# Patient Record
Sex: Female | Born: 1970
Health system: Southern US, Community
[De-identification: ages and names within clinical notes are randomized; demographics above are authoritative.]

## PROBLEM LIST (undated history)

## (undated) DIAGNOSIS — R05 Cough: Secondary | ICD-10-CM

## (undated) DIAGNOSIS — O24419 Gestational diabetes mellitus in pregnancy, unspecified control: Secondary | ICD-10-CM

## (undated) DIAGNOSIS — L509 Urticaria, unspecified: Secondary | ICD-10-CM

## (undated) DIAGNOSIS — N979 Female infertility, unspecified: Secondary | ICD-10-CM

## (undated) DIAGNOSIS — R519 Headache, unspecified: Secondary | ICD-10-CM

## (undated) DIAGNOSIS — Z800 Family history of malignant neoplasm of digestive organs: Secondary | ICD-10-CM

## (undated) DIAGNOSIS — E119 Type 2 diabetes mellitus without complications: Secondary | ICD-10-CM

## (undated) DIAGNOSIS — K589 Irritable bowel syndrome without diarrhea: Secondary | ICD-10-CM

## (undated) DIAGNOSIS — R030 Elevated blood-pressure reading, without diagnosis of hypertension: Secondary | ICD-10-CM

## (undated) DIAGNOSIS — R51 Headache: Secondary | ICD-10-CM

## (undated) DIAGNOSIS — N840 Polyp of corpus uteri: Secondary | ICD-10-CM

## (undated) DIAGNOSIS — R5383 Other fatigue: Secondary | ICD-10-CM

## (undated) DIAGNOSIS — J069 Acute upper respiratory infection, unspecified: Secondary | ICD-10-CM

## (undated) DIAGNOSIS — K219 Gastro-esophageal reflux disease without esophagitis: Secondary | ICD-10-CM

## (undated) DIAGNOSIS — T783XXA Angioneurotic edema, initial encounter: Secondary | ICD-10-CM

## (undated) DIAGNOSIS — E0500 Thyrotoxicosis with diffuse goiter without thyrotoxic crisis or storm: Secondary | ICD-10-CM

## (undated) DIAGNOSIS — T7840XA Allergy, unspecified, initial encounter: Secondary | ICD-10-CM

## (undated) DIAGNOSIS — E785 Hyperlipidemia, unspecified: Secondary | ICD-10-CM

## (undated) DIAGNOSIS — R32 Unspecified urinary incontinence: Secondary | ICD-10-CM

## (undated) DIAGNOSIS — R0602 Shortness of breath: Secondary | ICD-10-CM

## (undated) DIAGNOSIS — G4730 Sleep apnea, unspecified: Secondary | ICD-10-CM

## (undated) DIAGNOSIS — R7303 Prediabetes: Secondary | ICD-10-CM

## (undated) DIAGNOSIS — R011 Cardiac murmur, unspecified: Secondary | ICD-10-CM

## (undated) DIAGNOSIS — Z8042 Family history of malignant neoplasm of prostate: Secondary | ICD-10-CM

## (undated) DIAGNOSIS — E039 Hypothyroidism, unspecified: Secondary | ICD-10-CM

## (undated) DIAGNOSIS — Z803 Family history of malignant neoplasm of breast: Secondary | ICD-10-CM

## (undated) DIAGNOSIS — N939 Abnormal uterine and vaginal bleeding, unspecified: Secondary | ICD-10-CM

## (undated) DIAGNOSIS — C801 Malignant (primary) neoplasm, unspecified: Secondary | ICD-10-CM

## (undated) DIAGNOSIS — R059 Cough, unspecified: Secondary | ICD-10-CM

## (undated) DIAGNOSIS — J349 Unspecified disorder of nose and nasal sinuses: Secondary | ICD-10-CM

## (undated) DIAGNOSIS — R61 Generalized hyperhidrosis: Secondary | ICD-10-CM

## (undated) HISTORY — DX: Malignant (primary) neoplasm, unspecified: C80.1

## (undated) HISTORY — DX: Unspecified disorder of nose and nasal sinuses: J34.9

## (undated) HISTORY — DX: Generalized hyperhidrosis: R61

## (undated) HISTORY — DX: Hyperlipidemia, unspecified: E78.5

## (undated) HISTORY — DX: Cough: R05

## (undated) HISTORY — DX: Elevated blood-pressure reading, without diagnosis of hypertension: R03.0

## (undated) HISTORY — DX: Family history of malignant neoplasm of prostate: Z80.42

## (undated) HISTORY — DX: Other fatigue: R53.83

## (undated) HISTORY — DX: Female infertility, unspecified: N97.9

## (undated) HISTORY — DX: Family history of malignant neoplasm of breast: Z80.3

## (undated) HISTORY — DX: Gastro-esophageal reflux disease without esophagitis: K21.9

## (undated) HISTORY — DX: Angioneurotic edema, initial encounter: T78.3XXA

## (undated) HISTORY — DX: Headache: R51

## (undated) HISTORY — DX: Cough, unspecified: R05.9

## (undated) HISTORY — DX: Family history of malignant neoplasm of digestive organs: Z80.0

## (undated) HISTORY — DX: Unspecified urinary incontinence: R32

## (undated) HISTORY — DX: Type 2 diabetes mellitus without complications: E11.9

## (undated) HISTORY — DX: Shortness of breath: R06.02

## (undated) HISTORY — DX: Headache, unspecified: R51.9

## (undated) HISTORY — DX: Polyp of corpus uteri: N84.0

## (undated) HISTORY — DX: Gestational diabetes mellitus in pregnancy, unspecified control: O24.419

## (undated) HISTORY — PX: LASIK: SHX215

## (undated) HISTORY — DX: Urticaria, unspecified: L50.9

## (undated) HISTORY — DX: Thyrotoxicosis with diffuse goiter without thyrotoxic crisis or storm: E05.00

## (undated) HISTORY — DX: Cardiac murmur, unspecified: R01.1

## (undated) HISTORY — DX: Allergy, unspecified, initial encounter: T78.40XA

## (undated) HISTORY — DX: Acute upper respiratory infection, unspecified: J06.9

## (undated) HISTORY — DX: Irritable bowel syndrome, unspecified: K58.9

---

## 2007-08-04 DIAGNOSIS — L255 Unspecified contact dermatitis due to plants, except food: Secondary | ICD-10-CM | POA: Insufficient documentation

## 2008-04-25 DIAGNOSIS — J45909 Unspecified asthma, uncomplicated: Secondary | ICD-10-CM | POA: Insufficient documentation

## 2010-02-20 ENCOUNTER — Ambulatory Visit: Payer: Self-pay | Admitting: Family Medicine

## 2010-02-27 ENCOUNTER — Encounter: Admission: RE | Admit: 2010-02-27 | Discharge: 2010-02-27 | Payer: Self-pay | Admitting: Family Medicine

## 2010-02-27 IMAGING — US US SOFT TISSUE HEAD/NECK
1 series · 14 of 25 positions shown · non-contrast
Comparison: None.

CLINICAL DATA: Hyperthyroidism

THYROID ULTRASOUND
TECHNIQUE: Ultrasound examination of the thyroid gland and
adjacent soft tissues was performed.

[Series 1: us soft tissue head/neck · 0.09mm/px · 14 of 46 slices shown]
[im 1/46]
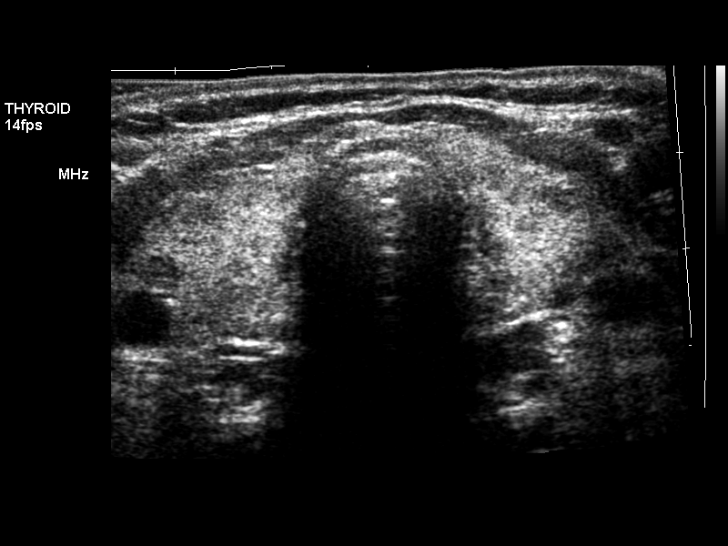
[im 4/46]
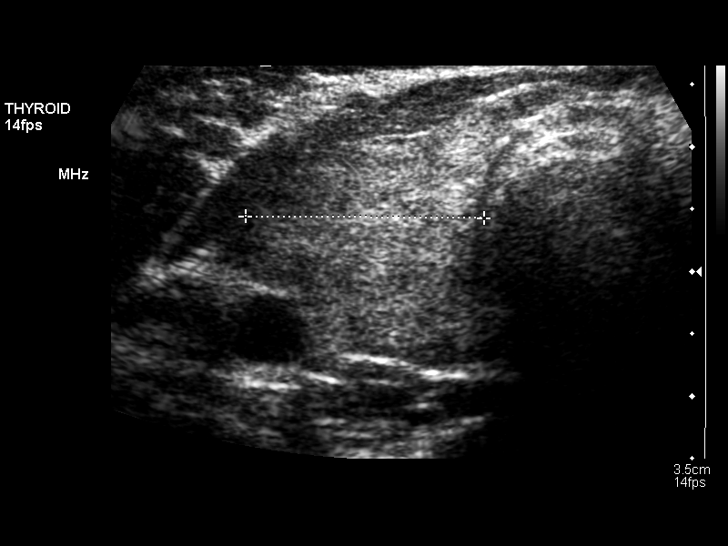
[im 8/46]
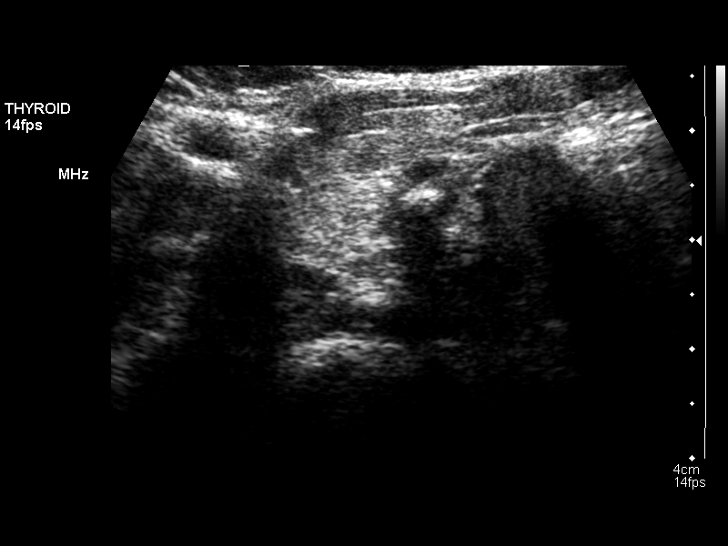
[im 12/46]
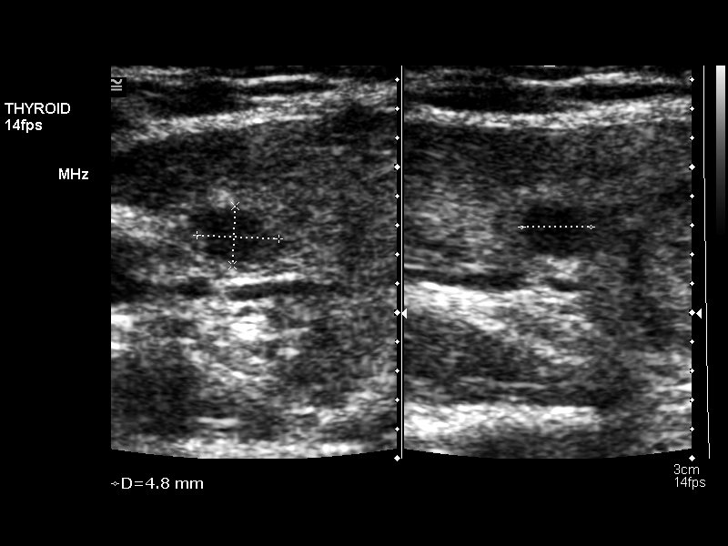
[im 16/46]
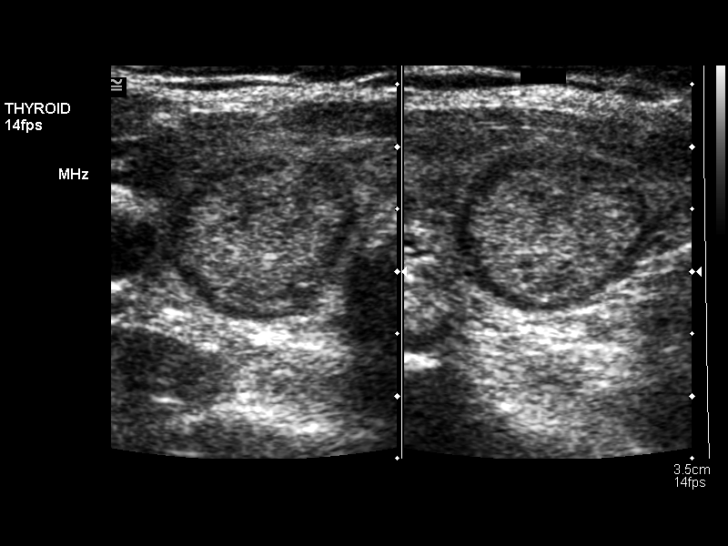
[im 17/46]
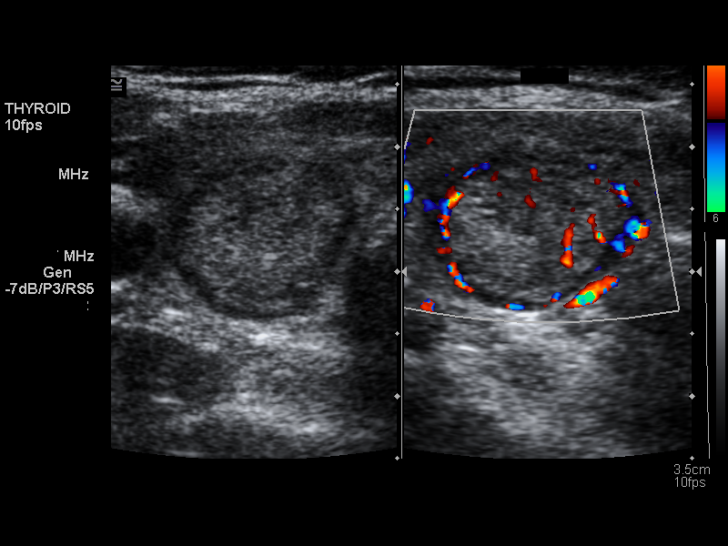
[im 21/46]
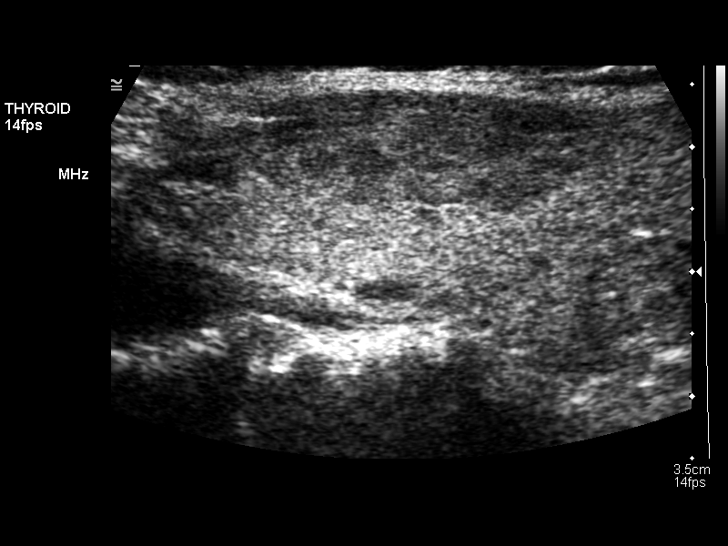
[im 25/46]
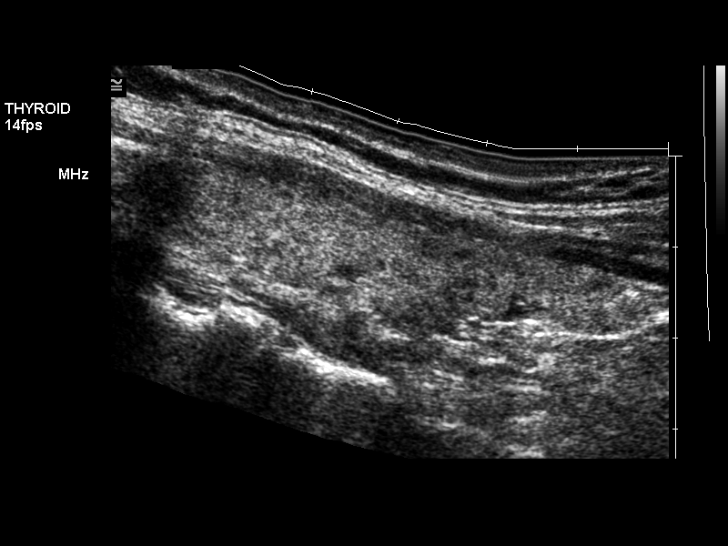
[im 29/46]
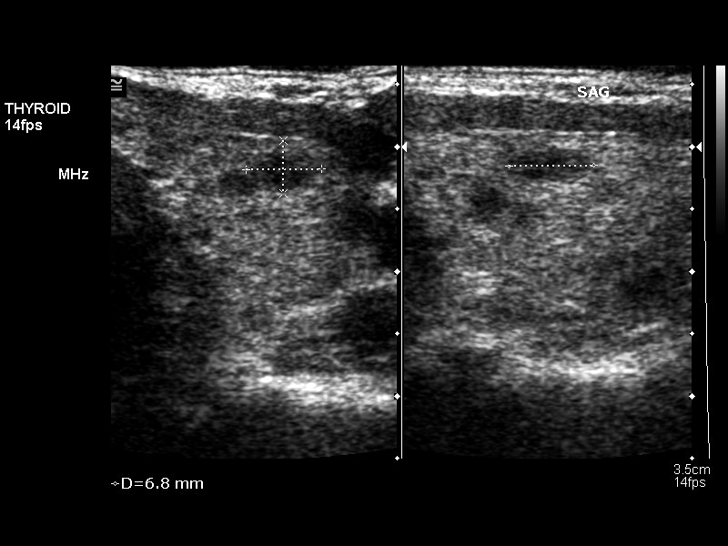
[im 31/46]
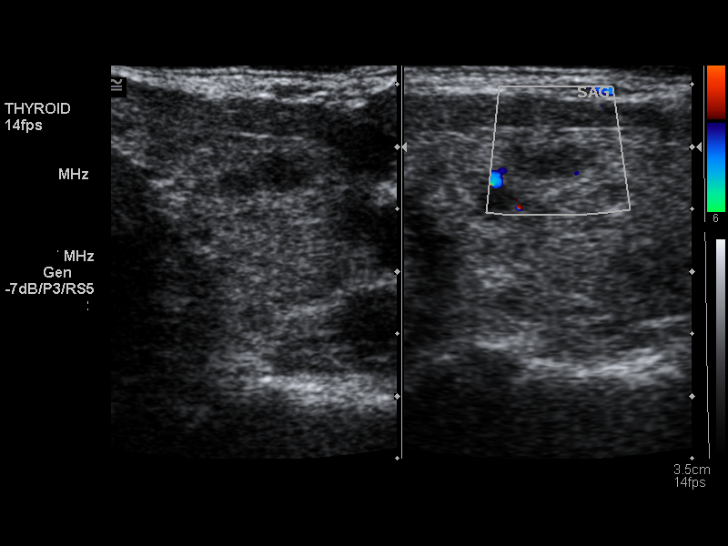
[im 34/46]
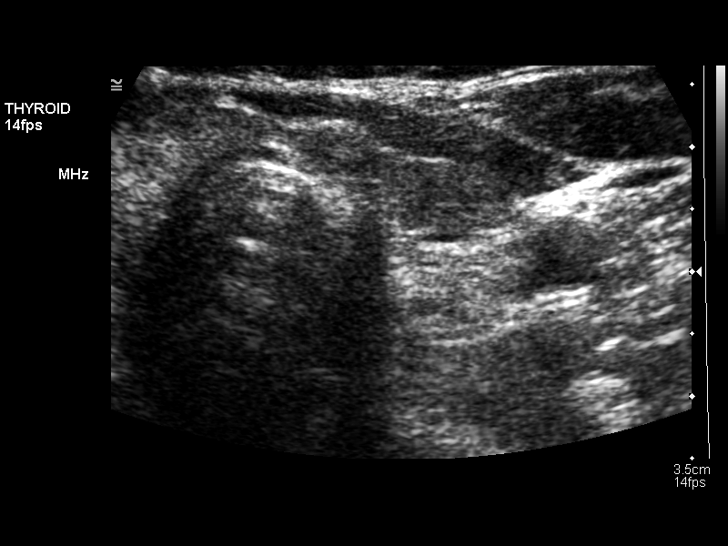
[im 38/46]
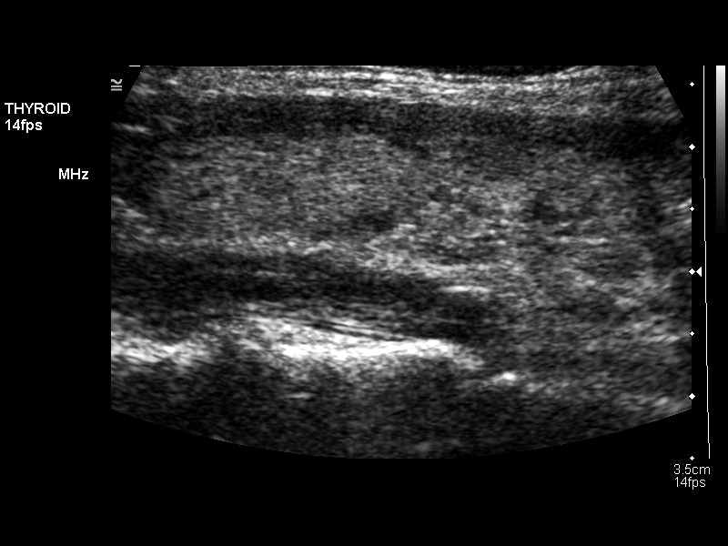
[im 42/46]
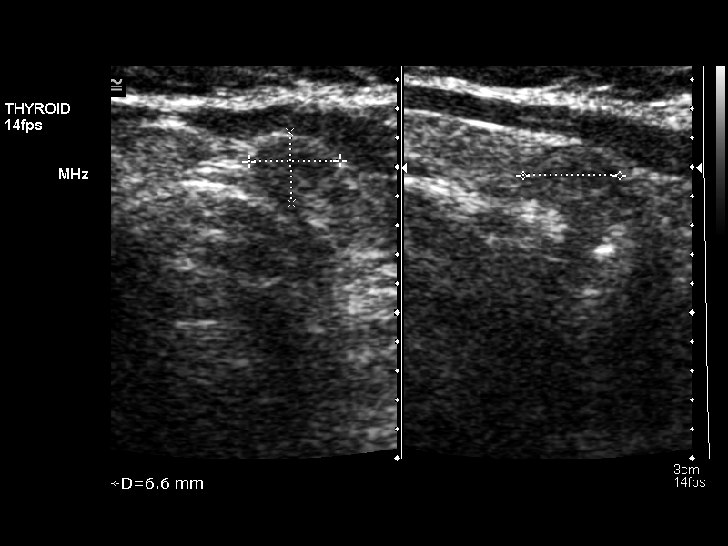
[im 46/46]
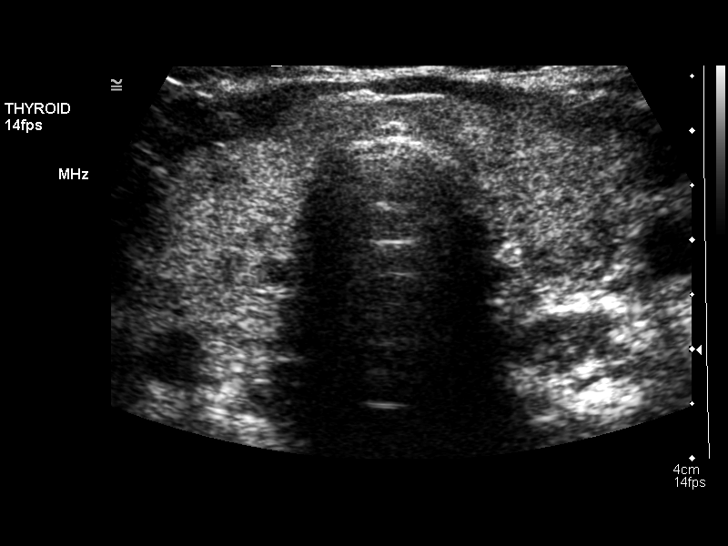

[14 of 25 positions shown; findings below may reference images not displayed]

FINDINGS: The thyroid gland is within normal limits in size.  The
right lobe measures 6.1 cm sagittally, with a depth of 1.5 cm and
width of 1.9 cm.  The left lobe measures 6.1 x 1.3 x 1.5 cm, with
the isthmus measuring 4.5 mm in thickness.  The gland is diffusely
inhomogeneous in echogenicity.

There are nodules bilaterally.  The dominant nodule is solid in the
lower pole of the right lobe measuring 1.6 x 1.4 x 1.6 cm with a
hypoechoic rim.  Smaller solid nodules are noted bilaterally of no
more than 7 mm in maximum diameter.
IMPRESSION: Dominant solid nodule in the lower pole of the right lobe of 1.6 cm
maximum diameter with a hypoechoic rim.  Consider biopsy of this
nodule or close follow-up.  Small nodules bilaterally as noted
above.

## 2010-03-11 ENCOUNTER — Ambulatory Visit: Payer: Self-pay | Admitting: Family Medicine

## 2010-03-30 DIAGNOSIS — E0500 Thyrotoxicosis with diffuse goiter without thyrotoxic crisis or storm: Secondary | ICD-10-CM

## 2010-03-30 HISTORY — DX: Thyrotoxicosis with diffuse goiter without thyrotoxic crisis or storm: E05.00

## 2010-04-22 ENCOUNTER — Encounter (HOSPITAL_COMMUNITY): Admission: RE | Admit: 2010-04-22 | Discharge: 2010-05-27 | Payer: Self-pay | Admitting: Internal Medicine

## 2010-05-01 ENCOUNTER — Encounter: Admission: RE | Admit: 2010-05-01 | Discharge: 2010-05-01 | Payer: Self-pay | Admitting: Internal Medicine

## 2010-05-01 ENCOUNTER — Other Ambulatory Visit: Admission: RE | Admit: 2010-05-01 | Discharge: 2010-05-01 | Payer: Self-pay | Admitting: Interventional Radiology

## 2010-05-01 IMAGING — US US BIOPSY
1 series · 6 of 6 positions shown · non-contrast
Comparison: Ultrasound performed [DATE] revealed a dominant
solid nodule the right lower pole measuring 1.6 cm.

CLINICAL DATA: Hyperthyroidism and thyroid nodule.  Request has
been made for fine needle aspirate.

ULTRASOUND-GUIDED NEEDLE ASPIRATE BIOPSY, RIGHT LOBE OF THYROID
The above procedure was discussed with the patient and written
informed consent was obtained.

[Series 1: us biopsy · 6 acquisitions, 6 frames shown]
[im 1/6]
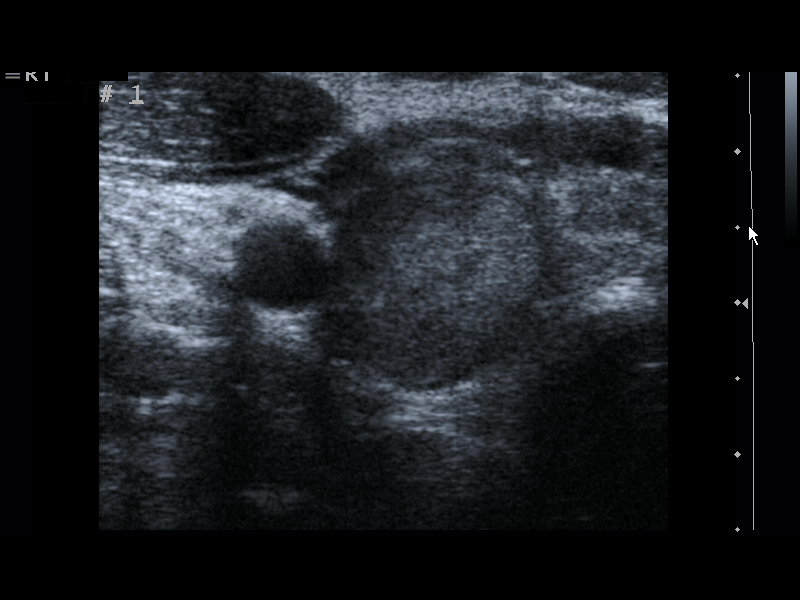
[im 2/6]
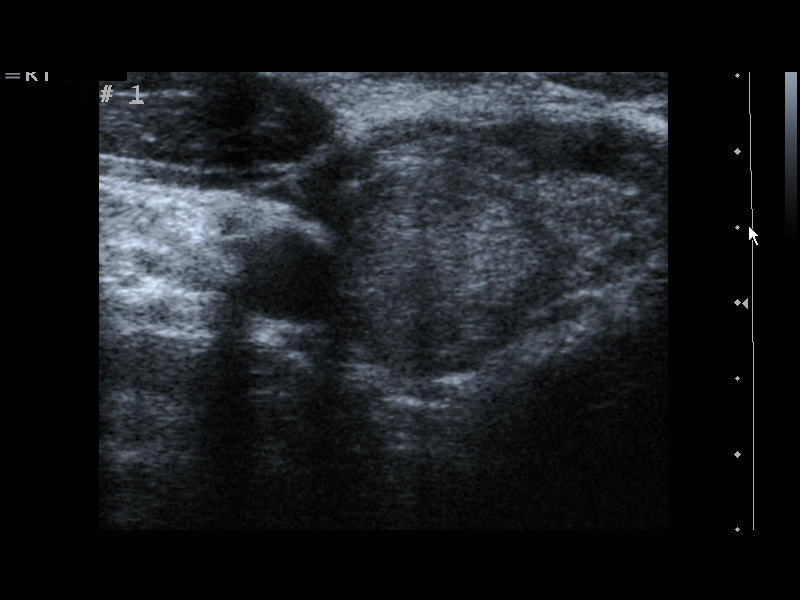
[im 3/6]
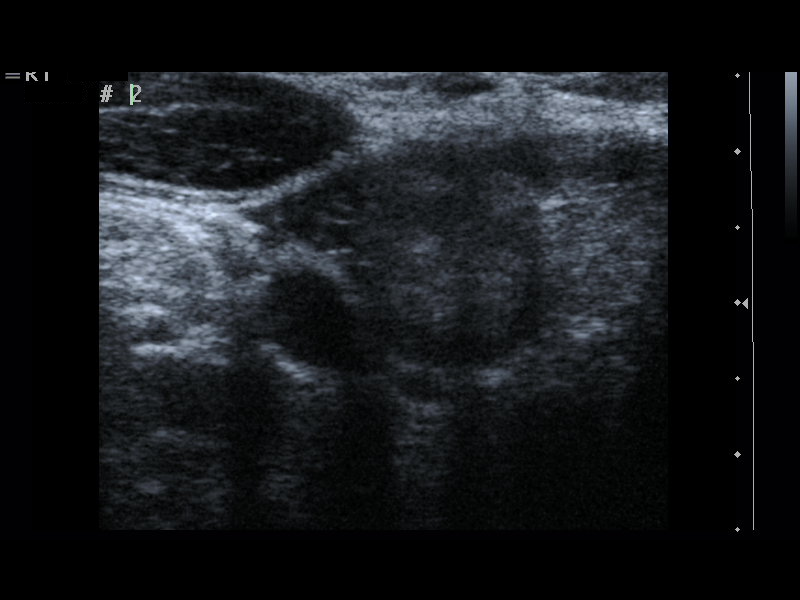
[im 4/6]
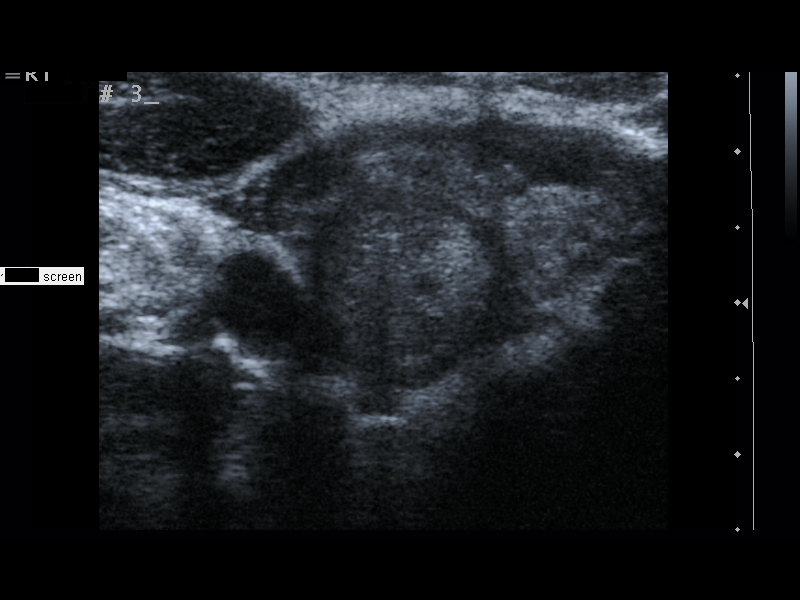
[im 5/6]
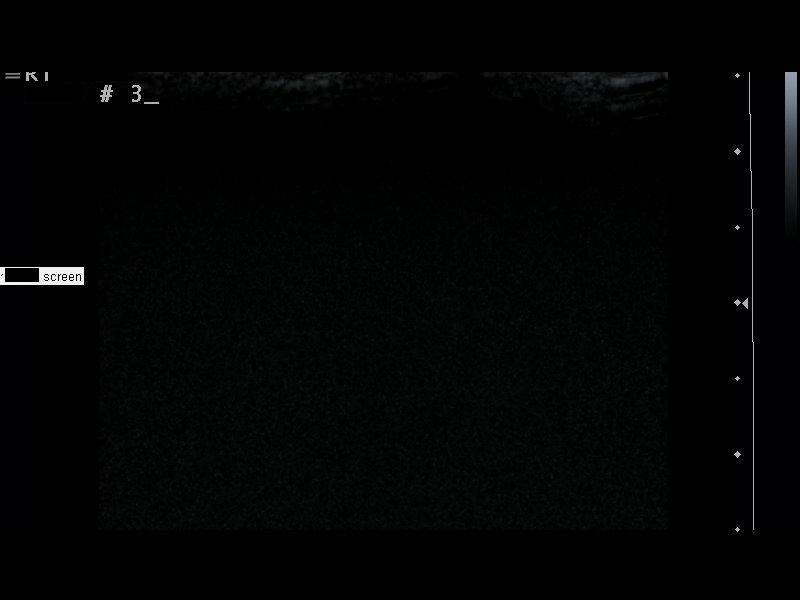
[im 6/6]
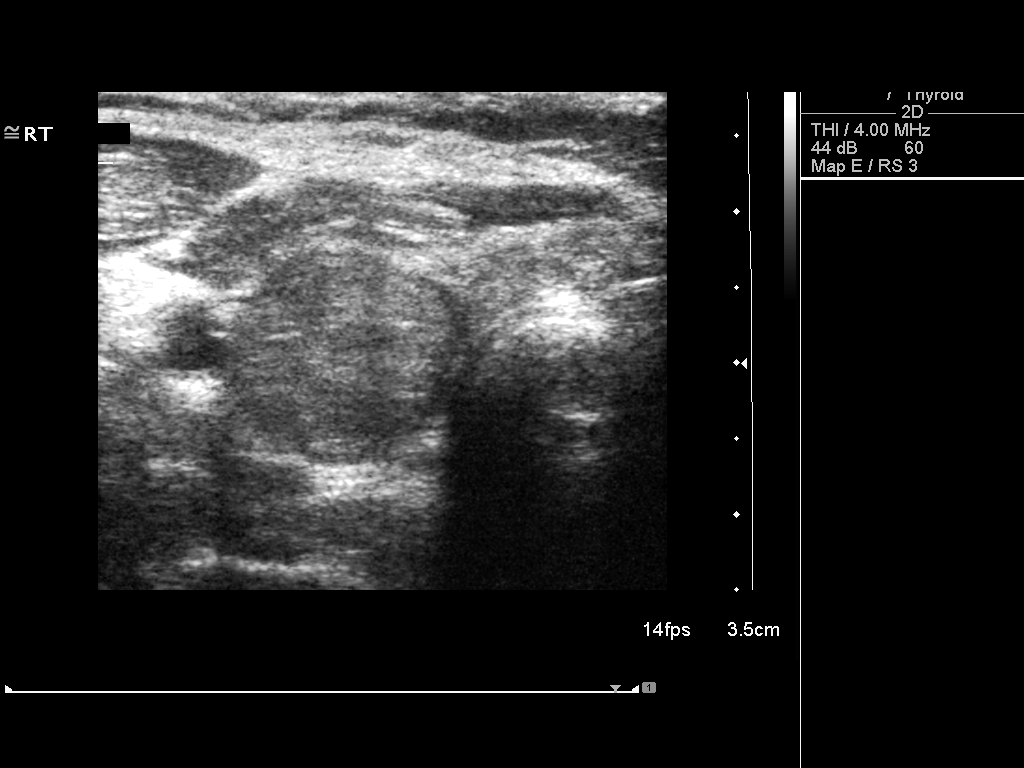

[6 of 6 positions shown; findings below may reference images not displayed]

Nuclear
medicine scan  performed [DATE] revealed cold nodule
corresponding to right thyroid nodule
FINDINGS: Ultrasound was performed to localize and mark an adequate
site for the biopsy.  The patient was then prepped and draped in a
normal sterile fashion.  Local anesthesia was provided with 1%
lidocaine.  Using direct ultrasound guidance, three passes were
made using 25 gauge needles into the nodule within the right lobe
of the thyroid.  Ultrasound was used to confirm needle placements
on all occasions.  Specimens were sent to Pathology for analysis.
Post procedural imaging demonstrated no hematoma or immediate
complication.  The patient tolerated the procedure well.
IMPRESSION: Successful fine needle aspirate of dominant right thyroid nodule
corresponding to cold nodule on nuclear medicine scan as described
above.

Read by: HSMUSIC.-HSMUSIC

## 2010-10-06 ENCOUNTER — Ambulatory Visit: Payer: Self-pay | Admitting: Family Medicine

## 2011-01-01 ENCOUNTER — Ambulatory Visit (INDEPENDENT_AMBULATORY_CARE_PROVIDER_SITE_OTHER): Payer: BC Managed Care – PPO | Admitting: Physician Assistant

## 2011-01-01 DIAGNOSIS — J309 Allergic rhinitis, unspecified: Secondary | ICD-10-CM

## 2011-01-01 DIAGNOSIS — J0100 Acute maxillary sinusitis, unspecified: Secondary | ICD-10-CM

## 2011-06-26 ENCOUNTER — Encounter (INDEPENDENT_AMBULATORY_CARE_PROVIDER_SITE_OTHER): Payer: Self-pay | Admitting: Surgery

## 2011-06-29 ENCOUNTER — Encounter (INDEPENDENT_AMBULATORY_CARE_PROVIDER_SITE_OTHER): Payer: Self-pay | Admitting: Surgery

## 2011-06-29 ENCOUNTER — Ambulatory Visit (INDEPENDENT_AMBULATORY_CARE_PROVIDER_SITE_OTHER): Payer: BC Managed Care – PPO | Admitting: Surgery

## 2011-06-29 DIAGNOSIS — E0590 Thyrotoxicosis, unspecified without thyrotoxic crisis or storm: Secondary | ICD-10-CM

## 2011-06-29 DIAGNOSIS — E041 Nontoxic single thyroid nodule: Secondary | ICD-10-CM | POA: Insufficient documentation

## 2011-06-29 NOTE — Progress Notes (Signed)
Chief Complaint  Patient presents with  . Other    eval of hyperthyroidism    HISTORY: Patient is a 40 year old white female. She first developed symptoms in April 2011. She had a syncopal episode. She was seen by her primary physician. Laboratory studies indicated hyperthyroidism. Patient was referred to Dr. Talmage Coin. She underwent thyroid ultrasound and biopsy which by report was negative and she was placed on methimazole and beta blockade in August 2011. Patient has had significant fluctuations in her levels. She is currently on a reduced dose of methimazole.  Patient has noted significant fatigue. She has had problems with nocturnal shortness of breath. She has had occasional choking episodes. She has frequent episodes of dysphagia and is careful with her meals.  Patient has a family history of thyroid disease in a paternal grandmother. No family history of thyroid cancer. No other family history of endocrine disease.  Patient has had no prior head or neck surgery.   Past Medical History  Diagnosis Date  . Diabetes mellitus arising in pregnancy   . Migraine   . IBS (irritable bowel syndrome)   . Graves disease   . Fatigue   . Night sweats   . Hyperlipidemia   . Asthma   . SOB (shortness of breath)   . Cough   . IBS (irritable bowel syndrome)   . Generalized headaches     migraines on occasion.  . Incontinence   . Sinus problem     runny nose  . Family history of breast cancer     MGM     Current outpatient prescriptions:fexofenadine (ALLEGRA) 180 MG tablet, Take 180 mg by mouth daily.  , Disp: , Rfl: ;  IBUPROFEN PO, Take by mouth as needed.  , Disp: , Rfl: ;  methimazole (TAPAZOLE) 10 MG tablet, Take 10 mg by mouth 3 (three) times daily.  , Disp: , Rfl: ;  propranolol (INDERAL) 80 MG tablet, Take 80 mg by mouth 3 (three) times daily.  , Disp: , Rfl:    No Known Allergies   History reviewed. No pertinent family history.   History  Substance Use Topics  .  Smoking status: Never Smoker   . Smokeless tobacco: Not on file  . Alcohol Use: Yes     once per week     PERTINENT POSITIVES FROM REVIEW OF SYSTEMS: Patient complains of nocturnal shortness of breath on occasion. Patient notes choking sensation on occasion. Patient does note frequent dysphagia. She denies tremors. She did experience tremors prior to taking methimazole. She denies palpitations.   EXAM: Filed Vitals:   06/29/11 0941  BP: 122/84  Pulse: 60  Temp: 97 F (36.1 C)   HEENT shows her to be normocephalic. Sclerae clear. Pupils equal and reactive. Dentition good. Mucous membranes moist. Voice is normal. Neck shows an obvious enlarged right thyroid lobe with the neck extended. This is mobile with swallowing. It is mildly tender. Right lobe is quite firm. There appears to be a large dominant nodule occupying most of the right lobe. Isthmus is thickened. Left lobe is firm but only slightly above normal size. No anterior or posterior cervical lymphadenopathy. No supraclavicular masses. Chest is clear to auscultation bilaterally without rales rhonchi or wheeze Cardiac exam shows regular rate and rhythm without significant murmur Extremities are nontender without edema. No tenderness. Neurologically the patient is alert and oriented without focal neurologic deficits. There is no sign of tremor.   LABORATORY RESULTS: See E-Chart for most recent results  RADIOLOGY RESULTS: See E-Chart or I-Site for most recent results   IMPRESSION: #1-hyperthyroidism, controlled on methimazole and beta blockade #2-nodular thyroid with moderate compressive symptoms   PLAN: The patient and I had a lengthy conversation regarding the above issues. We reviewed her history over the past year. We reviewed the results of her studies. We discussed total thyroidectomy as the procedure of choice if she opts for surgery. We discussed potential complications including recurrent nerve injury and injury to  parathyroid glands. We discussed the location of the surgical incision. We discussed the hospital stay. We discussed the need for lifelong thyroid hormone replacement. We discussed her recuperation and returned to work and activities. She understands.  Patient is scheduled to see Dr. Talmage Coin next week. She will again discuss the surgical option with him. If she decides to proceed with surgery, she will contact our office to schedule her procedure at [facility].  The risks and benefits of the procedure have been discussed at length with the patient.  The patient understands the proposed procedure, potential alternative treatments, and the course of recovery to be expected.  All of the patient's questions have been answered at this time.  The patient wishes to proceed with surgery and will schedule a date for their procedure through our office staff.

## 2011-06-29 NOTE — Patient Instructions (Signed)
See Dr. Sharl Ma for follow-up.  Contact our office if you desire to proceed with surgery. TMG

## 2011-07-23 ENCOUNTER — Other Ambulatory Visit (INDEPENDENT_AMBULATORY_CARE_PROVIDER_SITE_OTHER): Payer: Self-pay | Admitting: Surgery

## 2011-07-23 ENCOUNTER — Ambulatory Visit (HOSPITAL_COMMUNITY)
Admission: RE | Admit: 2011-07-23 | Discharge: 2011-07-23 | Disposition: A | Discharge status: Home / Self Care | Payer: BC Managed Care – PPO | Source: Ambulatory Visit | Attending: Surgery | Admitting: Surgery

## 2011-07-23 ENCOUNTER — Encounter (HOSPITAL_COMMUNITY): Payer: BC Managed Care – PPO

## 2011-07-23 DIAGNOSIS — Z01810 Encounter for preprocedural cardiovascular examination: Secondary | ICD-10-CM | POA: Insufficient documentation

## 2011-07-23 DIAGNOSIS — Z01818 Encounter for other preprocedural examination: Secondary | ICD-10-CM | POA: Insufficient documentation

## 2011-07-23 DIAGNOSIS — Z01812 Encounter for preprocedural laboratory examination: Secondary | ICD-10-CM | POA: Insufficient documentation

## 2011-07-23 LAB — CBC
HCT: 40.7 % (ref 36.0–46.0)
MCH: 30 pg (ref 26.0–34.0)
MCHC: 33.7 g/dL (ref 30.0–36.0)
MCV: 89.1 fL (ref 78.0–100.0)
RBC: 4.57 MIL/uL (ref 3.87–5.11)
RDW: 12.4 % (ref 11.5–15.5)

## 2011-07-23 LAB — DIFFERENTIAL
Lymphs Abs: 2.6 10*3/uL (ref 0.7–4.0)
Neutrophils Relative %: 61 % (ref 43–77)

## 2011-07-23 LAB — BASIC METABOLIC PANEL
Calcium: 9.5 mg/dL (ref 8.4–10.5)
Chloride: 103 mEq/L (ref 96–112)
Glucose, Bld: 132 mg/dL — ABNORMAL HIGH (ref 70–99)
Sodium: 137 mEq/L (ref 135–145)

## 2011-07-23 LAB — URINALYSIS, ROUTINE W REFLEX MICROSCOPIC
Leukocytes, UA: NEGATIVE
Nitrite: NEGATIVE
Urobilinogen, UA: 1 mg/dL (ref 0.0–1.0)
pH: 5.5 (ref 5.0–8.0)

## 2011-07-23 LAB — PROTIME-INR: Prothrombin Time: 13.8 seconds (ref 11.6–15.2)

## 2011-07-23 LAB — HCG, SERUM, QUALITATIVE: Preg, Serum: NEGATIVE

## 2011-07-23 IMAGING — CR DG CHEST 2V
2 series · 2 of 2 positions shown · non-contrast
Comparison: None.

CLINICAL DATA: Preop respiratory examination for total
thyroidectomy.

CHEST - 2 VIEW

[w chest pa]
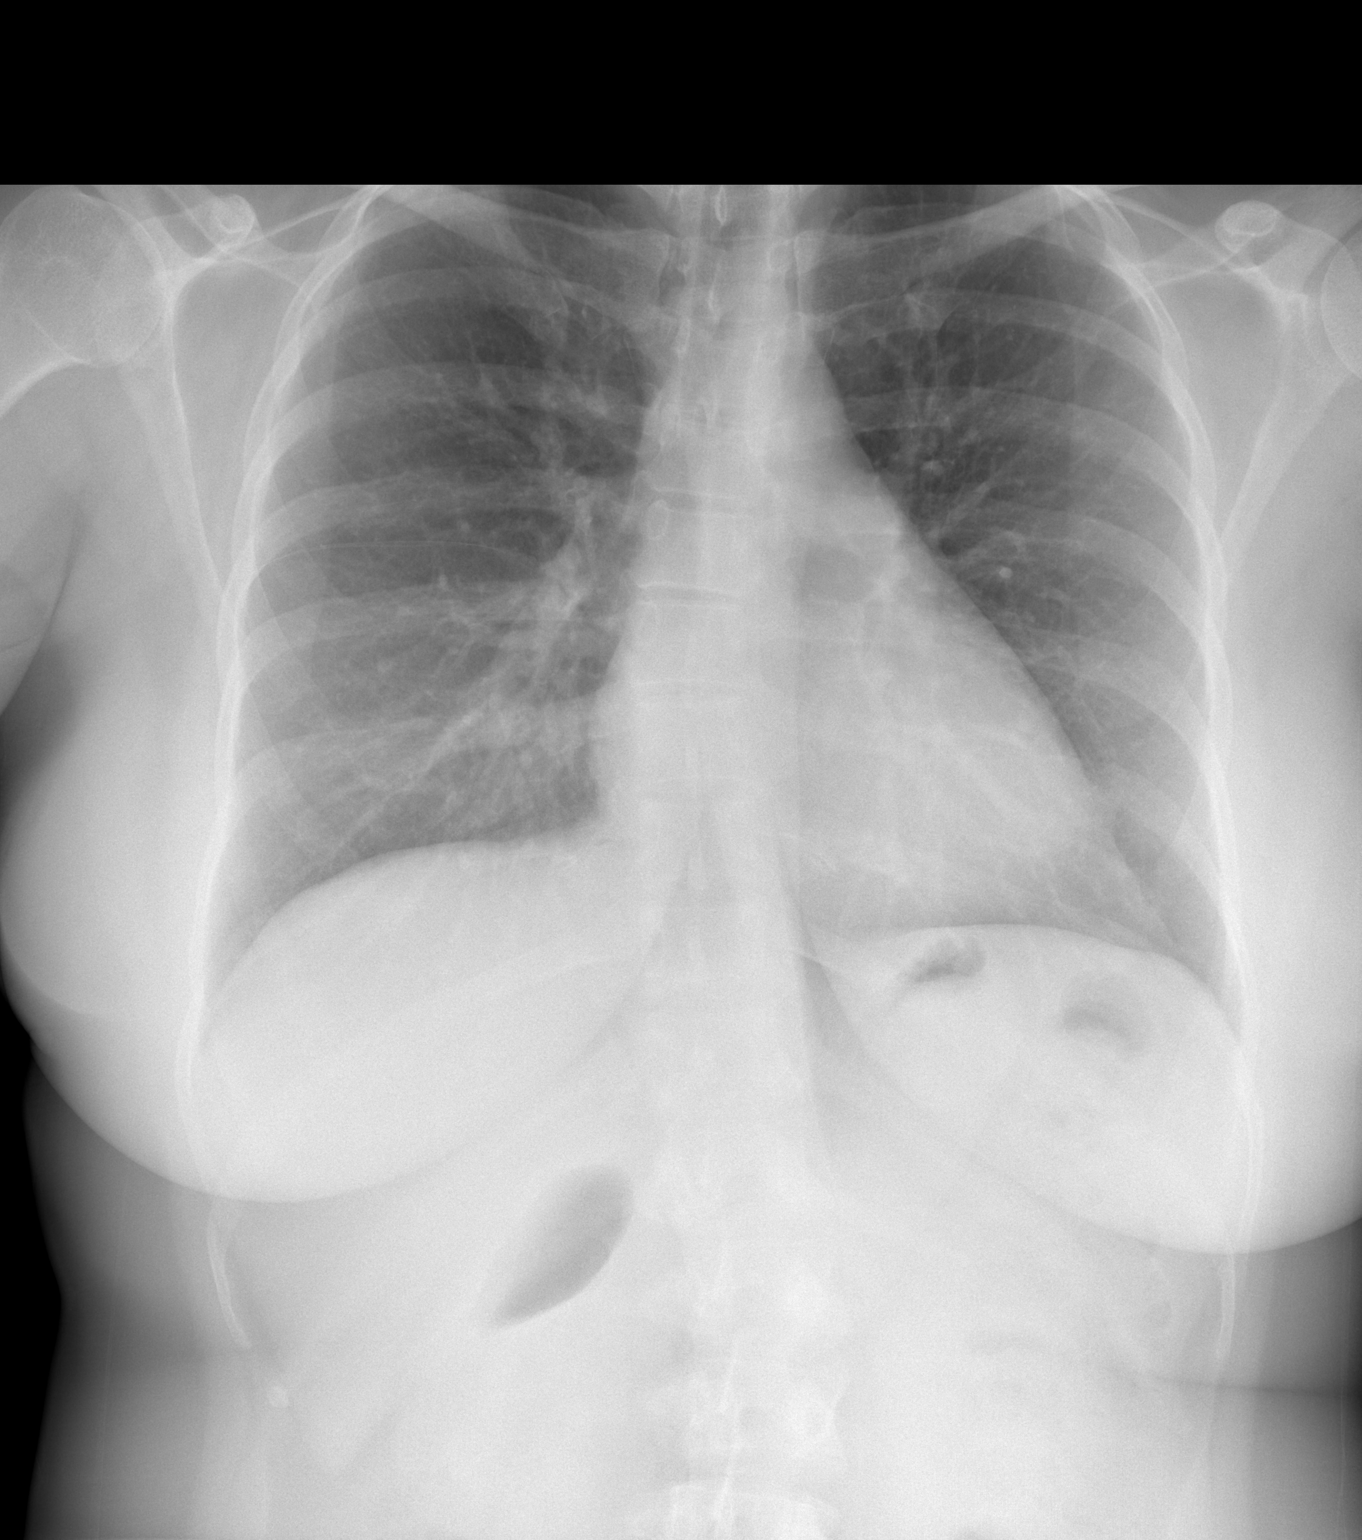

[w chest lat]
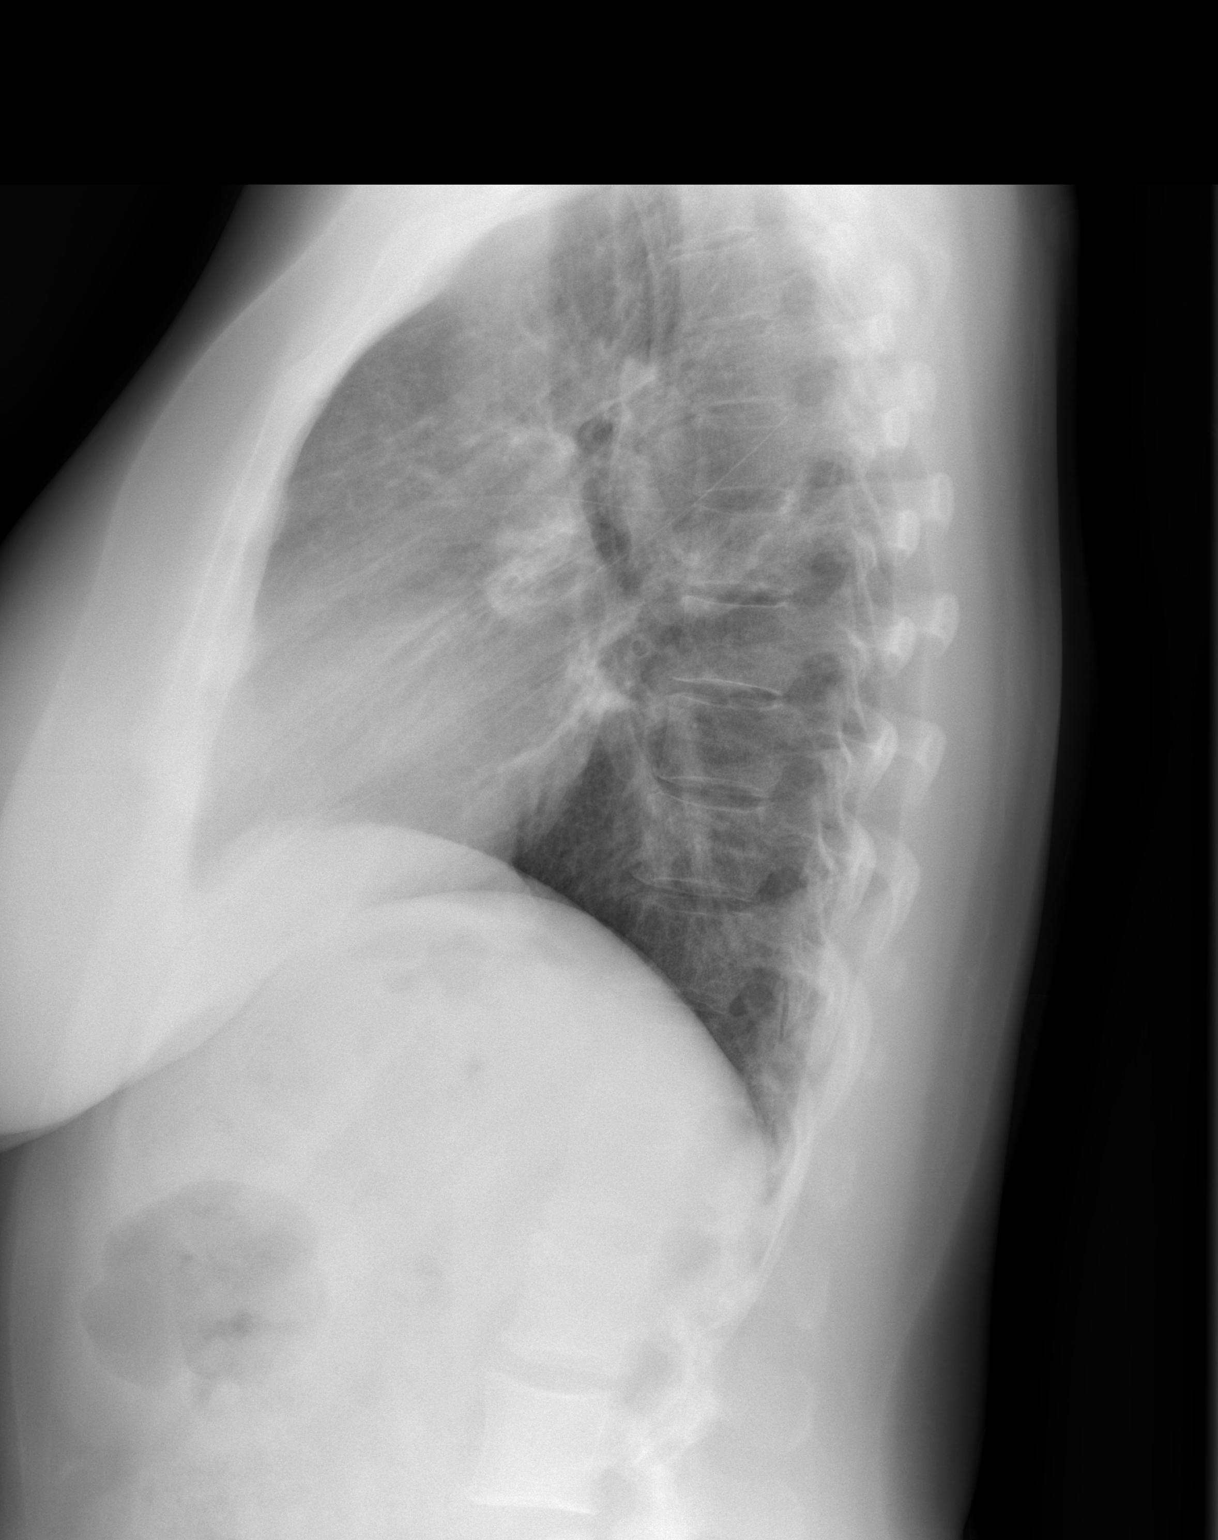

[2 of 2 positions shown; findings below may reference images not displayed]

FINDINGS: The cardiac silhouette, mediastinal and hilar contours
are within normal limits.  The lungs are clear.  The bony thorax is
intact.
IMPRESSION: No acute cardiopulmonary findings.

## 2011-07-27 ENCOUNTER — Telehealth (INDEPENDENT_AMBULATORY_CARE_PROVIDER_SITE_OTHER): Payer: Self-pay | Admitting: Surgery

## 2011-07-27 NOTE — Telephone Encounter (Signed)
pls ask Dr Gerrit Friends to sign FMLA papers for pt......Liborio Nixon

## 2011-07-27 NOTE — Telephone Encounter (Signed)
pls ask Dr Gerrit Friends to sign FMLA papers for pt today 07-27-11.Marland KitchenMarland KitchenLiborio Nixon

## 2011-07-27 NOTE — Telephone Encounter (Signed)
Paper work signed and to [employer] to fax last week.  RMP

## 2011-07-30 ENCOUNTER — Other Ambulatory Visit (INDEPENDENT_AMBULATORY_CARE_PROVIDER_SITE_OTHER): Payer: Self-pay | Admitting: Surgery

## 2011-07-30 ENCOUNTER — Ambulatory Visit (HOSPITAL_COMMUNITY)
Admission: RE | Admit: 2011-07-30 | Discharge: 2011-07-31 | Disposition: A | Discharge status: Home / Self Care | Payer: BC Managed Care – PPO | Source: Ambulatory Visit | Attending: Surgery | Admitting: Surgery

## 2011-07-30 DIAGNOSIS — Z01818 Encounter for other preprocedural examination: Secondary | ICD-10-CM | POA: Insufficient documentation

## 2011-07-30 DIAGNOSIS — Z01812 Encounter for preprocedural laboratory examination: Secondary | ICD-10-CM | POA: Insufficient documentation

## 2011-07-30 DIAGNOSIS — I10 Essential (primary) hypertension: Secondary | ICD-10-CM | POA: Insufficient documentation

## 2011-07-30 DIAGNOSIS — E063 Autoimmune thyroiditis: Secondary | ICD-10-CM

## 2011-07-30 DIAGNOSIS — Z01810 Encounter for preprocedural cardiovascular examination: Secondary | ICD-10-CM | POA: Insufficient documentation

## 2011-07-30 DIAGNOSIS — D34 Benign neoplasm of thyroid gland: Secondary | ICD-10-CM | POA: Insufficient documentation

## 2011-07-30 DIAGNOSIS — E0520 Thyrotoxicosis with toxic multinodular goiter without thyrotoxic crisis or storm: Secondary | ICD-10-CM | POA: Insufficient documentation

## 2011-07-30 HISTORY — PX: TOTAL THYROIDECTOMY: SHX2547

## 2011-07-31 LAB — CALCIUM: Calcium: 8 mg/dL — ABNORMAL LOW (ref 8.4–10.5)

## 2011-08-04 NOTE — Progress Notes (Signed)
Quick Note:  Please contact patient with benign path results. TMG ______ 

## 2011-08-06 NOTE — Progress Notes (Signed)
Patient aware of results. RMP

## 2011-08-06 NOTE — Discharge Summary (Signed)
  [name], [name] NO.:  [id number]  MEDICAL RECORD NO.:  [id number]  LOCATION:  1529                         FACILITY:  [facility]  PHYSICIAN:  Velora Heckler, MD      DATE OF BIRTH:  Aug 09, 1971  DATE OF ADMISSION:  07/30/2011 DATE OF DISCHARGE:  07/31/2011                              DISCHARGE SUMMARY   REASON FOR ADMISSION:  Hyperthyroidism, thyroid nodules.  BRIEF HISTORY:  The patient is a 40 year old female diagnosed in the spring 2011 with hyperthyroidism.  The patient was found to have thyroid nodules on ultrasound.  She was treated with methimazole and beta- blockade.  She has been followed closely by her endocrinologist.  She now comes to Surgery for thyroidectomy for management of thyroid nodules with mild compressive symptoms and hyperthyroidism.  HOSPITAL COURSE:  The patient was admitted on July 30, 2011.  She was taken directly to the operating room where she underwent total thyroidectomy.  Postoperative course was notable for a decreased serum calcium level of 8.0.  She received intravenous supplements and oral supplements.  She was prepared for discharge home on the first postoperative day.  DISCHARGE PLANNING:  The patient is discharged home today, July 31, 2011, in good condition, tolerating a regular diet, and ambulating independently.  DISCHARGE MEDICATIONS:  Include, 1. Vicodin as needed for pain. 2. Propranolol dosage is decreased to 40 mg daily. 3. Methimazole is discontinued. 4. The patient will take calcium supplements four times daily.  She will be seen back in my office in 2-3 weeks with a calcium level drawn prior to her office visit.  She will contact her endocrinologist, Dr. Tonita Cong, regarding the dosage and starting date for her thyroid hormone replacement.  FINAL DIAGNOSIS:  Multinodular toxic goiter.  CONDITION AT DISCHARGE:  Good.     Velora Heckler, MD     TMG/MEDQ  D:  07/31/2011  T:  07/31/2011   Job:  [id number]  cc:   Tonita Cong, M.D. Fax: [phone]  Electronically Signed by Darnell Level MD on 08/06/2011 11:31:26 AM

## 2011-08-06 NOTE — Op Note (Signed)
Jacqueline, Holmes NO.:  [id number]  MEDICAL RECORD NO.:  [id number]  LOCATION:  1529                         FACILITY:  [facility]  PHYSICIAN:  Velora Heckler, MD      DATE OF BIRTH:  [birth year]  DATE OF PROCEDURE:  07/30/2011                               OPERATIVE REPORT   PREOPERATIVE DIAGNOSES:  Hyperthyroidism, thyroid nodules.  POSTOPERATIVE DIAGNOSES:  Hyperthyroidism, thyroid nodules.  PROCEDURE:  Total thyroidectomy.  SURGEON:  Velora Heckler, MD, FACS  ASSISTANT:  Adolph Pollack, M.D.  ANESTHESIA:  General per Dr. Eilene Ghazi.  ESTIMATED BLOOD LOSS:  Minimal.  PREPARATION:  ChloraPrep.  COMPLICATIONS:  None.  INDICATIONS:  The patient is a 40 year old white female who was noted with laboratory studies to have hyperthyroidism.  Thyroid ultrasound showed thyroid nodules.  Fine-needle aspiration biopsy was performed with benign cytopathology.  The patient was placed on methimazole and beta blockade.  She now comes to surgery for thyroidectomy for management of hyperthyroidism and nodular thyroid.  DESCRIPTION OF PROCEDURE:  Procedure was done in OR #1 at the [facility].  The patient was brought to the operating room, placed in supine position on the operating room table.  Following administration of general anesthesia, the patient was positioned and then prepped and draped in usual strict aseptic fashion.  After ascertaining that an adequate level of anesthesia been achieved, a Kocher incision was made a #15 blade.  Dissection is carried down through subcutaneous tissues and platysma.  Hemostasis was obtained with electrocautery.  Skin flaps were elevated cephalad and caudad from the thyroid notch to the sternal notch.  A Mahorner self-retaining retractor was placed for exposure.  Strap muscles were incised in the midline. Dissection was begun on the left side.  Left thyroid lobe was gently mobilized.  Venous  tributaries were divided between Ligaclips with the Harmonic scalpel.  Gland was dissected out.  Superior pole was mobilized.  Superior pole vessels were divided between medium Ligaclips with the Harmonic scalpel.  Gland was rolled anteriorly.  Superior parathyroid gland was identified and preserved.  Branches of the inferior thyroid artery were divided between small and medium Ligaclips with the Harmonic scalpel.  Inferior venous tributaries were divided between small Ligaclips with the Harmonic scalpel.  Recurrent laryngeal nerve was identified and preserved.  Ligament of Allyson Sabal was released with electrocautery and the gland was mobilized up and onto the anterior trachea.  Isthmus was mobilized across the midline.  A moderate-sized pyramidal lobe was dissected off the thyroid cartilage and resected with the isthmus en bloc.  A dry pack is placed in the left neck.  We turned our attention to the right side.  Right lobe was somewhat larger than the left.  Strap muscles were again reflected laterally. Lobe was gently mobilized.  Superior pole was dissected out.  Superior pole vessels were divided individually between small and medium Ligaclips with the Harmonic scalpel.  Gland was again rotated anteriorly.  Venous tributaries were divided between Ligaclips. Branches of the inferior thyroid artery were dissected out and divided between small Ligaclips with the Harmonic scalpel.  Recurrent nerve was identified and preserved.  Ligament  of Allyson Sabal was released with the electrocautery.  Inferior venous tributaries were divided between Ligaclips with the Harmonic scalpel.  Remainder of the gland was resected off the anterior trachea with the Harmonic scalpel.  Sutures used to mark the right superior pole.  The entire thyroid gland is submitted to pathology for review.  Neck is irrigated bilaterally with warm saline.  Good hemostasis was achieved.  Surgicel was placed in the operative field  bilaterally. Strap muscles are closed in the midline with interrupted 3-0 Vicryl sutures.  Platysma was reapproximated with interrupted 3-0 Vicryl sutures.  Skin was closed with a running 4-0 Monocryl subcuticular suture.  Wound was washed and dried and Benzoin and Steri-Strips are applied.  Sterile dressings were applied.  The patient is awakened from anesthesia and brought to the recovery room.  The patient tolerated the procedure well.   Velora Heckler, MD, FACS     TMG/MEDQ  D:  07/30/2011  T:  07/30/2011  Job:  [id number]  cc:   Tonita Cong, M.D. Fax: [phone]  Electronically Signed by Darnell Level MD on 08/06/2011 11:31:22 AM

## 2011-08-12 ENCOUNTER — Encounter (INDEPENDENT_AMBULATORY_CARE_PROVIDER_SITE_OTHER): Payer: Self-pay

## 2011-08-14 ENCOUNTER — Other Ambulatory Visit (INDEPENDENT_AMBULATORY_CARE_PROVIDER_SITE_OTHER): Payer: Self-pay | Admitting: Surgery

## 2011-08-14 LAB — CALCIUM: Calcium: 8.8 mg/dL (ref 8.4–10.5)

## 2011-08-19 ENCOUNTER — Encounter (INDEPENDENT_AMBULATORY_CARE_PROVIDER_SITE_OTHER): Payer: Self-pay | Admitting: Surgery

## 2011-08-19 ENCOUNTER — Ambulatory Visit (INDEPENDENT_AMBULATORY_CARE_PROVIDER_SITE_OTHER): Payer: Self-pay | Admitting: Surgery

## 2011-08-19 DIAGNOSIS — E0590 Thyrotoxicosis, unspecified without thyrotoxic crisis or storm: Secondary | ICD-10-CM

## 2011-08-19 DIAGNOSIS — E041 Nontoxic single thyroid nodule: Secondary | ICD-10-CM

## 2011-08-19 NOTE — Progress Notes (Signed)
Visit Diagnoses: 1. Hyperthyroidism   2. Thyroid nodule, uninodular     HISTORY: Patient returns for first postoperative wound check having undergone total thyroidectomy. Final pathology shows a follicular adenoma with multiple adenomatous nodules and a background of lymphocytic thyroiditis. No malignancy was identified. 2 parathyroid glands were noted to be intrathyroidal.   EXAM: Surgical wound is healed nicely with a good cosmetic result. No complications. Voice quality is normal.   IMPRESSION: Status post total thyroidectomy for multinodular thyroid with hyperthyroidism, benign final pathology   PLAN: Patient will begin applying topical creams or incision. She will go today for a serum calcium level at the laboratory. She will return to see me in 6 weeks for a final wound check.  Patient has decided to see an endocrinologist in [place]. She is taking natural thyroid hormone under his direction and he will be monitoring her blood levels  The patient will return in 6 weeks for final wound check.  Velora Heckler, MD, FACS General & Endocrine Surgery [facility]

## 2011-08-19 NOTE — Patient Instructions (Signed)
  COCOA BUTTER & VITAMIN E CREAM  (Palmer's or other brand)  Apply cocoa butter/vitamin E cream to your incision 2 - 3 times daily.  Massage cream into incision for one minute with each application.  Use sunscreen (50 SPF or higher) for first 6 months after surgery.  You may substitute Mederma or other scar reducing creams as desired.   

## 2011-09-10 ENCOUNTER — Encounter: Payer: Self-pay | Admitting: Family Medicine

## 2011-09-10 ENCOUNTER — Ambulatory Visit (INDEPENDENT_AMBULATORY_CARE_PROVIDER_SITE_OTHER): Payer: BC Managed Care – PPO | Admitting: Family Medicine

## 2011-09-10 VITALS — BP 140/86 | HR 72 | Temp 98.6°F | Ht 61.5 in | Wt 168.0 lb

## 2011-09-10 DIAGNOSIS — E039 Hypothyroidism, unspecified: Secondary | ICD-10-CM | POA: Insufficient documentation

## 2011-09-10 DIAGNOSIS — J309 Allergic rhinitis, unspecified: Secondary | ICD-10-CM | POA: Insufficient documentation

## 2011-09-10 DIAGNOSIS — J329 Chronic sinusitis, unspecified: Secondary | ICD-10-CM

## 2011-09-10 DIAGNOSIS — J3089 Other allergic rhinitis: Secondary | ICD-10-CM | POA: Insufficient documentation

## 2011-09-10 MED ORDER — AMOXICILLIN-POT CLAVULANATE 875-125 MG PO TABS: 1.0000 | ORAL_TABLET | Freq: Two times a day (BID) | ORAL | Status: AC

## 2011-09-10 NOTE — Progress Notes (Signed)
Chief complaint:  sinus pain x 1 week. Runny nose/congestion x 2-3 weeks. No coughing. Pt will get flu vaccine at work [employer]).  HPI:  Had some sniffling and allergy symptoms for 2 weeks.  Began with sinus pain R cheek (and sometimes L), and above left eye last week.  Using an OTC decongestant (not helping).  Using sinus rinses and not getting any results.  Blows nose and getting clearish yellow (and sometimes bloody) mucus.  Denies PND, sore throat or cough.  Used to use Flonase, but then seemed to cause increased congestion and some nose bleeds, so stopped it  Recalls that z-pak's dont' usually work well for her.  Last infection was 12/2010 and was treated with Augmentin  Past Medical History  Diagnosis Date  . Diabetes mellitus arising in pregnancy   . Migraine   . IBS (irritable bowel syndrome)   . Graves disease   . Fatigue   . Night sweats   . Hyperlipidemia   . Asthma   . SOB (shortness of breath)   . Cough   . Generalized headaches     migraines on occasion.  . Incontinence   . Sinus problem     runny nose  . Family history of breast cancer     MGM    Past Surgical History  Procedure Date  . Cesarean section   . Lasik   . Total thyroidectomy 07/30/11    Dr. Gerrit Friends (Grave's disease)    History   Social History  . Marital Status: Married    Spouse Name: N/A    Number of Children: 1  . Years of Education: N/A   Occupational History  . teaches at detention [employer]    Social History Main Topics  . Smoking status: Never Smoker   . Smokeless tobacco: Never Used  . Alcohol Use: Yes     once per week  . Drug Use: No  . Sexually Active: Not on file   Other Topics Concern  . Not on file   Social History Narrative   Divorced and re-married.  Lives with her husband.  Child is grown and lives in own apartment    Family History  Problem Relation Age of Onset  . Cancer Maternal Grandmother     pancreatic  . Heart disease Maternal Grandfather     CABG  in 60's  . Diabetes Paternal Grandmother   . Diabetes Paternal Grandfather   . Heart disease Paternal Grandfather    Current Outpatient Prescriptions on File Prior to Visit  Medication Sig Dispense Refill  . Calcium Carbonate-Vit D-Min (CALCIUM 1200 PO) Take 1 tablet by mouth daily.       . fexofenadine (ALLEGRA) 180 MG tablet Take 180 mg by mouth daily.        . IBUPROFEN PO Take by mouth as needed.        . thyroid (ARMOUR) 65 MG tablet Take 65 mg by mouth 2 (two) times daily.         No Known Allergies  ROS:  Denies fevers, chest pain, palpitations, thyroid symptoms (weight gain, bowel changes, palpitations, etc.).  Denies skin rash, GI complaints or other concerns  PHYSICAL EXAM: BP 140/86  Pulse 72  Temp(Src) 98.6 F (37 C) (Oral)  Ht 5' 1.5" (1.562 m)  Wt 168 lb (76.204 kg)  BMI 31.23 kg/m2  LMP 08/20/2011 Well developed, pleasant female in no distress HEENT: Nose--mild edema, some yellow crusting. Sinuses nontender OP clear without erythema Neck: no  lymphadenopathy, WHSS Heart; regular rate and rhythm without murmur Lungs: clear bilaterally Skin: no rash  ASSESSMENT/PLAN: 1. Sinusitis  amoxicillin-clavulanate (AUGMENTIN) 875-125 MG per tablet  2. Unspecified hypothyroidism    3. Allergic rhinitis, cause unspecified     Sinusitis--treat with Augmentin Allergies--continue Allegra; samples of Veramyst and Omnaris were given. She can try both, and call for Rx for whichever she seems to tolerate the best

## 2011-09-10 NOTE — Patient Instructions (Signed)
Complete the antibiotics. Given two different types of nasal steroid sprays.  If you tolerate one better than the other, call us to send a prescription to your pharmacy

## 2011-09-28 ENCOUNTER — Encounter (INDEPENDENT_AMBULATORY_CARE_PROVIDER_SITE_OTHER): Payer: Self-pay | Admitting: Surgery

## 2011-09-28 ENCOUNTER — Ambulatory Visit (INDEPENDENT_AMBULATORY_CARE_PROVIDER_SITE_OTHER): Payer: Self-pay | Admitting: Surgery

## 2011-09-28 VITALS — BP 164/108 | HR 92 | Temp 97.6°F | Resp 16 | Ht 61.5 in | Wt 169.2 lb

## 2011-09-28 DIAGNOSIS — E041 Nontoxic single thyroid nodule: Secondary | ICD-10-CM | POA: Insufficient documentation

## 2011-09-28 NOTE — Progress Notes (Signed)
Visit Diagnoses: 1. Thyroid nodule, uninodular     HISTORY: Patient returns for followup having undergone total thyroidectomy. Calcium levels have returned to the normal range. She is seeing an endocrinologist in [place] and is taking Armour thyroid. She is due to have laboratory studies drawn next week.   EXAM: Surgical wound is well-healed with no sign of infection or seroma. Cosmetic result is good. Voice quality is normal.   IMPRESSION: Status post total thyroidectomy for thyroid nodules without complication   PLAN: Patient will continue to apply topical creams to her incision. She will followup with her endocrinologist for management of her thyroid hormone replacement. She will return to see me as needed.   Velora Heckler, MD, FACS General & Endocrine Surgery [facility]

## 2011-09-28 NOTE — Patient Instructions (Signed)
  COCOA BUTTER & VITAMIN E CREAM  (Palmer's or other brand)  Apply cocoa butter/vitamin E cream to your incision 2 - 3 times daily.  Massage cream into incision for one minute with each application.  Use sunscreen (50 SPF or higher) for first 6 months after surgery.  You may substitute Mederma or other scar reducing creams as desired.   

## 2012-03-21 ENCOUNTER — Ambulatory Visit (INDEPENDENT_AMBULATORY_CARE_PROVIDER_SITE_OTHER): Payer: BC Managed Care – PPO | Admitting: Family Medicine

## 2012-03-21 ENCOUNTER — Encounter: Payer: Self-pay | Admitting: Family Medicine

## 2012-03-21 VITALS — BP 112/80 | HR 92 | Temp 97.8°F | Resp 16 | Ht 61.5 in | Wt 163.0 lb

## 2012-03-21 DIAGNOSIS — J45909 Unspecified asthma, uncomplicated: Secondary | ICD-10-CM

## 2012-03-21 DIAGNOSIS — J309 Allergic rhinitis, unspecified: Secondary | ICD-10-CM

## 2012-03-21 MED ORDER — MONTELUKAST SODIUM 10 MG PO TABS: 10.0000 mg | ORAL_TABLET | Freq: Every day | ORAL | Status: DC

## 2012-03-21 MED ORDER — OLOPATADINE HCL 0.1 % OP SOLN: 1.0000 [drp] | Freq: Two times a day (BID) | OPHTHALMIC | Status: DC

## 2012-03-21 MED ORDER — ALBUTEROL SULFATE HFA 108 (90 BASE) MCG/ACT IN AERS: 2.0000 | INHALATION_SPRAY | Freq: Four times a day (QID) | RESPIRATORY_TRACT | Status: DC | PRN

## 2012-03-21 NOTE — Progress Notes (Signed)
Patient presents with complaint of allergies.  Started flaring about 10 days ago--sneezing, itchy/watery eyes, slight tightness in chest, possibly flaring her asthma.  Needs refill on albuterol--rx is old.   Taking Allegra daily, which usually keeps things in check, but hasn't been controlling it recently.  Has needed to take Benadryl every 4 hours on top of her Allegra daily in order to get some relief.  She has tried nasal steroids in the past, and they bother her nose, and caused more sinus problems for her.  Has been doing sinus rinses.  Hasn't tried decongestant.  Constant sneezing.   Had allergy shots in the past--was supposed to do for 5 years, but only did for 2.  Past Medical History  Diagnosis Date  . Diabetes mellitus arising in pregnancy   . Migraine   . IBS (irritable bowel syndrome)   . Graves disease   . Fatigue   . Night sweats   . Hyperlipidemia   . Asthma   . SOB (shortness of breath)   . Cough   . Generalized headaches     migraines on occasion.  . Incontinence   . Sinus problem     runny nose  . Family history of breast cancer     MGM   Past Surgical History  Procedure Date  . Cesarean section   . Lasik   . Total thyroidectomy 07/30/11    Dr. Gerrit Friends (Grave's disease)   Family History  Problem Relation Age of Onset  . Cancer Maternal Grandmother     pancreatic  . Heart disease Maternal Grandfather     CABG in 60's  . Diabetes Paternal Grandmother   . Diabetes Paternal Grandfather   . Heart disease Paternal Grandfather   . Cancer Father     prostate   Current Outpatient Prescriptions on File Prior to Visit  Medication Sig Dispense Refill  . fexofenadine (ALLEGRA) 180 MG tablet Take 180 mg by mouth daily.        . IBUPROFEN PO Take by mouth as needed.        . thyroid (ARMOUR) 65 MG tablet Take 65 mg by mouth 2 (two) times daily.        Marland Kitchen albuterol (PROVENTIL HFA;VENTOLIN HFA) 108 (90 BASE) MCG/ACT inhaler Inhale 2 puffs into the lungs every 6 (six)  hours as needed for wheezing.  18 g  1  . montelukast (SINGULAIR) 10 MG tablet Take 1 tablet (10 mg total) by mouth at bedtime.  30 tablet  5   No Known Allergies  ROS:  Denies fevers, nausea, vomiting, diarrhea, chest pain, rash, or other concerns.  See HPI.  PHYSICAL EXAM: BP 112/80  Pulse 92  Temp(Src) 97.8 F (36.6 C) (Oral)  Resp 16  Ht 5' 1.5" (1.562 m)  Wt 163 lb (73.936 kg)  BMI 30.30 kg/m2 Well developed, pleasant female, in no distress.  No sneezing, runny nose or rubbing eyes HEENT:  PERRL, EOMI, conjunctiva clear. Nasal mucosa mildly edematous with clear mucus.  OP clear.  Sinuses nontender Neck: no lymphadenopathy Heart: regular rate and rhythm without  Murmur Lungs: good air movement, trace wheeze Skin: no rash  ASSESSMENT/PLAN: 1. Allergic rhinitis, cause unspecified  olopatadine (PATANOL) 0.1 % ophthalmic solution, montelukast (SINGULAIR) 10 MG tablet  2. Asthma with allergic rhinitis  albuterol (PROVENTIL HFA;VENTOLIN HFA) 108 (90 BASE) MCG/ACT inhaler, montelukast (SINGULAIR) 10 MG tablet   Continue Allegra.  Add Singulair.  Declines nasal steroids. If allergy symptoms persist/worsen, re-consider nasal steroids, vs  allergy referral.  She would be interested in restarting immunotherapy if needed Patanol for eye symptoms Albuterol for asthma/wheezing

## 2012-03-21 NOTE — Patient Instructions (Signed)
Start Singulair daily--might take a week or so for it to have full effect. Continue Allegra.  You can add sudafed to this (vs Allegra D) Continue sinus rinses. Start Patanol eye drops  Consider re-trying a nasal steroid spray if not improving, vs following up with the allergist.   Call/return if you develop sinus pain and discolored mucus

## 2012-03-22 ENCOUNTER — Telehealth: Payer: Self-pay | Admitting: Family Medicine

## 2012-03-24 ENCOUNTER — Telehealth: Payer: Self-pay | Admitting: Family Medicine

## 2012-03-24 NOTE — Telephone Encounter (Signed)
LM

## 2012-06-15 ENCOUNTER — Ambulatory Visit (INDEPENDENT_AMBULATORY_CARE_PROVIDER_SITE_OTHER): Payer: BC Managed Care – PPO | Admitting: Physician Assistant

## 2012-06-15 VITALS — BP 136/92 | HR 96 | Temp 99.3°F | Resp 16 | Ht 60.0 in | Wt 163.6 lb

## 2012-06-15 DIAGNOSIS — R05 Cough: Secondary | ICD-10-CM

## 2012-06-15 DIAGNOSIS — J069 Acute upper respiratory infection, unspecified: Secondary | ICD-10-CM

## 2012-06-15 DIAGNOSIS — J45909 Unspecified asthma, uncomplicated: Secondary | ICD-10-CM

## 2012-06-15 MED ORDER — HYDROCOD POLST-CHLORPHEN POLST 10-8 MG/5ML PO LQCR: 5.0000 mL | Freq: Two times a day (BID) | ORAL | Status: DC

## 2012-06-15 MED ORDER — GUAIFENESIN ER 1200 MG PO TB12: 1.0000 | ORAL_TABLET | Freq: Two times a day (BID) | ORAL | Status: DC

## 2012-06-15 NOTE — Progress Notes (Signed)
  Subjective:    Patient ID: Jacqueline Holmes, female    DOB: 1971/03/01, 41 y.o.   MRN: [id number]  HPI Pt presents to clinic for URI.  Started on Friday with a sore throat which has improved.  She has worsening congestion and cough with mostly white mucus and slight yellow in the am.  She has h/o asthma but not currently needing to use her Albuterol - she has had some chest tightness today but no wheezing.  She states she gets this a lot and always needs abx.   Review of Systems  Constitutional: Negative for fever and chills.  HENT: Positive for congestion, rhinorrhea (white with slight yellow in the am), sneezing and postnasal drip. Negative for sinus pressure.   Respiratory: Positive for cough (not bothering her at night so far but it got worse woday - mostly white sputum except in am slight yello). Negative for shortness of breath and wheezing.   Gastrointestinal: Negative for nausea and vomiting.       Objective:   Physical Exam  Nursing note and vitals reviewed. Constitutional: She is oriented to person, place, and time. She appears well-developed and well-nourished.  HENT:  Head: Normocephalic and atraumatic.  Right Ear: Hearing, tympanic membrane, external ear and ear canal normal.  Left Ear: Hearing, tympanic membrane, external ear and ear canal normal.  Nose: Mucosal edema (red and swollen) present.  Eyes: Conjunctivae are normal.  Neck: Neck supple.  Cardiovascular: Normal rate, regular rhythm and normal heart sounds.   No murmur heard. Pulmonary/Chest: Effort normal and breath sounds normal. No respiratory distress. She has no wheezes.  Lymphadenopathy:    She has no cervical adenopathy.  Neurological: She is alert and oriented to person, place, and time.  Skin: Skin is warm and dry.  Psychiatric: She has a normal mood and affect. Her behavior is normal. Judgment and thought content normal.          Assessment & Plan:   1. Cough  chlorpheniramine-HYDROcodone  (TUSSIONEX PENNKINETIC ER) 10-8 MG/5ML LQCR, Guaifenesin (MUCINEX MAXIMUM STRENGTH) 1200 MG TB12   Feel that pt has a virus at this time with minimal asthma problems.  Pt to use her albuterol as needed.  Pt unhappy that I would not give her an abx felt like I was not listening to her.  I reviewed with her her symptoms and my exam findings and d/w her that I felt this was viral.  Offered to do a CBC but pt declined.  Offered to let her see another provider she declined.  I agreed that if pt started to develop increase in asthma symptoms and increase in sputum production from cough I would call in Z pack #1.  Pt reluctantly agreed.

## 2012-06-20 ENCOUNTER — Telehealth: Payer: Self-pay

## 2012-06-20 MED ORDER — AZITHROMYCIN 250 MG PO TABS: ORAL_TABLET | ORAL | Status: AC

## 2012-06-20 NOTE — Telephone Encounter (Signed)
The patient called to request additional Rx that she and Benny Lennert, [facility] discussed at her 06/15/12 appointment.  Please call patient at [phone].

## 2012-06-20 NOTE — Telephone Encounter (Signed)
Sent to pharmacy 

## 2012-06-21 NOTE — Telephone Encounter (Signed)
[facility] Rx sent to pharmaacy

## 2012-12-05 ENCOUNTER — Encounter (HOSPITAL_COMMUNITY): Payer: Self-pay | Admitting: *Deleted

## 2012-12-05 ENCOUNTER — Emergency Department (HOSPITAL_COMMUNITY): Payer: BC Managed Care – PPO

## 2012-12-05 ENCOUNTER — Emergency Department (HOSPITAL_COMMUNITY)
Admission: EM | Admit: 2012-12-05 | Discharge: 2012-12-06 | Disposition: A | Discharge status: Home / Self Care | Payer: BC Managed Care – PPO | Attending: Emergency Medicine | Admitting: Emergency Medicine

## 2012-12-05 DIAGNOSIS — E119 Type 2 diabetes mellitus without complications: Secondary | ICD-10-CM | POA: Insufficient documentation

## 2012-12-05 DIAGNOSIS — J069 Acute upper respiratory infection, unspecified: Secondary | ICD-10-CM | POA: Insufficient documentation

## 2012-12-05 DIAGNOSIS — J029 Acute pharyngitis, unspecified: Secondary | ICD-10-CM | POA: Insufficient documentation

## 2012-12-05 DIAGNOSIS — E785 Hyperlipidemia, unspecified: Secondary | ICD-10-CM | POA: Insufficient documentation

## 2012-12-05 DIAGNOSIS — J45909 Unspecified asthma, uncomplicated: Secondary | ICD-10-CM | POA: Insufficient documentation

## 2012-12-05 DIAGNOSIS — Z8709 Personal history of other diseases of the respiratory system: Secondary | ICD-10-CM | POA: Insufficient documentation

## 2012-12-05 DIAGNOSIS — R059 Cough, unspecified: Secondary | ICD-10-CM | POA: Insufficient documentation

## 2012-12-05 DIAGNOSIS — Z79899 Other long term (current) drug therapy: Secondary | ICD-10-CM | POA: Insufficient documentation

## 2012-12-05 DIAGNOSIS — Z8639 Personal history of other endocrine, nutritional and metabolic disease: Secondary | ICD-10-CM | POA: Insufficient documentation

## 2012-12-05 DIAGNOSIS — R51 Headache: Secondary | ICD-10-CM | POA: Insufficient documentation

## 2012-12-05 DIAGNOSIS — R509 Fever, unspecified: Secondary | ICD-10-CM | POA: Insufficient documentation

## 2012-12-05 DIAGNOSIS — R05 Cough: Secondary | ICD-10-CM | POA: Insufficient documentation

## 2012-12-05 DIAGNOSIS — R5381 Other malaise: Secondary | ICD-10-CM | POA: Insufficient documentation

## 2012-12-05 DIAGNOSIS — R0602 Shortness of breath: Secondary | ICD-10-CM | POA: Insufficient documentation

## 2012-12-05 DIAGNOSIS — Z8719 Personal history of other diseases of the digestive system: Secondary | ICD-10-CM | POA: Insufficient documentation

## 2012-12-05 DIAGNOSIS — Z862 Personal history of diseases of the blood and blood-forming organs and certain disorders involving the immune mechanism: Secondary | ICD-10-CM | POA: Insufficient documentation

## 2012-12-05 DIAGNOSIS — Z8679 Personal history of other diseases of the circulatory system: Secondary | ICD-10-CM | POA: Insufficient documentation

## 2012-12-05 IMAGING — CR DG CHEST 2V
2 series · 2 of 2 positions shown · non-contrast
Comparison: PA and lateral chest [DATE].

CLINICAL DATA: Shortness of breath, cough and chest pain.

CHEST - 2 VIEW

[w chest pa]
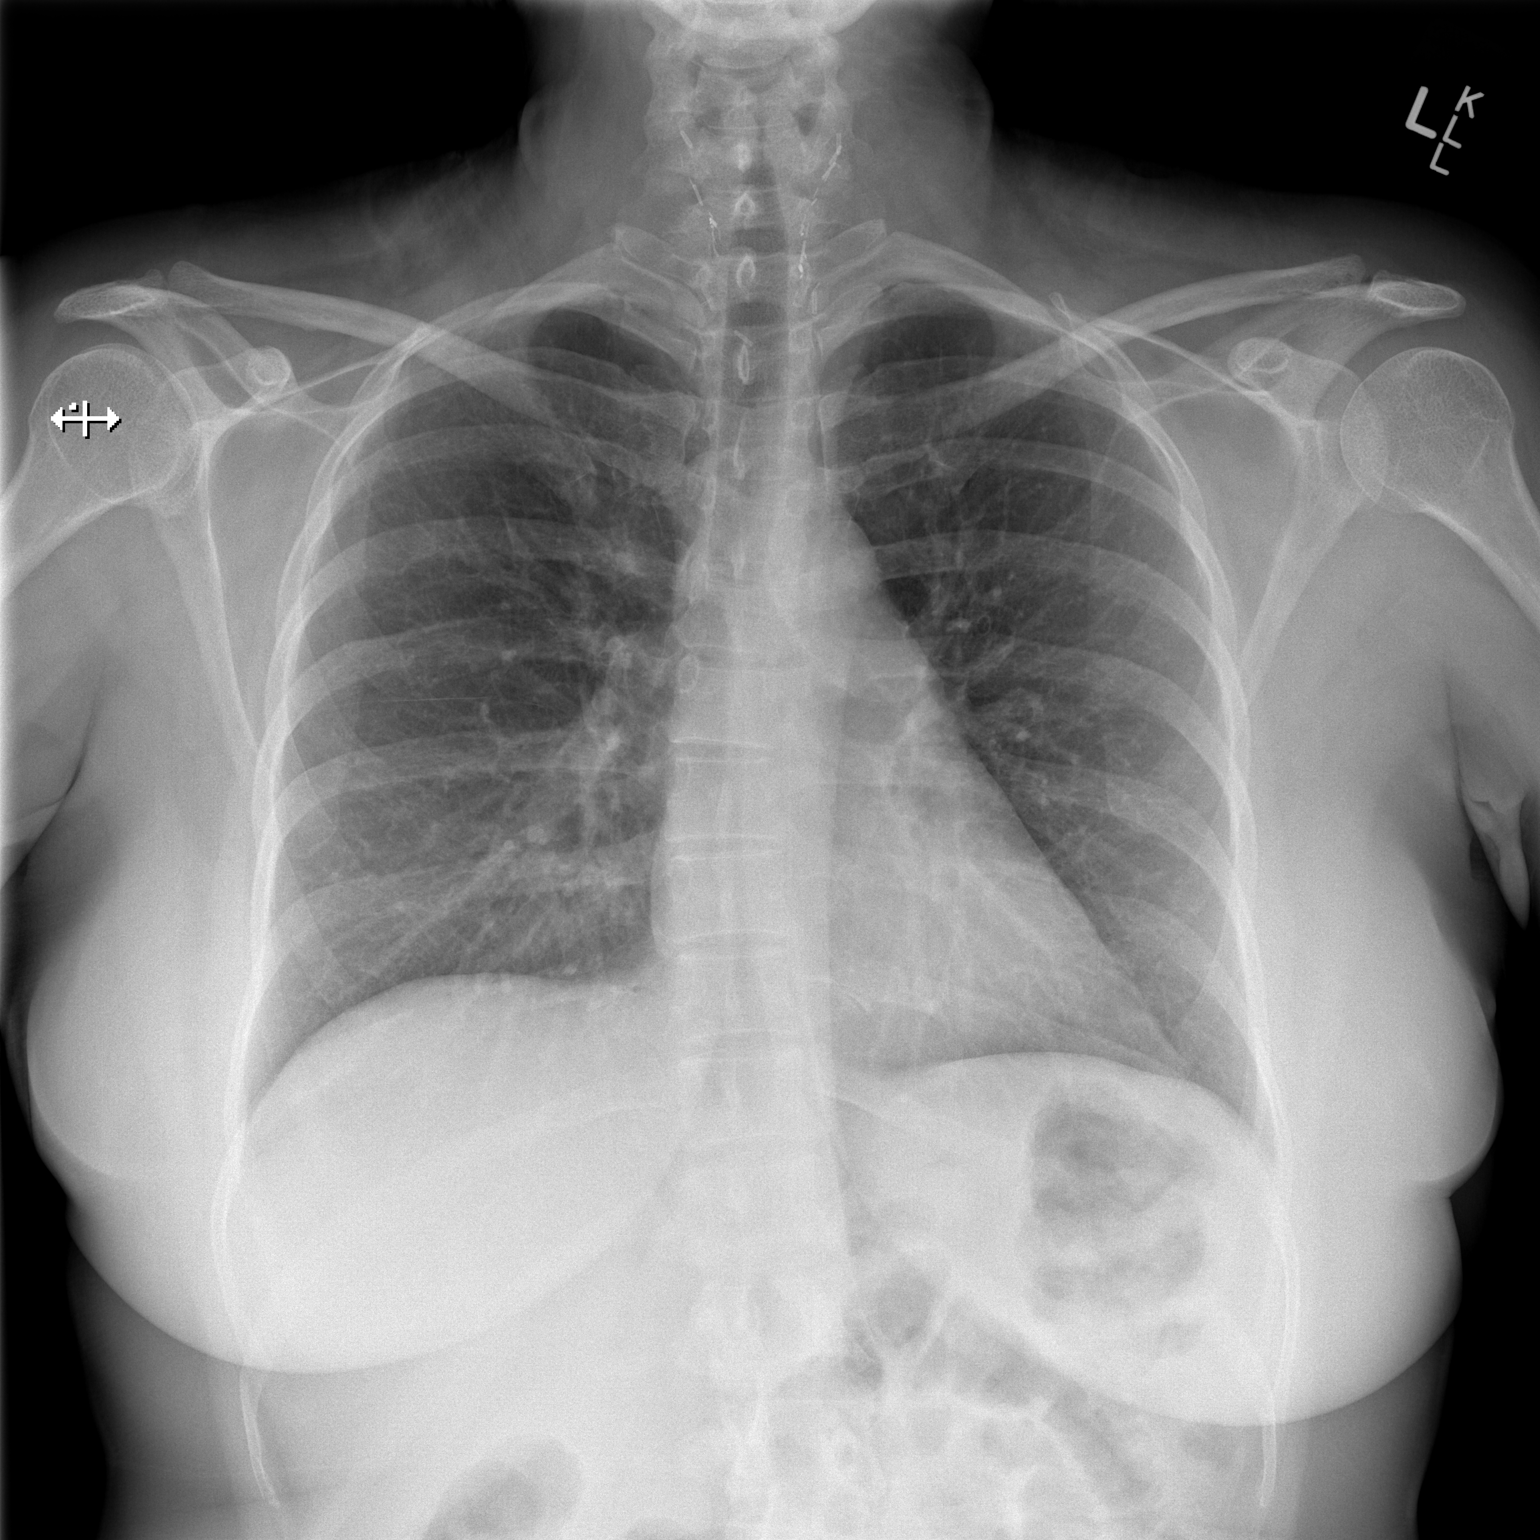

[w chest lat]
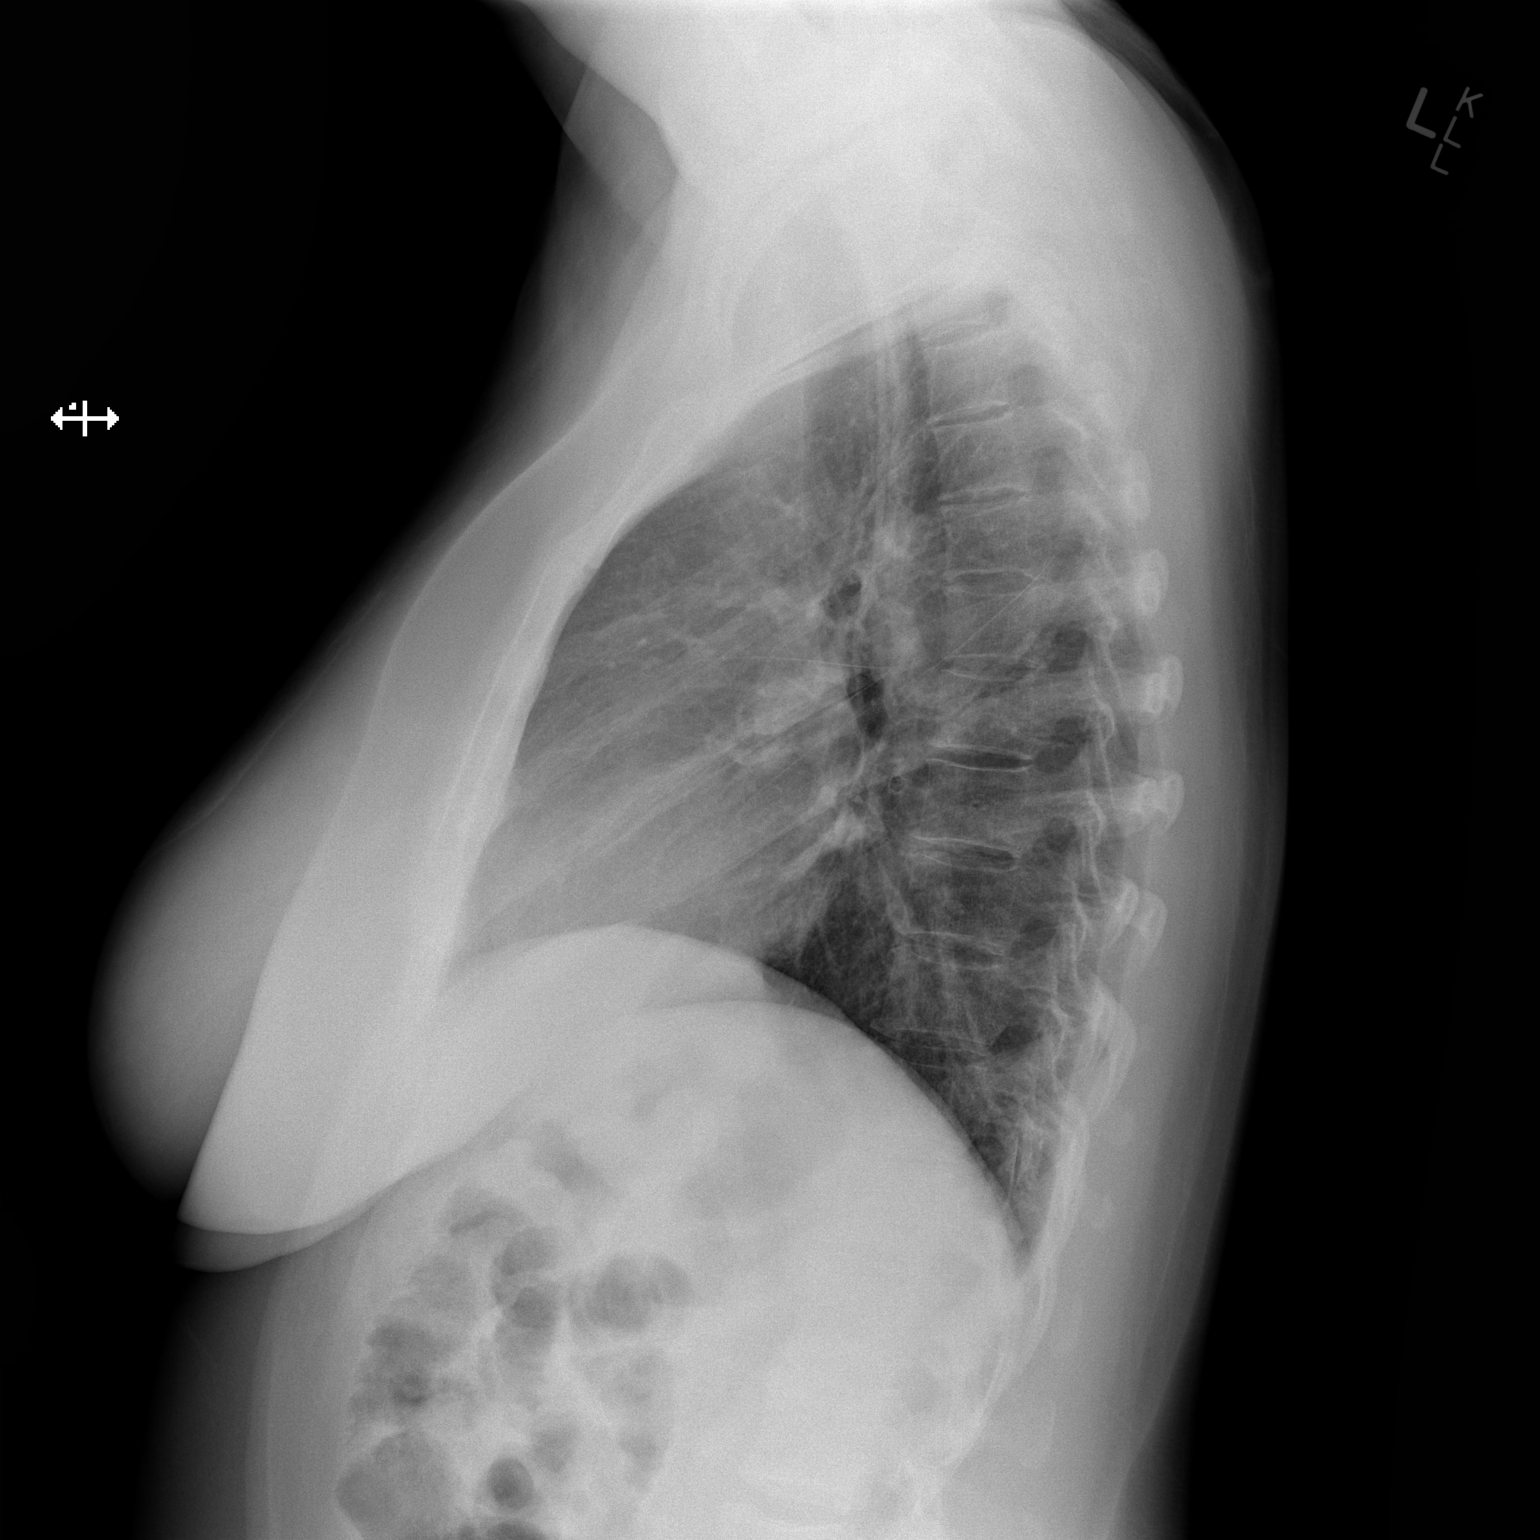

[2 of 2 positions shown; findings below may reference images not displayed]

FINDINGS: Lungs are clear.  Heart size is normal.  No pneumothorax
or pleural fluid.
IMPRESSION: Negative chest.

## 2012-12-05 NOTE — ED Notes (Signed)
Pt c/o upper respiratory infection since Thursday; nonproductive cough; feels like choking at times; fevers off and on; headache; weakness; sore throat from coughing

## 2012-12-06 MED ORDER — IPRATROPIUM BROMIDE 0.02 % IN SOLN
0.5000 mg | Freq: Once | RESPIRATORY_TRACT | Status: AC
  Administered 2012-12-06: 0.5 mg via RESPIRATORY_TRACT
  Filled 2012-12-06: qty 2.5

## 2012-12-06 MED ORDER — ALBUTEROL SULFATE (5 MG/ML) 0.5% IN NEBU
5.0000 mg | INHALATION_SOLUTION | Freq: Once | RESPIRATORY_TRACT | Status: AC
  Administered 2012-12-06: 5 mg via RESPIRATORY_TRACT
  Filled 2012-12-06: qty 1

## 2012-12-06 MED ORDER — PREDNISONE 20 MG PO TABS: 20.0000 mg | ORAL_TABLET | Freq: Two times a day (BID) | ORAL | Status: DC

## 2012-12-06 NOTE — ED Notes (Signed)
Schinlever, PA at bedside.  

## 2012-12-06 NOTE — ED Provider Notes (Signed)
History     CSN: [id number]  Arrival date & time 12/05/12  2225   First MD Initiated Contact with Patient 12/06/12 0235      Chief Complaint  Patient presents with  . Flu-Like Symptoms     (Consider location/radiation/quality/duration/timing/severity/associated sxs/prior treatment) HPI History provided by pt.   42yo F presents w/ SOB since yesterday.  Developed frontal headache, nasal congestion, sore throat, cough, fever, body aches 3 days ago.  Has mid-line CP, only when she coughs.  Has not taken anything for sx.  H/o asthma w/ exacerbation approx 2x/yr.   Past Medical History  Diagnosis Date  . Diabetes mellitus arising in pregnancy   . Migraine   . IBS (irritable bowel syndrome)   . Graves disease   . Fatigue   . Night sweats   . Hyperlipidemia   . Asthma   . SOB (shortness of breath)   . Cough   . Generalized headaches     migraines on occasion.  . Incontinence   . Sinus problem     runny nose  . Family history of breast cancer     MGM    Past Surgical History  Procedure Date  . Cesarean section   . Lasik   . Total thyroidectomy 07/30/11    Dr. Gerrit Friends (Grave's disease)    Family History  Problem Relation Age of Onset  . Cancer Maternal Grandmother     pancreatic  . Heart disease Maternal Grandfather     CABG in 60's  . Diabetes Paternal Grandmother   . Diabetes Paternal Grandfather   . Heart disease Paternal Grandfather   . Cancer Father     prostate    History  Substance Use Topics  . Smoking status: Never Smoker   . Smokeless tobacco: Never Used  . Alcohol Use: Yes     Comment: once per week    OB History    Grav Para Term Preterm Abortions TAB SAB Ect Mult Living                  Review of Systems  All other systems reviewed and are negative.    Allergies  Shellfish allergy  Home Medications   Current Outpatient Rx  Name  Route  Sig  Dispense  Refill  . ALBUTEROL SULFATE HFA 108 (90 BASE) MCG/ACT IN AERS   Inhalation  Inhale 2 puffs into the lungs every 6 (six) hours as needed for wheezing.   18 g   1   . AMOXICILLIN 875 MG PO TABS   Oral   Take 875 mg by mouth 2 (two) times daily. Pt stopped taking when prescribed Biaxin.         Marland Kitchen CLARITHROMYCIN 500 MG PO TABS   Oral   Take 500 mg by mouth 2 (two) times daily.         Marland Kitchen FEXOFENADINE HCL 180 MG PO TABS   Oral   Take 180 mg by mouth daily.           . GUAIFENESIN ER 1200 MG PO TB12   Oral   Take 1 tablet (1,200 mg total) by mouth 2 (two) times daily.   14 each   0   . IBUPROFEN PO   Oral   Take 800 mg by mouth every 6 (six) hours as needed.          Marland Kitchen METFORMIN HCL 500 MG PO TABS   Oral   Take 250 mg by mouth 2 (  two) times daily with a meal. Pt takes 1/2 tab         . MONTELUKAST SODIUM 10 MG PO TABS   Oral   Take 1 tablet (10 mg total) by mouth at bedtime.   30 tablet   5   . NATURE-THROID PO   Oral   Take by mouth daily. Pt taking 1.25 grains bid         . PREDNISONE 20 MG PO TABS   Oral   Take 1 tablet (20 mg total) by mouth 2 (two) times daily.   10 tablet   0     BP 154/97  Pulse 94  Temp 97.7 F (36.5 C) (Oral)  Resp 18  SpO2 99%  LMP 11/21/2012  Physical Exam  Nursing note and vitals reviewed. Constitutional: She is oriented to person, place, and time. She appears well-developed and well-nourished. No distress.  HENT:  Head: Normocephalic and atraumatic. No trismus in the jaw.  Mouth/Throat: Uvula is midline and mucous membranes are normal. No posterior oropharyngeal edema or posterior oropharyngeal erythema.       Left and right TM appear nml and external canal w/out edema, erythema or drainage.  Oropharynx clear.  No erythema of posterior pharynx. Tonsils symmetric and w/out edema/exudate.  Uvula mid-line.  No trismus.  No nasal discharge.   Eyes:       Normal appearance  Neck: Normal range of motion. Neck supple.  Cardiovascular: Normal rate, regular rhythm and intact distal pulses.     Pulmonary/Chest: Effort normal. No respiratory distress. She has no wheezes. She exhibits no tenderness.       Coughing.  Upper respiratory breath sounds.   Abdominal: Soft. Bowel sounds are normal. She exhibits no distension. There is no tenderness. There is no guarding.  Musculoskeletal: Normal range of motion.       No peripheral edema or calf tenderness  Lymphadenopathy:    She has no cervical adenopathy.  Neurological: She is alert and oriented to person, place, and time.  Skin: Skin is warm and dry. No rash noted.  Psychiatric: She has a normal mood and affect. Her behavior is normal.    ED Course  Procedures (including critical care time)  Labs Reviewed - No data to display Dg Chest 2 View  12/05/2012  *RADIOLOGY REPORT*  Clinical Data: Shortness of breath, cough and chest pain.  CHEST - 2 VIEW  Comparison: PA and lateral chest 07/23/2011.  Findings: Lungs are clear.  Heart size is normal.  No pneumothorax or pleural fluid.  IMPRESSION: Negative chest.   Original Report Authenticated By: Holley Dexter, M.D.      1. Viral URI       MDM  41yo F w/ h/o asthma presents w/ SOB in setting of 3 days of URI sx.  On exam, afebrile, no respiratory distress, nml breath sounds, coughing.  Pt received a breathing treatment and reported improvement in sx.  Decreased coughing on repeat exam.  Nursing staff ambulated and pt maintained her sats and did not become symptomatic.  She has an albuterol inhaler at home.  Prescribed 5 days course of prednisone.  Return precautions discussed.        Otilio Miu, PA-C 12/06/12 [phone]

## 2012-12-06 NOTE — ED Provider Notes (Signed)
Medical screening examination/treatment/procedure(s) were performed by non-physician practitioner and as supervising physician I was immediately available for consultation/collaboration.   Kaylan Yates L Lolita Faulds, MD 12/06/12 0706 

## 2012-12-06 NOTE — ED Notes (Signed)
Respiratory paged for nebulizer treatment

## 2013-01-17 ENCOUNTER — Other Ambulatory Visit: Payer: Self-pay | Admitting: Obstetrics and Gynecology

## 2013-01-17 DIAGNOSIS — R928 Other abnormal and inconclusive findings on diagnostic imaging of breast: Secondary | ICD-10-CM

## 2013-01-25 ENCOUNTER — Ambulatory Visit
Admission: RE | Admit: 2013-01-25 | Discharge: 2013-01-25 | Disposition: A | Discharge status: Home / Self Care | Payer: BC Managed Care – PPO | Source: Ambulatory Visit | Attending: Obstetrics and Gynecology | Admitting: Obstetrics and Gynecology

## 2013-01-25 DIAGNOSIS — R928 Other abnormal and inconclusive findings on diagnostic imaging of breast: Secondary | ICD-10-CM

## 2013-01-25 IMAGING — MG MM DIGITAL DIAGNOSTIC UNILAT*L*
4 series · 4 of 4 positions shown · non-contrast
Comparison: With priors

CLINICAL DATA: Abnormal left screening mammogram

DIGITAL DIAGNOSTIC LEFT MAMMOGRAM WITH CAD AND LEFT BREAST
ULTRASOUND:

[L CC (1 of 2)]
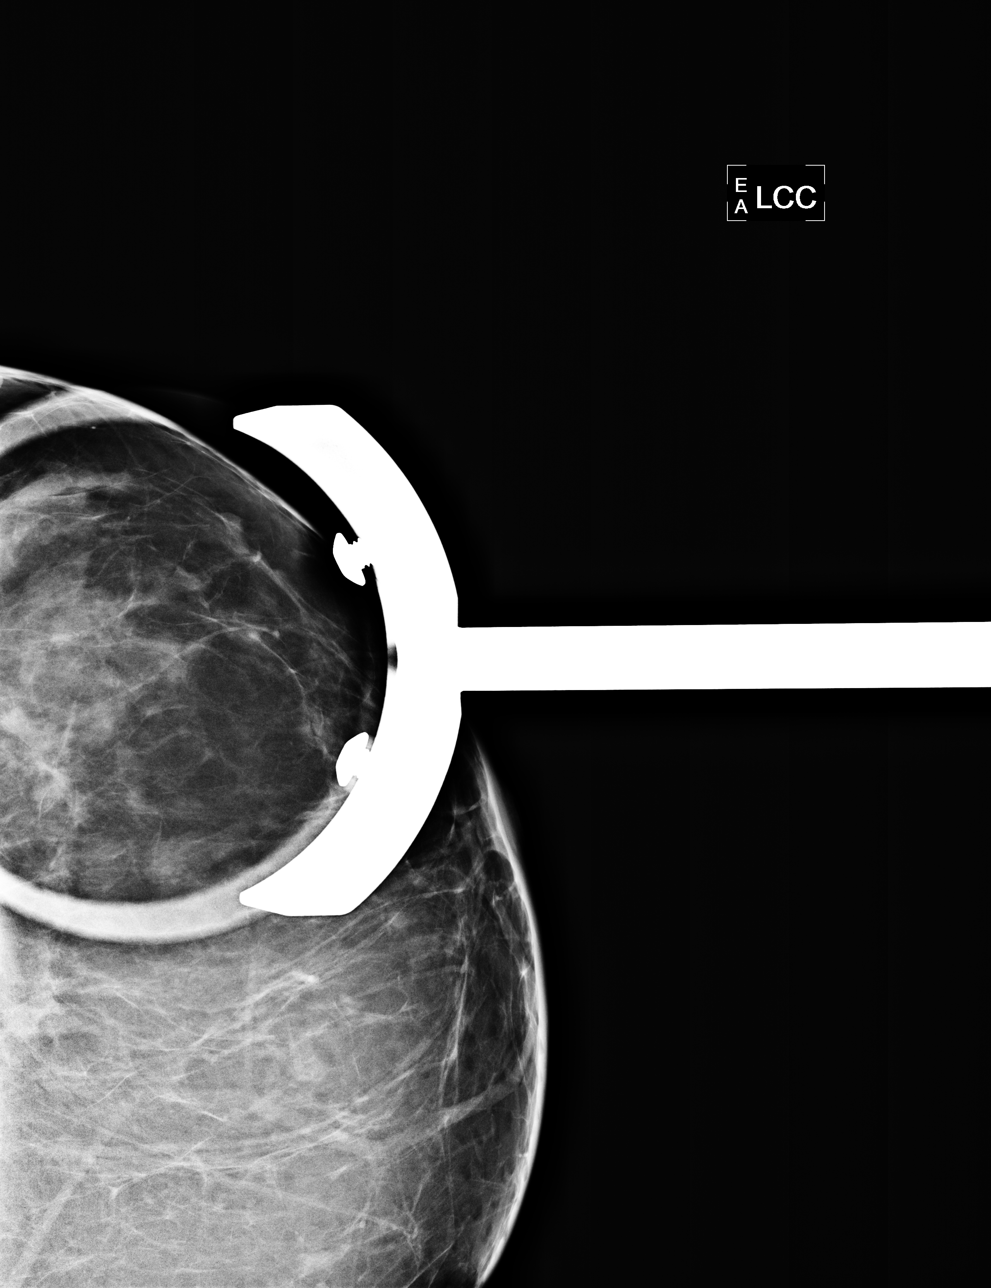

[L MLO]
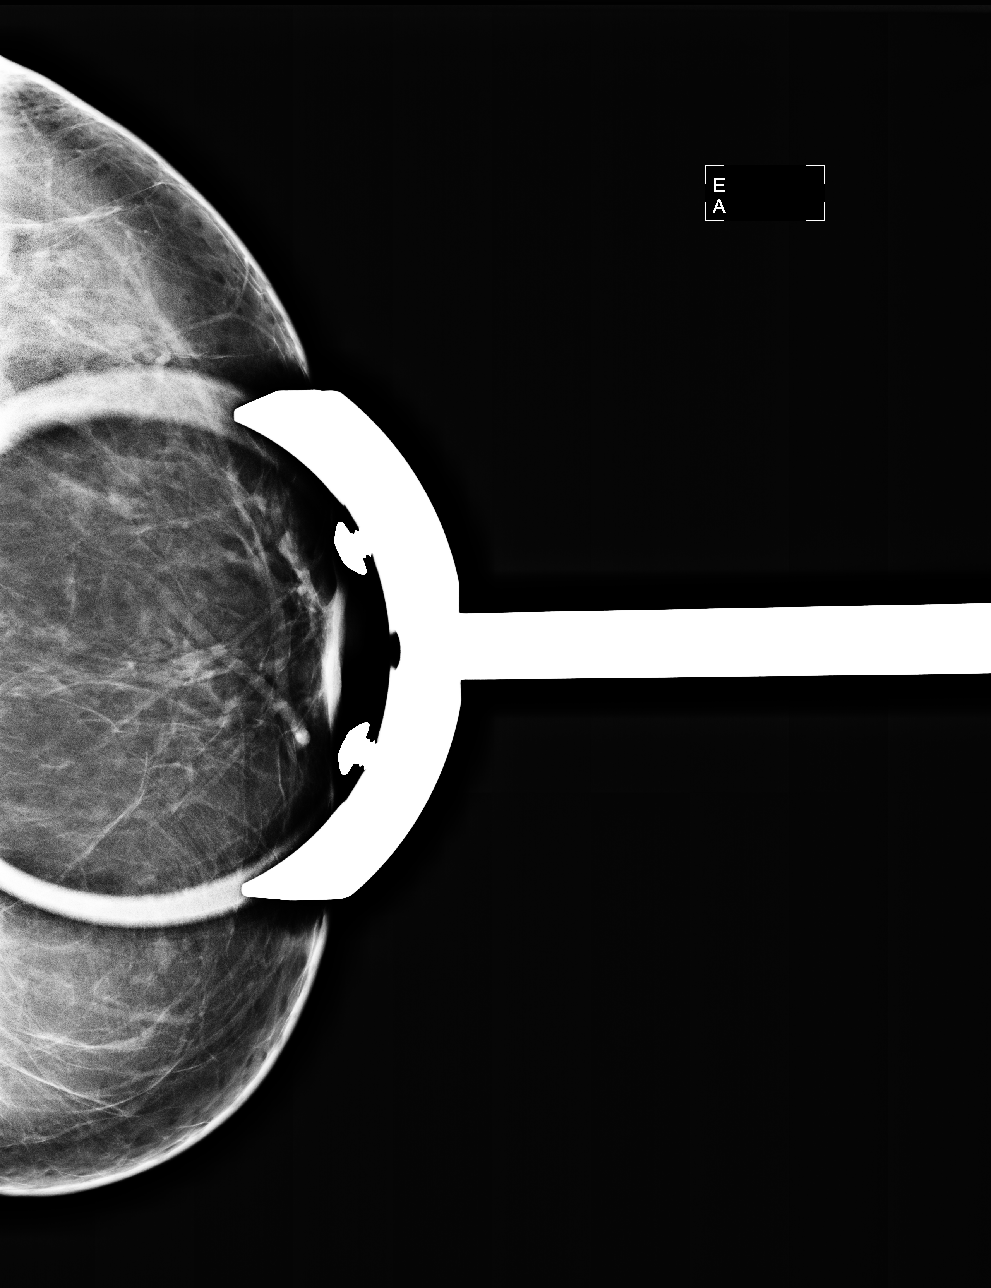

[L CC (2 of 2)]
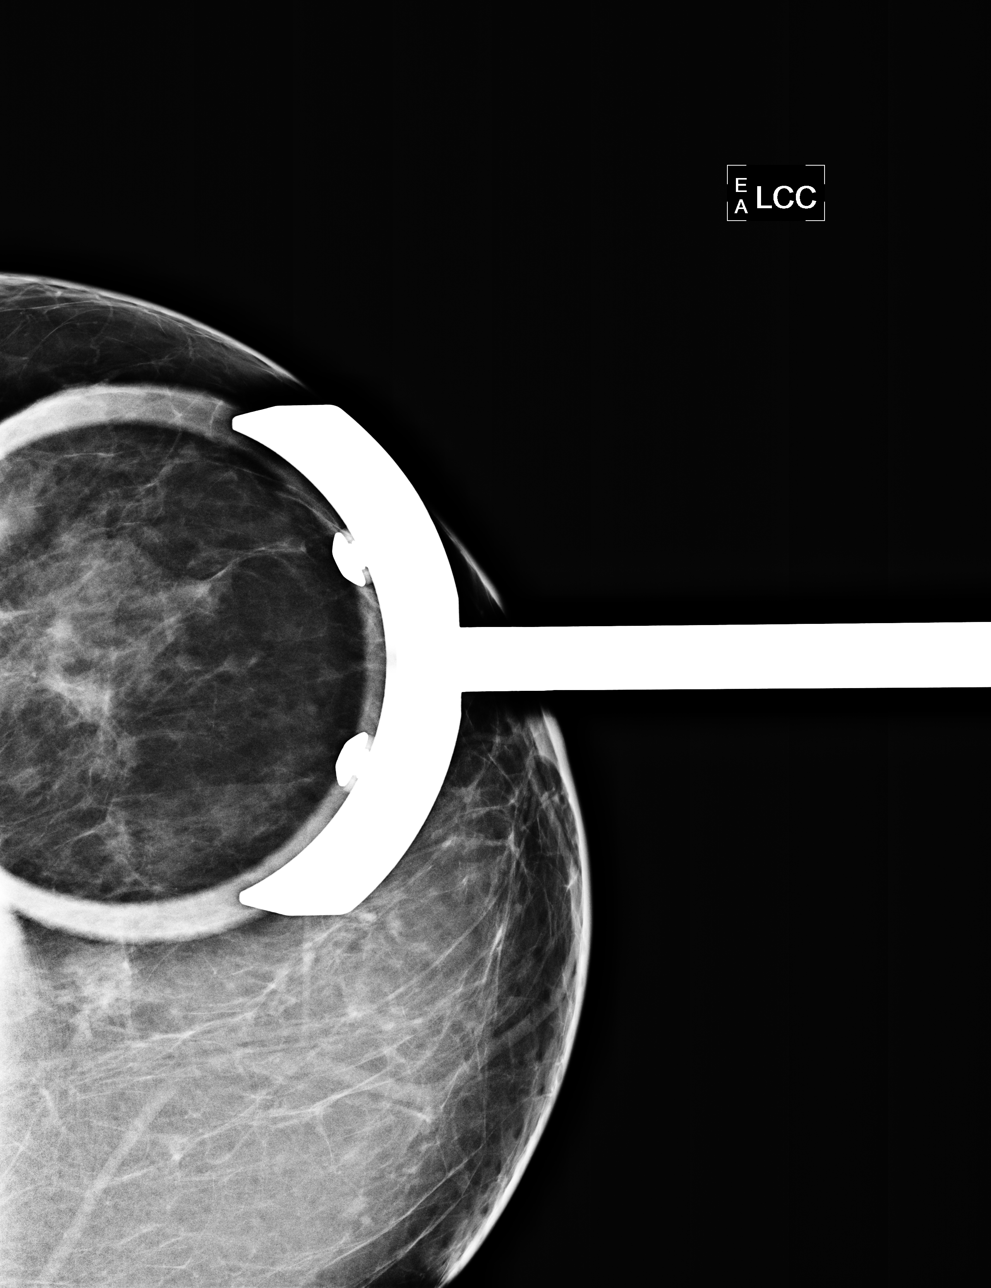

[L ML]
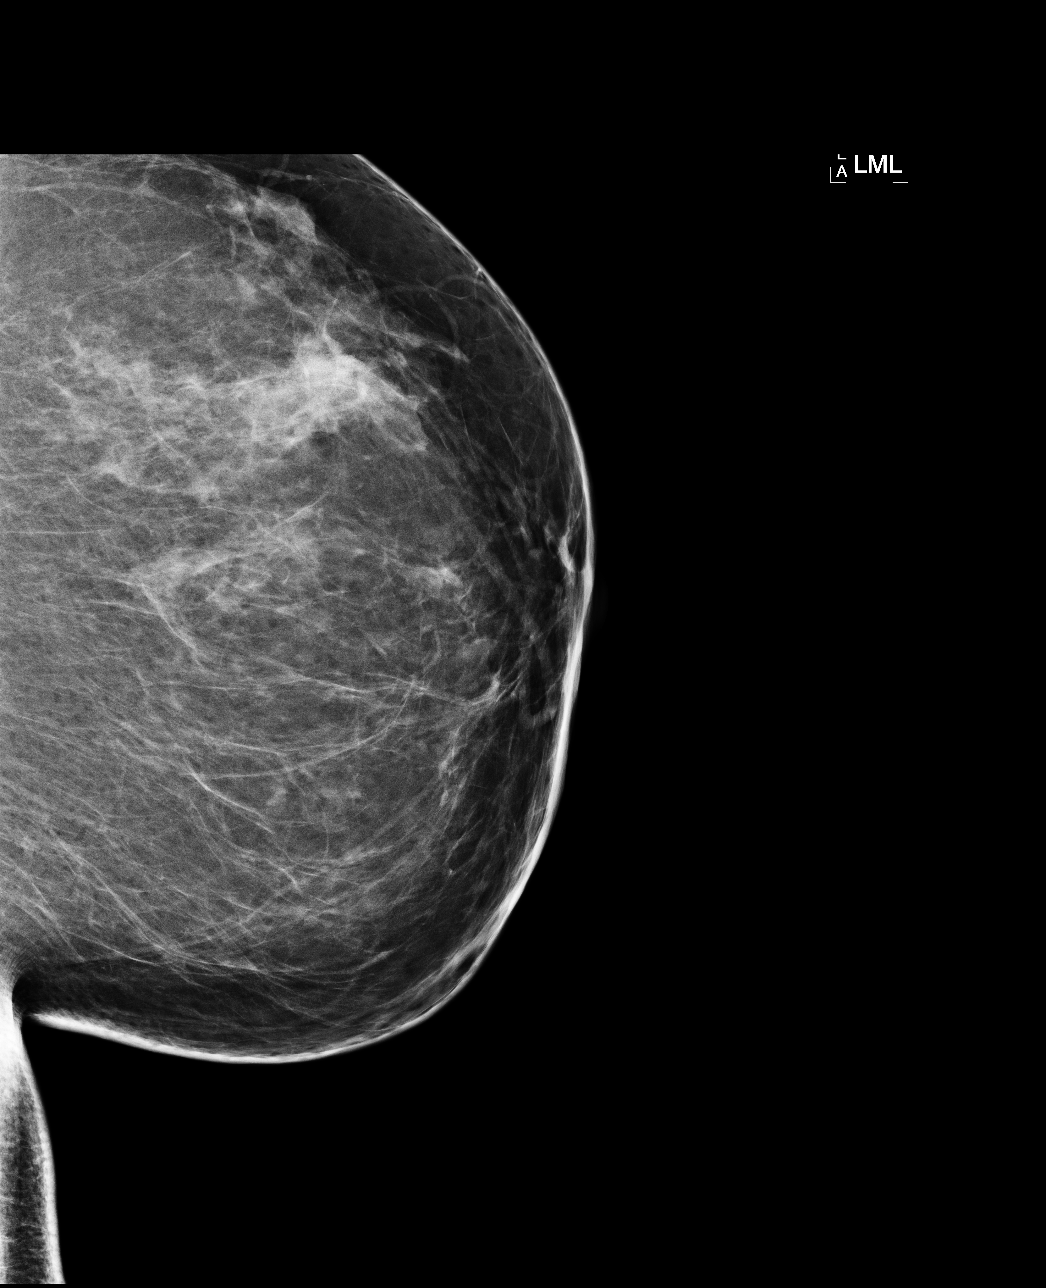

[4 of 4 positions shown; findings below may reference images not displayed]

FINDINGS: ACR Breast Density Category 2: There is a scattered fibroglandular
pattern.

True lateral and spot compression views of the left breast were
obtained.  There is no concerning mass, distortion or malignant-
type microcalcifications.  The parenchymal pattern is unchanged
from the prior exams.

Mammographic images were processed with CAD.

On physical exam, I do not palpate a mass in the left breast.

Ultrasound is performed, showing normal tissue is seen in the left
breast.  No solid or cystic mass, abnormal shadowing distortion
detected.
IMPRESSION: No evidence of malignancy in the left breast.

RECOMMENDATION:
Bilateral screening mammogram in 1 year is recommended.

I have discussed the findings and recommendations with the patient.
Results were also provided in writing at the conclusion of the
visit.

BI-RADS CATEGORY 1:  Negative.

## 2013-02-01 ENCOUNTER — Other Ambulatory Visit: Payer: Self-pay | Admitting: Family Medicine

## 2013-06-15 ENCOUNTER — Ambulatory Visit (INDEPENDENT_AMBULATORY_CARE_PROVIDER_SITE_OTHER): Payer: BC Managed Care – PPO | Admitting: Family Medicine

## 2013-06-15 ENCOUNTER — Encounter: Payer: Self-pay | Admitting: Family Medicine

## 2013-06-15 VITALS — BP 120/80 | HR 72 | Ht 61.0 in | Wt 163.0 lb

## 2013-06-15 DIAGNOSIS — R109 Unspecified abdominal pain: Secondary | ICD-10-CM

## 2013-06-15 DIAGNOSIS — Z3201 Encounter for pregnancy test, result positive: Secondary | ICD-10-CM

## 2013-06-15 DIAGNOSIS — Z3490 Encounter for supervision of normal pregnancy, unspecified, unspecified trimester: Secondary | ICD-10-CM

## 2013-06-15 LAB — POCT URINALYSIS DIPSTICK
Bilirubin, UA: NEGATIVE
Glucose, UA: NEGATIVE
Nitrite, UA: NEGATIVE

## 2013-06-15 LAB — POCT URINE PREGNANCY: Preg Test, Ur: POSITIVE

## 2013-06-15 NOTE — Patient Instructions (Signed)
change from allegra to zyrtec.  Continue singulair Start prenatal vitamin  Call OB-GYN for appt (Dr. Renaldo Fiddler)

## 2013-06-15 NOTE — Progress Notes (Signed)
Chief Complaint  Patient presents with  . Abdominal Pain    last week thought she was constipated, took dulcolax and produced a loose stool with 24 hrs. Doesn't feel better, has a dull pain in her abdomen and her bowel movements are having a pale color to them x the past 3 days. Also has not had much of an appetite and didn't really eat much last week for the first 3-4 days of this.   Started a week ago with some gassiness and bloating, then developed a dull achey pain across her suprapubic region, centrally.  She thought she was constipated.  Took Dulcolax, and had loose stools, but discomfort persisted.  Pain has improved some, but not resolved.  Denies blood in the stools.  Stool consistency is now normal, but appear pale.  Denies any change in diet (other than decreased appetite and food intake x 2 days; improved x 4 days, eating better).   Saw some spotting yesterday, and some today.  Only notices blood when she wipes, feels like it is vaginal.  Denies noticing blood in the urine itself.  Periods are usually regular, sometimes skips.  Skipped a period a few months ago.  LMP 6/2, but has been spotting x 2 days.  She is in a sexual relationship, not using contraception.  Denies nausea.  She has chronic urinary frequency, unchanged.  Denies dysuria, incontinence.  Past Medical History  Diagnosis Date  . Diabetes mellitus arising in pregnancy   . Migraine   . IBS (irritable bowel syndrome)   . Graves disease   . Fatigue   . Night sweats   . Hyperlipidemia   . Asthma   . SOB (shortness of breath)   . Cough   . Generalized headaches     migraines on occasion.  . Incontinence   . Sinus problem     runny nose  . Family history of breast cancer     MGM   Past Surgical History  Procedure Laterality Date  . Cesarean section    . Lasik    . Total thyroidectomy  07/30/11    Dr. Gerrit Friends (Grave's disease)   History   Social History  . Marital Status: Married    Spouse Name: N/A     Number of Children: 1  . Years of Education: N/A   Occupational History  . personalized learning environment facilitator at [employer]    Social History Main Topics  . Smoking status: Never Smoker   . Smokeless tobacco: Never Used  . Alcohol Use: Yes     Comment: once per week  . Drug Use: No  . Sexually Active: Yes -- Female partner(s)    Birth Control/ Protection: None   Other Topics Concern  . Not on file   Social History Narrative   Divorced and re-married.  Lives with her husband.  Child is grown and lives in own apartment    Current Outpatient Prescriptions on File Prior to Visit  Medication Sig Dispense Refill  . fexofenadine (ALLEGRA) 180 MG tablet Take 180 mg by mouth daily.        . metFORMIN (GLUCOPHAGE) 500 MG tablet Take 500 mg by mouth daily with breakfast.       . montelukast (SINGULAIR) 10 MG tablet TAKE ONE TABLET BY MOUTH AT BEDTIME  30 tablet  4  . Thyroid (NATURE-THROID PO) Take by mouth daily. Pt taking 1.25 grains bid      . albuterol (PROVENTIL HFA;VENTOLIN HFA) 108 (90 BASE) MCG/ACT  inhaler Inhale 2 puffs into the lungs every 6 (six) hours as needed for wheezing.  18 g  1  . IBUPROFEN PO Take 800 mg by mouth every 6 (six) hours as needed.        No current facility-administered medications on file prior to visit.    Allergies  Allergen Reactions  . Shellfish Allergy Nausea And Vomiting   ROS:  Denies fevers, flank pain, urinary complaints, URI symptoms, chest pain, shortness of breath.  +GI complaints--see HPI.  Denies vaginal discharge  PHYSICAL EXAM: BP 120/80  Pulse 72  Ht 5\' 1"  (1.549 m)  Wt 163 lb (73.936 kg)  BMI 30.81 kg/m2  LMP 05/01/2013 Pleasant female in no distress Heart: regular rate and rhythm Lungs: clear bilaterally Abdomen: mild diffuse lower abdominal tenderness.  No mass. No rebound or guarding.  No focal tenderness  Pregnancy test + Urine:  Trace leuks, 2+ blood  (likely contaminant from vaginal  spotting.  ASSESSMENT/PLAN:  Abdominal pain - Plan: POCT Urinalysis Dipstick, POCT urine pregnancy  Pregnancy  Reviewed pregnancy categories of meds--change from allegra to zyrtec.  Continue singulair Start prenatal vitamin  Call OB-GYN for appt (Dr. Renaldo Fiddler)  Discussed at length the fact that she is pregnant, with spotting--could be short-lived due to implantation, vs a sign of impending miscarriage. We also discussed the fact that location of pregnancy is not known--that if she develops worsening abdominal pain and/or bleeding, she needs to seek immediate evaluation (ectopic, etc briefly discussed).  She was in shock regarding this unexpected pregnancy.  All questions were answered  25 minute visit, more than 1/2 spent counseling

## 2013-06-16 ENCOUNTER — Encounter: Payer: Self-pay | Admitting: Family Medicine

## 2013-11-27 ENCOUNTER — Other Ambulatory Visit (HOSPITAL_COMMUNITY): Payer: Self-pay | Admitting: Gynecology

## 2013-11-27 DIAGNOSIS — Z3141 Encounter for fertility testing: Secondary | ICD-10-CM

## 2013-12-05 ENCOUNTER — Encounter (INDEPENDENT_AMBULATORY_CARE_PROVIDER_SITE_OTHER): Payer: Self-pay

## 2013-12-05 ENCOUNTER — Ambulatory Visit (HOSPITAL_COMMUNITY)
Admission: RE | Admit: 2013-12-05 | Discharge: 2013-12-05 | Disposition: A | Discharge status: Home / Self Care | Payer: BC Managed Care – PPO | Source: Ambulatory Visit | Attending: Gynecology | Admitting: Gynecology

## 2013-12-05 DIAGNOSIS — N979 Female infertility, unspecified: Secondary | ICD-10-CM | POA: Insufficient documentation

## 2013-12-05 DIAGNOSIS — Z3141 Encounter for fertility testing: Secondary | ICD-10-CM

## 2013-12-05 IMAGING — RF DG HYSTEROGRAM
6 series · 6 of 6 positions shown · non-contrast
Comparison: None.

CLINICAL DATA: Infertility.

EXAM:
HYSTEROSALPINGOGRAM
TECHNIQUE: Hysterosalpingogram was performed by the ordering physician under
fluoroscopy. Fluoroscopic images were submitted for radiologic
interpretation following the procedure. Please see the procedural
report for the amount of contrast and the fluoroscopy time utilized.

[Series 1: run · 1 of 1 slices shown (1 of 6)]
[im 1/1]
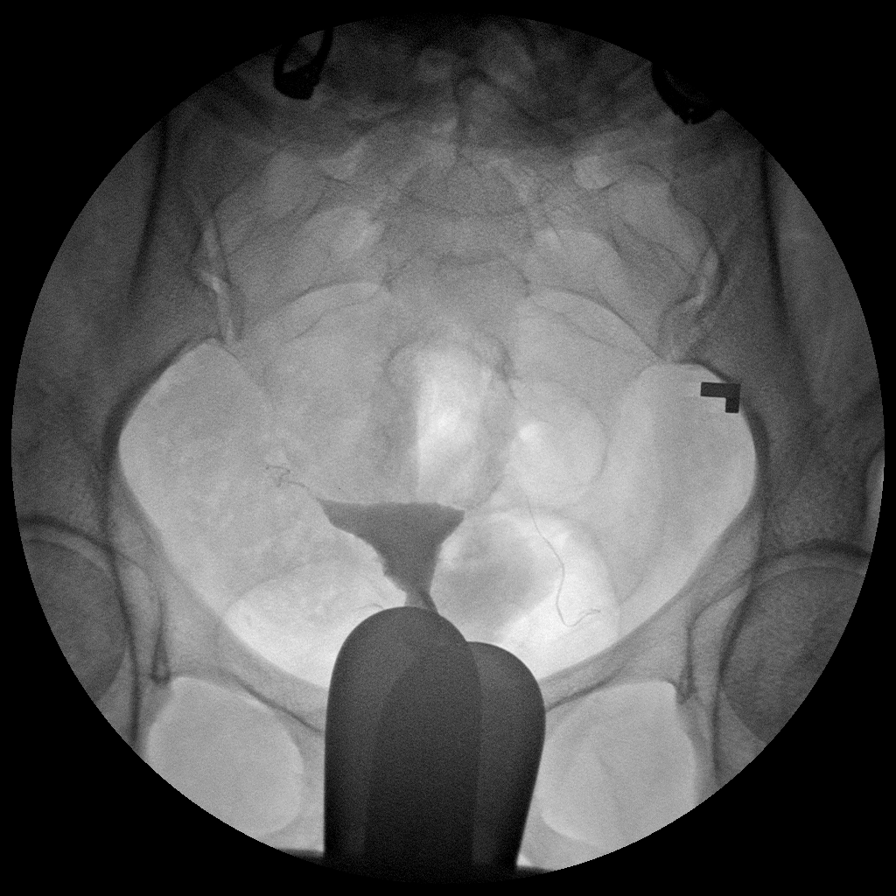

[Series 2: run · 1 of 1 slices shown (2 of 6)]
[im 1/1]
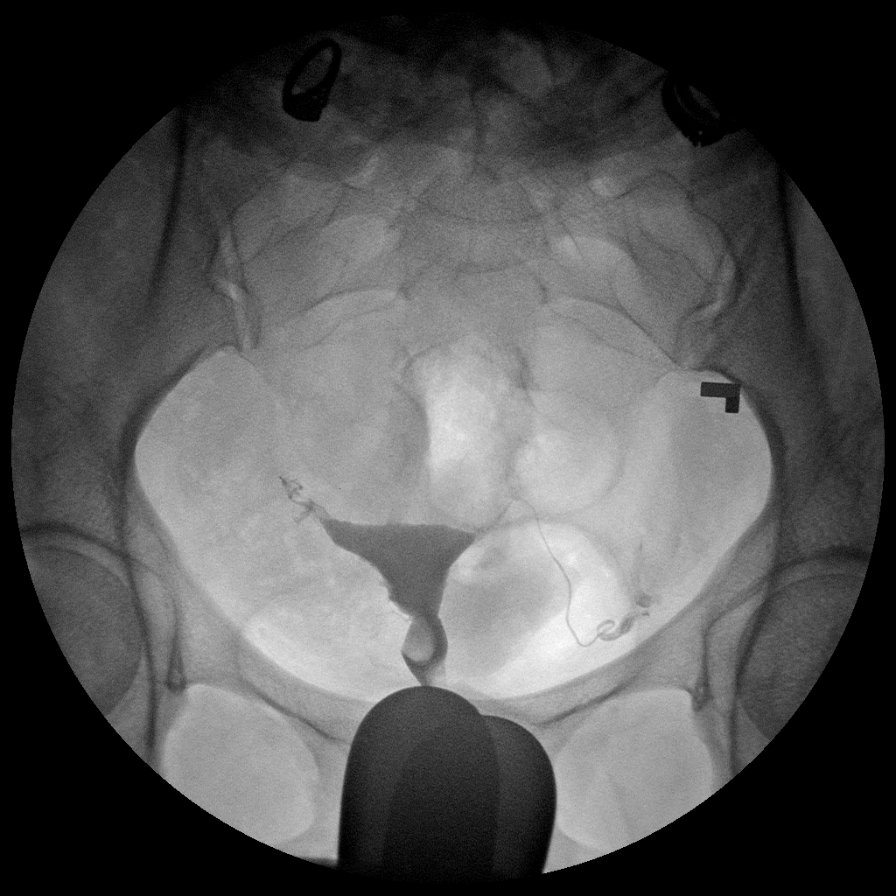

[Series 3: run · 1 of 1 slices shown (3 of 6)]
[im 1/1]
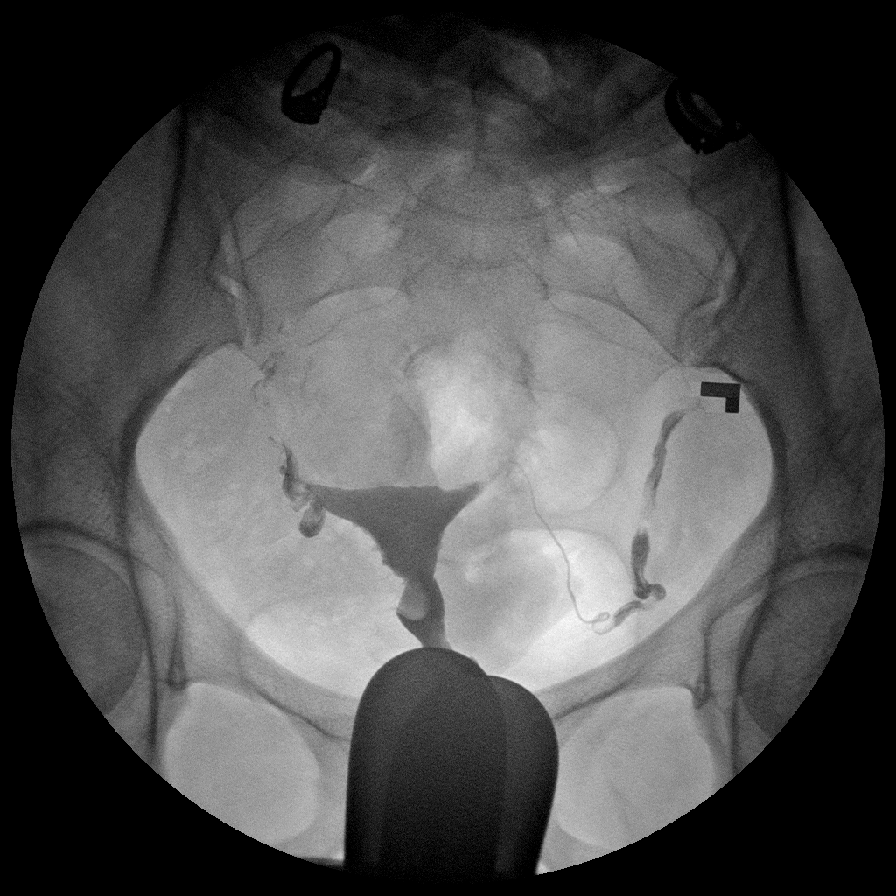

[Series 4: run · 1 of 1 slices shown (4 of 6)]
[im 1/1]
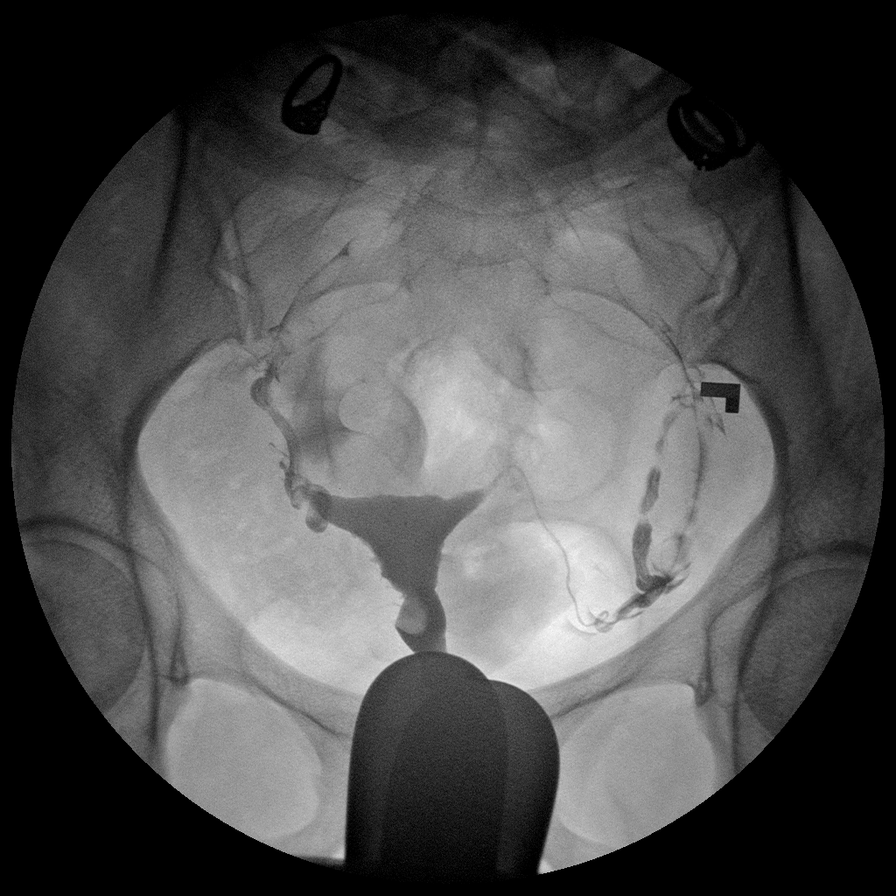

[Series 5: run · 1 of 1 slices shown (5 of 6)]
[im 1/1]
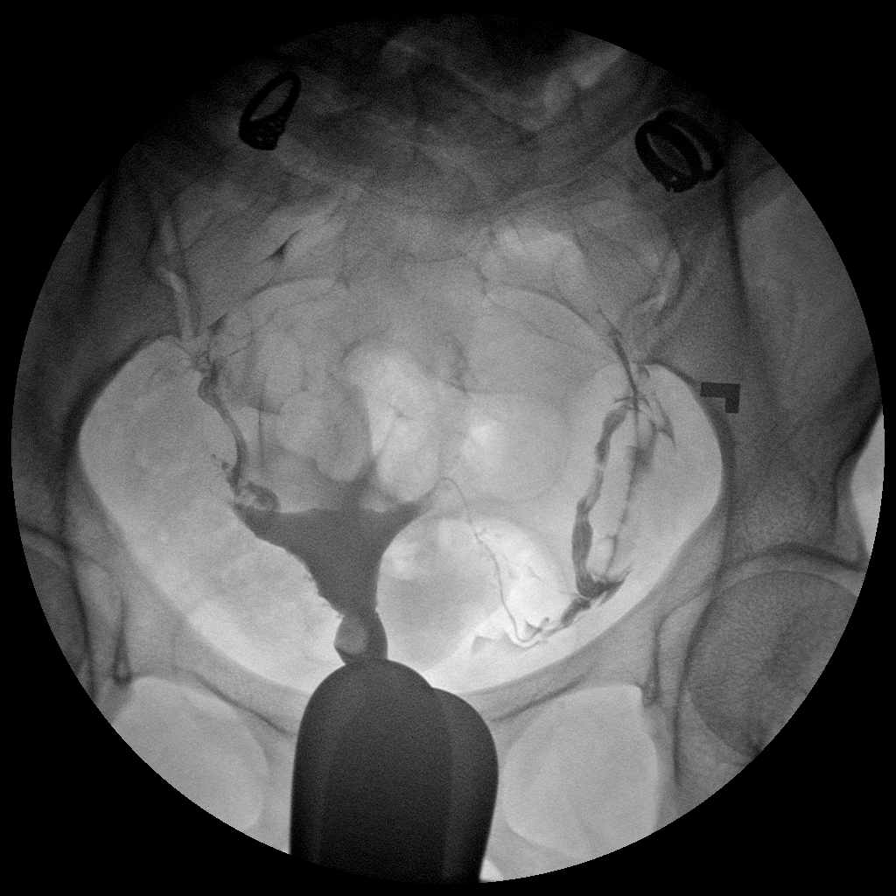

[Series 6: run · 1 of 1 slices shown (6 of 6)]
[im 1/1]
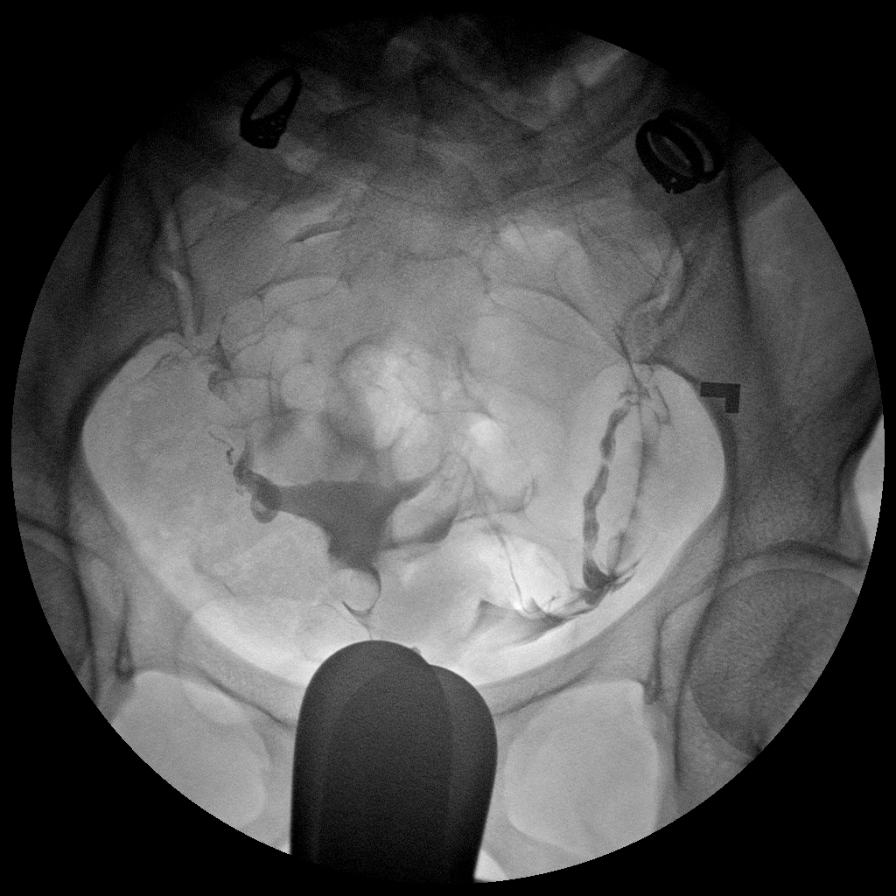

[6 of 6 positions shown; findings below may reference images not displayed]

FINDINGS: A filling defect is seen indenting the endometrial cavity in the
right lower uterine segment, which may represent a small fibroid or
endometrial polyp. No evidence of Mullerian duct anomaly.

Opacification of both fallopian tubes is demonstrated. Both
fallopian tubes are nondilated and normal in appearance.
Intraperitoneal spill of contrast from both fallopian tubes is
demonstrated.
IMPRESSION: Both fallopian tubes are patent.

Small filling defect involving the endometrial cavity in the right
lower uterine segment, which may represent a small submucosal
fibroid or endometrial polyp. Pelvic ultrasound suggested for
further evaluation.

## 2013-12-05 MED ORDER — IOHEXOL 300 MG/ML  SOLN
10.0000 mL | Freq: Once | INTRAMUSCULAR | Status: AC | PRN
  Administered 2013-12-05: 10 mL

## 2014-01-02 ENCOUNTER — Encounter (HOSPITAL_COMMUNITY): Payer: Self-pay | Admitting: Pharmacist

## 2014-01-11 ENCOUNTER — Encounter (HOSPITAL_COMMUNITY): Payer: Self-pay

## 2014-01-11 ENCOUNTER — Encounter (HOSPITAL_COMMUNITY)
Admission: RE | Admit: 2014-01-11 | Discharge: 2014-01-11 | Disposition: A | Discharge status: Home / Self Care | Payer: BC Managed Care – PPO | Source: Ambulatory Visit | Attending: Obstetrics and Gynecology | Admitting: Obstetrics and Gynecology

## 2014-01-11 DIAGNOSIS — Z01812 Encounter for preprocedural laboratory examination: Secondary | ICD-10-CM | POA: Insufficient documentation

## 2014-01-11 HISTORY — DX: Hypothyroidism, unspecified: E03.9

## 2014-01-11 LAB — CBC
HEMATOCRIT: 42.2 % (ref 36.0–46.0)
HEMOGLOBIN: 14.5 g/dL (ref 12.0–15.0)
MCH: 29.2 pg (ref 26.0–34.0)
MCHC: 34.4 g/dL (ref 30.0–36.0)
MCV: 84.9 fL (ref 78.0–100.0)
Platelets: 329 10*3/uL (ref 150–400)
RBC: 4.97 MIL/uL (ref 3.87–5.11)
RDW: 12.4 % (ref 11.5–15.5)
WBC: 8.4 10*3/uL (ref 4.0–10.5)

## 2014-01-11 LAB — BASIC METABOLIC PANEL
BUN: 18 mg/dL (ref 6–23)
CALCIUM: 9.5 mg/dL (ref 8.4–10.5)
CO2: 25 mEq/L (ref 19–32)
CREATININE: 0.68 mg/dL (ref 0.50–1.10)
Chloride: 102 mEq/L (ref 96–112)
GFR calc Af Amer: >90 mL/min (ref >90)
GLUCOSE: 115 mg/dL — AB (ref 70–99)
Potassium: 4.9 mEq/L (ref 3.7–5.3)
Sodium: 138 mEq/L (ref 137–147)

## 2014-01-11 NOTE — Patient Instructions (Signed)
[place]  01/11/2014   Your procedure is scheduled on:  01/16/14  Enter through the Main Entrance of [facility] [place] up the phone at the desk and dial [phone].   Call this number if you have problems the morning of surgery: [phone]   Remember:   Do not eat food:After Midnight.  Do not drink clear liquids: After Midnight.  Take these medicines the morning of surgery with A SIP OF WATER: Thyroid medication, hold Metformin 24hrs   Do not wear jewelry, make-up or nail polish.  Do not wear lotions, powders, or perfumes. You may wear deodorant.  Do not shave 48 hours prior to surgery.  Do not bring valuables to the hospital.  [facility] is not   responsible for any belongings or valuables brought to the hospital.  Contacts, dentures or bridgework may not be worn into surgery.  Leave suitcase in the car. After surgery it may be brought to your room.  For patients admitted to the hospital, checkout time is 11:00 AM the day of              discharge.   Patients discharged the day of surgery will not be allowed to drive             home.  Name and phone number of your driver: husband   Betsey Amen  Special Instructions:     Please read over the following fact sheets that you were given:   Surgical Site Infection Prevention

## 2014-01-15 MED ORDER — DEXTROSE 5 % IV SOLN
2.0000 g | INTRAVENOUS | Status: AC
  Administered 2014-01-16: 2 g via INTRAVENOUS
  Filled 2014-01-15: qty 2

## 2014-01-15 NOTE — H&P (Addendum)
11 yoG2P1 with infertility and vulvar lesion presents for surgical mngt.  PMHx: hypothyroid PSHx: c0section, thyroidectomy, lasik SHx: negative All:  Shellfish Meds:  PNV, thyroid, metformin, Ca/Vit D, pregnenalone  AF, VSS Gen - NAD Abd - soft, NT CV - RRR Lungs - clear Ext - NT, no edema PV - uterus mobile, NT. Small 21mm irregular shaped hyperpigmented lesion at posterior forchette  [place] - 35mm polypoid intracavitary mass at c-section scar  A/P:  1. Vulvar lesion  2. Infertility, endometrial mass H/S, D&C, resection of endometrial mass, vulvar bx R/b/a discussed, questions answered, informed consent rec pt to stop pregnenalone postoperatively

## 2014-01-16 ENCOUNTER — Encounter (HOSPITAL_COMMUNITY)
Admission: RE | Disposition: A | Discharge status: Home / Self Care | Payer: Self-pay | Source: Ambulatory Visit | Attending: Obstetrics and Gynecology

## 2014-01-16 ENCOUNTER — Encounter (HOSPITAL_COMMUNITY): Payer: Self-pay

## 2014-01-16 ENCOUNTER — Encounter (HOSPITAL_COMMUNITY): Payer: BC Managed Care – PPO | Admitting: Anesthesiology

## 2014-01-16 ENCOUNTER — Ambulatory Visit (HOSPITAL_COMMUNITY)
Admission: RE | Admit: 2014-01-16 | Discharge: 2014-01-16 | Disposition: A | Discharge status: Home / Self Care | Payer: BC Managed Care – PPO | Source: Ambulatory Visit | Attending: Obstetrics and Gynecology | Admitting: Obstetrics and Gynecology

## 2014-01-16 ENCOUNTER — Ambulatory Visit (HOSPITAL_COMMUNITY): Payer: BC Managed Care – PPO | Admitting: Anesthesiology

## 2014-01-16 DIAGNOSIS — K589 Irritable bowel syndrome without diarrhea: Secondary | ICD-10-CM | POA: Insufficient documentation

## 2014-01-16 DIAGNOSIS — N840 Polyp of corpus uteri: Secondary | ICD-10-CM | POA: Insufficient documentation

## 2014-01-16 DIAGNOSIS — J45909 Unspecified asthma, uncomplicated: Secondary | ICD-10-CM | POA: Insufficient documentation

## 2014-01-16 DIAGNOSIS — R0602 Shortness of breath: Secondary | ICD-10-CM | POA: Insufficient documentation

## 2014-01-16 DIAGNOSIS — C519 Malignant neoplasm of vulva, unspecified: Secondary | ICD-10-CM | POA: Insufficient documentation

## 2014-01-16 DIAGNOSIS — E785 Hyperlipidemia, unspecified: Secondary | ICD-10-CM | POA: Insufficient documentation

## 2014-01-16 DIAGNOSIS — N979 Female infertility, unspecified: Secondary | ICD-10-CM | POA: Insufficient documentation

## 2014-01-16 DIAGNOSIS — E119 Type 2 diabetes mellitus without complications: Secondary | ICD-10-CM | POA: Insufficient documentation

## 2014-01-16 DIAGNOSIS — E039 Hypothyroidism, unspecified: Secondary | ICD-10-CM | POA: Insufficient documentation

## 2014-01-16 HISTORY — PX: VULVA /PERINEUM BIOPSY: SHX319

## 2014-01-16 HISTORY — PX: DILATATION & CURETTAGE/HYSTEROSCOPY WITH TRUECLEAR: SHX6353

## 2014-01-16 LAB — PREGNANCY, URINE: Preg Test, Ur: NEGATIVE

## 2014-01-16 SURGERY — DILATATION & CURETTAGE/HYSTEROSCOPY WITH TRUCLEAR
Anesthesia: General | Site: Vagina

## 2014-01-16 MED ORDER — STERILE WATER FOR IRRIGATION IR SOLN
Status: DC | PRN
  Administered 2014-01-16: 1000 mL

## 2014-01-16 MED ORDER — FENTANYL CITRATE 0.05 MG/ML IJ SOLN
INTRAMUSCULAR | Status: AC
  Filled 2014-01-16: qty 2

## 2014-01-16 MED ORDER — FENTANYL CITRATE 0.05 MG/ML IJ SOLN
INTRAMUSCULAR | Status: DC | PRN
  Administered 2014-01-16: 100 ug via INTRAVENOUS

## 2014-01-16 MED ORDER — LIDOCAINE HCL (CARDIAC) 20 MG/ML IV SOLN
INTRAVENOUS | Status: DC | PRN
  Administered 2014-01-16: 100 mg via INTRAVENOUS

## 2014-01-16 MED ORDER — FENTANYL CITRATE 0.05 MG/ML IJ SOLN
25.0000 ug | INTRAMUSCULAR | Status: DC | PRN
Start: 2014-01-16 — End: 2014-01-16
  Administered 2014-01-16 (×2): 25 ug via INTRAVENOUS

## 2014-01-16 MED ORDER — LIDOCAINE HCL 1 % IJ SOLN
INTRAMUSCULAR | Status: AC
  Filled 2014-01-16: qty 20

## 2014-01-16 MED ORDER — MIDAZOLAM HCL 2 MG/2ML IJ SOLN
INTRAMUSCULAR | Status: AC
  Filled 2014-01-16: qty 2

## 2014-01-16 MED ORDER — KETOROLAC TROMETHAMINE 30 MG/ML IJ SOLN
INTRAMUSCULAR | Status: DC | PRN
  Administered 2014-01-16: 30 mg via INTRAVENOUS

## 2014-01-16 MED ORDER — LACTATED RINGERS IV SOLN
INTRAVENOUS | Status: DC
  Administered 2014-01-16 (×3): via INTRAVENOUS

## 2014-01-16 MED ORDER — MEPERIDINE HCL 25 MG/ML IJ SOLN: 6.2500 mg | INTRAMUSCULAR | Status: DC | PRN

## 2014-01-16 MED ORDER — ONDANSETRON HCL 4 MG/2ML IJ SOLN
INTRAMUSCULAR | Status: DC | PRN
  Administered 2014-01-16: 4 mg via INTRAVENOUS

## 2014-01-16 MED ORDER — LIDOCAINE HCL 1 % IJ SOLN
INTRAMUSCULAR | Status: DC | PRN
  Administered 2014-01-16: 10 mL

## 2014-01-16 MED ORDER — HYDROCODONE-IBUPROFEN 7.5-200 MG PO TABS: 1.0000 | ORAL_TABLET | Freq: Four times a day (QID) | ORAL | Status: DC | PRN

## 2014-01-16 MED ORDER — KETOROLAC TROMETHAMINE 30 MG/ML IJ SOLN: 15.0000 mg | Freq: Once | INTRAMUSCULAR | Status: DC | PRN

## 2014-01-16 MED ORDER — PROPOFOL 10 MG/ML IV BOLUS
INTRAVENOUS | Status: DC | PRN
  Administered 2014-01-16: 200 mg via INTRAVENOUS

## 2014-01-16 MED ORDER — METOCLOPRAMIDE HCL 5 MG/ML IJ SOLN: 10.0000 mg | Freq: Once | INTRAMUSCULAR | Status: DC | PRN

## 2014-01-16 MED ORDER — MIDAZOLAM HCL 2 MG/2ML IJ SOLN
INTRAMUSCULAR | Status: DC | PRN
  Administered 2014-01-16: 2 mg via INTRAVENOUS

## 2014-01-16 SURGICAL SUPPLY — 21 items
BLADE INCISOR TRUC PLUS 2.9 (ABLATOR) ×1 IMPLANT
CANISTERS HI-FLOW 3000CC (CANNISTER) IMPLANT
CATH ROBINSON RED A/P 16FR (CATHETERS) ×2 IMPLANT
CLOTH BEACON ORANGE TIMEOUT ST (SAFETY) ×2 IMPLANT
CONTAINER PREFILL 10% NBF 60ML (FORM) ×4 IMPLANT
DRAPE HYSTEROSCOPY (DRAPE) ×2 IMPLANT
DRSG TELFA 3X8 NADH (GAUZE/BANDAGES/DRESSINGS) ×2 IMPLANT
GLOVE BIO SURGEON STRL SZ 6.5 (GLOVE) ×2 IMPLANT
GLOVE BIOGEL PI IND STRL 7.0 (GLOVE) ×1 IMPLANT
GLOVE BIOGEL PI INDICATOR 7.0 (GLOVE) ×1
GOWN STRL REUS W/TWL LRG LVL3 (GOWN DISPOSABLE) ×4 IMPLANT
INCISOR TRUC PLUS BLADE 2.9 (ABLATOR) ×2
KIT HYSTEROSCOPY TRUCLEAR (ABLATOR) IMPLANT
MORCELLATOR RECIP TRUCLEAR 4.0 (ABLATOR) IMPLANT
NEEDLE SPNL 22GX3.5 QUINCKE BK (NEEDLE) ×2 IMPLANT
PACK VAGINAL MINOR WOMEN LF (CUSTOM PROCEDURE TRAY) ×2 IMPLANT
PAD OB MATERNITY 4.3X12.25 (PERSONAL CARE ITEMS) ×2 IMPLANT
SUT VICRYL RAPIDE 3 0 (SUTURE) ×2 IMPLANT
SYR CONTROL 10ML LL (SYRINGE) ×2 IMPLANT
TOWEL OR 17X24 6PK STRL BLUE (TOWEL DISPOSABLE) ×4 IMPLANT
WATER STERILE IRR 1000ML POUR (IV SOLUTION) ×2 IMPLANT

## 2014-01-16 NOTE — Transfer of Care (Signed)
Immediate Anesthesia Transfer of Care Note  Patient: Jacqueline Holmes  Procedure(s) Performed: Procedure(s): DILATATION & CURETTAGE/HYSTEROSCOPY WITH TRUCLEAR (N/A) VULVAR BIOPSY (N/A)  Patient Location: PACU  Anesthesia Type:General  Level of Consciousness: awake, alert  and sedated  Airway & Oxygen Therapy: Patient Spontanous Breathing and Patient connected to nasal cannula oxygen  Post-op Assessment: Report given to PACU RN and Post -op Vital signs reviewed and stable  Post vital signs: Reviewed and stable  Complications: No apparent anesthesia complications

## 2014-01-16 NOTE — Discharge Instructions (Addendum)
DISCHARGE INSTRUCTIONS: D&C / D&E The following instructions have been prepared to help you care for yourself upon your return home.   Personal hygiene:  Use sanitary pads for vaginal drainage, not tampons.  Shower the day after your procedure.  NO tub baths, pools or Jacuzzis for 2-3 weeks.  Wipe front to back after using the bathroom.  Activity and limitations:  Do NOT drive or operate any equipment for 24 hours. The effects of anesthesia are still present and drowsiness may result.  Do NOT rest in bed all day.  Walking is encouraged.  Walk up and down stairs slowly.  You may resume your normal activity in one to two days or as indicated by your physician.  Sexual activity: NO intercourse for at least 2 weeks after the procedure, or as indicated by your physician.  Diet: Eat a light meal as desired this evening. You may resume your usual diet tomorrow.  Return to work: You may resume your work activities in one to two days or as indicated by your doctor.  What to expect after your surgery: Expect to have vaginal bleeding/discharge for 2-3 days and spotting for up to 10 days. It is not unusual to have soreness for up to 1-2 weeks. You may have a slight burning sensation when you urinate for the first day. Mild cramps may continue for a couple of days. You may have a regular period in 2-6 weeks.  Call your doctor for any of the following:  Excessive vaginal bleeding, saturating and changing one pad every hour.  Inability to urinate 6 hours after discharge from hospital.  Pain not relieved by pain medication.  Fever of 100.4 F or greater.  Unusual vaginal discharge or odor.   Call for an appointment:    Patients signature: ______________________  Nurses signature ________________________  Support person's signature_______________________   DO NOT TAKE IBUPROFEN PRODUCTS UNTIL 2 PM TODAY.  YOU RECEIVED A SIMILAR MEDICATION IN THE OPERATING ROOM AT 8 AM.

## 2014-01-16 NOTE — Anesthesia Postprocedure Evaluation (Signed)
  Anesthesia Post-op Note  Patient: Jacqueline Holmes  Procedure(s) Performed: Procedure(s): DILATATION & CURETTAGE/HYSTEROSCOPY WITH TRUCLEAR (N/A) VULVAR BIOPSY (N/A)  Patient Location: PACU  Anesthesia Type:General  Level of Consciousness: awake, alert  and oriented  Airway and Oxygen Therapy: Patient Spontanous Breathing  Post-op Pain: none  Post-op Assessment: Post-op Vital signs reviewed, Patient's Cardiovascular Status Stable, Respiratory Function Stable, Patent Airway, No signs of Nausea or vomiting and Pain level controlled  Post-op Vital Signs: Reviewed and stable  Complications: No apparent anesthesia complications

## 2014-01-16 NOTE — Anesthesia Preprocedure Evaluation (Addendum)
Anesthesia Evaluation  Patient identified by MRN, date of birth, ID band Patient awake    Reviewed: Allergy & Precautions, H&P , NPO status , Patient's Chart, lab work & pertinent test results, reviewed documented beta blocker date and time   History of Anesthesia Complications Negative for: history of anesthetic complications  Airway Mallampati: III TM Distance: >3 FB   Mouth opening: Limited Mouth Opening  Dental  (+) Teeth Intact   Pulmonary shortness of breath (relates to being out of shape) and with exertion, asthma ,  breath sounds clear to auscultation  Pulmonary exam normal       Cardiovascular Rhythm:regular Rate:Normal  hyperlipidemia   Neuro/Psych  Headaches (migraines), negative psych ROS   GI/Hepatic negative GI ROS, Neg liver ROS, IBS   Endo/Other  diabetes, Type 2, Oral Hypoglycemic AgentsHypothyroidism   Renal/GU negative Renal ROS  Female GU complaint     Musculoskeletal   Abdominal   Peds  Hematology negative hematology ROS (+)   Anesthesia Other Findings   Reproductive/Obstetrics negative OB ROS                          Anesthesia Physical Anesthesia Plan  ASA: II  Anesthesia Plan: General LMA   Post-op Pain Management:    Induction:   Airway Management Planned:   Additional Equipment:   Intra-op Plan:   Post-operative Plan:   Informed Consent: I have reviewed the patients History and Physical, chart, labs and discussed the procedure including the risks, benefits and alternatives for the proposed anesthesia with the patient or authorized representative who has indicated his/her understanding and acceptance.   Dental Advisory Given  Plan Discussed with: CRNA and Surgeon  Anesthesia Plan Comments:         Anesthesia Quick Evaluation

## 2014-01-16 NOTE — Op Note (Signed)
NAMEALOISE, COPUS NO.:  [id number]  MEDICAL RECORD NO.:  [id number]  LOCATION:  [facility]                          FACILITY:  [facility]  PHYSICIAN:  Marylynn Pearson, MD    DATE OF BIRTH:  07/08/1971  DATE OF PROCEDURE: DATE OF DISCHARGE:  01/16/2014                              OPERATIVE REPORT   PREOPERATIVE DIAGNOSES: 1. Endometrial mass. 2. Vulvar lesion.  POSTOPERATIVE DIAGNOSES: 1. Endometrial mass. 2. Vulvar lesion.  PROCEDURE: 1. Cervical block. 2. Hysteroscopy. 3. Truclear resection of endometrial polyp. 4. D and C. 5. Vulvar biopsy.  SURGEON:  Marylynn Pearson, MD  ANESTHESIA:  General and local.  COMPLICATIONS:  None.  CONDITION:  Stable to recovery room.  DESCRIPTION OF PROCEDURE:  The patient was taken to the operating room. After anesthesia was found to be adequate, she was placed in the dorsal lithotomy position using Allen stirrups.  She was prepped and draped in sterile fashion.  In- and out catheter was used to drain her bladder. Bivalve speculum was placed in the vagina and 1 mL of 1% Xylocaine was placed at the anterior lip of the cervix.  Single-tooth tenaculum was attached to the anterior lip of the cervix and the remaining 9 mL was used to perform a cervical block.  The cervix was serially dilated using Pratt dilators and the hysteroscope was inserted.  She appeared to have a polypoid lesion just at the internal cervical os, and the anterior uterus.  Bilateral ostia were visualized and appeared normal.  Truclear resection hand piece was inserted to the hysteroscope and the polyp was resected to the base.  The remainder of the endometrial cavity appeared without lesions.  A gentle curetting was then performed to remove some fluffy endometrial tissue along the right posterior uterine wall. Hysteroscope was reinserted.  No other masses or abnormalities were seen throughout. Hysteroscope was removed.  Single-tooth tenaculum was removed  and the cervix was hemostatic.  Punch biopsy was performed at the hyperpigmented lesion at the posterior fourchette.  A figure-of-eight stitch was placed using Vicryl Rapide, hemostasis was achieved.  Specimens were passed off to be sent to Pathology.  She tolerated the procedure well.  Sponge, lap, needle, and instrument counts were correct x2.     Marylynn Pearson, MD     GA/MEDQ  D:  01/16/2014  T:  01/16/2014  Job:  [id number]

## 2014-01-16 NOTE — Anesthesia Procedure Notes (Signed)
Procedure Name: LMA Insertion Date/Time: 01/16/2014 7:26 AM Performed by: Nawal Burling, Sheron Nightingale Pre-anesthesia Checklist: Patient identified, Emergency Drugs available, Suction available, Patient being monitored and Timeout performed Patient Re-evaluated:Patient Re-evaluated prior to inductionOxygen Delivery Method: Circle system utilized Preoxygenation: Pre-oxygenation with 100% oxygen Intubation Type: IV induction LMA: LMA inserted LMA Size: 4.0 Number of attempts: 1 Placement Confirmation: breath sounds checked- equal and bilateral ETT to lip (cm): at lips. Dental Injury: Teeth and Oropharynx as per pre-operative assessment

## 2014-01-17 ENCOUNTER — Encounter (HOSPITAL_COMMUNITY): Payer: Self-pay | Admitting: Obstetrics and Gynecology

## 2014-01-17 LAB — GLUCOSE, CAPILLARY: GLUCOSE-CAPILLARY: 115 mg/dL — AB (ref 70–99)

## 2014-05-02 ENCOUNTER — Telehealth: Payer: Self-pay | Admitting: Family Medicine

## 2014-05-02 ENCOUNTER — Ambulatory Visit (INDEPENDENT_AMBULATORY_CARE_PROVIDER_SITE_OTHER): Payer: BC Managed Care – PPO | Admitting: Medical

## 2014-05-02 ENCOUNTER — Encounter: Payer: Self-pay | Admitting: Medical

## 2014-05-02 VITALS — BP 102/70 | HR 80 | Temp 98.0°F | Resp 14 | Wt 167.0 lb

## 2014-05-02 DIAGNOSIS — J329 Chronic sinusitis, unspecified: Secondary | ICD-10-CM

## 2014-05-02 MED ORDER — AMOXICILLIN 500 MG PO TABS: ORAL_TABLET | ORAL | Status: DC

## 2014-05-02 MED ORDER — BECLOMETHASONE DIPROPIONATE 80 MCG/ACT NA AERS: 1.0000 | INHALATION_SPRAY | Freq: Two times a day (BID) | NASAL | Status: DC

## 2014-05-02 NOTE — Telephone Encounter (Signed)
Jacqueline Holmes with [facility] called and stated that the nasal spray she was given today even with coupon is still $77.00. She states that her insurance with not cover at all. She wants to know if you want to change that to flonase. Please advise pharmacist. She can be reached at [phone].

## 2014-05-02 NOTE — Telephone Encounter (Signed)
Should be for [facility]

## 2014-05-02 NOTE — Progress Notes (Signed)
Subjective:  Jacqueline Holmes is a 43 y.o. female who presents for possible sinus infection.  Symptoms include few weeks of worsening head congestion, but past few days with extreme fatigue, headaches, sinus pain.  Leading up to now, has had few weeks of head congestion, some headaches, seasonal allergies.  Has some teeth pain, some decreased smell sensation.  Denies fever, no NVD, no ear pain, no sore throat.  Past history is significant for getting sinus infections about once yearly this time of year.. Patient is a non-smoker.  Using Ibuprofen, OTC sinus relief for symptoms.  Denies sick contacts.  In the past round of antibiotics usually knocks this out, sometimes has had to have steroid pack.  Has used fllonase without improvement.  No other aggravating or relieving factors.  No other c/o.  ROS as in subjective   Objective: Filed Vitals:   05/02/14 1147  BP: 102/70  Pulse: 80  Temp: 98 F (36.7 C)  Resp: 14    General appearance: Alert, WD/WN, no distress                             Skin: warm, no rash                           Head: +mild maxillary sinus tenderness,                            Eyes: conjunctiva normal, corneas clear, PERRLA                            Ears: pearly TMs, external ear canals normal                          Nose: septum midline, turbinates swollen, with erythema and clear discharge             Mouth/throat: MMM, tongue normal, mild pharyngeal erythema                           Neck: supple, no adenopathy, no thyromegaly, nontender                          Heart: RRR, normal S1, S2, no murmurs                         Lungs: CTA bilaterally, no wheezes, rales, or rhonchi      Assessment and Plan:   Encounter Diagnosis  Name Primary?  . Sinusitis Yes     Prescription given for Amoxicillin, Qnasal.  Can use OTC Mucinex for congestion.  Tylenol or Ibuprofen OTC for fever and malaise.  Discussed symptomatic relief, nasal saline flush, and call or return if  worse or not improving in 2-3 days.

## 2014-05-03 ENCOUNTER — Other Ambulatory Visit: Payer: Self-pay | Admitting: Medical

## 2014-05-03 MED ORDER — FLUTICASONE PROPIONATE 50 MCG/ACT NA SUSP: 2.0000 | Freq: Every day | NASAL | Status: DC

## 2014-10-01 ENCOUNTER — Encounter: Payer: Self-pay | Admitting: Medical

## 2014-12-25 ENCOUNTER — Ambulatory Visit: Payer: BC Managed Care – PPO

## 2014-12-25 ENCOUNTER — Ambulatory Visit (INDEPENDENT_AMBULATORY_CARE_PROVIDER_SITE_OTHER): Payer: BC Managed Care – PPO

## 2014-12-25 ENCOUNTER — Ambulatory Visit (INDEPENDENT_AMBULATORY_CARE_PROVIDER_SITE_OTHER): Payer: BC Managed Care – PPO | Admitting: Podiatry

## 2014-12-25 ENCOUNTER — Encounter: Payer: Self-pay | Admitting: Podiatry

## 2014-12-25 VITALS — BP 127/91 | Resp 18

## 2014-12-25 DIAGNOSIS — M722 Plantar fascial fibromatosis: Secondary | ICD-10-CM

## 2014-12-25 DIAGNOSIS — M779 Enthesopathy, unspecified: Secondary | ICD-10-CM

## 2014-12-25 NOTE — Progress Notes (Signed)
   Subjective:    Patient ID: Jacqueline Holmes, female    DOB: [birth year], 43 y.o.   MRN: [id number]  HPI  44 year old female presents the office today with complaints of bilateral heel pain as well as pain in the ball of her foot with a right greater than left. She states the majority of her pain at this time is in the ball the foot particularly over the second toe joint. She states that she recently returned from [place] where she was doing a lot of walking which seem to aggravate the pain in the front of the foot. She has had ongoing plantar fasciitis previously for which she had orthotics made in she is requesting a new. Orthotics at this time. She denies any specific injury or trauma to the area. She denies any recent swelling or redness overlying the area. No other complaints at this time.    Review of Systems  All other systems reviewed and are negative.      Objective:   Physical Exam AAO x3, NAD DP/PT pulses palpable bilaterally, CRT less than 3 seconds Protective sensation intact with Simms Weinstein monofilament, vibratory sensation intact, Achilles tendon reflex intact At this time there is slight tenderness to palpation over the plantar medial tubercle of the calcaneus at the insertion of the plantar fascia bilaterally. There is no pain along the course of plantar fascial in the arch of the foot and the plantar fascial appears to be intact. There is no pain with lateral compression of the calcaneus or pain with vibratory sensation. No pain along the posterior aspect of the calcaneus or along the course/insertion of the Achilles tendon. On the right foot there is mild tenderness directly overlying the second MTPJ area. There is no areas of pinpoint bony tenderness or pain the vibratory sensation. There is no overlying edema, erythema, increase in warmth. There is mild symptoms on left foot consisting area however is not as significant as the right. No other areas of tenderness to bilateral  lower extremities. No other areas of edema, erythema, increase in warmth. MMT 5/5, ROM WNL No open lesions or pre-ulceration lesions No pain with calf compression, swelling, warmth, erythema.         Assessment & Plan:  44 year old female with plantar fasciitis, capsulitis. -X-rays were obtained and reviewed with the patient. -Treatment options were discussed the patient including alternatives, risks, complications. -In regards to the capsulitis on the right foot discussed various treatment options. At this time she is requesting a steroid injection into the area. Under sterile preparation a total of 1cc of the combination of dexamethasone phosphate and 0.5% Marcaine plain was infiltrated into the area of maximal tenderness into and around the second MTPJ. A Band-Aid was applied. Patient tolerated the injection well without complications. Post injection care was discussed the patient. -Due to the discomfort in the forefoot as well as the heel pain patient likely benefit from orthotics. The patient was scanned for orthotics are sent to [facility] labs. -For plantar fasciitis discussed stretching exercises as well as icing. Will Lachman steroid injection to this area at this time. -All up once the orthotics arrive or sooner should any problems arise. In the meantime, encouraged call the office with any questions, concerns, change in symptoms.

## 2015-01-16 ENCOUNTER — Ambulatory Visit (INDEPENDENT_AMBULATORY_CARE_PROVIDER_SITE_OTHER): Payer: BC Managed Care – PPO | Admitting: Podiatry

## 2015-01-16 ENCOUNTER — Ambulatory Visit (INDEPENDENT_AMBULATORY_CARE_PROVIDER_SITE_OTHER): Payer: BC Managed Care – PPO

## 2015-01-16 DIAGNOSIS — M779 Enthesopathy, unspecified: Secondary | ICD-10-CM

## 2015-01-16 DIAGNOSIS — R52 Pain, unspecified: Secondary | ICD-10-CM

## 2015-01-16 DIAGNOSIS — D3610 Benign neoplasm of peripheral nerves and autonomic nervous system, unspecified: Secondary | ICD-10-CM

## 2015-01-16 MED ORDER — MELOXICAM 7.5 MG PO TABS: 7.5000 mg | ORAL_TABLET | Freq: Every day | ORAL | Status: DC

## 2015-01-16 NOTE — Progress Notes (Signed)
Patient ID: Jacqueline Holmes, female   DOB: 02/21/1971, 44 y.o.   MRN: [id number]  Subjective: 44 year old phenol returns the office today for follow-up evaluation of bilateral foot pain in the ball of her foot. She is also here to pick up orthotics. Since last appointment she states that she is unable to localize the pain more between the third and fourth toes of bilateral feet. She states that she has numbness and sharp pain to the third and fourth toes intermittently. She states the injection I last appointment do not help. She continues to have pain after prolonged standing or ambulation. She denies any swelling or redness around the area. She denies any specific injury or trauma to the area. Denies any systemic complaints such as fevers, chills, nausea, vomiting. No other complaints at this time in no acute changes since last appointment.  Objective: AAO 3, NAD DP/PT pulses palpable, CRT less than 3 seconds Protective sensation intact with Simms Weinstein monofilament, vibratory sensation intact, Achilles tendon reflex intact. At today's appointment there is tenderness to palpation overlying the third interspaces bilaterally with reproduction of symptoms of numbness and sharp pain to the third and fourth digits. There is no palpable neuroma identified and there is no pain with medial to lateral compression of the metatarsals. There does appear to be some tenderness on both the third and fourth metatarsals with vibratory sensation is no pinpoint bony tenderness. At this time there is no discomfort over the second MTPJ bilaterally. There is no pain with MTPJ range of motion. There is no pain on the plantar medial tubercle of the calcaneus at the insertion of the plantar fascial bilaterally. There is no other areas of pinpoint bony tenderness or pain with vibratory sensation of bilateral lower extremity is nervous no overlying edema, erythema, increased warmth. MMT 5/5, ROM WNL No open lesions or pre-ulcer  lesions identified. No pain with calf compression, swelling, warmth, erythema.  Assessment: 44 year old female with likely bilateral neuroma/capsulitis.  Plan: -Treatment options were discussed with the patient clear alternatives, risks, competitions. -Repeat x-rays were performed to rule out stress fracture. There is no evidence of stress fracture this time. -Discussed possible steroid injection into the third interspace bilaterally. At this time she does have tenderness with vibratory sensation overlying the metatarsals. Decided to hold off a steroid at this time. If symptoms persist at next appointment we'll perform an injection and possibly repeat x-rays at needed. -Dispensed neuroma pads to apply to orthotics. -The orthotics do not appear to be fitting in her shoes lengthwise. She presents wearing open back shoe. Discussed that these are not the best shoes orthotics. I discussed with her shoe gear modifications which may help relieve some of her symptoms as well. Recommended her to try purchasing new shoes and we can adjust the orthotics if needed. -Prescribed meloxicam. Discussed side effects the medication and directed to stop immediately should any occur and call the office. -Follow-up in 3 weeks or sooner if any palms are to arise. In the meantime encouraged to call the office with any questions, concerns, change in symptoms.

## 2015-01-16 NOTE — Patient Instructions (Addendum)
WEARING INSTRUCTIONS FOR ORTHOTICS  Don't expect to be comfortable wearing your orthotic devices for the first time.  Like eyeglasses, you may be aware of them as time passes, they will not be uncomfortable and you will enjoy wearing them.  FOLLOW THESE INSTRUCTIONS EXACTLY!  Wear your orthotic devices for:       Not more than 1 hour the first day.       Not more than 2 hours the second day.       Not more than 3 hours the third day and so on.        Or wear them for as long as they feel comfortable.       If you experience discomfort in your feet or legs take them out.  When feet & legs feel       better, put them back in.  You do need to be consistent and wear them a little        everyday. 2.   If at any time the orthotic devices become acutely uncomfortable before the       time for that particular day, STOP WEARING THEM. 3.   On the next day, do not increase the wearing time. 4.   Subsequently, increase the wearing time by 15-30 minutes only if comfortable to do       so. 5.   You will be seen by your doctor about 2-4 weeks after you receive your orthotic       devices, at which time you will probably be wearing your devices comfortably        for about 8 hours or more a day. 6.   Some patients occasionally report mild aches or discomfort in other parts of the of       body such as the knees, hips or back after 3 or 4 consecutive hours of wear.  If this       is the case with you, do not extend your wearing time.  Instead, cut it back an hour or       two.  In all likelihood, these symptoms will disappear in a short period of time as your       body posture realigns itself and functions more efficiently. 7.   It is possible that your orthotic device may require some small changes or adjustment       to improve their function or make them more comfortable.   This is usually not done       before one to three months have elapsed.  These adjustments are made in        accordance with  the changed position your feet are assuming as a result of       improved biomechanical function. 8.   In women's shoes, it's not unusual for your heel to slip out of the shoe, particularly if       they are step-in-shoes.  If this is the case, try other shoes or other styles.  Try to       purchase shoes which have deeper heal seats or higher heel counters. 9.   Squeaking of orthotics devices in the shoes is due to the movement of the devices       when they are functioning normally.  To eliminate squeaking, simply dust some       baby powder into your shoes before inserting the devices.  If this does not work,          apply soap or wax to the edges of the orthotic devices or put a tissue into the shoes. 10. It is important that you follow these directions explicitly.  Failure to do so will simply       prolong the adjustment period or create problems which are easily avoided.  It makes       no difference if you are wearing your orthotic devices for only a few hours after        several months, so long as you are wearing them comfortably for those hours. 11. If you have any questions or complaints, contact our office.  We have no way of       knowing about your problems unless you tell us.  If we do not hear from you, we will       assume that you are proceeding well. Morton's Neuroma in Sports  (Interdigital Plantar Neuroma) Morton's neuroma is a condition of the nervous system that results in pain or loss of feeling in the toes. The disease is caused by the bones of the foot squeezing the nerve that runs between two toes (interdigital nerve). The third and fourth toes are most likely to be affected by this disease. SYMPTOMS   Tingling, numbness, burning, or electric shocks in the front of the foot, often involving the third and fourth toes, although it may involve any other pair of toes.  Pain and tenderness in the front of the foot, that gets worse when walking.  Pain that gets  worse when pressure is applied to the foot (wearing shoes).  Severe pain in the front of the foot, when standing on the front of the foot (on tiptoes), such as with running, jumping, pivoting, or dancing. CAUSES  Morton's neuroma is caused by swelling of the nerve between two toes. This swelling causes the nerve to be pinched between the bones of the foot. RISK INCREASES WITH:  Recurring foot or ankle injuries.  Poor fitting or worn shoes, with minimal padding and shock absorbers.  Loose ligaments of the foot, causing thickening of the nerve.  Poor foot strength and flexibility. PREVENTION  Warm up and stretch properly before activity.  Maintain physical fitness:  Foot and ankle flexibility.  Muscle strength and endurance.  Cardiovascular fitness.  Wear properly fitted and padded shoes.  Wear arch supports (orthotics), when needed. PROGNOSIS  If treated properly, Morton's neuroma can usually be cured with non-surgical treatment. For certain cases, surgery may be needed. RELATED COMPLICATIONS  Permanent numbness and pain in the foot.  Inability to participate in athletics, because of pain. TREATMENT Treatment first involves stopping any activities that make the symptoms worse. The use of ice and medicine will help reduce pain and inflammation. Wearing shoes with a wide toe box, and an orthotic arch support or metatarsal bar, may also reduce pain. Your caregiver may give you a corticosteroid injection, to further reduce inflammation. If non-surgical treatment is unsuccessful, surgery may be needed. Surgery to fix Morton's neuroma is often performed as an outpatient procedure, meaning you can go home the same day as the surgery. The procedure involves removing the source of pressure on the nerve. If it is necessary to remove the nerve, you can expect persistent numbness. MEDICATION  If pain medicine is needed, nonsteroidal anti-inflammatory medicines (aspirin and ibuprofen), or  other minor pain relievers (acetaminophen), are often advised.  Do not take pain medicine for 7 days before surgery.  Prescription pain relievers are usually prescribed only after surgery.  Use only as directed and only as much as you need.  Corticosteroid injections are used in extreme cases, to reduce inflammation. These injections should be done only if necessary, because they may be given only a limited number of times. HEAT AND COLD  Cold treatment (icing) should be applied for 10 to 15 minutes every 2 to 3 hours for inflammation and pain, and immediately after activity that aggravates your symptoms. Use ice packs or an ice massage.  Heat treatment may be used before performing stretching and strengthening activities prescribed by your caregiver, physical therapist, or athletic trainer. Use a heat pack or a warm water soak. SEEK MEDICAL CARE IF:   Symptoms get worse or do not improve in 2 weeks, despite treatment.  After surgery you develop increasing pain, swelling, redness, increased warmth, bleeding, drainage of fluids, or fever.  New, unexplained symptoms develop. (Drugs used in treatment may produce side effects.) Document Released: 09/23/2005 Document Revised: 02/08/2012 Document Reviewed: 02/28/2009 [facility] Patient Information 2015 [place], [place]. This information is not intended to replace advice given to you by your health care provider. Make sure you discuss any questions you have with your health care provider.

## 2015-02-08 ENCOUNTER — Ambulatory Visit: Payer: BC Managed Care – PPO | Admitting: Podiatry

## 2015-02-19 ENCOUNTER — Ambulatory Visit: Payer: BC Managed Care – PPO | Admitting: Podiatry

## 2015-03-01 ENCOUNTER — Encounter: Payer: Self-pay | Admitting: Podiatry

## 2015-03-01 ENCOUNTER — Ambulatory Visit (INDEPENDENT_AMBULATORY_CARE_PROVIDER_SITE_OTHER): Payer: BC Managed Care – PPO | Admitting: Podiatry

## 2015-03-01 VITALS — BP 127/80 | HR 92 | Resp 11

## 2015-03-01 DIAGNOSIS — R52 Pain, unspecified: Secondary | ICD-10-CM

## 2015-03-01 DIAGNOSIS — D3610 Benign neoplasm of peripheral nerves and autonomic nervous system, unspecified: Secondary | ICD-10-CM | POA: Diagnosis not present

## 2015-03-01 DIAGNOSIS — M779 Enthesopathy, unspecified: Secondary | ICD-10-CM | POA: Diagnosis not present

## 2015-03-04 NOTE — Progress Notes (Signed)
Patient ID: Jacqueline Holmes, female   DOB: [birth year], 44 y.o.   MRN: [id number]  Subjective: 44 year old female returns the office they for follow-up evaluation of bilateral forefoot pain. She states that she's had more discomfort to the side of the second toe between the second and third toe. She intimately gets numbness to the area. She is requesting a steroid injection in this area. She denies any swelling or any redness to the area. She denies any history of injury or trauma. She's been continuing with orthotics which seem to help some but do not provide complete relief. No other complaints at this time.  Objective: AAO 3, NAD DP/PT pulses palpable, CRT less than 3 seconds Protective sensation intact with Simms Weinstein monofilament, vibratory sensation intact, Achilles tendon reflex intact. There is mild topalpation overlying the second MTPJ however this injury more localized to the second and third digits. There is some mild numbness to the second third toes upon compression of the interspaces. There is no palpable neuroma identified. There is no reproduction of symptoms upon meals lateral compression of the metatarsals. There is no areas of pinpoint bony tenderness or pain with vibratory sensation of bilateral lower extremities and there is no overlying edema, erythema, increase in warmth. There is no pain with MTPJ range of motion and no crepitance. MMT 5/5, ROM WNL No open lesions or pre-ulcerative lesions identified bilaterally. No pain with calf compression, swelling, warmth, erythema.  Assessment: 44 year old female bilateral second MTPJ, second interspace pain; likely capsulitis versus possible neuroma  Plan: -Treatment options were discussed the patient including alternatives, risks, complications. -At this time the patient is requesting steroid injection into the area bilaterally. I discussed risks, complications the injection for which she understands and verbally consents. Under  sterile conditions a total of 1.5 mL mixture of dexamethasone and  0.5% Marcaine plain was infiltrated along the 2nd interspace bilaterally at the site of maximal tenderness. Patient tolerated the injection well without complications. Post injection care was discussed the patient. -Upon inspection the orthotics this. The cut out for the second metatarsal ends right at the level metatarsal head. I believe this should be extended slightly more proximal to help further offload metatarsal head. These are sent back to [facility]. -Follow-up after orthotics arrive or sooner if any problems are to arise. In the meantime encouraged to call the office with any questions, concerns, change in symptoms.

## 2015-03-06 ENCOUNTER — Other Ambulatory Visit: Payer: Self-pay | Admitting: Podiatry

## 2015-03-06 ENCOUNTER — Other Ambulatory Visit: Payer: Self-pay | Admitting: *Deleted

## 2015-03-06 MED ORDER — MELOXICAM 7.5 MG PO TABS: 7.5000 mg | ORAL_TABLET | Freq: Every day | ORAL | Status: DC

## 2015-03-20 ENCOUNTER — Ambulatory Visit (INDEPENDENT_AMBULATORY_CARE_PROVIDER_SITE_OTHER): Payer: BC Managed Care – PPO | Admitting: Podiatry

## 2015-03-20 ENCOUNTER — Encounter: Payer: Self-pay | Admitting: Podiatry

## 2015-03-20 VITALS — BP 144/79 | HR 101 | Resp 12

## 2015-03-20 DIAGNOSIS — M779 Enthesopathy, unspecified: Secondary | ICD-10-CM | POA: Diagnosis not present

## 2015-03-21 ENCOUNTER — Other Ambulatory Visit: Payer: Self-pay | Admitting: Obstetrics and Gynecology

## 2015-03-22 ENCOUNTER — Encounter: Payer: Self-pay | Admitting: Podiatry

## 2015-03-22 NOTE — Progress Notes (Signed)
Patient ID: Jacqueline Holmes, female   DOB: [birth year], 44 y.o.   MRN: [id number]  Subjective: 44 year old female returns the office they for follow up evaluation of bilateral foot pain. She states that since last appointment she has gone to [employer] she states that her feet are doing well. She states that after the end of the day after walking all day she did have some discomfort to her foot however they did not hurt. She states that she wore her tennis shoes and sandals on the trip which did not cause any problems. Denies any swelling, redness, tingling or numbness. She does believe that the injection at last appointment did help. Denies any systemic complaints such as fevers, chills, nausea, vomiting. No acute changes since last appointment, and no other complaints at this time.   Objective: AAO x3, NAD DP/PT pulses palpable bilaterally, CRT less than 3 seconds Protective sensation intact with Simms Weinstein monofilament, vibratory sensation intact, Achilles tendon reflex intact At this time there is no tender to palpation over the second metatarsal phalangeal joint or in the interspaces. There is no areas of pinpoint bony tenderness or pain with vibratory sensation to bilateral lower extremities. MMT 5/5, ROM WNL. No edema, erythema, increase in warmth to bilateral lower extremities.  No open lesions or pre-ulcerative lesions.  No pain with calf compression, swelling, warmth, erythema  Assessment: 44 year old female with resolving bilateral second MTPJ, second interspace pain.  Plan: -All treatment options discussed with the patient including all alternatives, risks, complications.  -Patient picked up orthotics which were modified. Recommended her to continue with his orthotics and the break them in which was discussed with her. -Anti-inflammatory medications as needed. -Monitor for any recurrence. Follow up in 6 weeks to check the orthotics or sooner if any problems are to arise. -Patient  encouraged to call the office with any questions, concerns, change in symptoms.

## 2015-03-26 LAB — CYTOLOGY - PAP

## 2015-05-01 ENCOUNTER — Ambulatory Visit: Payer: BC Managed Care – PPO | Admitting: Podiatry

## 2015-11-28 ENCOUNTER — Other Ambulatory Visit (HOSPITAL_COMMUNITY): Payer: Self-pay | Admitting: Chiropractic Medicine

## 2015-11-28 ENCOUNTER — Ambulatory Visit (HOSPITAL_COMMUNITY)
Admission: RE | Admit: 2015-11-28 | Discharge: 2015-11-28 | Disposition: A | Discharge status: Home / Self Care | Payer: BC Managed Care – PPO | Source: Ambulatory Visit | Attending: Chiropractic Medicine | Admitting: Chiropractic Medicine

## 2015-11-28 DIAGNOSIS — M542 Cervicalgia: Secondary | ICD-10-CM | POA: Insufficient documentation

## 2015-11-28 DIAGNOSIS — M4184 Other forms of scoliosis, thoracic region: Secondary | ICD-10-CM | POA: Diagnosis not present

## 2015-11-28 DIAGNOSIS — M546 Pain in thoracic spine: Secondary | ICD-10-CM | POA: Diagnosis present

## 2015-11-28 IMAGING — CR DG CERVICAL SPINE COMPLETE 4+V
7 series · 7 of 7 positions shown · non-contrast
Comparison: None.

CLINICAL DATA: Neck pain

EXAM:
CERVICAL SPINE - COMPLETE 4+ VIEW

[w c-spine lat]
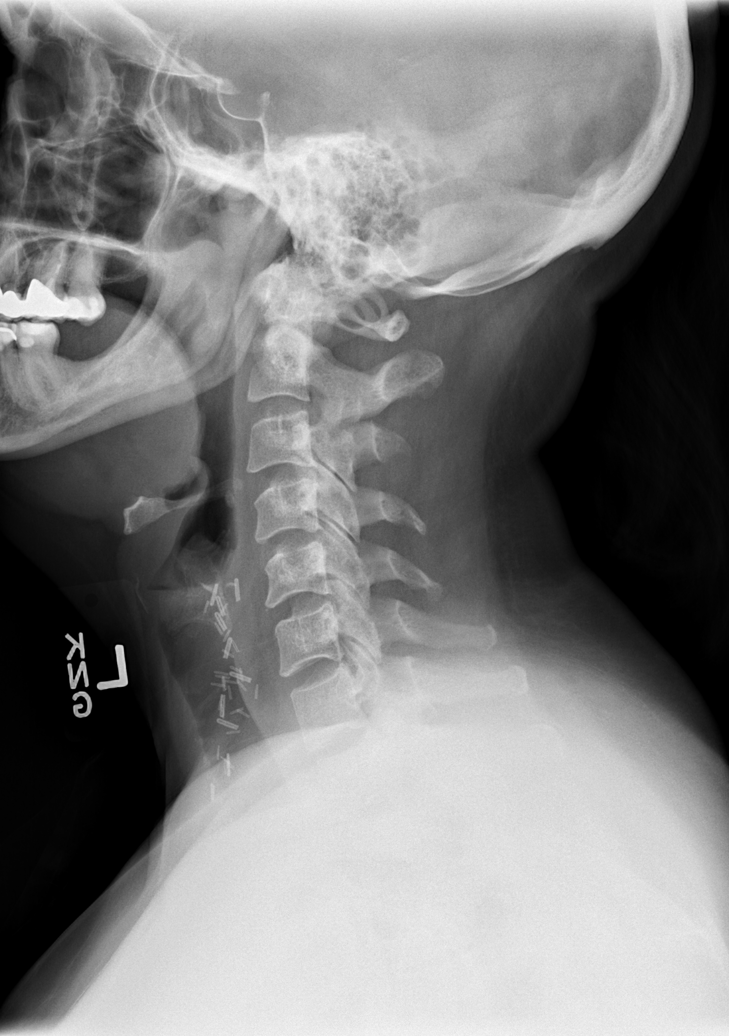

[w c-spine oblique (1 of 2)]
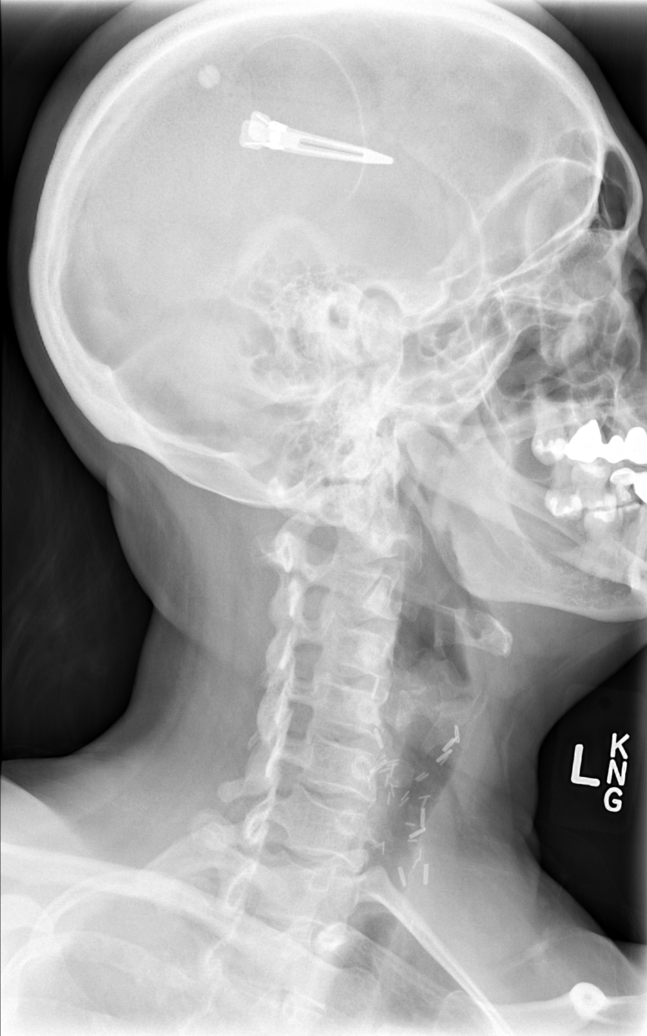

[w c-spine oblique (2 of 2)]
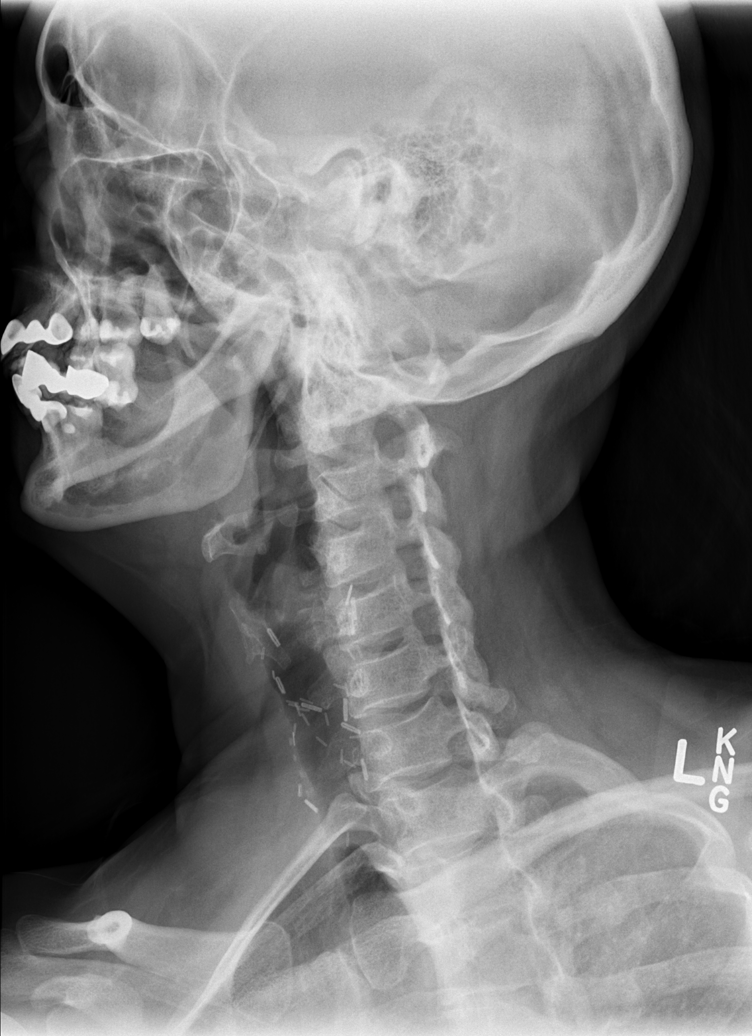

[w c-spine a.p. *]
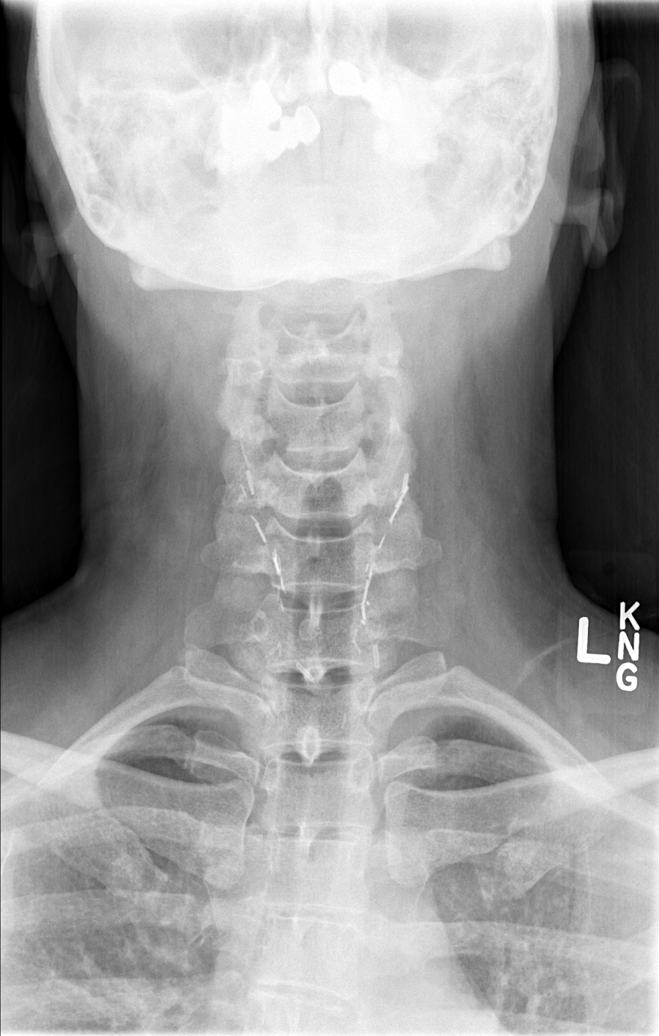

[w c-spine odontoid * (1 of 2)]
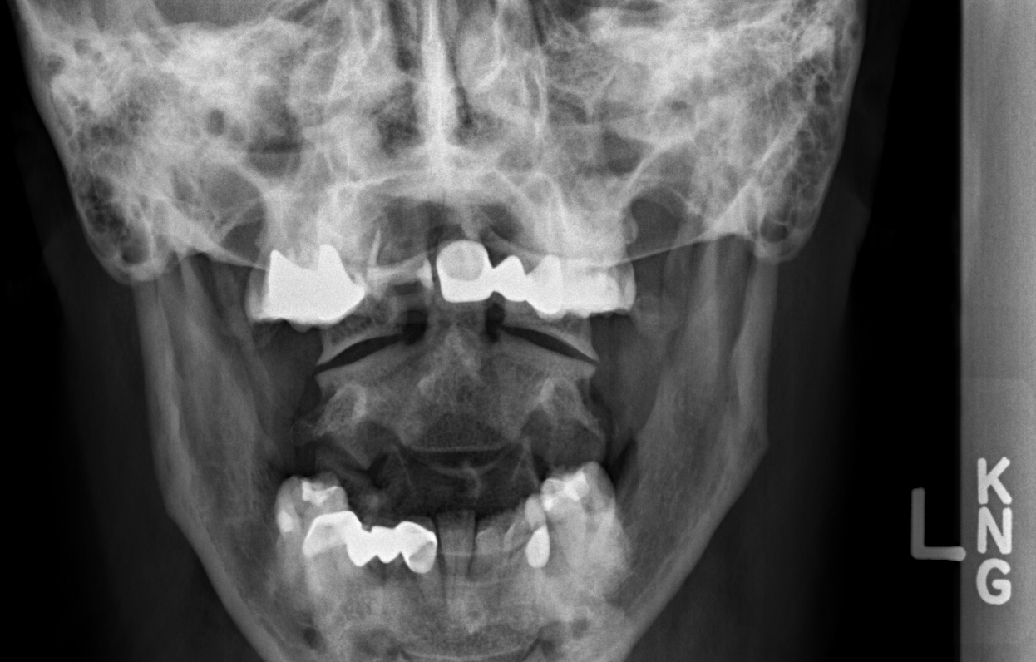

[w c-spine odontoid * (2 of 2)]
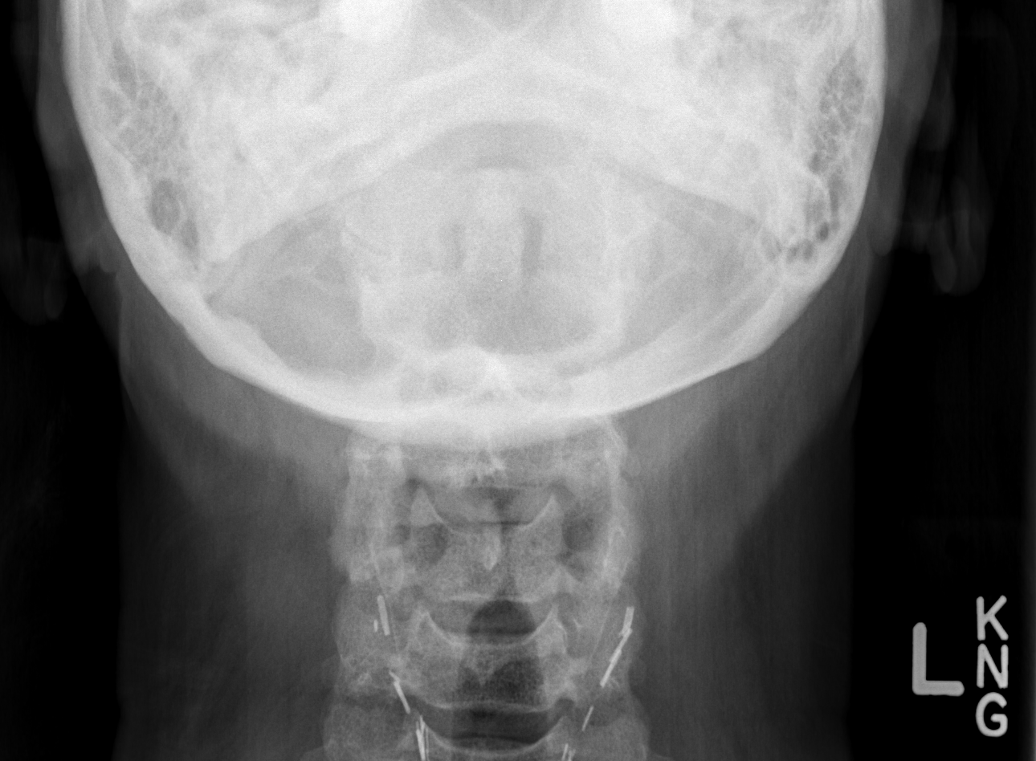

[w swimmers view *]
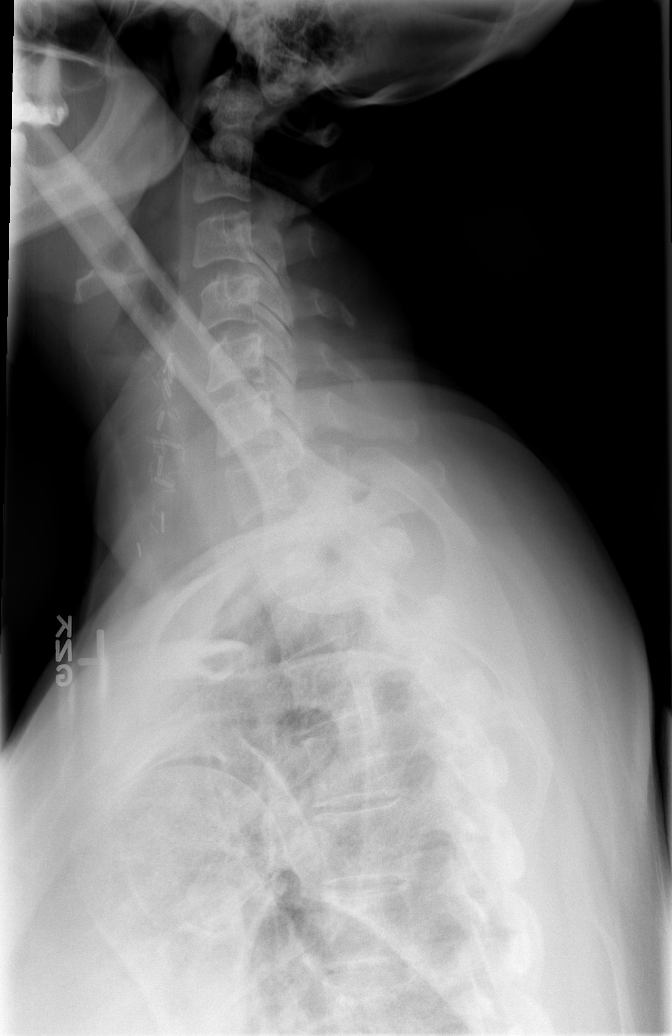

[7 of 7 positions shown; findings below may reference images not displayed]

FINDINGS: Surgical clips from prior presumed thyroidectomy. Normal alignment.
Prevertebral soft tissues are normal. Disc spaces are maintained. No
neural foraminal narrowing. No fracture.
IMPRESSION: No bony abnormality.

## 2015-11-28 IMAGING — CR DG THORACIC SPINE 2V
2 series · 2 of 2 positions shown · non-contrast
Comparison: Chest x-ray [DATE]

CLINICAL DATA: Neck and upper back pain, chronic.  No known injury.

EXAM:
THORACIC SPINE 2 VIEWS

[t t-spine a.p.]
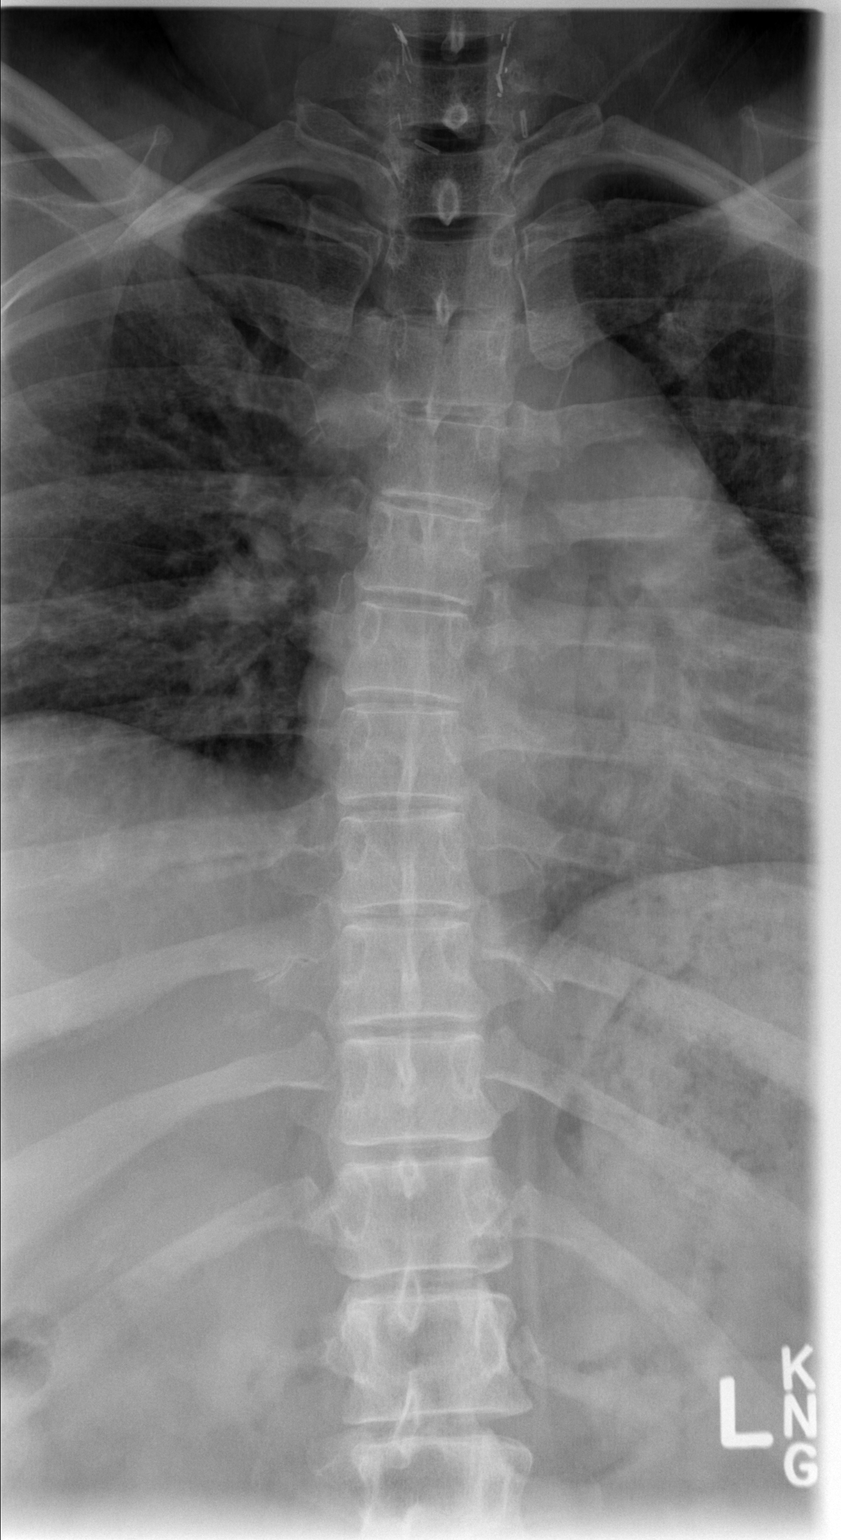

[t t-spine lat]
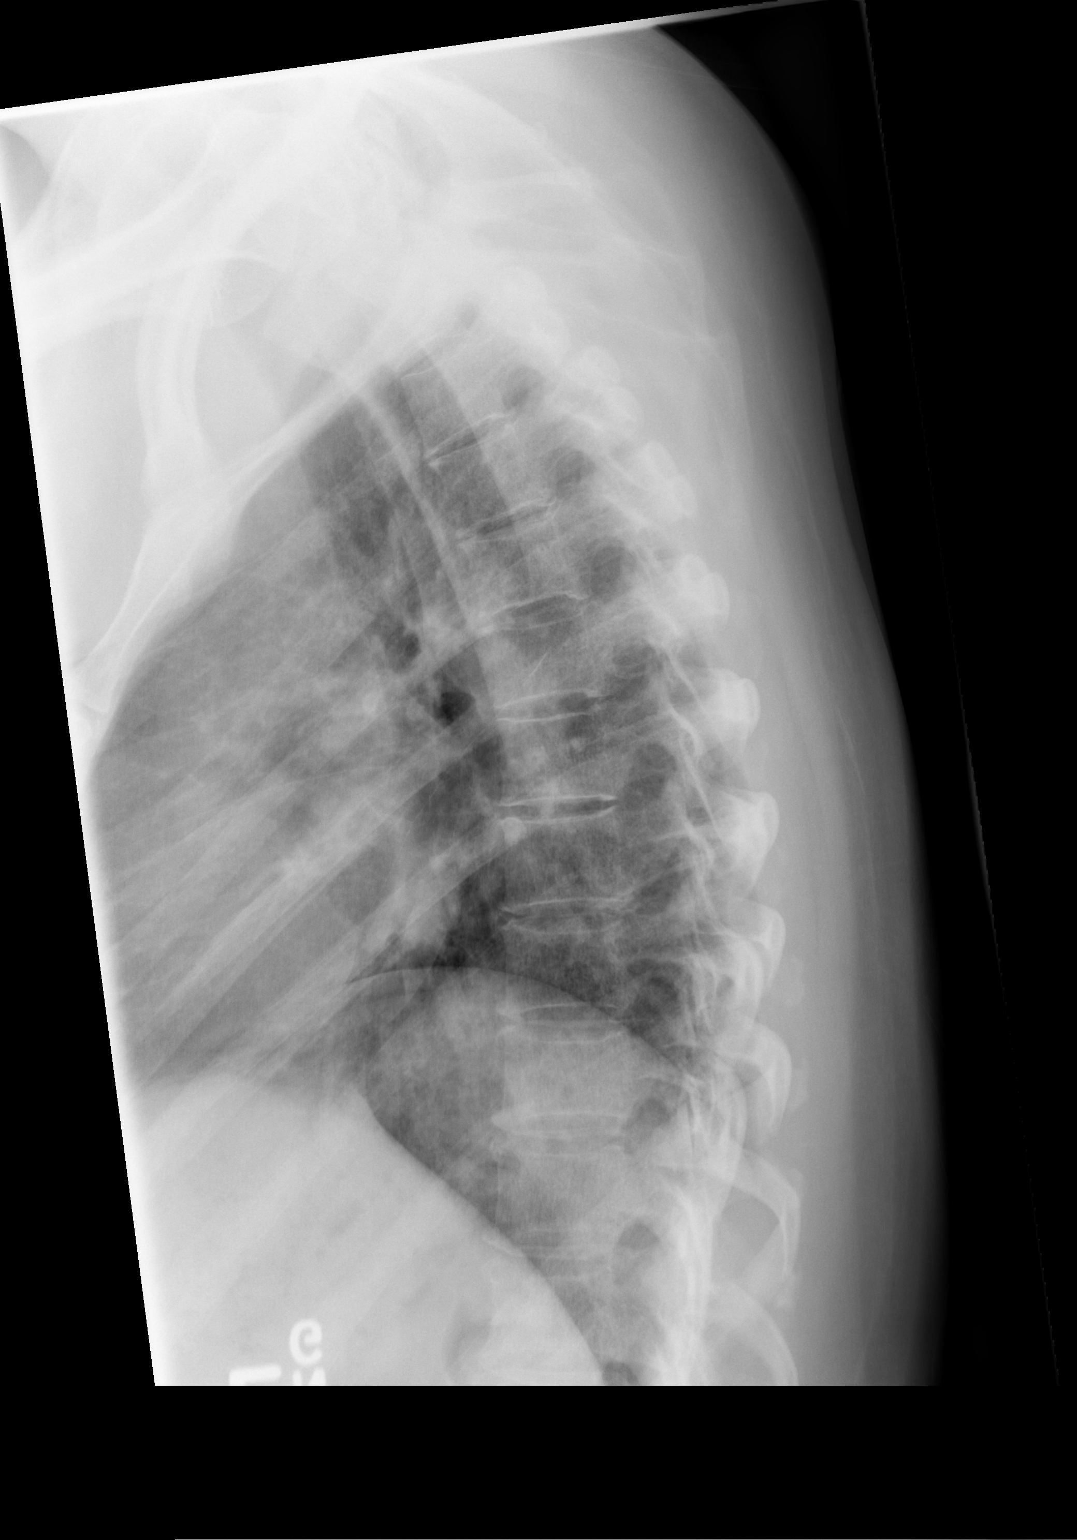

[2 of 2 positions shown; findings below may reference images not displayed]

FINDINGS: Minimal rightward scoliosis in the mid thoracic spine. No fracture
or subluxation. Disc spaces are maintained.
IMPRESSION: No acute bony abnormality.

## 2015-12-01 HISTORY — PX: ABDOMINOPLASTY: SUR9

## 2016-04-15 ENCOUNTER — Encounter: Payer: Self-pay | Admitting: Family Medicine

## 2016-04-15 ENCOUNTER — Ambulatory Visit (INDEPENDENT_AMBULATORY_CARE_PROVIDER_SITE_OTHER): Payer: BC Managed Care – PPO | Admitting: Family Medicine

## 2016-04-15 ENCOUNTER — Telehealth: Payer: Self-pay

## 2016-04-15 VITALS — BP 138/80 | HR 84 | Ht 61.0 in | Wt 164.2 lb

## 2016-04-15 DIAGNOSIS — L550 Sunburn of first degree: Secondary | ICD-10-CM | POA: Diagnosis not present

## 2016-04-15 DIAGNOSIS — L299 Pruritus, unspecified: Secondary | ICD-10-CM

## 2016-04-15 MED ORDER — HYDROXYZINE HCL 25 MG PO TABS: 25.0000 mg | ORAL_TABLET | Freq: Four times a day (QID) | ORAL | Status: DC | PRN

## 2016-04-15 NOTE — Patient Instructions (Signed)
  Drink plenty of fluids. Use ibuprofen up to 800mg  (4 OTC tablets) three times daily--always take with food, and cut back on the dose if it bothers your stomach. You may also use extra strength tylenol/acetaminophen product if needed for pain. STOP the benadryl and take the hydroxyzine instead.  Use this as needed for itching. Try cool compresses, continue aloe as needed.  Try these measures for the next 24-48 hours and hopefully symptoms will then subside.

## 2016-04-15 NOTE — Telephone Encounter (Signed)
error 

## 2016-04-15 NOTE — Progress Notes (Signed)
Chief Complaint  Patient presents with  . Sunburn    this past Sunday was in [facility] and got sunburn on her entire back, was really painful and now itching so badly.    Three days ago, she was outside for about an hour, or less, on [facility] (on the beach, in bathing suit) without any sunscreen, around noontime.  Upon leaving the beach that afternoon she noticed that she had a sunburn. She woke up in the middle of the night last night with intense itching, couldn't get back to sleep.  She used aloe lotion, peppermint spray, took a benadryl.  3 hours later she took a hot shower and another benadryl, which helped significantly.  She couldn't sleep for those 3 hours, due to discomfort. She took another dose of benadryl about noon, and itching is still present, but tolerable.   Denies any blistering, oozing.  Not yet peeling, just itchy.  Pain from sunburn has been subsiding, now the discomfort is related to the itching.  Not seen in the office for about 2 years. PCP is listed as Dr. Tye Savoy, sees him for her other medical problems as stated below.  Sexually active, not currently using any protection. Has appt with GYN tomorrow to discuss contraceptive options and routine GYN exam.  Past Medical History  Diagnosis Date  . Diabetes mellitus arising in pregnancy     now pre-diabetes  . Migraine   . IBS (irritable bowel syndrome)   . Fatigue   . Night sweats   . Hyperlipidemia   . Asthma   . SOB (shortness of breath)   . Cough   . Generalized headaches     migraines on occasion.  . Incontinence   . Sinus problem     runny nose  . Family history of breast cancer     MGM  . Hypothyroidism     s/p thyroidectomy for Grave's disease (Dr. Tye Savoy at [place])   Past Surgical History  Procedure Laterality Date  . Cesarean section    . Lasik    . Total thyroidectomy  07/30/11    Dr. Harlow Asa (Grave's disease)  . Dilatation & curettage/hysteroscopy with trueclear N/A  01/16/2014    Procedure: DILATATION & CURETTAGE/HYSTEROSCOPY WITH TRUCLEAR;  Surgeon: Marylynn Pearson, MD;  Location: [facility];  Service: Gynecology;  Laterality: N/A;  . Vulva /perineum biopsy N/A 01/16/2014    Procedure: VULVAR BIOPSY;  Surgeon: Marylynn Pearson, MD;  Location: [facility];  Service: Gynecology;  Laterality: N/A;  . Abdominoplasty  12/2015    Dr. Towanda Malkin   Social History   Social History  . Marital Status: Married    Spouse Name: N/A  . Number of Children: 1  . Years of Education: N/A   Occupational History  . personalized learning environment facilitator at [employer]  . Smoking status: Never Smoker   . Smokeless tobacco: Never Used  . Alcohol Use: Yes     Comment: once per week  . Drug Use: No  . Sexual Activity:    Partners: Male    Birth Control/ Protection: None   Other Topics Concern  . Not on file   Social History Narrative   Divorced and re-married.  Lives with her husband.  Child is grown and lives in own apartment   Outpatient Encounter Prescriptions as of 04/15/2016  Medication Sig Note  . calcium-vitamin D (OSCAL WITH D) 500-200 MG-UNIT per tablet Take 1 tablet by mouth daily  with breakfast.   . cholecalciferol (VITAMIN D) 1000 UNITS tablet Take 5,000 Units by mouth daily.   Marland Kitchen Fexofenadine HCl (ALLEGRA PO) Take by mouth.   . metFORMIN (GLUCOPHAGE) 500 MG tablet Take 1,000 mg by mouth daily with breakfast.  04/15/2016: Takes extended release 500mg , 2 tablets every morning  . NATURE-THROID 81.25 MG TABS Take 81.15 mg by mouth 2 (two) times daily.  04/15/2016: Received from: External Pharmacy  . PROAIR HFA 108 (90 Base) MCG/ACT inhaler Inhale 2 puffs into the lungs every 4 (four) hours as needed.  04/15/2016: Received from: External Pharmacy  . progesterone (PROMETRIUM) 100 MG capsule Take 100 mg by mouth daily.  04/15/2016: From Dr. Tye Savoy  . [DISCONTINUED] diphenhydrAMINE (BENADRYL) 25 MG tablet Take 25-50 mg by mouth every 6 (six)  hours as needed for itching. 04/15/2016: Took 3 doses since last night  . acetaminophen (TYLENOL) 500 MG tablet Take 1,000 mg by mouth every 6 (six) hours as needed for headache. Reported on 04/15/2016 05/02/2014: Prn   . Fluocinonide 0.1 % CREA Reported on 04/15/2016 04/15/2016: Received from: External Pharmacy  . hydrOXYzine (ATARAX/VISTARIL) 25 MG tablet Take 1-2 tablets (25-50 mg total) by mouth every 6 (six) hours as needed for itching.   . [DISCONTINUED] albuterol (PROVENTIL HFA;VENTOLIN HFA) 108 (90 BASE) MCG/ACT inhaler Inhale 2 puffs into the lungs every 6 (six) hours as needed for wheezing. (Patient not taking: Reported on 04/15/2016)   . [DISCONTINUED] Beclomethasone Dipropionate 80 MCG/ACT AERS Place 1 spray into the nose 2 (two) times daily.   . [DISCONTINUED] meloxicam (MOBIC) 7.5 MG tablet Take 1 tablet (7.5 mg total) by mouth daily.   . [DISCONTINUED] metFORMIN (GLUCOPHAGE-XR) 500 MG 24 hr tablet  04/15/2016: Received from: External Pharmacy  . [DISCONTINUED] predniSONE (DELTASONE) 10 MG tablet  04/15/2016: Received from: External Pharmacy  . [DISCONTINUED] Thyroid (NATURE-THROID PO) Take by mouth daily. Pt taking 1.25 grains bid    No facility-administered encounter medications on file as of 04/15/2016.   (atarax rx'd today, not taking prior to visit--using benadryl instead)  Allergies  Allergen Reactions  . Shellfish Allergy Nausea And Vomiting    ROS: no fever, chills, headaches, dizziness, URI symptoms, chest pain, palpitations, bleeding, bruising, rash or other complaints.   PHYSICAL EXAM: BP 138/80 mmHg  Pulse 84  Ht 5\' 1"  (1.549 m)  Wt 164 lb 3.2 oz (74.481 kg)  BMI 31.04 kg/m2  LMP 03/26/2016  Well appearing, pleasant female in no distress Skin: Erythema in distribution of areas outside of a bathing suit on her back--darkest red in the middle portion of her back, but also red on the upper back, posterior calves. No blistering or peeling. anteriorly her skin was not  burned. No rashes noted, just the erythema from the burn Neuro: alert and oriented, cranial nerves intact, normal strength, sensation, gait Psych: normal mood, affect, hygiene and grooming  ASSESSMENT/PLAN:  Sunburn of first degree  Pruritic condition - Plan: hydrOXYzine (ATARAX/VISTARIL) 25 MG tablet  Risks/side effects of meds reviewed, and proper sunscreen. If symptoms not improving on hydroxyzine and other supportive measures, next step would be steroids. She just recently finished a course for allergies (2 weeks ago), prefer to avoid.  Encouraged her to discuss contraception with GYN tomorrow, to avoid unwanted pregnancy (and if desires pregnancy, to start PNV).     Drink plenty of fluids. Use ibuprofen up to 800mg  (4 OTC tablets) three times daily--always take with food, and cut back on the dose if it bothers your stomach. You  may also use extra strength tylenol/acetaminophen product if needed for pain. STOP the benadryl and take the hydroxyzine instead.  Use this as needed for itching. Try cool compresses, continue aloe as needed.  Try these measures for the next 24-48 hours and hopefully symptoms will then subside.

## 2016-04-21 ENCOUNTER — Other Ambulatory Visit: Payer: Self-pay | Admitting: Obstetrics and Gynecology

## 2016-04-21 DIAGNOSIS — R928 Other abnormal and inconclusive findings on diagnostic imaging of breast: Secondary | ICD-10-CM

## 2016-04-29 ENCOUNTER — Ambulatory Visit
Admission: RE | Admit: 2016-04-29 | Discharge: 2016-04-29 | Disposition: A | Discharge status: Home / Self Care | Payer: BC Managed Care – PPO | Source: Ambulatory Visit | Attending: Obstetrics and Gynecology | Admitting: Obstetrics and Gynecology

## 2016-04-29 DIAGNOSIS — R928 Other abnormal and inconclusive findings on diagnostic imaging of breast: Secondary | ICD-10-CM

## 2016-04-29 IMAGING — MG MM DIAG BREAST TOMO UNI RIGHT
6 series · 6 of 14 positions shown · non-contrast
Comparison: Previous exam(s).

CLINICAL DATA: Screening recall for a right breast asymmetry.

EXAM:
2D DIGITAL DIAGNOSTIC UNILATERAL RIGHT MAMMOGRAM WITH CAD AND
ADJUNCT TOMO
RIGHT BREAST ULTRASOUND

[R MLO synth-2D]
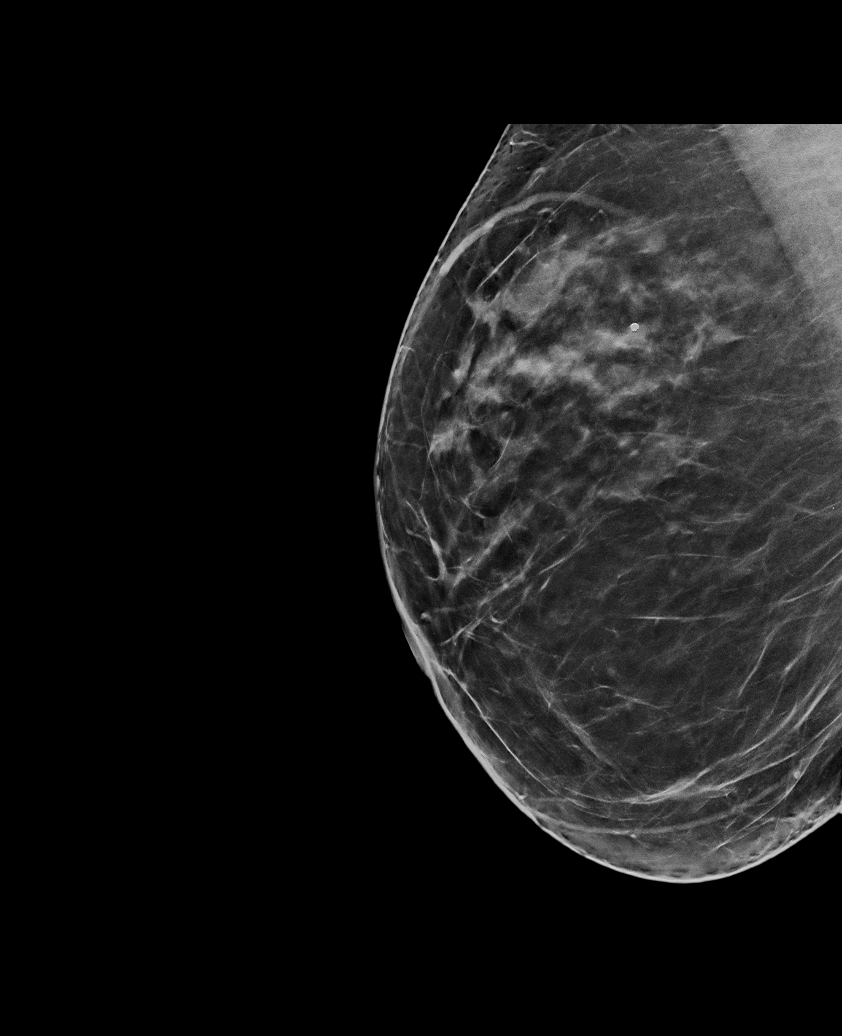

[R XCCL]
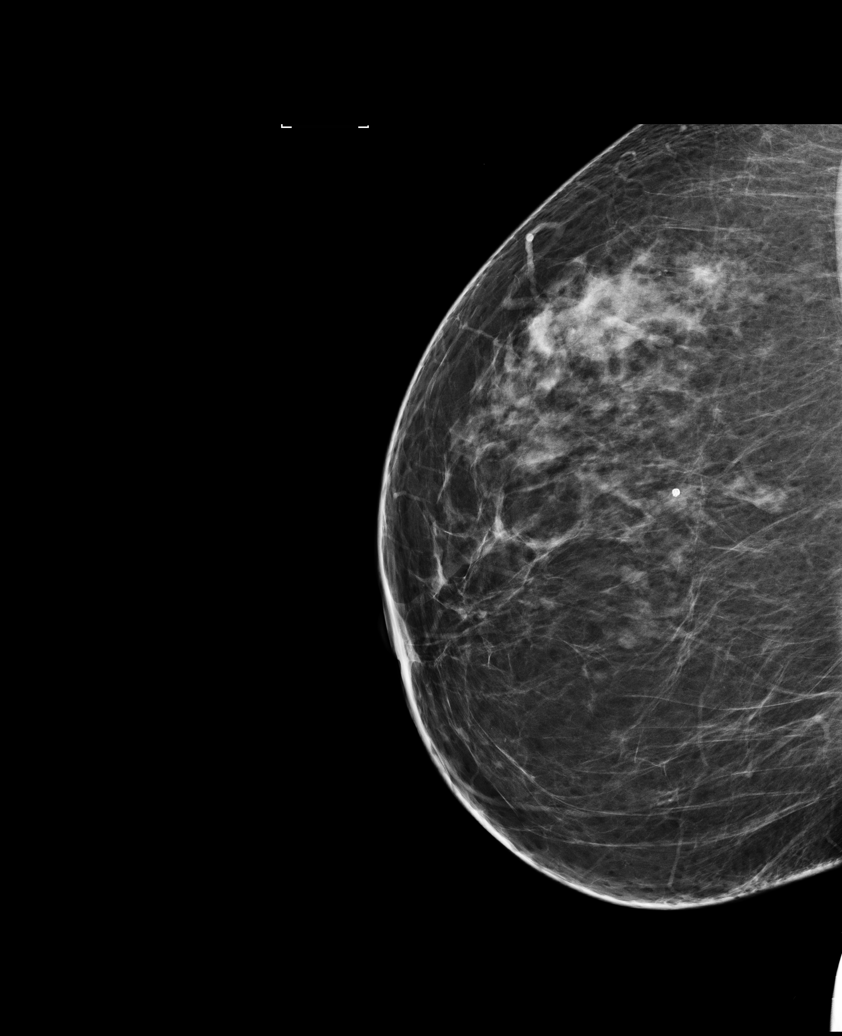

[R MLO]
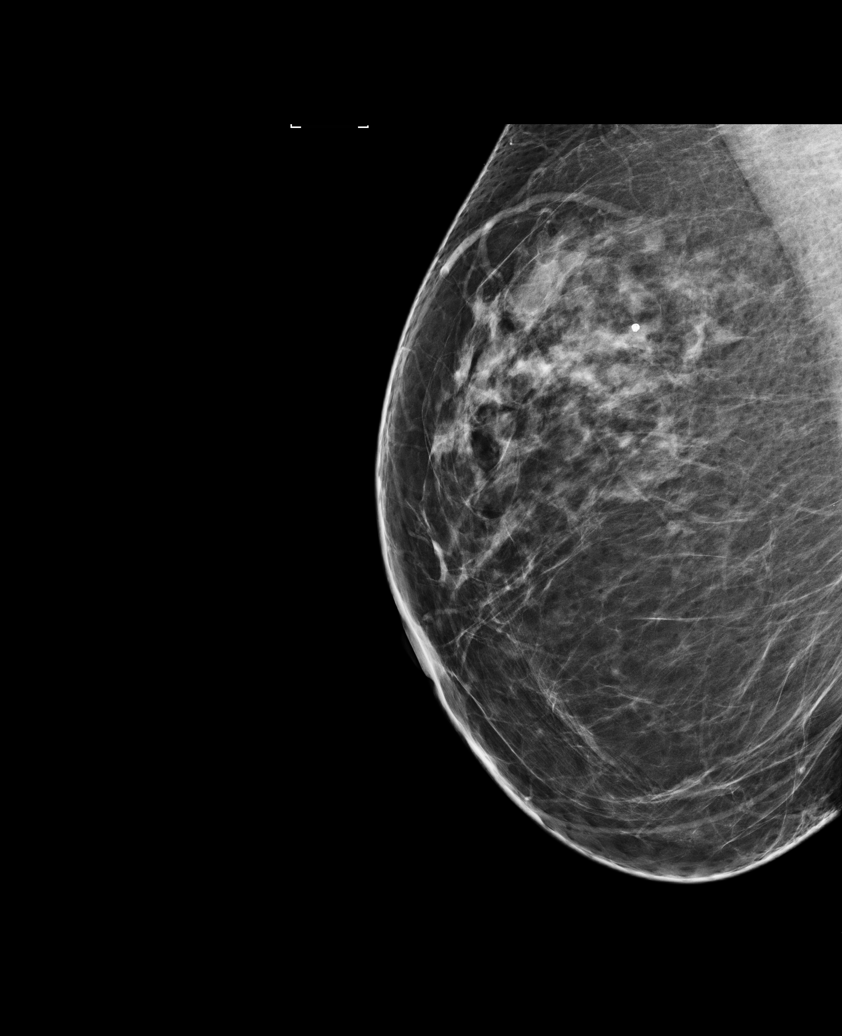

[R XCCL synth-2D]
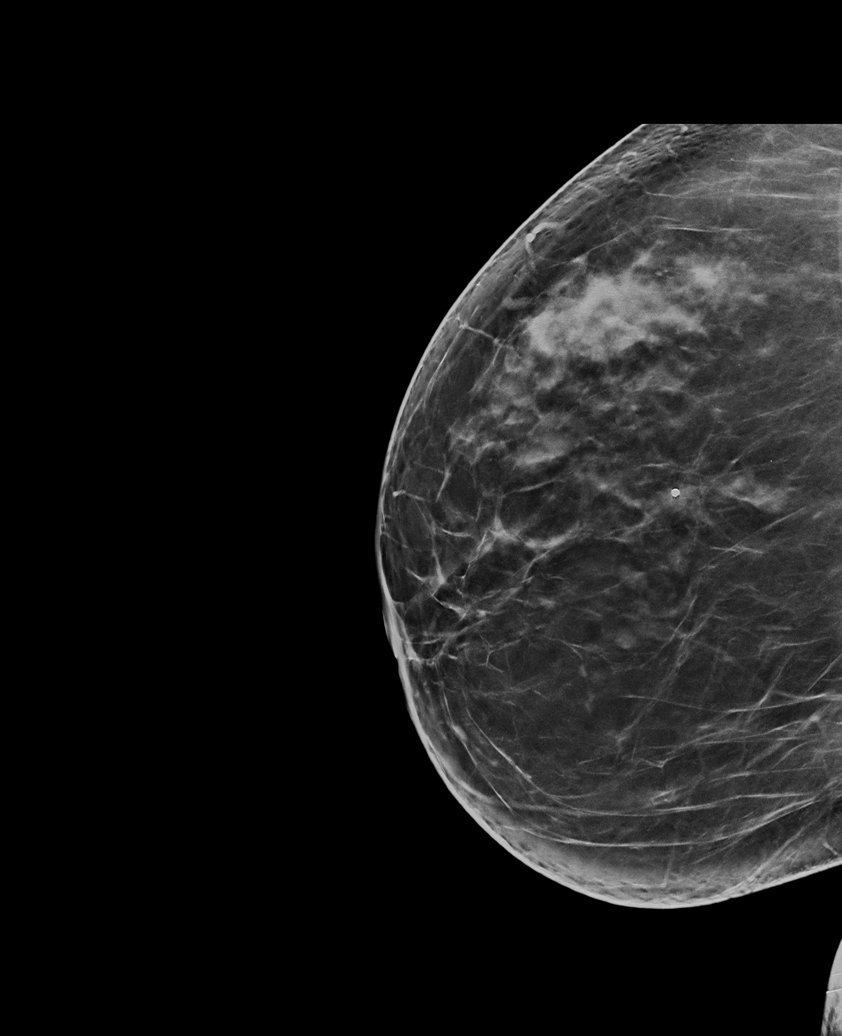

[R XCCL tomo · tomo slice 39/78.0]
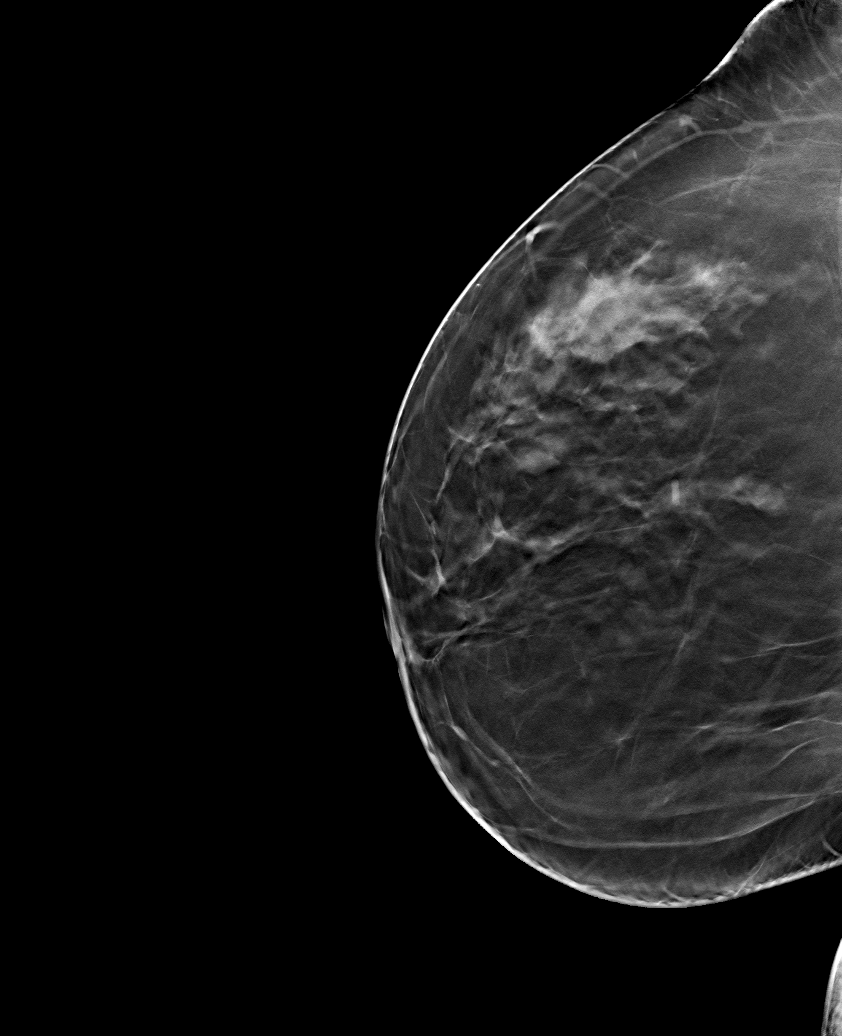

[R MLO tomo · tomo slice 37/74.0]
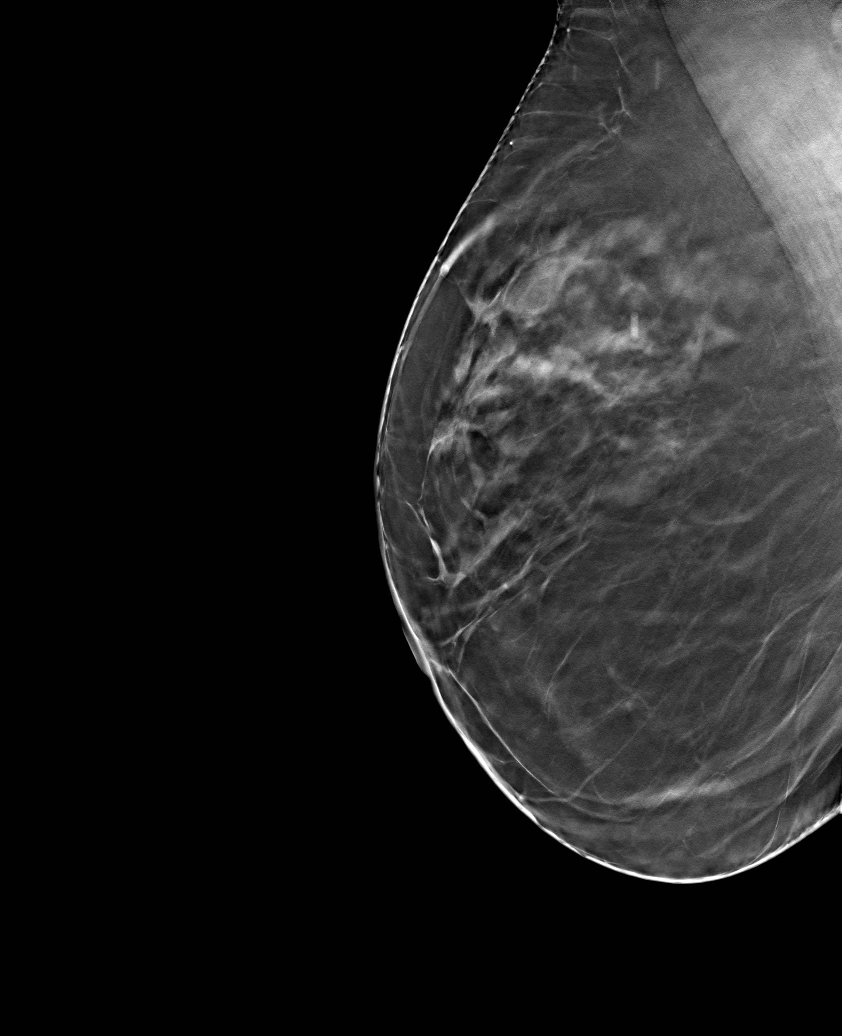

[6 of 14 positions shown; findings below may reference images not displayed]

ACR Breast Density Category c: The breast tissue is heterogeneously
dense, which may obscure small masses.
FINDINGS: There is a persistent focal asymmetry in the upper-outer right
breast, however on tomosynthesis imaging this appears similar to
multiple prior mammograms. Ultrasound will be performed to ensure
there is no small underlying mass.

Mammographic images were processed with CAD.

Physical exam of the upper-outer quadrant of the right breast
demonstrates normal fibroglandular tissue. No masses are palpated.

Ultrasound of the upper-outer quadrant which was scanned from the 9
o'clock to the 12 o'clock position demonstrates normal
fibroglandular tissue. No masses or suspicious areas of shadowing
are identified.
IMPRESSION: Though there is a persistent focal asymmetry in the upper-outer
quadrant of the right breast, this has been stable for several
years, and ultrasound of the upper-outer quadrant of the right
breast is normal. There is no mammographic or targeted sonographic
evidence of malignancy.

RECOMMENDATION:
Screening mammogram in one year.(Code:[VN])

I have discussed the findings and recommendations with the patient.
Results were also provided in writing at the conclusion of the
visit. If applicable, a reminder letter will be sent to the patient
regarding the next appointment.

BI-RADS CATEGORY  1: Negative.

## 2016-06-17 ENCOUNTER — Encounter: Payer: Self-pay | Admitting: Family Medicine

## 2016-06-17 ENCOUNTER — Ambulatory Visit (INDEPENDENT_AMBULATORY_CARE_PROVIDER_SITE_OTHER): Payer: BC Managed Care – PPO | Admitting: Family Medicine

## 2016-06-17 VITALS — BP 130/80 | HR 60 | Temp 98.7°F | Resp 16 | Ht 61.5 in | Wt 163.2 lb

## 2016-06-17 DIAGNOSIS — J069 Acute upper respiratory infection, unspecified: Secondary | ICD-10-CM

## 2016-06-17 MED ORDER — DOXYCYCLINE HYCLATE 100 MG PO TABS: 100.0000 mg | ORAL_TABLET | Freq: Two times a day (BID) | ORAL | Status: DC

## 2016-06-17 NOTE — Progress Notes (Signed)
Chief Complaint  Patient presents with  . URI    onset 4-5 days ago. coughing up bloody phelgm.    Husband was sick with a cold last week.  4-5 days ago she started with sore throat, stuffy nose, sneezing, sinus pressure.  Throat pain has improved, but she is now coughing.  She has persistent nasal stuffiness, not significant sinus pain. Not getting much out when blowing her nose.  Last night she noticed bright red blood in the phlegm she expectorated.  Cough is sometimes dry, sometimes productive.  She is mostly seeing just streaks of blood in the mucus, but last night seemed to have more blood one time.  Hasn't really coughed much up today. Denies other bleeding, bruising. She feels a little short of breath--mostly feeling tired in general. Chest feels heavy when she is laying down, making it seem harder to catch her breath.  Not short of breath with exertion.  She used her albuterol last night and again this morning--didn't really seem to improve her breathing.    She has been taking something similar to Nyquil (was in [place] until yesterday). She recalls that something had guaifenesin in it. She continues to take her daily allegra. She also used a cough lozenge for sore throat. Flew back from [facility] yesterday.  (husband got sick on the flight there).  No period since May--she did a home pregnancy test last week and it was negative. H/o some irregular cycles recently. Takes prometrium daily.  PMH, PSH, SH reviewed  Outpatient Encounter Prescriptions as of 06/17/2016  Medication Sig Note  . calcium-vitamin D (OSCAL WITH D) 500-200 MG-UNIT per tablet Take 1 tablet by mouth daily with breakfast.   . cholecalciferol (VITAMIN D) 1000 UNITS tablet Take 5,000 Units by mouth daily.   Marland Kitchen Fexofenadine HCl (ALLEGRA PO) Take by mouth.   . metFORMIN (GLUCOPHAGE) 500 MG tablet Take 1,000 mg by mouth daily with breakfast.  04/15/2016: Takes extended release 579m, 2 tablets every morning  . NATURE-THROID 81.25  MG TABS Take 81.15 mg by mouth 2 (two) times daily.  04/15/2016: Received from: External Pharmacy  . PROAIR HFA 108 (90 Base) MCG/ACT inhaler Inhale 2 puffs into the lungs every 4 (four) hours as needed.  06/17/2016: Used last night and this morning  . progesterone (PROMETRIUM) 100 MG capsule Take 100 mg by mouth daily.  04/15/2016: From Dr. STye Savoy . acetaminophen (TYLENOL) 500 MG tablet Take 1,000 mg by mouth every 6 (six) hours as needed for headache. Reported on 06/17/2016 05/02/2014: Prn   . Fluocinonide 0.1 % CREA Reported on 06/17/2016 04/15/2016: Received from: External Pharmacy  . hydrOXYzine (ATARAX/VISTARIL) 25 MG tablet Take 1-2 tablets (25-50 mg total) by mouth every 6 (six) hours as needed for itching. (Patient not taking: Reported on 06/17/2016)    No facility-administered encounter medications on file as of 06/17/2016.   Allergies  Allergen Reactions  . Shellfish Allergy Nausea And Vomiting   ROS: no fever, chills, headaches, dizziness, ear pain, chest pain, leg swelling, nausea, vomiting, diarrhea or other complaints.  See HPI.  PHYSICAL EXAM: BP 130/80 mmHg  Pulse 60  Temp(Src) 98.7 F (37.1 C) (Oral)  Resp 16  Ht 5' 1.5" (1.562 m)  Wt 163 lb 3.2 oz (74.027 kg)  BMI 30.34 kg/m2  LMP 04/14/2016  Well developed, somewhat tired appearing female in no distress. Occasional sniffling and throat clearing HEENT: PERRL, EOMi, conjunctiva and sclera are clear.  TM's and EAC's are partly obscured by thin cerumen, visualized  portions appear normal.  Nasal mucosa is moderately edematous, clear mucus.  No erythema. Sinuses are nontender. OP is clear Neck: no lymphadenopathy, thyromegaly or mass Heart: regular rate and rhythm wihtout murmur Lungs: clear bilaterally. No wheeze, rale, ronchi.  Good air movement Skin: no rashes, normal turgor, no bruising  ASSESSMENT/PLAN:   Acute upper respiratory infection - Plan: doxycycline (VIBRA-TABS) 100 MG tablet   Drink plenty of water. Take  Mucinex (plain or the D version--the Mucinex D contains sudafed and will help dry up the nose and sinus pressure more--you can either take the combination product with mucinex, or get the plain mucinex and a separate Sudafed). You may use Delsym syrup as a cough suppressant, if needed.  The dextromethorphan that is in this is often in the other combination products (like the PM medications, so be sure to look at all OTC medications to prevent duplication).   Use a nasal saline spray, and consider using sinus rinses (sinus rinse kit or Neti-pot).    If you develop fever, or your cough and symptoms worsen rather than start to improve over the next 24-48 hours, then start the doxycycline, and take twice daily for 10 days. Return in a week if not improving, sooner if clearly getting worse--shortness of breath, high fevers that persist, worsening blood in the mucus.

## 2016-06-17 NOTE — Patient Instructions (Signed)
  Drink plenty of water. Take Mucinex (plain or the D version--the Mucinex D contains sudafed and will help dry up the nose and sinus pressure more--you can either take the combination product with mucinex, or get the plain mucinex and a separate Sudafed). You may use Delsym syrup as a cough suppressant, if needed.  The dextromethorphan that is in this is often in the other combination products (like the PM medications, so be sure to look at all OTC medications to prevent duplication).   Use a nasal saline spray, and consider using sinus rinses (sinus rinse kit or Neti-pot).    If you develop fever, or your cough and symptoms worsen rather than start to improve over the next 24-48 hours, then start the doxycycline, and take twice daily for 10 days. Return in a week if not improving, sooner if clearly getting worse--shortness of breath, high fevers that persist, worsening blood in the mucus.

## 2016-11-04 ENCOUNTER — Encounter: Payer: Self-pay | Admitting: Family Medicine

## 2016-11-04 ENCOUNTER — Ambulatory Visit (INDEPENDENT_AMBULATORY_CARE_PROVIDER_SITE_OTHER): Payer: BC Managed Care – PPO | Admitting: Family Medicine

## 2016-11-04 VITALS — BP 128/82 | HR 80 | Temp 99.1°F | Ht 61.5 in | Wt 156.2 lb

## 2016-11-04 DIAGNOSIS — J3089 Other allergic rhinitis: Secondary | ICD-10-CM | POA: Diagnosis not present

## 2016-11-04 DIAGNOSIS — J0100 Acute maxillary sinusitis, unspecified: Secondary | ICD-10-CM

## 2016-11-04 MED ORDER — AMOXICILLIN-POT CLAVULANATE 875-125 MG PO TABS: 1.0000 | ORAL_TABLET | Freq: Two times a day (BID) | ORAL | 0 refills | Status: DC

## 2016-11-04 NOTE — Progress Notes (Signed)
Chief Complaint  Patient presents with  . Facial Pain    thinks she has a sinus infection for over a month and cannot take it anymore. Does not really have any discolored mucus, has had some low grade fevers over the last month.    Symptoms started over a month ago--congestion and sore, some pain and pressure in her sinuses. It was tolerable for a while, but has been getting worse.  Nasal drainage is yellowish. Denies sore throat, cough. Pain is at the left cheek, sometimes radiates into the ear, and to the back of her head.  She has been using Neti-pot, but not getting much out with this. She tried Mucinex without much benefit. Sudafed temporarily helps with the sinus pain.  She takes Allegra daily for allergies--doesn't completely control, still has some allergy symptoms even with daily Allegra. Previously had nosebleeds when taking nasal steroids.  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  PMH, PSH, SH reviewed and updated.  Outpatient Encounter Prescriptions as of 11/04/2016  Medication Sig Note  . calcium-vitamin D (OSCAL WITH D) 500-200 MG-UNIT per tablet Take 1 tablet by mouth daily with breakfast.   . cholecalciferol (VITAMIN D) 1000 UNITS tablet Take 5,000 Units by mouth daily.   Marland Kitchen Fexofenadine HCl (ALLEGRA PO) Take by mouth.   Marland Kitchen ibuprofen (ADVIL,MOTRIN) 200 MG tablet Take 600-800 mg by mouth every 6 (six) hours as needed.   . metFORMIN (GLUCOPHAGE) 500 MG tablet Take 1,000 mg by mouth daily with breakfast.  04/15/2016: Takes extended release 500mg , 2 tablets every morning  . progesterone (PROMETRIUM) 100 MG capsule Take 100 mg by mouth daily.  04/15/2016: From Dr. Tye Savoy  . thyroid (ARMOUR) 65 MG tablet Take 65 mg by mouth daily after lunch.   . thyroid (ARMOUR) 90 MG tablet Take 90 mg by mouth every morning.   Marland Kitchen acetaminophen (TYLENOL) 500 MG tablet Take 1,000 mg by mouth  every 6 (six) hours as needed for headache. Reported on 06/17/2016 05/02/2014: Prn   . PROAIR HFA 108 (90 Base) MCG/ACT inhaler Inhale 2 puffs into the lungs every 4 (four) hours as needed.    . [DISCONTINUED] doxycycline (VIBRA-TABS) 100 MG tablet Take 1 tablet (100 mg total) by mouth 2 (two) times daily.   . [DISCONTINUED] Fluocinonide 0.1 % CREA Reported on 06/17/2016 04/15/2016: Received from: External Pharmacy  . [DISCONTINUED] hydrOXYzine (ATARAX/VISTARIL) 25 MG tablet Take 1-2 tablets (25-50 mg total) by mouth every 6 (six) hours as needed for itching. (Patient not taking: Reported on 06/17/2016)   . [DISCONTINUED] NATURE-THROID 81.25 MG TABS Take 81.15 mg by mouth 2 (two) times daily.  04/15/2016: Received from: External Pharmacy   No facility-administered encounter medications on file as of 11/04/2016.    Allergies  Allergen Reactions  . Shellfish Allergy Nausea And Vomiting    ROS: some low grade fevers, no chills.  Denies nausea, vomiting, diarrhea, urinary symptoms, rashes, bleeding, bruising. Denies myalgias, arthralgias. Cycles have been irregular.  Periodically does pregnancy tests at home.  Doesn't think she is pregnant--denies breast tenderness, nausea Patient has asthma--hasn't had any flare or needed to use her albuterol recently.   PHYSICAL EXAM:  BP 128/82 (BP Location: Left Arm, Patient Position: Sitting, Cuff Size: Normal)   Pulse 80   Temp 99.1 F (37.3 C)   Ht 5' 1.5" (1.562 m)   Wt 156 lb 3.2 oz (70.9 kg)   LMP 09/14/2016 (Exact Date)  BMI 29.04 kg/m   Well appearing, pleasant female in no distress, occasional sniffle. HEENT: PERRL, EOMI, conjunctiva and sclera are clear. TM's--normal on the right, obstructed by cerumen on the left. EAC's normal. Nasal mucosa is mildly edematous, mild erythema, clear drainage on the left, yellow crusting on the right. Sinuses are nontender. OP is clear Neck: no lymphadenopathy or mass Heart: regular rate and rhythm without  murmur Lungs: clear bilaterally, no wheezes, rales, ronchi    ASSESSMENT/PLAN:  Acute maxillary sinusitis, recurrence not specified - Plan: amoxicillin-clavulanate (AUGMENTIN) 875-125 MG tablet  Non-seasonal allergic rhinitis, unspecified chronicity, unspecified trigger   Chronic allergies, with evidence of acute sinusitis on exam and history. Treat with augmentin.  Call for extension of course if not completely better after 10d.  Call for diflucan, if needed. Risks/side effects of ABX reviewed.   Discussed nasal steroid sprays in treating allergies, and ways to reduce the risk of bleeding, proper technique.    Drink plenty of fluids. Continue mucinex and/or sudafed as needed. Take the antibiotics as directed--twice daily for 10 days. Contact us if you have any side effects or problems, or if you aren't improving. It may take 5-7 days to see improvement. Contact us on day 10 (last day of pills) if you aren't 100% better, to extend the course of antibiotics for another week.

## 2016-11-04 NOTE — Patient Instructions (Signed)
  Drink plenty of fluids. Continue mucinex and/or sudafed as needed. Take the antibiotics as directed--twice daily for 10 days. Contact us if you have any side effects or problems, or if you aren't improving. It may take 5-7 days to see improvement. Contact us on day 10 (last day of pills) if you aren't 100% better, to extend the course of antibiotics for another week.

## 2016-11-12 ENCOUNTER — Other Ambulatory Visit: Payer: Self-pay | Admitting: Family Medicine

## 2016-11-12 DIAGNOSIS — J0100 Acute maxillary sinusitis, unspecified: Secondary | ICD-10-CM

## 2016-11-13 MED ORDER — AMOXICILLIN-POT CLAVULANATE 875-125 MG PO TABS: 1.0000 | ORAL_TABLET | Freq: Two times a day (BID) | ORAL | 0 refills | Status: DC

## 2016-11-13 NOTE — Telephone Encounter (Signed)
Spoke with patient and she states she is still having the sinus pressure and mucous is still discolored. I will send in another round of antibitoics

## 2016-11-13 NOTE — Telephone Encounter (Signed)
Pt is requesting refill of ABX.  She was told at visit to call on day 10 to extend course if not completely better, but this warrants a phone call to see what her persistent symptoms are.  I want to make sure that she has improved, see what isn't better yet. (If she is zero better or worse, it needs a change in antibiotic, not longer course of the same one).  If mucus is still discolored, ongoing sinus pressure or LG fever, then refill #14 (an extra week).

## 2017-02-01 ENCOUNTER — Ambulatory Visit (INDEPENDENT_AMBULATORY_CARE_PROVIDER_SITE_OTHER): Payer: BC Managed Care – PPO | Admitting: Podiatry

## 2017-02-01 ENCOUNTER — Encounter: Payer: Self-pay | Admitting: Podiatry

## 2017-02-01 DIAGNOSIS — M722 Plantar fascial fibromatosis: Secondary | ICD-10-CM

## 2017-02-01 DIAGNOSIS — B351 Tinea unguium: Secondary | ICD-10-CM

## 2017-02-01 DIAGNOSIS — M779 Enthesopathy, unspecified: Secondary | ICD-10-CM

## 2017-02-01 NOTE — Patient Instructions (Signed)

## 2017-02-10 NOTE — Progress Notes (Addendum)
Subjective: 46 year old female presents the office today for concerns for left second toenail becoming thick and discolored brown discoloration which is been ongoing for about a month which started after getting a pedicure. She denies any redness, drainage or any pain but she is concerned about the way it looks. She's had no recent treatment for this.    Patient also is asking for new inserts. She states that hers are worn out. She has a history of plantar fasciitis and capsulitis for which she had the inserts.   Denies any systemic complaints such as fevers, chills, nausea, vomiting. No acute changes since last appointment, and no other complaints at this time.   Objective: AAO x3, NAD DP/PT pulses palpable bilaterally, CRT less than 3 seconds Left second digit toenail is dystrophic, discolored, hypertrophic. There is yellow-brown discoloration of the toenail. There is no pain. No swelling redness, drainage or any signs of infection.  Overall her foot pain is controlled. No swelling and no pain currently.  No open lesions or pre-ulcerative lesions.  No pain with calf compression, swelling, warmth, erythema  Assessment: Onychodystrophy left second toenail possible onychomycosis; capsulitis/neuroma.   Plan: -All treatment options discussed with the patient including all alternatives, risks, complications.  -Etiology of symptoms were discussed -The nail was debrided today and this was sent to [facility] for evaluation of onychomycosis. Pending this we will discuss treatment options. -Orthtoics were ordered on 02/11/17 and sent to [facility] labs.  -monitor for signs or symptoms of infection. -Patient encouraged to call the office with any questions, concerns, change in symptoms.   Celesta Gentile, DPM

## 2017-02-12 ENCOUNTER — Telehealth: Payer: Self-pay | Admitting: *Deleted

## 2017-02-12 NOTE — Telephone Encounter (Signed)
Patient called back yesterday and stated that she wanted to get the inserts and I faxed over a order for [facility] today and stated to order the same

## 2017-02-16 ENCOUNTER — Telehealth: Payer: Self-pay | Admitting: *Deleted

## 2017-02-16 NOTE — Telephone Encounter (Addendum)
-----   Message from Trula Slade, DPM sent at 02/11/2017  9:53 PM EDT ----- Negative for fungus and likely from damage. Please let her know. Can try urea cream. Her orthotics were ordered. If she wishes we can discuss when she comes in to [facility]. 02/16/2017-Left message informing pt that Dr. Jacqualyn Posey had stated the culture results were negative and if she would like to discuss her options she could when she picked up her orthotics.

## 2017-03-01 ENCOUNTER — Ambulatory Visit (INDEPENDENT_AMBULATORY_CARE_PROVIDER_SITE_OTHER): Payer: BC Managed Care – PPO | Admitting: Podiatry

## 2017-03-01 ENCOUNTER — Encounter: Payer: Self-pay | Admitting: Podiatry

## 2017-03-01 DIAGNOSIS — M722 Plantar fascial fibromatosis: Secondary | ICD-10-CM | POA: Diagnosis not present

## 2017-03-01 DIAGNOSIS — L603 Nail dystrophy: Secondary | ICD-10-CM | POA: Diagnosis not present

## 2017-03-01 MED ORDER — TERBINAFINE HCL 250 MG PO TABS: 250.0000 mg | ORAL_TABLET | Freq: Every day | ORAL | 0 refills | Status: DC

## 2017-03-01 NOTE — Patient Instructions (Signed)

## 2017-03-03 ENCOUNTER — Telehealth: Payer: Self-pay | Admitting: *Deleted

## 2017-03-03 MED ORDER — NONFORMULARY OR COMPOUNDED ITEM: 2 refills | Status: DC

## 2017-03-03 NOTE — Progress Notes (Signed)
Subjective: 46 year old female presents the office a pickup orthotics and also discussed nail culture results. She states that her left second toenails about the same. Denies any pain denies any redness or drainage or any swelling. Denies any systemic complaints such as fevers, chills, nausea, vomiting. No acute changes since last appointment, and no other complaints at this time.   Objective: AAO x3, NAD DP/PT pulses palpable bilaterally, CRT less than 3 seconds Left second toenail continues to be hypertrophic, dystrophic, discolored. They're yellow to brown discoloration. There is no pain with tendinosis or any redness or drainage. No open lesions or pre-ulcerative lesions.  No pain with calf compression, swelling, warmth, erythema  Assessment: Left second digit onychodystrophy, pickup orthotics  Plan: -All treatment options discussed with the patient including all alternatives, risks, complications.  Orthotics were dispensed today. Oral and written break-in instructions were discussed. -I discussed Bako results. It does not reveal onychomycosis but clinically appears to be this. We will start a urea cream to help thin the toenail as well as a compound cream for onychomycosis to include Lamisil. This was ordered through [facility].  -RTC as scheduled or sooner if needed -Patient encouraged to call the office with any questions, concerns, change in symptoms.   Celesta Gentile, DPM

## 2017-03-03 NOTE — Telephone Encounter (Signed)
Dr. Jacqualyn Posey ordered [place] Onychomycosis nail lacquer + maximum amount of allowable Urea cream in formulation. Faxed to [facility].

## 2017-03-08 ENCOUNTER — Ambulatory Visit: Payer: BC Managed Care – PPO | Admitting: Family Medicine

## 2017-03-11 ENCOUNTER — Telehealth: Payer: Self-pay | Admitting: *Deleted

## 2017-03-11 NOTE — Telephone Encounter (Signed)
Please send no show letter.  Doesn't need to r/s

## 2017-03-11 NOTE — Telephone Encounter (Signed)

## 2017-03-12 ENCOUNTER — Encounter: Payer: Self-pay | Admitting: Family Medicine

## 2017-03-12 NOTE — Telephone Encounter (Signed)
No show letter sent.

## 2017-03-29 ENCOUNTER — Ambulatory Visit (INDEPENDENT_AMBULATORY_CARE_PROVIDER_SITE_OTHER): Payer: BC Managed Care – PPO | Admitting: Podiatry

## 2017-03-29 ENCOUNTER — Encounter: Payer: Self-pay | Admitting: Podiatry

## 2017-03-29 DIAGNOSIS — B351 Tinea unguium: Secondary | ICD-10-CM

## 2017-03-29 DIAGNOSIS — M722 Plantar fascial fibromatosis: Secondary | ICD-10-CM

## 2017-03-29 DIAGNOSIS — L603 Nail dystrophy: Secondary | ICD-10-CM | POA: Diagnosis not present

## 2017-03-31 NOTE — Progress Notes (Signed)
Subjective: Patient presents today for follow-up evaluation of left second toenail as well as orthotics. She states the orthotics are fitting well without any complaints. The toenail is doing better and she has been using the urea and topical anti-fungal. Denies any systemic complaints such as fevers, chills, nausea, vomiting. No acute changes since last appointment, and no other complaints at this time.   Objective: AAO x3, NAD DP/PT pulses palpable bilaterally, CRT less than 3 seconds No pain to her feet today. She does not have the orthotics with her but she has no complaints with these. The left 2nd digit toenail is improving and appears to have a more normal color today and is thinner. No pain, swelling, redness or other signs of infection.  No open lesions or pre-ulcerative lesions.  No pain with calf compression, swelling, warmth, erythema  Assessment: Left 2nd digit onydystrophy which is improving and orthotics are fitting well.   Plan: -All treatment options discussed with the patient including all alternatives, risks, complications.  -Continue urea cream and topical anti-fungal. Application instructions and duration of each discussed -Continue orthotics and supportive shoes.  -RTC as needed.  -Patient encouraged to call the office with any questions, concerns, change in symptoms.   Celesta Gentile, DPM

## 2017-05-03 ENCOUNTER — Ambulatory Visit (INDEPENDENT_AMBULATORY_CARE_PROVIDER_SITE_OTHER): Payer: BC Managed Care – PPO | Admitting: Allergy & Immunology

## 2017-05-03 ENCOUNTER — Encounter: Payer: Self-pay | Admitting: Allergy & Immunology

## 2017-05-03 VITALS — BP 132/80 | HR 72 | Temp 98.6°F | Resp 16 | Ht 61.75 in | Wt 154.2 lb

## 2017-05-03 DIAGNOSIS — J4530 Mild persistent asthma, uncomplicated: Secondary | ICD-10-CM | POA: Diagnosis not present

## 2017-05-03 DIAGNOSIS — J301 Allergic rhinitis due to pollen: Secondary | ICD-10-CM | POA: Diagnosis not present

## 2017-05-03 DIAGNOSIS — T781XXD Other adverse food reactions, not elsewhere classified, subsequent encounter: Secondary | ICD-10-CM

## 2017-05-03 MED ORDER — MONTELUKAST SODIUM 10 MG PO TABS: 10.0000 mg | ORAL_TABLET | Freq: Every day | ORAL | 5 refills | Status: DC

## 2017-05-03 NOTE — Patient Instructions (Addendum)
1. Mild persistent asthma, uncomplicated - Lung function looked good today, but you chronic cough could be a sign of uncontrolled asthma - I would recommend starting an inhaled steroid to see if this helps your cough. - Call us back if you like the medication and we can send in a prescription.  - Daily controller medication(s): Qvar 22mcg two puffs twice daily (sample provided) - Rescue medications: albuterol 4 puffs every 4-6 hours as needed - Changes during respiratory infections or worsening symptoms: increase Qvar 18mcg to 4 puffs twice daily for TWO WEEKS. - Asthma control goals:  * Full participation in all desired activities (may need albuterol before activity) * Albuterol use two time or less a week on average (not counting use with activity) * Cough interfering with sleep two time or less a month * Oral steroids no more than once a year * No hospitalizations  2. Chronic rhinitis - Testing today showed: trees, weeds, grasses, molds, dust mites, cat, dog and cockroach - Avoidance measures provided. - Continue with Allegra (fexofenadine) 180mg  table once daily. You can add an extra dose as needed for breakthrough symptoms.  - Start Dymista (fluticasone/azelastine) two sprays per nostril 1-2 times daily as needed and Pazeo (olopatadine) one drop per eye daily as needed - Consider allergy shots as a means of long-term control. - Allergy shots "re-train" the immune system to ignore environmental allergens and decrease the resulting immune response to those allergens.  - Call us after you talk to your insurance company.  - Then you can make an appointment   3. Return in about 2 months (around 07/03/2017).  Please inform us of any Emergency Department visits, hospitalizations, or changes in symptoms. Call us before going to the ED for breathing or allergy symptoms since we might be able to fit you in for a sick visit. Feel free to contact us anytime with any questions, problems, or  concerns.  It was a pleasure to meet you today! Happy spring!   Websites that have reliable patient information: 1. American Academy of Asthma, Allergy, and Immunology: www.aaaai.org 2. Food Allergy Research and Education (FARE): foodallergy.org 3. Mothers of Asthmatics: http://www.asthmacommunitynetwork.org 4. American College of Allergy, Asthma, and Immunology: www.acaai.org  Reducing Pollen Exposure  The American Academy of Allergy, Asthma and Immunology suggests the following steps to reduce your exposure to pollen during allergy seasons.    1. Do not hang sheets or clothing out to dry; pollen may collect on these items. 2. Do not mow lawns or spend time around freshly cut grass; mowing stirs up pollen. 3. Keep windows closed at night.  Keep car windows closed while driving. 4. Minimize morning activities outdoors, a time when pollen counts are usually at their highest. 5. Stay indoors as much as possible when pollen counts or humidity is high and on windy days when pollen tends to remain in the air longer. 6. Use air conditioning when possible.  Many air conditioners have filters that trap the pollen spores. 7. Use a HEPA room air filter to remove pollen form the indoor air you breathe.  Control of Mold Allergen  Mold and fungi can grow on a variety of surfaces provided certain temperature and moisture conditions exist.  Outdoor molds grow on plants, decaying vegetation and soil.  The major outdoor mold, Alternaria and Cladosporium, are found in very high numbers during hot and dry conditions.  Generally, a late Summer - Fall peak is seen for common outdoor fungal spores.  Rain will temporarily lower  outdoor mold spore count, but counts rise rapidly when the rainy period ends.  The most important indoor molds are Aspergillus and Penicillium.  Dark, humid and poorly ventilated basements are ideal sites for mold growth.  The next most common sites of mold growth are the bathroom and the  kitchen.  Outdoor [employer] 1. Use air conditioning and keep windows closed 2. Avoid exposure to decaying vegetation. 3. Avoid leaf raking. 4. Avoid grain handling. 5. Consider wearing a face mask if working in moldy areas.  Indoor Mold Control 1. Maintain humidity below 50%. 2. Clean washable surfaces with 5% bleach solution. 3. Remove sources e.g. contaminated carpets.  Control of House Dust Mite Allergen    House dust mites play a major role in allergic asthma and rhinitis.  They occur in environments with high humidity wherever human skin, the food for dust mites is found. High levels have been detected in dust obtained from mattresses, pillows, carpets, upholstered furniture, bed covers, clothes and soft toys.  The principal allergen of the house dust mite is found in its feces.  A gram of dust may contain 1,000 mites and 250,000 fecal particles.  Mite antigen is easily measured in the air during house cleaning activities.    1. Encase mattresses, including the box spring, and pillow, in an air tight cover.  Seal the zipper end of the encased mattresses with wide adhesive tape. 2. Wash the bedding in water of 130 degrees Farenheit weekly.  Avoid cotton comforters/quilts and flannel bedding: the most ideal bed covering is the dacron comforter. 3. Remove all upholstered furniture from the bedroom. 4. Remove carpets, carpet padding, rugs, and non-washable window drapes from the bedroom.  Wash drapes weekly or use plastic window coverings. 5. Remove all non-washable stuffed toys from the bedroom.  Wash stuffed toys weekly. 6. Have the room cleaned frequently with a vacuum cleaner and a damp dust-mop.  The patient should not be in a room which is being cleaned and should wait 1 hour after cleaning before going into the room. 7. Close and seal all heating outlets in the bedroom.  Otherwise, the room will become filled with dust-laden air.  An electric heater can be used to heat the  room. 8. Reduce indoor humidity to less than 50%.  Do not use a humidifier.  Control of Cockroach Allergen  Cockroach allergen has been identified as an important cause of acute attacks of asthma, especially in urban settings.  There are fifty-five species of cockroach that exist in the [place], however only three, the [place], [place] species produce allergen that can affect patients with Asthma.  Allergens can be obtained from fecal particles, egg casings and secretions from cockroaches.    1. Remove food sources. 2. Reduce access to water. 3. Seal access and entry points. 4. Spray runways with 0.5-1% Diazinon or Chlorpyrifos 5. Blow boric acid power under stoves and refrigerator. 6. Place bait stations (hydramethylnon) at feeding sites.  Control of House Dust Mite Allergen    House dust mites play a major role in allergic asthma and rhinitis.  They occur in environments with high humidity wherever human skin, the food for dust mites is found. High levels have been detected in dust obtained from mattresses, pillows, carpets, upholstered furniture, bed covers, clothes and soft toys.  The principal allergen of the house dust mite is found in its feces.  A gram of dust may contain 1,000 mites and 250,000 fecal particles.  Mite antigen is  easily measured in the air during house cleaning activities.    9. Encase mattresses, including the box spring, and pillow, in an air tight cover.  Seal the zipper end of the encased mattresses with wide adhesive tape. 10. Wash the bedding in water of 130 degrees Farenheit weekly.  Avoid cotton comforters/quilts and flannel bedding: the most ideal bed covering is the dacron comforter. 11. Remove all upholstered furniture from the bedroom. 12. Remove carpets, carpet padding, rugs, and non-washable window drapes from the bedroom.  Wash drapes weekly or use plastic window coverings. 13. Remove all non-washable stuffed toys from the bedroom.   Wash stuffed toys weekly. 14. Have the room cleaned frequently with a vacuum cleaner and a damp dust-mop.  The patient should not be in a room which is being cleaned and should wait 1 hour after cleaning before going into the room. 15. Close and seal all heating outlets in the bedroom.  Otherwise, the room will become filled with dust-laden air.  An electric heater can be used to heat the room. 16. Reduce indoor humidity to less than 50%.  Do not use a humidifier.

## 2017-05-03 NOTE — Progress Notes (Signed)
NEW PATIENT  Date of Service/Encounter:  05/03/17  Referring provider: [name], MD   Assessment:   Mild persistent asthma, uncomplicated  Non-seasonal allergic rhinitis (grasses, ragweed, trees, molds, dust mite, cat, dog, cockroach)  Adverse food reaction (shellfish) - with negative testing today   Asthma Reportables:  Severity: mild persistent  Risk: low Control: not well controlled    Plan/Recommendations:   1. Mild persistent asthma, uncomplicated - Lung function looked good today, but you chronic cough could be a sign of uncontrolled asthma - I would recommend starting an inhaled steroid to see if this helps your cough. - Call us back if you like the medication and we can send in a prescription.  - Daily controller medication(s): Qvar 3mcg two puffs twice daily (sample provided) - Rescue medications: albuterol 4 puffs every 4-6 hours as needed - Changes during respiratory infections or worsening symptoms: increase Qvar 38mcg to 4 puffs twice daily for TWO WEEKS. - Asthma control goals:  * Full participation in all desired activities (may need albuterol before activity) * Albuterol use two time or less a week on average (not counting use with activity) * Cough interfering with sleep two time or less a month * Oral steroids no more than once a year * No hospitalizations  2. Chronic allergic rhinitis - Testing today showed: trees, weeds, grasses, molds, dust mites, cat, dog and cockroach - Avoidance measures provided. - Continue with Allegra (fexofenadine) 180mg  table once daily. You can add an extra dose as needed for breakthrough symptoms.  - Start Dymista (fluticasone/azelastine) two sprays per nostril 1-2 times daily as needed and Pazeo (olopatadine) one drop per eye daily as needed - Consider allergy shots as a means of long-term control. - Allergy shots "re-train" the immune system to ignore environmental allergens and decrease the resulting immune  response to those allergens.  - Call us after you talk to your insurance company.  - Then you can make an appointment   3. Adverse food reaction - Testing to all of the shellfish and fish were negative. - No EpiPen indicated at this time. - She is not planning to eat of this, but she is reassured that she Holmes not need to be afraid of it.  - We did discuss that the skin testing has excellent negative predictive value, therefore we can be assured that the negatives are indeed negative.   4. Return in about 2 months (around 07/03/2017).   Subjective:   Jacqueline Holmes is a 46 y.o. female presenting today for evaluation of  Chief Complaint  Patient presents with  . Nasal Congestion  . Allergic Rhinitis     Jacqueline Holmes has a history of the following: Patient Active Problem List   Diagnosis Date Noted  . Thyroid nodule, uninodular 09/28/2011  . Unspecified hypothyroidism 09/10/2011  . Allergic rhinitis, cause unspecified 09/10/2011    History obtained from: chart review and patient.  Jacqueline Holmes was referred by [name], MD.     Jacqueline Holmes is a 46 y.o. female presenting for an allergy evaluation. She has had allergic since she was a child. She was on allergy shots for 3-4 years when she was younger (around 30-72 years old) and then again as an adult around 20 years ago for 2-3 years (around late 60s). She was living in [place] at the time. She has grown up in [place]. She has symptoms throughout the year, worsening around March with the tree pollens. It has worsened over the past 4-5  years. She endorses rhinorrhea during the tree pollen season, but she stays congested throughout the year. She Holmes use nasal saline rinses intermittently which Holmes not seem to help. She Holmes snore at night. She has been using Allegrsa which Holmes take the edge off of her symptoms. She has to take it BID during the worst part of the year and supplements with benadryl throughout the year. She has tried  multiple nasal steroids without improvement. She has been on Astelin in the distant past. She has not been on Dymista in the past. Jacqueline Holmes get sinus infections around 2-3 times per year that require treatment with antibiotics. The antibiotics do help but she only stays well for around 1-2 weeks.   Jacqueline Holmes have asthma and has albuterol that she uses as needed. It rarely requires the use of the inhaler - maybe 5-6 times per year. She estimates that she coughs constantly throughout the day and night. She is unsure whether the cough is uncontrolled asthma or postnasal drip. She rarely requires prednisone for breathing, last requiring it in April 2018.   Jacqueline Holmes have a "mild allergy" to shellfish via skin testing. She has never reacted, however, and avoids seafood in general. She did have a bad experience in middle school when they were cooking fish in the science labs and she developed stomach pain. She Holmes not like the smell of it at this time. She can tolerate the scent of fish now but she still Holmes not like it.   She did have a thyroidectomy in 2011 due to Graves disease. She Holmes not see an Musician but sees an Brewing technologist in [place]. Otherwise there is no history of autoimmunity, including hives. Otherwise, there is no history of other atopic diseases, including drug allergies, stinging insect allergies, or urticaria. There is no significant infectious history. Vaccinations are up to date.    Past Medical History: Patient Active Problem List   Diagnosis Date Noted  . Thyroid nodule, uninodular 09/28/2011  . Unspecified hypothyroidism 09/10/2011  . Allergic rhinitis, cause unspecified 09/10/2011    Medication List:  Allergies as of 05/03/2017      Reactions   Shellfish Allergy Nausea And Vomiting      Medication List       Accurate as of 05/03/17 11:25 AM. Always use your most recent med list.          acetaminophen 500 MG tablet Commonly known  as:  TYLENOL Take 1,000 mg by mouth every 6 (six) hours as needed for headache. Reported on 06/17/2016   ALLEGRA PO Take by mouth.   calcium-vitamin D 500-200 MG-UNIT tablet Commonly known as:  OSCAL WITH D Take 1 tablet by mouth daily with breakfast.   cholecalciferol 1000 units tablet Commonly known as:  VITAMIN D Take 5,000 Units by mouth daily.   ibuprofen 200 MG tablet Commonly known as:  ADVIL,MOTRIN Take 600-800 mg by mouth every 6 (six) hours as needed.   metFORMIN 500 MG tablet Commonly known as:  GLUCOPHAGE Take 1,000 mg by mouth daily with breakfast.   NONFORMULARY OR COMPOUNDED ITEM [facility]: Onychomycosis Nail Lacquer - Fluconazole 2%, Terbinafine 1%, +Urea as maximum amount for this compound   PROAIR HFA 108 (90 Base) MCG/ACT inhaler Generic drug:  albuterol Inhale 2 puffs into the lungs every 4 (four) hours as needed.   progesterone 100 MG capsule Commonly known as:  PROMETRIUM Take 100 mg by mouth daily.   terbinafine 250 MG tablet  Commonly known as:  LAMISIL Take 1 tablet (250 mg total) by mouth daily.   thyroid 90 MG tablet Commonly known as:  ARMOUR Take 90 mg by mouth every morning.   thyroid 65 MG tablet Commonly known as:  ARMOUR Take 65 mg by mouth daily after lunch.       Birth History: non-contributory.    Developmental History: non-contributory.   Past Surgical History: Past Surgical History:  Procedure Laterality Date  . ABDOMINOPLASTY  12/2015   Dr. Towanda Malkin  . CESAREAN SECTION    . DILATATION & CURETTAGE/HYSTEROSCOPY WITH TRUECLEAR N/A 01/16/2014   Procedure: DILATATION & CURETTAGE/HYSTEROSCOPY WITH TRUCLEAR;  Surgeon: Marylynn Pearson, MD;  Location: [facility];  Service: Gynecology;  Laterality: N/A;  . LASIK    . TOTAL THYROIDECTOMY  07/30/11   Dr. Harlow Asa (Grave's disease)  . VULVA Milagros Loll BIOPSY N/A 01/16/2014   Procedure: VULVAR BIOPSY;  Surgeon: Marylynn Pearson, MD;  Location: [facility];  Service: Gynecology;   Laterality: N/A;     Family History: Family History  Problem Relation Age of Onset  . Cancer Father        prostate  . Allergic rhinitis Mother   . Asthma Mother   . Alpha-1 antitrypsin deficiency Mother   . Cancer Maternal Grandmother        pancreatic  . Heart disease Maternal Grandfather        CABG in 60's  . Diabetes Paternal Grandmother   . Diabetes Paternal Grandfather   . Heart disease Paternal Grandfather   . Angioedema Neg Hx   . Eczema Neg Hx      Social History: Jacqueline Holmes lives at home with her husband. They have a brother and sister-in-law part time as well, who have a cat that they bring with them. The cat has not seemed to worsen her symptoms. 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 She works at [employer] doing this. 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     Review of Systems: a 14-point review of systems is pertinent for what is mentioned in HPI.  Otherwise, all other systems were negative. Constitutional: negative other than that listed in the HPI Eyes: negative other than that listed in the HPI Ears, nose, mouth, throat, and face: negative other than that listed in the HPI Respiratory: negative other than that listed in the HPI Cardiovascular: negative other than that listed in the HPI Gastrointestinal: negative other than that listed in the HPI Genitourinary: negative other than that listed in the HPI Integument: negative other than that listed in the HPI Hematologic: negative other than that listed in the HPI Musculoskeletal: negative other than that listed in the HPI Neurological: negative other than that listed in the HPI Allergy/Immunologic: negative other than that listed in the HPI    Objective:   Blood pressure 132/80, pulse 72, temperature 98.6 F (37 C), temperature source Tympanic, resp. rate 16, height 5' 1.75" (1.568 m), weight 154 lb 3.2 oz (69.9 kg). Body mass index is 28.43  kg/m.   Physical Exam:  General: Alert, interactive, in no acute distress. Pleasant female. Wearing interesting jewelry.  Eyes: No conjunctival injection present on the right, No conjunctival injection present on the left, PERRL bilaterally, No discharge on the right, No discharge on the left and No Horner-Trantas dots present Ears: Right TM pearly gray with normal light reflex, Left TM pearly gray with normal light reflex, Right TM intact without perforation and Left TM  intact without perforation.  Nose/Throat: External nose within normal limits, nasal crease present and septum midline, turbinates edematous and pale with clear discharge, post-pharynx erythematous with cobblestoning in the posterior oropharynx. Tonsils 2+ without exudates Neck: Supple without thyromegaly.  Adenopathy: no enlarged lymph nodes appreciated in the anterior cervical, occipital, axillary, epitrochlear, inguinal, or popliteal regions Lungs: Clear to auscultation without wheezing, rhonchi or rales. No increased work of breathing. CV: Normal S1/S2, no murmurs. Capillary refill <2 seconds.  Abdomen: Nondistended, nontender. No guarding or rebound tenderness. Bowel sounds present in all fields and hyperactive  Skin: Warm and dry, without lesions or rashes. Extremities:  No clubbing, cyanosis or edema. Neuro:   Grossly intact. No focal deficits appreciated. Responsive to questions.  Diagnostic studies:   Spirometry: results normal (FEV1: 2.26/90%, FVC: 2.47/82%, FEV1/FVC: 91%).    Spirometry consistent with normal pattern.  Allergy Studies:   Indoor/Outdoor Percutaneous Adult Environmental Panel: positive to bahia grass, [place] grass, johnson grass, [place] blue grass, meadow fescue grass, perennial rye grass, sweet vernal grass, timothy grass, short ragweed, ash, birch, American beech, Box elder, red cedar, [place] cottonwood, maple, oak, pecan pollen, pine, [place] sycamore, black walnut pollen, Df mite, Dp mites  and cat. Otherwise negative with adequate controls.  Indoor/Outdoor Selected Intradermal Environmental Panel: positive to mold mix #4, dog and cockroach. Otherwise negative with adequate controls.  Selected Foods Panel (fish, shellfish): negative to all in the panel with adequate controls     Salvatore Marvel, MD [facility]

## 2017-06-24 ENCOUNTER — Other Ambulatory Visit: Payer: Self-pay

## 2017-06-24 ENCOUNTER — Telehealth: Payer: Self-pay | Admitting: Allergy & Immunology

## 2017-06-24 ENCOUNTER — Telehealth: Payer: Self-pay

## 2017-06-24 MED ORDER — FLUTICASONE PROPIONATE HFA 110 MCG/ACT IN AERO: 2.0000 | INHALATION_SPRAY | Freq: Two times a day (BID) | RESPIRATORY_TRACT | 5 refills | Status: DC

## 2017-06-24 MED ORDER — BECLOMETHASONE DIPROP HFA 80 MCG/ACT IN AERB: 2.0000 | INHALATION_SPRAY | Freq: Two times a day (BID) | RESPIRATORY_TRACT | 5 refills | Status: DC

## 2017-06-24 MED ORDER — AZELASTINE-FLUTICASONE 137-50 MCG/ACT NA SUSP: 2.0000 | Freq: Two times a day (BID) | NASAL | 5 refills | Status: DC

## 2017-06-24 NOTE — Telephone Encounter (Signed)
Called patient. I informed patient that we sent in Flovent 110 2 puffs twice a day per Dr. Ernst Bowler.

## 2017-06-24 NOTE — Telephone Encounter (Signed)
Called patient. Left message informing patient that I sent in the Qvar 80 and the Dymista to the [facility]. PZP:SUGAYGE brought In Consent papers to start injections.

## 2017-06-24 NOTE — Progress Notes (Signed)
We received notification from Ms. Prada that she is interested in pursuing allergen immunotherapy. Prescriptions written and routed to the Immunotherapy Team.   Salvatore Marvel, MD [facility]

## 2017-06-24 NOTE — Telephone Encounter (Signed)
Received fax from [facility] in regards to a PA for [employer]. I consulted with Dr. Ernst Bowler and asked me to send in [place] 110. I sent script.

## 2017-06-24 NOTE — Telephone Encounter (Signed)
Pt came in and brought paper to start injection and need to have Qvar 80 and Dymista [facility]. [phone].

## 2017-06-24 NOTE — Addendum Note (Signed)
Addended by: Valentina Shaggy on: 06/24/2017 06:33 PM   Modules accepted: Orders

## 2017-06-29 NOTE — Progress Notes (Signed)
Vials exp 06-30-18

## 2017-06-30 DIAGNOSIS — J301 Allergic rhinitis due to pollen: Secondary | ICD-10-CM | POA: Diagnosis not present

## 2017-07-01 DIAGNOSIS — J3089 Other allergic rhinitis: Secondary | ICD-10-CM | POA: Diagnosis not present

## 2017-07-08 ENCOUNTER — Ambulatory Visit: Payer: BC Managed Care – PPO

## 2017-07-08 ENCOUNTER — Other Ambulatory Visit: Payer: Self-pay | Admitting: *Deleted

## 2017-07-08 ENCOUNTER — Encounter: Payer: Self-pay | Admitting: Allergy & Immunology

## 2017-07-08 ENCOUNTER — Ambulatory Visit (INDEPENDENT_AMBULATORY_CARE_PROVIDER_SITE_OTHER): Payer: BC Managed Care – PPO | Admitting: Allergy & Immunology

## 2017-07-08 VITALS — BP 104/60 | HR 72 | Resp 16

## 2017-07-08 DIAGNOSIS — J301 Allergic rhinitis due to pollen: Secondary | ICD-10-CM | POA: Diagnosis not present

## 2017-07-08 DIAGNOSIS — J4530 Mild persistent asthma, uncomplicated: Secondary | ICD-10-CM | POA: Diagnosis not present

## 2017-07-08 DIAGNOSIS — J309 Allergic rhinitis, unspecified: Secondary | ICD-10-CM

## 2017-07-08 DIAGNOSIS — T781XXD Other adverse food reactions, not elsewhere classified, subsequent encounter: Secondary | ICD-10-CM | POA: Diagnosis not present

## 2017-07-08 NOTE — Patient Instructions (Addendum)
1. Mild persistent asthma, uncomplicated - Lung function looked good today. - I am glad that the inhaled steroid has helped your coughing. - We will fill out a PA for the Qvar to se eif we can get that approved.  - Daily controller medication(s): Qvar 22mcg two puffs twice daily - Rescue medications: albuterol 4 puffs every 4-6 hours as needed - Changes during respiratory infections or worsening symptoms: increase Qvar 54mcg to 4 puffs twice daily for TWO WEEKS. - Asthma control goals:  * Full participation in all desired activities (may need albuterol before activity) * Albuterol use two time or less a week on average (not counting use with activity) * Cough interfering with sleep two time or less a month * Oral steroids no more than once a year * No hospitalizations  2. Chronic rhinitis (trees, weeds, grasses, molds, dust mites, cat, dog and cockroach) - Continue with Allegra (fexofenadine) 180mg  table once daily. You can add an extra dose as needed for breakthrough symptoms.  - Continue with Dymista (fluticasone/azelastine) two sprays per nostril 1-2 times daily as needed and Pazeo (olopatadine) one drop per eye daily as needed - Continue with allergen immunotherapy.   3. Return in about 6 months (around 01/08/2018).  Please inform us of any Emergency Department visits, hospitalizations, or changes in symptoms. Call us before going to the ED for breathing or allergy symptoms since we might be able to fit you in for a sick visit. Feel free to contact us anytime with any questions, problems, or concerns.  It was a pleasure to see you again today! Happy spring!   Websites that have reliable patient information: 1. American Academy of Asthma, Allergy, and Immunology: www.aaaai.org 2. Food Allergy Research and Education (FARE): foodallergy.org 3. Mothers of Asthmatics: http://www.asthmacommunitynetwork.org 4. American College of Allergy, Asthma, and Immunology: www.acaai.org

## 2017-07-08 NOTE — Progress Notes (Signed)
Immunotherapy   Patient Details  Name: Jacqueline Holmes MRN: [id number] Date of Birth: 08-Aug-1971  07/08/2017  Perrin Maltese started injections for  Grass-Weed-Mite-Cat & Mold-CR.  Following schedule: B  Frequency:2 times per week Epi-Pen:Epi-Pen Available  Consent signed and patient instructions given. No problems after 30 minutes in the office.   Constance Holster 07/08/2017, 4:46 PM

## 2017-07-08 NOTE — Progress Notes (Signed)
FOLLOW UP  Date of Service/Encounter:  07/08/17   Assessment:   Mild persistent asthma, uncomplicated  Non-seasonal allergic rhinitis (trees, weeds, grasses, molds, dust mites, cat, dog and cockroach)  Adverse food reaction (shellfish/fish) - with negative testing  Asthma Reportables:  Severity: mild persistent  Risk: low Control: well controlled  Plan/Recommendations:   1. Mild persistent asthma, uncomplicated - Lung function looked good today. - I am glad that the inhaled steroid has helped your coughing. - We will fill out a PA for the Qvar to se eif we can get that approved.  - Daily controller medication(s): Qvar 35mcg two puffs twice daily - Rescue medications: albuterol 4 puffs every 4-6 hours as needed - Changes during respiratory infections or worsening symptoms: increase Qvar 24mcg to 4 puffs twice daily for TWO WEEKS. - Asthma control goals:  * Full participation in all desired activities (may need albuterol before activity) * Albuterol use two time or less a week on average (not counting use with activity) * Cough interfering with sleep two time or less a month * Oral steroids no more than once a year * No hospitalizations  2. Chronic rhinitis (trees, weeds, grasses, molds, dust mites, cat, dog and cockroach) - Continue with Allegra (fexofenadine) 180mg  table once daily. You can add an extra dose as needed for breakthrough symptoms.  - Continue with Dymista (fluticasone/azelastine) two sprays per nostril 1-2 times daily as needed and Pazeo (olopatadine) one drop per eye daily as needed - Continue with allergen immunotherapy.   3. Return in about 6 months (around 01/08/2018).  Subjective:   Jacqueline Holmes is a 46 y.o. female presenting today for follow up of  Chief Complaint  Patient presents with  . Asthma    Jacqueline Holmes has a history of the following: Patient Active Problem List   Diagnosis Date Noted  . Non-seasonal allergic rhinitis due to pollen  05/03/2017  . Mild persistent asthma, uncomplicated 76/54/6503  . Thyroid nodule, uninodular 09/28/2011  . Unspecified hypothyroidism 09/10/2011  . Allergic rhinitis, cause unspecified 09/10/2011    History obtained from: chart review and patient.  Loma Boston Partin's Primary Care Provider is [name], MD.     Jacqueline Holmes is a 46 y.o. female presenting for a follow up visit. I last saw [name] in June 2018. At that time, she had normal lung function, but her chronic cough resulted in our starting a controller medication. We started Qvar 80 g 2 puffs twice daily. She had allergy testing which showed sensitizations to grasses, weeds, trees, molds, dust mite, cat, dog, and cockroach. We started Dymista 2 sprays per nostril 1-2 times daily and olopatadine eyedrops 1 drop per eye daily. We continued her on Allegra 1 tablet once daily. She was interested in starting allergy shots. She had testing to fish and shellfish that was negative.  Since the last visit, she has done well from a coughing perspective. She did like the Qvar but it had to be changed to Flovent due to insurance coverage. She does not like the Flovent. She feels less controlled on the Flovent overall compared to the Qvar 63mcg. She has not needed her rescue medication much at all and the nighttime coughing improved. Otherwise, [name]'s asthma has been well controlled. She has not required rescue medication, experienced nocturnal awakenings due to lower respiratory symptoms, nor have activities of daily living been limited. She has required no Emergency Department or Urgent Care visits for her asthma. She has required zero courses of systemic steroids  for asthma exacerbations since the last visit. ACT score today is 25, indicating excellent asthma symptom control.   She remains on the Dymista two sprays per nostril daily. This was covered by her insurance and she is now paying aroiund $20 for the copayment. 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 She has not filled the eye drops. She is starting her immunotherapy today, but she does not have an [place] or EpiPen.   Otherwise, there have been no changes to her past medical history, surgical history, family history, or social history.    Review of Systems: a 14-point review of systems is pertinent for what is mentioned in HPI.  Otherwise, all other systems were negative. Constitutional: negative other than that listed in the HPI Eyes: negative other than that listed in the HPI Ears, nose, mouth, throat, and face: negative other than that listed in the HPI Respiratory: negative other than that listed in the HPI Cardiovascular: negative other than that listed in the HPI Gastrointestinal: negative other than that listed in the HPI Genitourinary: negative other than that listed in the HPI Integument: negative other than that listed in the HPI Hematologic: negative other than that listed in the HPI Musculoskeletal: negative other than that listed in the HPI Neurological: negative other than that listed in the HPI Allergy/Immunologic: negative other than that listed in the HPI    Objective:   Blood pressure 104/60, pulse 72, resp. rate 16. There is no height or weight on file to calculate BMI.   Physical Exam:  General: Alert, interactive, in no acute distress. Pleasant female.  Eyes: No conjunctival injection present on the right, No conjunctival injection present on the left, PERRL bilaterally, No discharge on the right, No discharge on the left and No Horner-Trantas dots present Ears: Right TM pearly gray with normal light reflex, Left TM pearly gray with normal light reflex, Right TM intact without perforation and Left TM intact without perforation.  Nose/Throat: External nose within normal limits and septum midline, turbinates edematous and pale with clear discharge, post-pharynx erythematous with cobblestoning in the posterior oropharynx. Tonsils 2+  without exudates Neck: Supple without thyromegaly. Lungs: Clear to auscultation without wheezing, rhonchi or rales. No increased work of breathing. CV: Normal S1/S2, no murmurs. Capillary refill <2 seconds.  Skin: Warm and dry, without lesions or rashes. Neuro:   Grossly intact. No focal deficits appreciated. Responsive to questions.   Diagnostic studies:   Spirometry: results normal (FEV1: 2.29/86%, FVC: 2.51/75%, FEV1/FVC: 91%).    Spirometry consistent with normal pattern.   Allergy Studies: none     Salvatore Marvel, MD [facility]

## 2017-07-15 ENCOUNTER — Ambulatory Visit (INDEPENDENT_AMBULATORY_CARE_PROVIDER_SITE_OTHER): Payer: BC Managed Care – PPO | Admitting: *Deleted

## 2017-07-15 DIAGNOSIS — J309 Allergic rhinitis, unspecified: Secondary | ICD-10-CM

## 2017-07-15 MED ORDER — EPINEPHRINE 0.3 MG/0.3ML IJ SOAJ: 0.3000 mg | Freq: Once | INTRAMUSCULAR | 0 refills | Status: AC

## 2017-07-20 ENCOUNTER — Ambulatory Visit (INDEPENDENT_AMBULATORY_CARE_PROVIDER_SITE_OTHER): Payer: BC Managed Care – PPO | Admitting: *Deleted

## 2017-07-20 DIAGNOSIS — J309 Allergic rhinitis, unspecified: Secondary | ICD-10-CM | POA: Diagnosis not present

## 2017-07-26 ENCOUNTER — Ambulatory Visit (INDEPENDENT_AMBULATORY_CARE_PROVIDER_SITE_OTHER): Payer: BC Managed Care – PPO

## 2017-07-26 DIAGNOSIS — J309 Allergic rhinitis, unspecified: Secondary | ICD-10-CM

## 2017-07-28 ENCOUNTER — Ambulatory Visit (INDEPENDENT_AMBULATORY_CARE_PROVIDER_SITE_OTHER): Payer: BC Managed Care – PPO

## 2017-07-28 DIAGNOSIS — J309 Allergic rhinitis, unspecified: Secondary | ICD-10-CM | POA: Diagnosis not present

## 2017-08-03 ENCOUNTER — Ambulatory Visit (INDEPENDENT_AMBULATORY_CARE_PROVIDER_SITE_OTHER): Payer: BC Managed Care – PPO

## 2017-08-03 DIAGNOSIS — J309 Allergic rhinitis, unspecified: Secondary | ICD-10-CM

## 2017-08-05 ENCOUNTER — Ambulatory Visit (INDEPENDENT_AMBULATORY_CARE_PROVIDER_SITE_OTHER): Payer: BC Managed Care – PPO | Admitting: *Deleted

## 2017-08-05 DIAGNOSIS — J309 Allergic rhinitis, unspecified: Secondary | ICD-10-CM | POA: Diagnosis not present

## 2017-08-09 ENCOUNTER — Ambulatory Visit (INDEPENDENT_AMBULATORY_CARE_PROVIDER_SITE_OTHER): Payer: BC Managed Care – PPO

## 2017-08-09 DIAGNOSIS — J309 Allergic rhinitis, unspecified: Secondary | ICD-10-CM

## 2017-08-13 ENCOUNTER — Ambulatory Visit (INDEPENDENT_AMBULATORY_CARE_PROVIDER_SITE_OTHER): Payer: BC Managed Care – PPO

## 2017-08-13 DIAGNOSIS — J309 Allergic rhinitis, unspecified: Secondary | ICD-10-CM | POA: Diagnosis not present

## 2017-08-17 ENCOUNTER — Ambulatory Visit (INDEPENDENT_AMBULATORY_CARE_PROVIDER_SITE_OTHER): Payer: BC Managed Care – PPO | Admitting: *Deleted

## 2017-08-17 DIAGNOSIS — J309 Allergic rhinitis, unspecified: Secondary | ICD-10-CM | POA: Diagnosis not present

## 2017-08-19 ENCOUNTER — Ambulatory Visit (INDEPENDENT_AMBULATORY_CARE_PROVIDER_SITE_OTHER): Payer: BC Managed Care – PPO

## 2017-08-19 DIAGNOSIS — J309 Allergic rhinitis, unspecified: Secondary | ICD-10-CM | POA: Diagnosis not present

## 2017-08-26 ENCOUNTER — Ambulatory Visit (INDEPENDENT_AMBULATORY_CARE_PROVIDER_SITE_OTHER): Payer: BC Managed Care – PPO | Admitting: *Deleted

## 2017-08-26 DIAGNOSIS — J309 Allergic rhinitis, unspecified: Secondary | ICD-10-CM | POA: Diagnosis not present

## 2017-08-31 ENCOUNTER — Ambulatory Visit (INDEPENDENT_AMBULATORY_CARE_PROVIDER_SITE_OTHER): Payer: BC Managed Care – PPO | Admitting: *Deleted

## 2017-08-31 DIAGNOSIS — J309 Allergic rhinitis, unspecified: Secondary | ICD-10-CM

## 2017-09-02 ENCOUNTER — Ambulatory Visit (INDEPENDENT_AMBULATORY_CARE_PROVIDER_SITE_OTHER): Payer: BC Managed Care – PPO

## 2017-09-02 DIAGNOSIS — J309 Allergic rhinitis, unspecified: Secondary | ICD-10-CM | POA: Diagnosis not present

## 2017-09-15 ENCOUNTER — Ambulatory Visit (INDEPENDENT_AMBULATORY_CARE_PROVIDER_SITE_OTHER): Payer: BC Managed Care – PPO | Admitting: *Deleted

## 2017-09-15 DIAGNOSIS — J309 Allergic rhinitis, unspecified: Secondary | ICD-10-CM

## 2017-09-20 ENCOUNTER — Ambulatory Visit (INDEPENDENT_AMBULATORY_CARE_PROVIDER_SITE_OTHER): Payer: BC Managed Care – PPO

## 2017-09-20 DIAGNOSIS — J309 Allergic rhinitis, unspecified: Secondary | ICD-10-CM

## 2017-09-29 ENCOUNTER — Ambulatory Visit (INDEPENDENT_AMBULATORY_CARE_PROVIDER_SITE_OTHER): Payer: BC Managed Care – PPO | Admitting: *Deleted

## 2017-09-29 DIAGNOSIS — J309 Allergic rhinitis, unspecified: Secondary | ICD-10-CM | POA: Diagnosis not present

## 2017-10-01 ENCOUNTER — Ambulatory Visit (INDEPENDENT_AMBULATORY_CARE_PROVIDER_SITE_OTHER): Payer: BC Managed Care – PPO

## 2017-10-01 DIAGNOSIS — J309 Allergic rhinitis, unspecified: Secondary | ICD-10-CM | POA: Diagnosis not present

## 2017-10-05 ENCOUNTER — Ambulatory Visit (INDEPENDENT_AMBULATORY_CARE_PROVIDER_SITE_OTHER): Payer: BC Managed Care – PPO | Admitting: *Deleted

## 2017-10-05 DIAGNOSIS — J309 Allergic rhinitis, unspecified: Secondary | ICD-10-CM

## 2017-10-13 ENCOUNTER — Ambulatory Visit (INDEPENDENT_AMBULATORY_CARE_PROVIDER_SITE_OTHER): Payer: BC Managed Care – PPO | Admitting: *Deleted

## 2017-10-13 DIAGNOSIS — J309 Allergic rhinitis, unspecified: Secondary | ICD-10-CM | POA: Diagnosis not present

## 2017-10-15 ENCOUNTER — Telehealth: Payer: Self-pay

## 2017-10-15 ENCOUNTER — Ambulatory Visit (INDEPENDENT_AMBULATORY_CARE_PROVIDER_SITE_OTHER): Payer: BC Managed Care – PPO

## 2017-10-15 DIAGNOSIS — J309 Allergic rhinitis, unspecified: Secondary | ICD-10-CM

## 2017-10-15 NOTE — Telephone Encounter (Signed)
Reviewed note. Let's back her down to Green 0.29mL and change her to Schedule A.   Thanks, Salvatore Marvel, MD [facility] [place]

## 2017-10-15 NOTE — Telephone Encounter (Signed)
Patient came in today to get her injection and she stated that on her last injection that she had a 3+ bump on her left arm. From looking at her record she has had that size of reaction on her last three injections with getting 0.4. She is in her green vials and she gets molds, cr, grass, weeds, tree, dm and cat. She is currently on schedule B. I gave her 0.3 on todayand she still had a 3+ reaction to the grass,weeds,tree,dm,cat. Please advise

## 2017-10-18 NOTE — Telephone Encounter (Signed)
Documented on flowsheet.

## 2017-10-26 ENCOUNTER — Ambulatory Visit (INDEPENDENT_AMBULATORY_CARE_PROVIDER_SITE_OTHER): Payer: BC Managed Care – PPO | Admitting: *Deleted

## 2017-10-26 DIAGNOSIS — J309 Allergic rhinitis, unspecified: Secondary | ICD-10-CM

## 2017-11-05 ENCOUNTER — Ambulatory Visit (INDEPENDENT_AMBULATORY_CARE_PROVIDER_SITE_OTHER): Payer: BC Managed Care – PPO | Admitting: Allergy

## 2017-11-05 DIAGNOSIS — J309 Allergic rhinitis, unspecified: Secondary | ICD-10-CM

## 2017-11-11 ENCOUNTER — Ambulatory Visit (INDEPENDENT_AMBULATORY_CARE_PROVIDER_SITE_OTHER): Payer: BC Managed Care – PPO | Admitting: *Deleted

## 2017-11-11 DIAGNOSIS — J309 Allergic rhinitis, unspecified: Secondary | ICD-10-CM

## 2017-11-17 ENCOUNTER — Ambulatory Visit (INDEPENDENT_AMBULATORY_CARE_PROVIDER_SITE_OTHER): Payer: BC Managed Care – PPO

## 2017-11-17 DIAGNOSIS — J309 Allergic rhinitis, unspecified: Secondary | ICD-10-CM | POA: Diagnosis not present

## 2017-11-26 ENCOUNTER — Ambulatory Visit (INDEPENDENT_AMBULATORY_CARE_PROVIDER_SITE_OTHER): Payer: BC Managed Care – PPO

## 2017-11-26 DIAGNOSIS — J309 Allergic rhinitis, unspecified: Secondary | ICD-10-CM

## 2017-12-03 ENCOUNTER — Ambulatory Visit (INDEPENDENT_AMBULATORY_CARE_PROVIDER_SITE_OTHER): Payer: BC Managed Care – PPO

## 2017-12-03 DIAGNOSIS — J309 Allergic rhinitis, unspecified: Secondary | ICD-10-CM

## 2017-12-09 ENCOUNTER — Ambulatory Visit (INDEPENDENT_AMBULATORY_CARE_PROVIDER_SITE_OTHER): Payer: BC Managed Care – PPO | Admitting: *Deleted

## 2017-12-09 DIAGNOSIS — J309 Allergic rhinitis, unspecified: Secondary | ICD-10-CM | POA: Diagnosis not present

## 2017-12-17 ENCOUNTER — Ambulatory Visit (INDEPENDENT_AMBULATORY_CARE_PROVIDER_SITE_OTHER): Payer: BC Managed Care – PPO

## 2017-12-17 DIAGNOSIS — J309 Allergic rhinitis, unspecified: Secondary | ICD-10-CM

## 2017-12-22 ENCOUNTER — Ambulatory Visit (INDEPENDENT_AMBULATORY_CARE_PROVIDER_SITE_OTHER): Payer: BC Managed Care – PPO

## 2017-12-22 DIAGNOSIS — J309 Allergic rhinitis, unspecified: Secondary | ICD-10-CM | POA: Diagnosis not present

## 2018-01-06 ENCOUNTER — Ambulatory Visit (INDEPENDENT_AMBULATORY_CARE_PROVIDER_SITE_OTHER): Payer: BC Managed Care – PPO | Admitting: *Deleted

## 2018-01-06 DIAGNOSIS — J309 Allergic rhinitis, unspecified: Secondary | ICD-10-CM | POA: Diagnosis not present

## 2018-01-18 ENCOUNTER — Encounter: Payer: Self-pay | Admitting: Allergy and Immunology

## 2018-01-18 ENCOUNTER — Ambulatory Visit: Payer: BC Managed Care – PPO | Admitting: Medical

## 2018-01-18 ENCOUNTER — Ambulatory Visit: Payer: BC Managed Care – PPO | Admitting: Allergy and Immunology

## 2018-01-18 VITALS — BP 140/94 | HR 92 | Temp 98.3°F | Resp 18

## 2018-01-18 DIAGNOSIS — J3089 Other allergic rhinitis: Secondary | ICD-10-CM | POA: Diagnosis not present

## 2018-01-18 DIAGNOSIS — J45901 Unspecified asthma with (acute) exacerbation: Secondary | ICD-10-CM

## 2018-01-18 DIAGNOSIS — J4541 Moderate persistent asthma with (acute) exacerbation: Secondary | ICD-10-CM | POA: Insufficient documentation

## 2018-01-18 DIAGNOSIS — H1010 Acute atopic conjunctivitis, unspecified eye: Secondary | ICD-10-CM | POA: Insufficient documentation

## 2018-01-18 DIAGNOSIS — R030 Elevated blood-pressure reading, without diagnosis of hypertension: Secondary | ICD-10-CM | POA: Insufficient documentation

## 2018-01-18 DIAGNOSIS — H1013 Acute atopic conjunctivitis, bilateral: Secondary | ICD-10-CM | POA: Diagnosis not present

## 2018-01-18 MED ORDER — PROAIR HFA 108 (90 BASE) MCG/ACT IN AERS: 2.0000 | INHALATION_SPRAY | RESPIRATORY_TRACT | 1 refills | Status: DC | PRN

## 2018-01-18 MED ORDER — BECLOMETHASONE DIPROP HFA 80 MCG/ACT IN AERB: 2.0000 | INHALATION_SPRAY | Freq: Two times a day (BID) | RESPIRATORY_TRACT | 5 refills | Status: DC

## 2018-01-18 MED ORDER — LEVOCETIRIZINE DIHYDROCHLORIDE 5 MG PO TABS: 5.0000 mg | ORAL_TABLET | Freq: Every evening | ORAL | 5 refills | Status: DC

## 2018-01-18 MED ORDER — OLOPATADINE HCL 0.7 % OP SOLN: 1.0000 [drp] | OPHTHALMIC | 5 refills | Status: DC

## 2018-01-18 NOTE — Assessment & Plan Note (Signed)
   Prednisone has been provided, 40 mg x3 days, 20 mg x1 day, 10 mg x1 day, then stop.  For now, and during all upper respiratory tract infections and asthma flares, increase Qvar 80 g Respiclick to 3 inhalations 3 times daily until symptoms have returned to baseline.  As spring is typically her worst asthma season, after this exacerbation, she will take Qvar 80 g Respiclick 2 inhalations twice daily rather than daily.   Continue albuterol HFA, 1-2 inhalations every 6 hours if needed.  The patient has been asked to contact me if her symptoms persist or progress. Otherwise, she may return for follow up in 4 months.

## 2018-01-18 NOTE — Progress Notes (Signed)
Follow-up Note  RE: Jacqueline Holmes MRN: [id number] DOB: 1971/07/15 Date of Office Visit: 01/18/2018  Primary care provider: [name], MD Referring provider: [name], MD  History of present illness: Jacqueline Holmes is a 47 y.o. female with persistent asthma and allergic rhinitis on immunotherapy presenting today for a sick visit.  She was previously seen in this clinic in August 2018 by Dr. Ernst Bowler.  She reports that 5 days ago appearance flulike symptoms with a fever of 103 myalgias.  The symptoms as well as a fever resolved within 2 days, however over the past 3 or 4 days she has been experiencing and she notes that she has been coughing almost to the point of vomiting.  She is currently taking Qvar 80 g ready inhaler, 2 inhalations daily in the morning.  She notes that spring pollen season is her worst allergy and asthma symptoms.  She has also been experiencing rhinorrhea, sneezing, and nasal pruritus over the past 24 hours which she believes is due to pollen exposure.   Assessment and plan: Asthma with acute exacerbation  Prednisone has been provided, 40 mg x3 days, 20 mg x1 day, 10 mg x1 day, then stop.  For now, and during all upper respiratory tract infections and asthma flares, increase Qvar 80 g Respiclick to 3 inhalations 3 times daily until symptoms have returned to baseline.  As spring is typically her worst asthma season, after this exacerbation, she will take Qvar 80 g Respiclick 2 inhalations twice daily rather than daily.   Continue albuterol HFA, 1-2 inhalations every 6 hours if needed.  The patient has been asked to contact me if her symptoms persist or progress. Otherwise, she may return for follow up in 4 months.  Allergic rhinitis Currently with suboptimal control.  Continue appropriate allergen avoidance measures.  Resume immunotherapy injections after this asthma exacerbation has resolved.  Dymista, 1-2 sprays per nostril twice daily as needed.  Nasal  saline spray (i.e., Simply Saline) or nasal saline lavage (i.e., NeilMed) is recommended as needed and prior to medicated nasal sprays.  A prescription has been provided for levocetirizine, 5 mg daily as needed.  Allergic conjunctivitis  Treatment plan as outlined above for allergic rhinitis.  A prescription has been provided for Pazeo, one drop per eye daily as needed.  I have also recommended eye lubricant drops (i.e., Natural Tears) as needed.  Elevated blood-pressure reading without diagnosis of hypertension  The patient has been made aware of the elevated blood pressure reading and has been encouraged to follow up with her primary care physician in the near future regarding this issue.  Aniylah has verbalized understanding.   Meds ordered this encounter  Medications  . PROAIR HFA 108 (90 Base) MCG/ACT inhaler    Sig: Inhale 2 puffs into the lungs every 4 (four) hours as needed.    Dispense:  1 Inhaler    Refill:  1  . beclomethasone (QVAR REDIHALER) 80 MCG/ACT inhaler    Sig: Inhale 2 puffs into the lungs 2 (two) times daily.    Dispense:  10.6 g    Refill:  5  . levocetirizine (XYZAL) 5 MG tablet    Sig: Take 1 tablet (5 mg total) by mouth every evening.    Dispense:  30 tablet    Refill:  5  . Olopatadine HCl (PAZEO) 0.7 % SOLN    Sig: Place 1 drop into both eyes 1 day or 1 dose.    Dispense:  1 Bottle  Refill:  5    Diagnostics: Prematurity reveals an FVC of 2.61 L and an FEV1 of 2.33 L (87% predicted) without bronchodilator improvement.  Please see scanned spirometry results for details.    Physical examination: Blood pressure (!) 140/94, pulse 92, temperature 98.3 F (36.8 C), temperature source Oral, resp. rate 18, SpO2 95 %.  General: Alert, interactive, in no acute distress. HEENT: TMs pearly gray, turbinates moderately edematous with clear discharge, post-pharynx mildly erythematous. Neck: Supple without lymphadenopathy. Lungs: Clear to auscultation  without wheezing, rhonchi or rales. CV: Normal S1, S2 without murmurs. Skin: Warm and dry, without lesions or rashes.  The following portions of the patient's history were reviewed and updated as appropriate: allergies, current medications, past family history, past medical history, past social history, past surgical history and problem list.  Allergies as of 01/18/2018   No Known Allergies     Medication List        Accurate as of 01/18/18 12:58 PM. Always use your most recent med list.          acetaminophen 500 MG tablet Commonly known as:  TYLENOL Take 1,000 mg by mouth every 6 (six) hours as needed for headache. Reported on 06/17/2016   ALLEGRA PO Take by mouth.   Azelastine-Fluticasone 137-50 MCG/ACT Susp Commonly known as:  DYMISTA Place 2 sprays into the nose 2 (two) times daily.   calcium-vitamin D 500-200 MG-UNIT tablet Commonly known as:  OSCAL WITH D Take 1 tablet by mouth daily with breakfast.   cholecalciferol 1000 units tablet Commonly known as:  VITAMIN D Take 5,000 Units by mouth daily.   fluticasone 110 MCG/ACT inhaler Commonly known as:  FLOVENT HFA Inhale 2 puffs into the lungs 2 (two) times daily.   ibuprofen 200 MG tablet Commonly known as:  ADVIL,MOTRIN Take 600-800 mg by mouth every 6 (six) hours as needed.   levocetirizine 5 MG tablet Commonly known as:  XYZAL Take 1 tablet (5 mg total) by mouth every evening.   metFORMIN 500 MG tablet Commonly known as:  GLUCOPHAGE Take 1,000 mg by mouth daily with breakfast.   montelukast 10 MG tablet Commonly known as:  SINGULAIR Take 1 tablet (10 mg total) by mouth at bedtime.   NATURE-THROID 97.5 MG Tabs Generic drug:  Thyroid   NONFORMULARY OR COMPOUNDED ITEM [facility]: Onychomycosis Nail Lacquer - Fluconazole 2%, Terbinafine 1%, +Urea as maximum amount for this compound   Olopatadine HCl 0.7 % Soln Commonly known as:  PAZEO Place 1 drop into both eyes 1 day or 1 dose.   PROAIR HFA  108 (90 Base) MCG/ACT inhaler Generic drug:  albuterol Inhale 2 puffs into the lungs every 4 (four) hours as needed.   progesterone 200 MG capsule Commonly known as:  PROMETRIUM   QVAR REDIHALER 80 MCG/ACT inhaler Generic drug:  beclomethasone Inhale 2 puffs into the lungs 2 (two) times daily.   beclomethasone 80 MCG/ACT inhaler Commonly known as:  QVAR REDIHALER Inhale 2 puffs into the lungs 2 (two) times daily.   terbinafine 250 MG tablet Commonly known as:  LAMISIL Take 1 tablet (250 mg total) by mouth daily.       No Known Allergies  Review of systems: Review of systems negative except as noted in HPI / PMHx or noted below: Constitutional: Negative.  HENT: Negative.   Eyes: Negative.  Respiratory: Negative.   Cardiovascular: Negative.  Gastrointestinal: Negative.  Genitourinary: Negative.  Musculoskeletal: Negative.  Neurological: Negative.  Endo/Heme/Allergies: Negative.  Cutaneous: Negative.  Past Medical History:  Diagnosis Date  . Asthma   . Cough   . Diabetes mellitus arising in pregnancy    now pre-diabetes  . Family history of breast cancer    MGM  . Fatigue   . Generalized headaches    migraines on occasion.  . Hyperlipidemia   . Hypothyroidism    s/p thyroidectomy for Grave's disease (Dr. Tye Savoy at [facility])  . IBS (irritable bowel syndrome)   . Incontinence   . Migraine   . Night sweats   . Sinus problem    runny nose  . SOB (shortness of breath)     Family History  Problem Relation Age of Onset  . Cancer Father        prostate  . Allergic rhinitis Mother   . Asthma Mother   . Alpha-1 antitrypsin deficiency Mother   . Cancer Maternal Grandmother        pancreatic  . Heart disease Maternal Grandfather        CABG in 60's  . Diabetes Paternal Grandmother   . Diabetes Paternal Grandfather   . Heart disease Paternal Grandfather   . Angioedema Neg Hx   . Eczema Neg Hx     Social History   Socioeconomic History    . Marital status: Married    Spouse name: Not on file  . Number of children: 1  . Years of education: Not on file  . Highest education level: Not on file  Social Needs  . Financial resource strain: Not on file  . Food insecurity - worry: Not on file  . Food insecurity - inability: Not on file  . Transportation needs - medical: Not on file  . Transportation needs - non-medical: Not on file  Occupational History  . 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 Smoking status: Never Smoker  . Smokeless tobacco: Never Used  Substance and Sexual Activity  . Alcohol use: Yes    Comment: once per week or less  . Drug use: No  . Sexual activity: Yes    Partners: Male    Birth control/protection: None  Other Topics Concern  . Not on file  Social History Narrative   Divorced and re-married.  Lives with her husband.  Child is grown and lives in own apartment    I appreciate the opportunity to take part in [place] care. Please do not hesitate to contact me with questions.  Sincerely,   R. Edgar Frisk, MD

## 2018-01-18 NOTE — Assessment & Plan Note (Addendum)
   The patient has been made aware of the elevated blood pressure reading and has been encouraged to follow up with her primary care physician in the near future regarding this issue.  Ka has verbalized understanding.

## 2018-01-18 NOTE — Assessment & Plan Note (Signed)
Currently with suboptimal control.  Continue appropriate allergen avoidance measures.  Resume immunotherapy injections after this asthma exacerbation has resolved.  Dymista, 1-2 sprays per nostril twice daily as needed.  Nasal saline spray (i.e., Simply Saline) or nasal saline lavage (i.e., NeilMed) is recommended as needed and prior to medicated nasal sprays.  A prescription has been provided for levocetirizine, 5 mg daily as needed.

## 2018-01-18 NOTE — Patient Instructions (Addendum)
Asthma with acute exacerbation  Prednisone has been provided, 40 mg x3 days, 20 mg x1 day, 10 mg x1 day, then stop.  For now, and during all upper respiratory tract infections and asthma flares, increase Qvar 80 g Respiclick to 3 inhalations 3 times daily until symptoms have returned to baseline.  As spring is typically her worst asthma season, after this exacerbation, she will take Qvar 80 g Respiclick 2 inhalations twice daily rather than daily.   Continue albuterol HFA, 1-2 inhalations every 6 hours if needed.  The patient has been asked to contact me if her symptoms persist or progress. Otherwise, she may return for follow up in 4 months.  Allergic rhinitis Currently with suboptimal control.  Continue appropriate allergen avoidance measures.  Resume immunotherapy injections after this asthma exacerbation has resolved.  Dymista, 1-2 sprays per nostril twice daily as needed.  Nasal saline spray (i.e., Simply Saline) or nasal saline lavage (i.e., NeilMed) is recommended as needed and prior to medicated nasal sprays.  A prescription has been provided for levocetirizine, 5 mg daily as needed.  Allergic conjunctivitis  Treatment plan as outlined above for allergic rhinitis.  A prescription has been provided for Pazeo, one drop per eye daily as needed.  I have also recommended eye lubricant drops (i.e., Natural Tears) as needed.  Elevated blood-pressure reading without diagnosis of hypertension  The patient has been made aware of the elevated blood pressure reading and has been encouraged to follow up with her primary care physician in the near future regarding this issue.  Jacqueline Holmes has verbalized understanding.   Return in about 4 months (around 05/18/2018), or if symptoms worsen or fail to improve.

## 2018-01-18 NOTE — Assessment & Plan Note (Signed)
   Treatment plan as outlined above for allergic rhinitis.  A prescription has been provided for Pazeo, one drop per eye daily as needed.  I have also recommended eye lubricant drops (i.e., Natural Tears) as needed. 

## 2018-01-20 ENCOUNTER — Telehealth: Payer: Self-pay | Admitting: Allergy and Immunology

## 2018-01-20 MED ORDER — BUDESONIDE-FORMOTEROL FUMARATE 160-4.5 MCG/ACT IN AERO: INHALATION_SPRAY | RESPIRATORY_TRACT | 5 refills | Status: DC

## 2018-01-20 MED ORDER — OPTICHAMBER DIAMOND DEVI: 2 refills | Status: DC

## 2018-01-20 NOTE — Telephone Encounter (Signed)
Per Dr. Verlin Fester, only use spacer with Symbicort 160 as the Qvar 80 mcg is the Redihaler.  Call patient and inform of this.  If patient is no better, may call tomorrow and get worked in by [employer]. Called patient.  Informed.  Sent in rx.

## 2018-01-20 NOTE — Telephone Encounter (Signed)
Patient saw Dr. Verlin Fester on 01-18-18 for a respiratory infection. She was told to call back if she did not improve. She has not improved.

## 2018-01-20 NOTE — Telephone Encounter (Signed)
Continue prednisone burst/taper as directed.  Please send in a prescription for Symbicort 160-4.5 g, 2 inhalations via spacer device twice daily.  For now, the patient will take this in addition to Qvar 80 g, 2 inhalations via spacer device twice daily.  Once symptoms have returned to baseline, she may put the Symbicort on a shelf and continue with the Qvar 80 g, 2 inhalations via spacer device twice daily.

## 2018-01-20 NOTE — Telephone Encounter (Signed)
I called patient for further info. She states that she is still feeling congested in her chest. She also states that she finds it hard to lay down because she starts coughing/ choking. Please advise and thank you.

## 2018-01-27 ENCOUNTER — Encounter: Payer: Self-pay | Admitting: Family Medicine

## 2018-01-27 ENCOUNTER — Ambulatory Visit: Payer: BC Managed Care – PPO | Admitting: Family Medicine

## 2018-01-27 VITALS — BP 140/90 | HR 91 | Temp 98.8°F | Resp 16 | Wt 160.6 lb

## 2018-01-27 DIAGNOSIS — N912 Amenorrhea, unspecified: Secondary | ICD-10-CM | POA: Diagnosis not present

## 2018-01-27 DIAGNOSIS — R5383 Other fatigue: Secondary | ICD-10-CM

## 2018-01-27 DIAGNOSIS — R05 Cough: Secondary | ICD-10-CM

## 2018-01-27 DIAGNOSIS — R030 Elevated blood-pressure reading, without diagnosis of hypertension: Secondary | ICD-10-CM | POA: Diagnosis not present

## 2018-01-27 DIAGNOSIS — R0602 Shortness of breath: Secondary | ICD-10-CM | POA: Diagnosis not present

## 2018-01-27 DIAGNOSIS — R058 Other specified cough: Secondary | ICD-10-CM

## 2018-01-27 LAB — POCT URINE PREGNANCY: PREG TEST UR: NEGATIVE

## 2018-01-27 MED ORDER — AMOXICILLIN-POT CLAVULANATE 875-125 MG PO TABS: 1.0000 | ORAL_TABLET | Freq: Two times a day (BID) | ORAL | 0 refills | Status: DC

## 2018-01-27 NOTE — Progress Notes (Signed)
Chief Complaint  Patient presents with  . sick    voice is gone, cough, sometimes out of breath, fatigue, so allergist last week and got better but still there    Subjective:  Jacqueline Holmes is a 47 y.o. female who presents for a 2 week history of fatigue, cough and hoarseness. Reports feeling mild shortness of breath with cough.  She has an underlying history of asthma and allergies and has been seeing her allergist on a regular basis. At her allergist visit last week they started her on Qvar and Symbicort. She also took a course of steroids. States she thinks she had the flu 2 weeks ago and then symptoms almost cleared up for a day or two.  Does not smoke. Reports history of pneumonia, asthma and bronchitis in the past.   Denies fever, chills, body aches, dizziness, rhinorrhea, nasal congestion, ear pain, sore throat, chest pain, palpitations, orthopnea, abdominal pain, N/V/D, LE edema.   States she thinks she needs an antibiotic and the Z-pak does not work for her.   Denies recent surgery, immobilization, leg or calf pain, history of DVT.   Denies history of HTN. States her BP has been elevated recently.   Denies sick contacts.  No other aggravating or relieving factors.  No other c/o.  No period in several months. States she "is pretty sure" she is not pregnant.   ROS as in subjective.   Objective: Vitals:   01/27/18 1616  BP: 140/90  Pulse: 91  Resp: 16  Temp: 98.8 F (37.1 C)  SpO2: 97%    General appearance: Alert, WD/WN, no distress, mildly ill appearing                             Skin: warm, no rash                           Head: no sinus tenderness                            Eyes: conjunctiva normal, corneas clear, PERRLA                            Ears: pearly TMs, external ear canals normal                          Nose: septum midline, turbinates swollen, with erythema and clear discharge             Mouth/throat: MMM, tongue normal, mild pharyngeal erythema                          Neck: supple, no adenopathy, no thyromegaly, nontender                          Heart: RRR, normal S1, S2, no murmurs                         Lungs: CTA bilaterally, no wheezes, rales, or rhonchi   Ext: no LE edema, no asymmetry noted, non tender calves, normal pulses, negative Homan's       Assessment: Cough present for greater than 3 weeks - Plan: DG Chest 2 View, amoxicillin-clavulanate (AUGMENTIN) 875-125 MG tablet  Shortness of breath - Plan: DG Chest 2 View  Fatigue, unspecified type - Plan: CBC with Differential/Platelet, Comprehensive metabolic panel  Elevated blood pressure reading without diagnosis of hypertension  Amenorrhea - Plan: POCT urine pregnancy   Plan: UPT done due to unknown pregnancy status and XR ordered.  Discussed possible etiologies for fatigue and lingering cough. She is concerned she could have pneumonia so we will get a chest XR.  CBC, CMP ordered as well. She reports recent TSH was normal. Very unlikely she has a PE but this was considered. Augmentin prescribed. Recommend Mucinex and continue with allergy and asthma treatment. Tylenol or Ibuprofen OTC for fever and malaise.  Call/return if worsening or not back to baseline after completing the antibiotic.    Discussed that her BP is elevated and recommend lifestyle changes and keeping an eye on her BP. Advised that she should follow up with her PCP, Dr. Tomi Bamberger soon regarding possible new HTN.  DASH diet handout given.

## 2018-01-27 NOTE — Patient Instructions (Addendum)
Take the antibiotic as prescribed.  You can go for your chest x-ray tomorrow. Try salt water gargles for your throat.  You can try Mucinex for cough and congestion.  Your blood pressure is elevated today 140/90.  I recommend that you follow-up with your primary care, Dr. Tomi Bamberger, to discuss this.  We will call you with your lab and XR results.   DASH Eating Plan DASH stands for "Dietary Approaches to Stop Hypertension." The DASH eating plan is a healthy eating plan that has been shown to reduce high blood pressure (hypertension). It may also reduce your risk for type 2 diabetes, heart disease, and stroke. The DASH eating plan may also help with weight loss. What are tips for following this plan? General guidelines  Avoid eating more than 2,300 mg (milligrams) of salt (sodium) a day. If you have hypertension, you may need to reduce your sodium intake to 1,500 mg a day.  Limit alcohol intake to no more than 1 drink a day for nonpregnant women and 2 drinks a day for men. One drink equals 12 oz of beer, 5 oz of wine, or 1 oz of hard liquor.  Work with your health care provider to maintain a healthy body weight or to lose weight. Ask what an ideal weight is for you.  Get at least 30 minutes of exercise that causes your heart to beat faster (aerobic exercise) most days of the week. Activities may include walking, swimming, or biking.  Work with your health care provider or diet and nutrition specialist (dietitian) to adjust your eating plan to your individual calorie needs. Reading food labels  Check food labels for the amount of sodium per serving. Choose foods with less than 5 percent of the Daily Value of sodium. Generally, foods with less than 300 mg of sodium per serving fit into this eating plan.  To find whole grains, look for the word "whole" as the first word in the ingredient list. Shopping  Buy products labeled as "low-sodium" or "no salt added."  Buy fresh foods. Avoid canned  foods and premade or frozen meals. Cooking  Avoid adding salt when cooking. Use salt-free seasonings or herbs instead of table salt or sea salt. Check with your health care provider or pharmacist before using salt substitutes.  Do not fry foods. Cook foods using healthy methods such as baking, boiling, grilling, and broiling instead.  Cook with heart-healthy oils, such as olive, canola, soybean, or sunflower oil. Meal planning   Eat a balanced diet that includes: ? 5 or more servings of fruits and vegetables each day. At each meal, try to fill half of your plate with fruits and vegetables. ? Up to 6-8 servings of whole grains each day. ? Less than 6 oz of lean meat, poultry, or fish each day. A 3-oz serving of meat is about the same size as a deck of cards. One egg equals 1 oz. ? 2 servings of low-fat dairy each day. ? A serving of nuts, seeds, or beans 5 times each week. ? Heart-healthy fats. Healthy fats called Omega-3 fatty acids are found in foods such as flaxseeds and coldwater fish, like sardines, salmon, and mackerel.  Limit how much you eat of the following: ? Canned or prepackaged foods. ? Food that is high in trans fat, such as fried foods. ? Food that is high in saturated fat, such as fatty meat. ? Sweets, desserts, sugary drinks, and other foods with added sugar. ? Full-fat dairy products.  Do not  salt foods before eating.  Try to eat at least 2 vegetarian meals each week.  Eat more home-cooked food and less restaurant, buffet, and fast food.  When eating at a restaurant, ask that your food be prepared with less salt or no salt, if possible. What foods are recommended? The items listed may not be a complete list. Talk with your dietitian about what dietary choices are best for you. Grains Whole-grain or whole-wheat bread. Whole-grain or whole-wheat pasta. Brown rice. [place]. Bulgur. Whole-grain and low-sodium cereals. Pita bread. Low-fat, low-sodium crackers.  Whole-wheat flour tortillas. Vegetables Fresh or frozen vegetables (raw, steamed, roasted, or grilled). Low-sodium or reduced-sodium tomato and vegetable juice. Low-sodium or reduced-sodium tomato sauce and tomato paste. Low-sodium or reduced-sodium canned vegetables. Fruits All fresh, dried, or frozen fruit. Canned fruit in natural juice (without added sugar). Meat and other protein foods Skinless chicken or [place]. Ground chicken or [place]. Pork with fat trimmed off. Fish and seafood. Egg whites. Dried beans, peas, or lentils. Unsalted nuts, nut butters, and seeds. Unsalted canned beans. Lean cuts of beef with fat trimmed off. Low-sodium, lean deli meat. Dairy Low-fat (1%) or fat-free (skim) milk. Fat-free, low-fat, or reduced-fat cheeses. Nonfat, low-sodium ricotta or cottage cheese. Low-fat or nonfat yogurt. Low-fat, low-sodium cheese. Fats and oils Soft margarine without trans fats. Vegetable oil. Low-fat, reduced-fat, or light mayonnaise and salad dressings (reduced-sodium). Canola, safflower, olive, soybean, and sunflower oils. Avocado. Seasoning and other foods Herbs. Spices. Seasoning mixes without salt. Unsalted popcorn and pretzels. Fat-free sweets. What foods are not recommended? The items listed may not be a complete list. Talk with your dietitian about what dietary choices are best for you. Grains Baked goods made with fat, such as croissants, muffins, or some breads. Dry pasta or rice meal packs. Vegetables Creamed or fried vegetables. Vegetables in a cheese sauce. Regular canned vegetables (not low-sodium or reduced-sodium). Regular canned tomato sauce and paste (not low-sodium or reduced-sodium). Regular tomato and vegetable juice (not low-sodium or reduced-sodium). Angie Fava. Olives. Fruits Canned fruit in a light or heavy syrup. Fried fruit. Fruit in cream or butter sauce. Meat and other protein foods Fatty cuts of meat. Ribs. Fried meat. Berniece Salines. Sausage. Bologna and other  processed lunch meats. Salami. Fatback. Hotdogs. Bratwurst. Salted nuts and seeds. Canned beans with added salt. Canned or smoked fish. Whole eggs or egg yolks. Chicken or [place] with skin. Dairy Whole or 2% milk, cream, and half-and-half. Whole or full-fat cream cheese. Whole-fat or sweetened yogurt. Full-fat cheese. Nondairy creamers. Whipped toppings. Processed cheese and cheese spreads. Fats and oils Butter. Stick margarine. Lard. Shortening. Ghee. Bacon fat. Tropical oils, such as coconut, palm kernel, or palm oil. Seasoning and other foods Salted popcorn and pretzels. Onion salt, garlic salt, seasoned salt, table salt, and sea salt. Worcestershire sauce. Tartar sauce. Barbecue sauce. Teriyaki sauce. Soy sauce, including reduced-sodium. Steak sauce. Canned and packaged gravies. Fish sauce. Oyster sauce. Cocktail sauce. Horseradish that you find on the shelf. Ketchup. Mustard. Meat flavorings and tenderizers. Bouillon cubes. Hot sauce and Tabasco sauce. Premade or packaged marinades. Premade or packaged taco seasonings. Relishes. Regular salad dressings. Where to find more information:  National Heart, Lung, and [place]: https://wilson-eaton.com/  American Heart Association: www.heart.org Summary  The DASH eating plan is a healthy eating plan that has been shown to reduce high blood pressure (hypertension). It may also reduce your risk for type 2 diabetes, heart disease, and stroke.  With the DASH eating plan, you should limit salt (sodium) intake to  2,300 mg a day. If you have hypertension, you may need to reduce your sodium intake to 1,500 mg a day.  When on the DASH eating plan, aim to eat more fresh fruits and vegetables, whole grains, lean proteins, low-fat dairy, and heart-healthy fats.  Work with your health care provider or diet and nutrition specialist (dietitian) to adjust your eating plan to your individual calorie needs. This information is not intended to replace advice given to  you by your health care provider. Make sure you discuss any questions you have with your health care provider. Document Released: 11/05/2011 Document Revised: 11/09/2016 Document Reviewed: 11/09/2016 Elsevier Interactive Patient Education  [employer].

## 2018-01-28 ENCOUNTER — Encounter: Payer: Self-pay | Admitting: Family Medicine

## 2018-01-28 ENCOUNTER — Ambulatory Visit
Admission: RE | Admit: 2018-01-28 | Discharge: 2018-01-28 | Disposition: A | Discharge status: Home / Self Care | Payer: BC Managed Care – PPO | Source: Ambulatory Visit | Attending: Family Medicine | Admitting: Family Medicine

## 2018-01-28 DIAGNOSIS — R058 Other specified cough: Secondary | ICD-10-CM

## 2018-01-28 DIAGNOSIS — R0602 Shortness of breath: Secondary | ICD-10-CM

## 2018-01-28 DIAGNOSIS — R05 Cough: Secondary | ICD-10-CM

## 2018-01-28 DIAGNOSIS — R740 Nonspecific elevation of levels of transaminase and lactic acid dehydrogenase [LDH]: Secondary | ICD-10-CM

## 2018-01-28 DIAGNOSIS — R7401 Elevation of levels of liver transaminase levels: Secondary | ICD-10-CM | POA: Insufficient documentation

## 2018-01-28 LAB — CBC WITH DIFFERENTIAL/PLATELET
BASOS ABS: 0 10*3/uL (ref 0.0–0.2)
Basos: 0 %
EOS (ABSOLUTE): 0.1 10*3/uL (ref 0.0–0.4)
Eos: 1 %
HEMOGLOBIN: 14.8 g/dL (ref 11.1–15.9)
Hematocrit: 42.5 % (ref 34.0–46.6)
IMMATURE GRANS (ABS): 0 10*3/uL (ref 0.0–0.1)
IMMATURE GRANULOCYTES: 0 %
LYMPHS: 39 %
Lymphocytes Absolute: 4.5 10*3/uL — ABNORMAL HIGH (ref 0.7–3.1)
MCH: 28.7 pg (ref 26.6–33.0)
MCHC: 34.8 g/dL (ref 31.5–35.7)
MCV: 82 fL (ref 79–97)
Monocytes Absolute: 1.2 10*3/uL — ABNORMAL HIGH (ref 0.1–0.9)
Monocytes: 11 %
NEUTROS PCT: 49 %
Neutrophils Absolute: 5.7 10*3/uL (ref 1.4–7.0)
PLATELETS: 372 10*3/uL (ref 150–379)
RBC: 5.16 x10E6/uL (ref 3.77–5.28)
RDW: 13.4 % (ref 12.3–15.4)
WBC: 11.6 10*3/uL — ABNORMAL HIGH (ref 3.4–10.8)

## 2018-01-28 LAB — COMPREHENSIVE METABOLIC PANEL
ALK PHOS: 91 IU/L (ref 39–117)
ALT: 35 IU/L — ABNORMAL HIGH (ref 0–32)
AST: 15 IU/L (ref 0–40)
Albumin/Globulin Ratio: 1.3 (ref 1.2–2.2)
Albumin: 4 g/dL (ref 3.5–5.5)
BUN/Creatinine Ratio: 29 — ABNORMAL HIGH (ref 9–23)
BUN: 21 mg/dL (ref 6–24)
Bilirubin Total: 0.2 mg/dL (ref 0.0–1.2)
CALCIUM: 9.3 mg/dL (ref 8.7–10.2)
CO2: 24 mmol/L (ref 20–29)
CREATININE: 0.73 mg/dL (ref 0.57–1.00)
Chloride: 102 mmol/L (ref 96–106)
GFR calc Af Amer: 113 mL/min/{1.73_m2} (ref >59)
GFR calc non Af Amer: 98 mL/min/{1.73_m2} (ref >59)
GLUCOSE: 91 mg/dL (ref 65–99)
Globulin, Total: 3.2 g/dL (ref 1.5–4.5)
Potassium: 5.3 mmol/L — ABNORMAL HIGH (ref 3.5–5.2)
Sodium: 140 mmol/L (ref 134–144)
Total Protein: 7.2 g/dL (ref 6.0–8.5)

## 2018-01-28 IMAGING — CR DG CHEST 2V
2 series · 2 of 2 positions shown · non-contrast
Comparison: Chest radiograph [DATE]

CLINICAL DATA: Productive cough, short of breath, fatigue

EXAM:
CHEST  2 VIEW

[w chest pa]
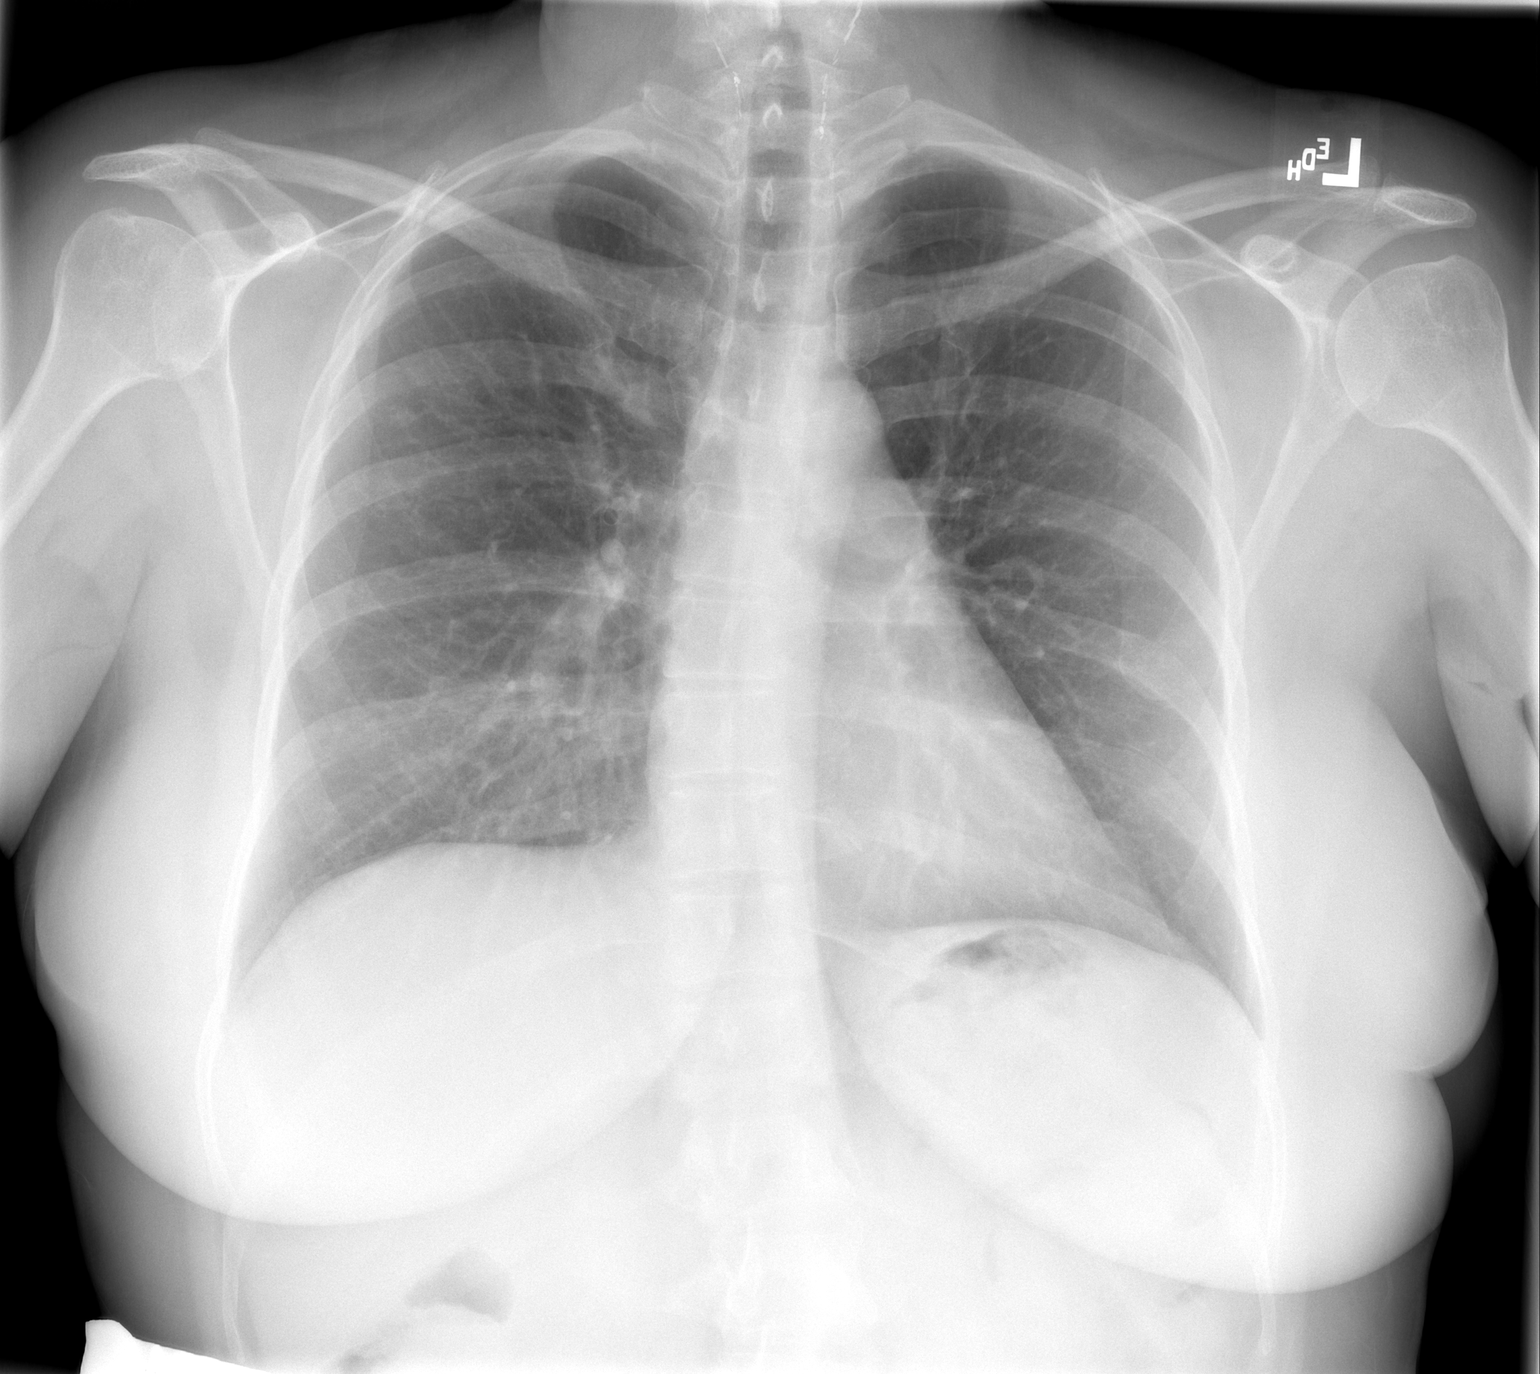

[w chest lat]
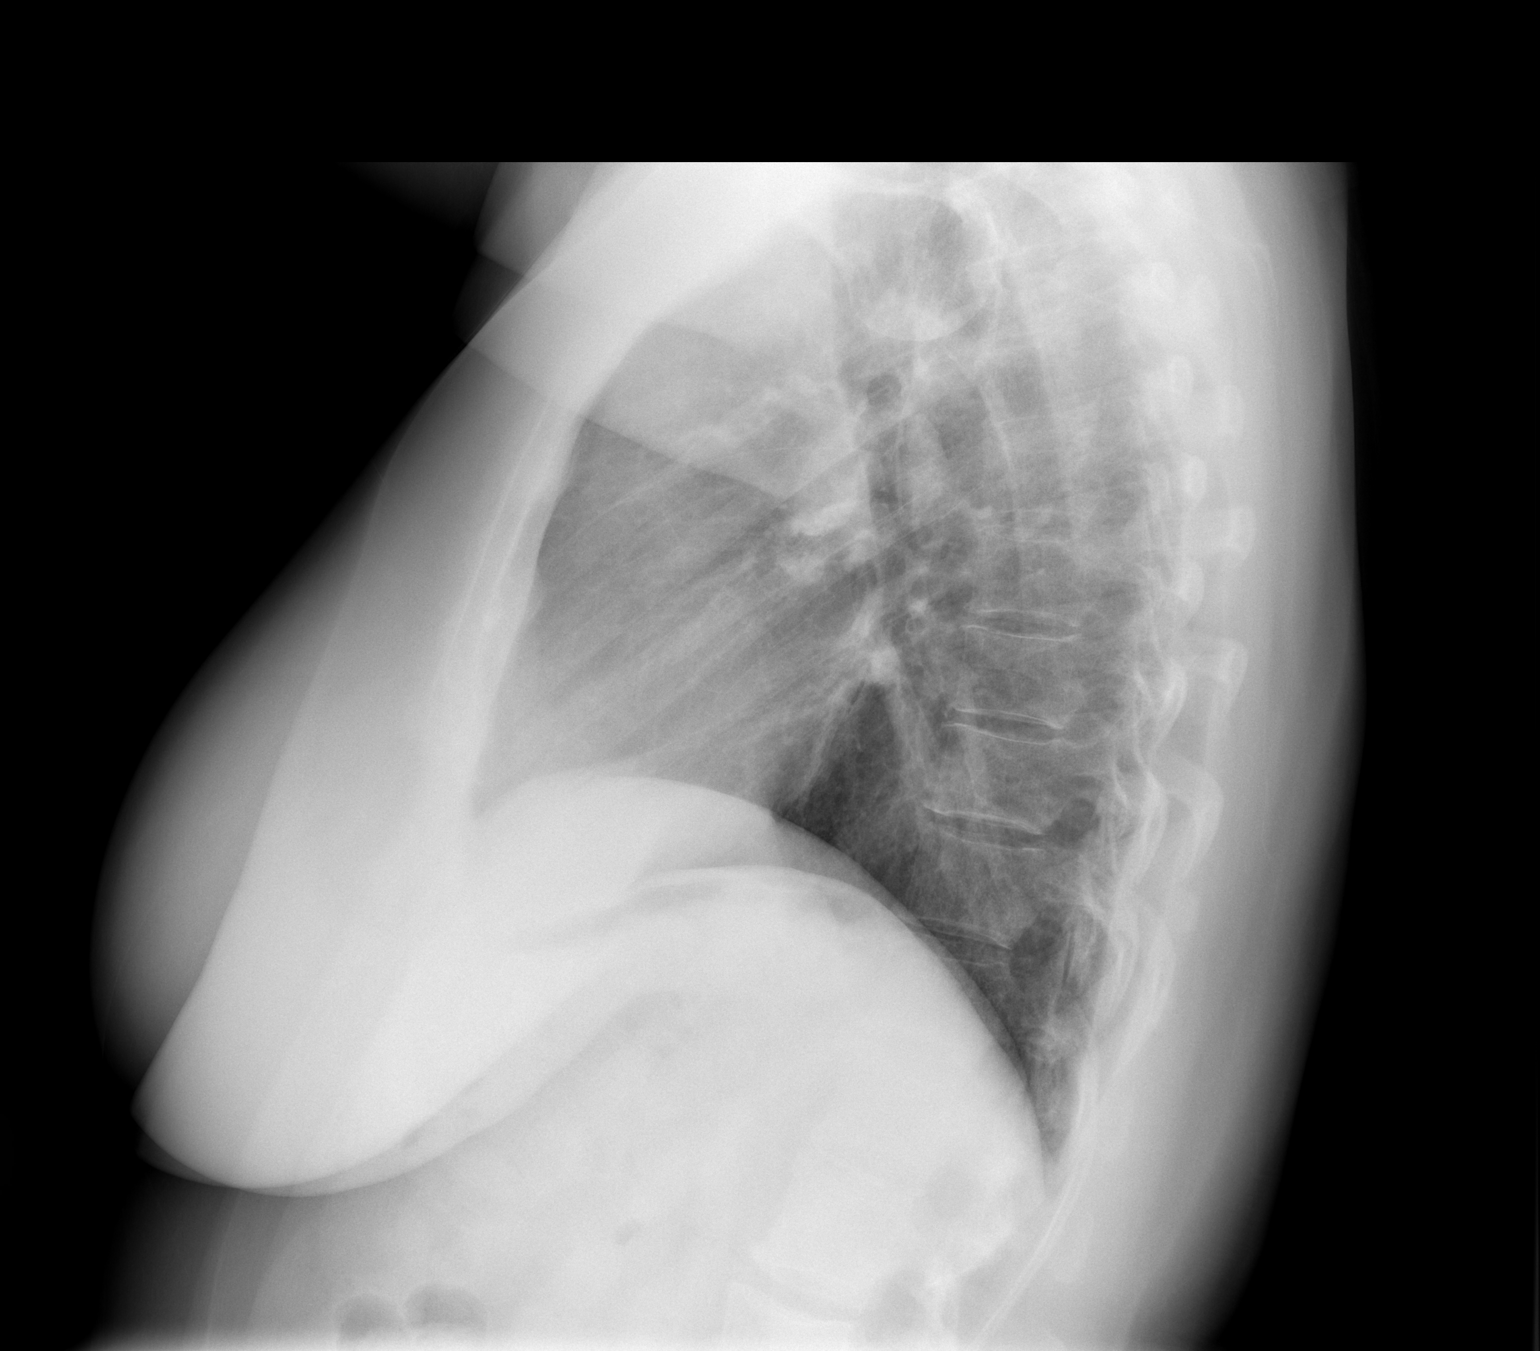

[2 of 2 positions shown; findings below may reference images not displayed]

FINDINGS: Normal mediastinum and cardiac silhouette. Normal pulmonary
vasculature. No evidence of effusion, infiltrate, or pneumothorax.
No acute bony abnormality.
IMPRESSION: Normal chest radiograph

## 2018-02-01 LAB — SPECIMEN STATUS REPORT

## 2018-02-01 LAB — HEPATITIS PANEL, ACUTE
Hep A IgM: NEGATIVE
Hep B C IgM: NEGATIVE
Hep C Virus Ab: <0.1 s/co ratio (ref 0.0–0.9)
Hepatitis B Surface Ag: NEGATIVE

## 2018-02-02 ENCOUNTER — Ambulatory Visit (INDEPENDENT_AMBULATORY_CARE_PROVIDER_SITE_OTHER): Payer: BC Managed Care – PPO | Admitting: *Deleted

## 2018-02-02 DIAGNOSIS — J309 Allergic rhinitis, unspecified: Secondary | ICD-10-CM

## 2018-02-05 NOTE — Progress Notes (Signed)
Chief Complaint  Patient presents with  . Hypertension    follow up on bp.    Patient saw Vickie on 2/28 with complaint of cough. Her blood pressure was noted to be mildly elevated at that time.  She was instructed on low sodium diet, and presents today to f/u on blood pressure. She hasn't seen me since 10/2016. She is on immunotherapy and sees allergist regularly.  Cough is better.  She hasn't checked blood pressure elsewhere.  Used some decongestants when she first got sick, none recently.  She already tries to follow a low sodium diet, so didn't really make any changes since last visit.  Eats some potatoes/fries when eating out, some nuts (not usually salted). Her exercise routine is walking, but hasn't much in the last few weeks due to not feeling well.  When the weather is nice, she walks at the park (3-5x/week).  She has been diagnosed with sleep apnea in the distant past (15 years ago); recalls it was mild and didn't need treatment.  +snoring.  Husband hasn't mentioned apnea.  Doesn't feel refreshed in the mornings, even though she gets plenty of sleep.  With her recent sick visit, she had CXR, CBC and chem done, and was treated with Augmentin.  CBC noted elevated WBC, CXR was normal.  Chem panel showed mildly elevated K+ (5.3) and mildly elevated ALT. Lab Results  Component Value Date   WBC 11.6 (H) 01/27/2018   HGB 14.8 01/27/2018   HCT 42.5 01/27/2018   MCV 82 01/27/2018   PLT 372 01/27/2018     Chemistry      Component Value Date/Time   NA 140 01/27/2018 1705   K 5.3 (H) 01/27/2018 1705   CL 102 01/27/2018 1705   CO2 24 01/27/2018 1705   BUN 21 01/27/2018 1705   CREATININE 0.73 01/27/2018 1705      Component Value Date/Time   CALCIUM 9.3 01/27/2018 1705   ALKPHOS 91 01/27/2018 1705   AST 15 01/27/2018 1705   ALT 35 (H) 01/27/2018 1705   BILITOT 0.2 01/27/2018 1705      She also  Reports some periodic pain in her LLQ, went to GYN and was told her ovaries are  fine.  It comes/goes. Sometimes pain is sharp/stabbing, short-lived.  Not currently having pain, just a slight "pressure". Last BM yesterday, normal.  Reports intermittent constipation.  PMH, PSH, SH reviewed  Outpatient Encounter Medications as of 02/07/2018  Medication Sig Note  . Azelastine-Fluticasone (DYMISTA) 137-50 MCG/ACT SUSP Place 2 sprays into the nose 2 (two) times daily.   . beclomethasone (QVAR REDIHALER) 80 MCG/ACT inhaler Inhale 2 puffs into the lungs 2 (two) times daily.   . calcium-vitamin D (OSCAL WITH D) 500-200 MG-UNIT per tablet Take 1 tablet by mouth daily with breakfast.   . cholecalciferol (VITAMIN D) 1000 UNITS tablet Take 5,000 Units by mouth daily.   Marland Kitchen Fexofenadine HCl (ALLEGRA PO) Take by mouth.   . levocetirizine (XYZAL) 5 MG tablet Take 1 tablet (5 mg total) by mouth every evening.   . metFORMIN (GLUCOPHAGE) 500 MG tablet Take 1,000 mg by mouth daily with breakfast.  04/15/2016: Takes extended release 500mg , 2 tablets every morning  . NATURE-THROID 97.5 MG TABS    . NONFORMULARY OR COMPOUNDED ITEM [facility]: Onychomycosis Nail Lacquer - Fluconazole 2%, Terbinafine 1%, +Urea as maximum amount for this compound   . progesterone (PROMETRIUM) 200 MG capsule    . QVAR REDIHALER 80 MCG/ACT inhaler Inhale 2 puffs into  the lungs 2 (two) times daily.   Marland Kitchen Spacer/Aero-Holding Chambers [facility] DIAMOND) DEVI Use as directed with inhaler. Dx:  J45.40 Asthma   . acetaminophen (TYLENOL) 500 MG tablet Take 1,000 mg by mouth every 6 (six) hours as needed for headache. Reported on 06/17/2016 05/02/2014: Prn   . budesonide-formoterol (SYMBICORT) 160-4.5 MCG/ACT inhaler Two puffs with spacer device twice a day during asthma flares.  Rinse, gargle and spit after use. (Patient not taking: Reported on 02/07/2018)   . ibuprofen (ADVIL,MOTRIN) 200 MG tablet Take 600-800 mg by mouth every 6 (six) hours as needed.   . Olopatadine HCl (PAZEO) 0.7 % SOLN Place 1 drop into both eyes 1  day or 1 dose. (Patient not taking: Reported on 02/07/2018)   . PROAIR HFA 108 (90 Base) MCG/ACT inhaler Inhale 2 puffs into the lungs every 4 (four) hours as needed. (Patient not taking: Reported on 02/07/2018)   . [DISCONTINUED] amoxicillin-clavulanate (AUGMENTIN) 875-125 MG tablet Take 1 tablet by mouth 2 (two) times daily. (Patient not taking: Reported on 02/07/2018)    No facility-administered encounter medications on file as of 02/07/2018.    No Known Allergies  ROS: no fever, chills, URI symptoms and cough resolved.  No headaches, dizziness, chest pain, shortness of breath, edema, nausea, vomiting, diarrhea, blood in stool. Some LLQ discomfort and intermittent constipation as per HPI.  No urinary complaints.   PHYSICAL EXAM:  BP 132/90 (BP Location: Right Arm, Cuff Size: Normal)   Pulse 88   Ht 5' 1.5" (1.562 m)   Wt 164 lb 3.2 oz (74.5 kg)   BMI 30.52 kg/m   128/80 on repeat by MD  BP Readings from Last 3 Encounters:  01/27/18 140/90  01/18/18 (!) 140/94  07/08/17 104/60   Well-appearing, pleasant, obese female in no distress HEENT: conjunctiva and sclera are clear. OP clear Neck: no lymphadenopathy, thyromegaly or mass Heart: regular rate and rhythm Lungs: clear bilaterally Abdomen: soft, mildly tender in LLQ, no mass, no rebound, normal bowel sounds. No organomegaly Extremities: no edema Neuro: alert and oriented, cranial nerves intact, normal gait Psych: normal mood, affect, hygiene and grooming   ASSESSMENT/PLAN:  Elevated blood pressure reading - improved on recheck; continue low Na diet, encouraged regular exercise and wt loss. Check BP periodically elsewhere  LLQ pain - Ddx reviewed, suspect related to gas, constipation. High fiber diet. f/u if worsening pain, fever, blood in stool  Unrefreshed by sleep - h/o mild sleep apnea. +weight gain since then. recheck sleep study. Risks of untreated OSA reviewed. - Plan: Home sleep test  Snoring - Plan: Home sleep  test  Hemoccult cards given. Schedule CPE. F/u sooner if BP's consistently running high, or worsening LLQ pain    Continue low sodium diet. Try and exercise at least 30 minutes daily. Weight loss is recommended. We are going to refer you for another sleep study.  Sleep apnea has a lot of potential health consequences, if not treated, including affecting your blood pressure. Periodically check your blood pressure elsewhere and record. Bring your list to follow up appointment. Return sooner than a physical if your blood pressure is consistently running >135-140/85-90.  Be sure to drink plenty of water, high fiber diet and regular exercise to help prevent constipation. Return the stool cards--this looks for microscopic blood in the stool. If present, we will send you for colonoscopy sooner than age 27.

## 2018-02-07 ENCOUNTER — Encounter: Payer: Self-pay | Admitting: Family Medicine

## 2018-02-07 ENCOUNTER — Ambulatory Visit: Payer: BC Managed Care – PPO | Admitting: Family Medicine

## 2018-02-07 VITALS — BP 128/80 | HR 88 | Ht 61.5 in | Wt 164.2 lb

## 2018-02-07 DIAGNOSIS — R1032 Left lower quadrant pain: Secondary | ICD-10-CM

## 2018-02-07 DIAGNOSIS — G478 Other sleep disorders: Secondary | ICD-10-CM | POA: Diagnosis not present

## 2018-02-07 DIAGNOSIS — R030 Elevated blood-pressure reading, without diagnosis of hypertension: Secondary | ICD-10-CM | POA: Diagnosis not present

## 2018-02-07 DIAGNOSIS — R0683 Snoring: Secondary | ICD-10-CM

## 2018-02-07 NOTE — Patient Instructions (Addendum)
  Continue low sodium diet. Try and exercise at least 30 minutes daily. Weight loss is recommended. We are going to refer you for another sleep study.  Sleep apnea has a lot of potential health consequences, if not treated, including affecting your blood pressure. Periodically check your blood pressure elsewhere and record. Bring your list to follow up appointment. Return sooner than a physical if your blood pressure is consistently running >135-140/85-90.   Be sure to drink plenty of water, high fiber diet and regular exercise to help prevent constipation. Return the stool cards--this looks for microscopic blood in the stool. If present, we will send you for colonoscopy sooner than age 25.

## 2018-02-11 ENCOUNTER — Ambulatory Visit (INDEPENDENT_AMBULATORY_CARE_PROVIDER_SITE_OTHER): Payer: BC Managed Care – PPO

## 2018-02-11 DIAGNOSIS — J309 Allergic rhinitis, unspecified: Secondary | ICD-10-CM | POA: Diagnosis not present

## 2018-02-21 ENCOUNTER — Other Ambulatory Visit (INDEPENDENT_AMBULATORY_CARE_PROVIDER_SITE_OTHER): Payer: BC Managed Care – PPO

## 2018-02-21 DIAGNOSIS — Z1211 Encounter for screening for malignant neoplasm of colon: Secondary | ICD-10-CM | POA: Diagnosis not present

## 2018-02-21 LAB — HEMOCCULT GUIAC POC 1CARD (OFFICE)
Card #3 Fecal Occult Blood, POC: NEGATIVE
FECAL OCCULT BLD: NEGATIVE
Fecal Occult Blood, POC: NEGATIVE

## 2018-02-25 ENCOUNTER — Ambulatory Visit (INDEPENDENT_AMBULATORY_CARE_PROVIDER_SITE_OTHER): Payer: BC Managed Care – PPO

## 2018-02-25 DIAGNOSIS — J309 Allergic rhinitis, unspecified: Secondary | ICD-10-CM

## 2018-03-04 ENCOUNTER — Ambulatory Visit (INDEPENDENT_AMBULATORY_CARE_PROVIDER_SITE_OTHER): Payer: BC Managed Care – PPO

## 2018-03-04 DIAGNOSIS — J309 Allergic rhinitis, unspecified: Secondary | ICD-10-CM | POA: Diagnosis not present

## 2018-03-10 ENCOUNTER — Ambulatory Visit (HOSPITAL_BASED_OUTPATIENT_CLINIC_OR_DEPARTMENT_OTHER): Payer: BC Managed Care – PPO

## 2018-03-11 ENCOUNTER — Ambulatory Visit (INDEPENDENT_AMBULATORY_CARE_PROVIDER_SITE_OTHER): Payer: BC Managed Care – PPO | Admitting: *Deleted

## 2018-03-11 DIAGNOSIS — J309 Allergic rhinitis, unspecified: Secondary | ICD-10-CM

## 2018-03-14 ENCOUNTER — Ambulatory Visit (HOSPITAL_BASED_OUTPATIENT_CLINIC_OR_DEPARTMENT_OTHER): Payer: BC Managed Care – PPO

## 2018-03-15 ENCOUNTER — Ambulatory Visit (HOSPITAL_BASED_OUTPATIENT_CLINIC_OR_DEPARTMENT_OTHER): Payer: BC Managed Care – PPO | Attending: Family Medicine | Admitting: Internal Medicine

## 2018-03-15 VITALS — Ht 61.0 in | Wt 161.0 lb

## 2018-03-15 DIAGNOSIS — R0683 Snoring: Secondary | ICD-10-CM

## 2018-03-17 ENCOUNTER — Ambulatory Visit (INDEPENDENT_AMBULATORY_CARE_PROVIDER_SITE_OTHER): Payer: BC Managed Care – PPO | Admitting: *Deleted

## 2018-03-17 DIAGNOSIS — J309 Allergic rhinitis, unspecified: Secondary | ICD-10-CM | POA: Diagnosis not present

## 2018-03-21 DIAGNOSIS — G4733 Obstructive sleep apnea (adult) (pediatric): Secondary | ICD-10-CM

## 2018-03-21 NOTE — Procedures (Signed)
    Patient Name: Jacqueline Holmes, Jacqueline Holmes Date: 03/15/2018 Gender: Female D.O.B: February 15, 1971 Age (years): 46 Referring Provider: [name] Height (inches): 61 Interpreting Physician: Baird Lyons MD, ABSM Weight (lbs): 161 RPSGT: Jacolyn Reedy BMI: 30 MRN: [id number] Neck Size: 15.00  CLINICAL INFORMATION Sleep Study Type: HST Indication for sleep study: OSA  Epworth Sleepiness Score: 3  SLEEP STUDY TECHNIQUE A multi-channel overnight portable sleep study was performed. The channels recorded were: nasal airflow, thoracic respiratory movement, and oxygen saturation with a pulse oximetry. Snoring was also monitored.  MEDICATIONS Patient self administered medications include: none reported.  SLEEP ARCHITECTURE Patient was studied for 442.4 minutes. The sleep efficiency was 87.6 % and the patient was supine for 9.6%. The arousal index was 0.0 per hour.  RESPIRATORY PARAMETERS The overall AHI was 2.6 per hour, with a central apnea index of 0.3 per hour.  The oxygen nadir was 90% during sleep.  CARDIAC DATA Mean heart rate during sleep was 82.7 bpm.  IMPRESSIONS - No significant obstructive sleep apnea occurred during this study (AHI = 2.6/h). - No significant central sleep apnea occurred during this study (CAI = 0.3/h). - The patient had minimal or no oxygen desaturation during the study (Min O2 = 90%) - Patient snored.  DIAGNOSIS - Normal study - Primary snoring  RECOMMENDATIONS - Positional therapy avoiding supine position during sleep. - Be careful with alcohol, sedatives and other CNS depressants that may worsen sleep apnea and disrupt normal sleep architecture. - Sleep hygiene should be reviewed to assess factors that may improve sleep quality. - Weight management and regular exercise should be initiated or continued.  [Electronically signed] 03/21/2018 04:40 PM  Baird Lyons MD, [facility], American Board of Sleep Medicine   NPI: [id number]                          [place], [place] [facility]  ELECTRONICALLY SIGNED ON:  03/21/2018, 4:39 PM [place] PH: [phone]   FX: [phone] [place]

## 2018-03-25 ENCOUNTER — Ambulatory Visit (INDEPENDENT_AMBULATORY_CARE_PROVIDER_SITE_OTHER): Payer: BC Managed Care – PPO

## 2018-03-25 DIAGNOSIS — J309 Allergic rhinitis, unspecified: Secondary | ICD-10-CM

## 2018-04-11 ENCOUNTER — Encounter: Payer: Self-pay | Admitting: Family Medicine

## 2018-04-11 ENCOUNTER — Ambulatory Visit: Payer: BC Managed Care – PPO | Admitting: Family Medicine

## 2018-04-11 VITALS — BP 130/74 | HR 88 | Temp 100.0°F | Ht 61.5 in | Wt 167.2 lb

## 2018-04-11 DIAGNOSIS — J0100 Acute maxillary sinusitis, unspecified: Secondary | ICD-10-CM | POA: Diagnosis not present

## 2018-04-11 MED ORDER — AMOXICILLIN-POT CLAVULANATE 875-125 MG PO TABS: 1.0000 | ORAL_TABLET | Freq: Two times a day (BID) | ORAL | 0 refills | Status: DC

## 2018-04-11 NOTE — Progress Notes (Signed)
Chief Complaint  Patient presents with  . Facial Pain    lots of drainage and congestion. Started about a week ago and has progressively gotten worse. May have started before that but did come home about a week ago and was running a fever of about 101. Mucus is yellow in color. When she lays down it is worse and she starts coughing.    Has had cold symptoms and fever for about a week.  101 last Monday.  She has had bad seasonal allergies in the past, but this year is on allergy shots, and had a round of steroids (and antibiotics) in February. Doesn't recall allergies being bad prior to the onset of fever and congestion last week.  Nasal drainage is yellow-ish green, thick, occasional streaks of blood. Getting darker.  Pain on either side of her nose, into her cheeks. Last night felt popping on the right side of her nose, and some left ear discomfort.  No frontal headaches.  Some PND, sore throat.  +cough, worse when she lays down.  Nonproductive.  Denies feeling tight/wheezy, just today is slightly tighter, like things are moving into her chest. Has used her rescue inhaler once or twice a day recently, with this illness. She started on symbicort when illness started last week.  Denies sick contacts.  Uses ibuprofen as needed for fever, last dose this morning. Just started using Mucinex a few days ago. Continues on her regular allergy regimen (dymista, allegra, xyzal) Uses symbicort only prn (rx'd in Feb), with respiratory. Started back on it last week, about 7-8 days ago.   Outpatient Encounter Medications as of 04/11/2018  Medication Sig Note  . Azelastine-Fluticasone (DYMISTA) 137-50 MCG/ACT SUSP Place 2 sprays into the nose 2 (two) times daily.   . beclomethasone (QVAR REDIHALER) 80 MCG/ACT inhaler Inhale 2 puffs into the lungs 2 (two) times daily.   . budesonide-formoterol (SYMBICORT) 160-4.5 MCG/ACT inhaler Two puffs with spacer device twice a day during asthma flares.  Rinse, gargle and  spit after use.   . calcium-vitamin D (OSCAL WITH D) 500-200 MG-UNIT per tablet Take 1 tablet by mouth daily with breakfast.   . cholecalciferol (VITAMIN D) 1000 UNITS tablet Take 5,000 Units by mouth daily.   Marland Kitchen Fexofenadine HCl (ALLEGRA PO) Take by mouth.   Marland Kitchen guaiFENesin (MUCINEX) 600 MG 12 hr tablet Take 600 mg by mouth 2 (two) times daily.   Marland Kitchen ibuprofen (ADVIL,MOTRIN) 200 MG tablet Take 600-800 mg by mouth every 6 (six) hours as needed.   Marland Kitchen levocetirizine (XYZAL) 5 MG tablet Take 1 tablet (5 mg total) by mouth every evening.   . metFORMIN (GLUCOPHAGE) 500 MG tablet Take 1,000 mg by mouth daily with breakfast.  04/15/2016: Takes extended release 500mg , 2 tablets every morning  . NATURE-THROID 97.5 MG TABS    . Olopatadine HCl (PAZEO) 0.7 % SOLN Place 1 drop into both eyes 1 day or 1 dose.   . progesterone (PROMETRIUM) 200 MG capsule    . Spacer/Aero-Holding Chambers [facility] DIAMOND) DEVI Use as directed with inhaler. Dx:  J45.40 Asthma   . [DISCONTINUED] estradiol (ESTRACE) 1 MG tablet    . acetaminophen (TYLENOL) 500 MG tablet Take 1,000 mg by mouth every 6 (six) hours as needed for headache. Reported on 06/17/2016 05/02/2014: Prn   . NONFORMULARY OR COMPOUNDED [place]: Onychomycosis Nail Lacquer - Fluconazole 2%, Terbinafine 1%, +Urea as maximum amount for this compound (Patient not taking: Reported on 04/11/2018)   . PROAIR HFA 108 (90  Base) MCG/ACT inhaler Inhale 2 puffs into the lungs every 4 (four) hours as needed. (Patient not taking: Reported on 02/07/2018) 04/11/2018: Has needed a few times in the last few days  . [DISCONTINUED] metFORMIN (GLUCOPHAGE-XR) 500 MG 24 hr tablet    . [DISCONTINUED] QVAR REDIHALER 80 MCG/ACT inhaler Inhale 2 puffs into the lungs 2 (two) times daily.    No facility-administered encounter medications on file as of 04/11/2018.    No Known Allergies  ROS: fever and URI symptoms per HPI. No nausea, vomiting, diarrhea, skin rashes, urinary  complaints (just stress incontinence).   URI symptoms per HPI.  PHYSICAL EXAM: BP 130/74   Pulse 88   Temp 100 F (37.8 C) (Tympanic)   Ht 5' 1.5" (1.562 m)   Wt 167 lb 3.2 oz (75.8 kg)   BMI 31.08 kg/m   Well appearing ,pleasant female, with occasional dry cough during visit. In no distress. HEENT: PERRL, EOMI, conjunctiva and sclera are clear. Nasal mucosa is mild-mod edematous with yellow-green mucus L>R.  TM's and EAC's normal.  OP is clear. Sinuses mildly tender over bilateral maxillary sinuses, nontender over frontal. Neck: no lymphadenopathy or mass Heart: regular rate and rhythm, no murmur Lungs clear bilaterally, no wheezes, rales, ronchi Extremities: no edema Skin: normal turgor, no rash Psych: normal mood, affect, hygiene and grooming Neuro: alert and oriented, cranial nerves intact, normal gait  ASSESSMENT/PLAN: Acute maxillary sinusitis, recurrence not specified - Plan: amoxicillin-clavulanate (AUGMENTIN) 875-125 MG tablet    Drink plenty of water. Use decongestants such as sudafed as needed for sinus pain. Consider sinus rinses once or twice daily. Continue guaifenesin (expectorant in Mucinex) to keep the mucus thin. You may use dextromethorphan as a cough suppressant (either DM version of mucinex, or a separate Delsym syrup). Take the antibiotics twice daily as directed. Consider probiotics if you develop any diarrhea. Contact us for diflucan if you et symptoms of yeast infection from the antibiotic  Continue your current allergy and asthma medications.

## 2018-04-11 NOTE — Patient Instructions (Addendum)
  Drink plenty of water. Use decongestants such as sudafed as needed for sinus pain. Consider sinus rinses once or twice daily. Continue guaifenesin (expectorant in Mucinex) to keep the mucus thin. You may use dextromethorphan as a cough suppressant (either DM version of mucinex, or a separate Delsym syrup). Take the antibiotics twice daily as directed. Consider probiotics if you develop any diarrhea. Contact us for diflucan if you et symptoms of yeast infection from the antibiotic  Continue your current allergy and asthma medications.    Sinusitis, Adult Sinusitis is soreness and inflammation of your sinuses. Sinuses are hollow spaces in the bones around your face. They are located:  Around your eyes.  In the middle of your forehead.  Behind your nose.  In your cheekbones.  Your sinuses and nasal passages are lined with a stringy fluid (mucus). Mucus normally drains out of your sinuses. When your nasal tissues get inflamed or swollen, the mucus can get trapped or blocked so air cannot flow through your sinuses. This lets bacteria, viruses, and funguses grow, and that leads to infection. Follow these instructions at home: Medicines  Take, use, or apply over-the-counter and prescription medicines only as told by your doctor. These may include nasal sprays.  If you were prescribed an antibiotic medicine, take it as told by your doctor. Do not stop taking the antibiotic even if you start to feel better. Hydrate and Humidify  Drink enough water to keep your pee (urine) clear or pale yellow.  Use a cool mist humidifier to keep the humidity level in your home above 50%.  Breathe in steam for 10-15 minutes, 3-4 times a day or as told by your doctor. You can do this in the bathroom while a hot shower is running.  Try not to spend time in cool or dry air. Rest  Rest as much as possible.  Sleep with your head raised (elevated).  Make sure to get enough sleep each night. General  instructions  Put a warm, moist washcloth on your face 3-4 times a day or as told by your doctor. This will help with discomfort.  Wash your hands often with soap and water. If there is no soap and water, use hand sanitizer.  Do not smoke. Avoid being around people who are smoking (secondhand smoke).  Keep all follow-up visits as told by your doctor. This is important. Contact a doctor if:  You have a fever.  Your symptoms get worse.  Your symptoms do not get better within 10 days. Get help right away if:  You have a very bad headache.  You cannot stop throwing up (vomiting).  You have pain or swelling around your face or eyes.  You have trouble seeing.  You feel confused.  Your neck is stiff.  You have trouble breathing. This information is not intended to replace advice given to you by your health care provider. Make sure you discuss any questions you have with your health care provider. Document Released: 05/04/2008 Document Revised: 07/12/2016 Document Reviewed: 09/11/2015 Elsevier Interactive Patient Education  [employer].

## 2018-04-14 ENCOUNTER — Ambulatory Visit: Payer: Self-pay

## 2018-04-22 ENCOUNTER — Ambulatory Visit (INDEPENDENT_AMBULATORY_CARE_PROVIDER_SITE_OTHER): Payer: BC Managed Care – PPO

## 2018-04-22 DIAGNOSIS — J309 Allergic rhinitis, unspecified: Secondary | ICD-10-CM | POA: Diagnosis not present

## 2018-04-29 ENCOUNTER — Ambulatory Visit (INDEPENDENT_AMBULATORY_CARE_PROVIDER_SITE_OTHER): Payer: BC Managed Care – PPO

## 2018-04-29 DIAGNOSIS — J309 Allergic rhinitis, unspecified: Secondary | ICD-10-CM

## 2018-05-13 ENCOUNTER — Ambulatory Visit (INDEPENDENT_AMBULATORY_CARE_PROVIDER_SITE_OTHER): Payer: BC Managed Care – PPO

## 2018-05-13 DIAGNOSIS — J309 Allergic rhinitis, unspecified: Secondary | ICD-10-CM

## 2018-05-15 NOTE — Progress Notes (Signed)
Chief Complaint  Patient presents with  . Annual Exam    fasting (blood in lab) annual exam. Usually does pap with Dr Julien Girt but would like to do here if you are okay with that. Will schedule eye exam @ [facility]. No concerns. Is trying to have breast reduction due to back pain and rashes underneath her breasts.     Jacqueline Holmes is a 47 y.o. female who presents for a complete physical.  She sees Dr. Julien Girt for her GYN care, though hasn't seen her yet this year. She came earlier this morning to have fasting labs drawn, then forgot to come for her appointment time this afternoon (came late after being called).  She was seen last month with sinusitis, which resolved after treatment with Augmentin.  Denies recurrent symptoms.  She has asthma and allergies, followed by allergist. She is on immunotherapy.  She has had elevated BPs in the past at sick visits. She watches the sodium in her diet.  No more headaches than her usual (combo of sinus, migraines/cluster), doesn't have headache currently.  BP Readings from Last 3 Encounters:  04/11/18 130/74  02/07/18 128/80  01/27/18 140/90  She doesn't monitor her BP elsewhere.  Has only been getting regular exercise in the last 3 weeks.  She had sleep study in 02/2018, after reporting in March that she has been diagnosed with sleep apnea in the distant past (15 years ago); recalls it was mild and didn't need treatment.  +snoring.  Husband hasn't mentioned apnea.  Doesn't feel refreshed in the mornings, even though she gets plenty of sleep. Sleep study 02/2018 showed: IMPRESSIONS - No significant obstructive sleep apnea occurred during this study (AHI = 2.6/h). - No significant central sleep apnea occurred during this study (CAI = 0.3/h). - The patient had minimal or no oxygen desaturation during the study (Min O2 = 90%) - Patient snored.  DIAGNOSIS - Normal study - Primary snoring  RECOMMENDATIONS - Positional therapy avoiding supine  position during sleep. - Be careful with alcohol, sedatives and other CNS depressants that may worsen sleep apnea and disrupt normal sleep architecture. - Sleep hygiene should be reviewed to assess factors that may improve sleep quality. - Weight management and regular exercise should be initiated or continued.  Sees Dr. Tye Savoy at [facility], who treats her hypothyroidism, postmenopausal symptoms with estrogen replacement, and pre-diabetes with metformin.  He monitors labs, including A1c. Last labs was March/April.   No records are available.  Immunization History  Administered Date(s) Administered  . Influenza-Unspecified 09/13/2016  gets yearly flu shots, usually through work, last year she got it at [employer]. Can't recall last tetanus, possibly more than 10 years ago. Last Pap smear: in epic--03/2015 by Dr. Julien Girt; normal pap (no HPV testing done); she reports going yearly, last in the summer 2018, has paps yearly; no known abnormal paps in many years Last mammogram: 2018 through Dr. Julien Girt office, normal per pt Last colonoscopy: never Last DEXA: never Dentist: twice a year Ophtho: once yearly, went last year. Has glasses, rarely wears them Exercise:  For the last 3 weeks (none prior): Walks 2 miles/day (45 mins) every day (most days), no weight-bearing exercise.  Past Medical History:  Diagnosis Date  . Asthma   . Cough   . Diabetes mellitus arising in pregnancy    now pre-diabetes  . Family history of breast cancer    MGM  . Fatigue   . Generalized headaches    migraines on occasion.  Marland Kitchen  Hyperlipidemia   . Hypothyroidism    s/p thyroidectomy for Grave's disease (Dr. Tye [facility] at [facility])  . IBS (irritable bowel syndrome)   . Incontinence   . Migraine   . Night sweats   . Sinus problem    runny nose  . SOB (shortness of breath)     Past Surgical History:  Procedure Laterality Date  . ABDOMINOPLASTY  12/2015   Dr. Towanda Malkin  .  CESAREAN SECTION    . DILATATION & CURETTAGE/HYSTEROSCOPY WITH TRUECLEAR N/A 01/16/2014   Procedure: DILATATION & CURETTAGE/HYSTEROSCOPY WITH TRUCLEAR;  Surgeon: Marylynn Pearson, MD;  Location: [facility];  Service: Gynecology;  Laterality: N/A;  . LASIK    . TOTAL THYROIDECTOMY  07/30/11   Dr. Harlow Asa (Grave's disease)  . VULVA Milagros Loll BIOPSY N/A 01/16/2014   Procedure: VULVAR BIOPSY;  Surgeon: Marylynn Pearson, MD;  Location: [facility];  Service: Gynecology;  Laterality: N/A;    Social History   Socioeconomic History  . Marital status: Married    Spouse name: Not on file  . Number of children: 1  . Years of education: Not on file  . Highest education level: Not on file  Occupational History  . 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 Financial resource strain: Not on file  . Food insecurity:    Worry: Not on file    Inability: Not on file  . Transportation needs:    Medical: Not on file    Non-medical: Not on file  Tobacco Use  . Smoking status: Never Smoker  . Smokeless tobacco: Never Used  Substance and Sexual Activity  . Alcohol use: Yes    Comment: once per week or less  . Drug use: No  . Sexual activity: Yes    Partners: Male    Birth control/protection: None    Comment: postmenopausal (LMP3/2018)  Lifestyle  . Physical activity:    Days per week: Not on file    Minutes per session: Not on file  . Stress: Not on file  Relationships  . Social connections:    Talks on phone: Not on file    Gets together: Not on file    Attends religious service: Not on file    Active member of club or organization: Not on file    Attends meetings of clubs or organizations: Not on file    Relationship status: Not on file  . Intimate partner violence:    Fear of current or ex partner: Not on file    Emotionally abused: Not on file    Physically abused: Not on file    Forced sexual activity: Not on  file  Other Topics Concern  . Not on file  Social History Narrative   Divorced and re-married.  Lives with her husband.  Son is grown and lives in own apartment    Family History  Problem Relation Age of Onset  . Cancer Father        prostate  . Allergic rhinitis Mother   . Asthma Mother   . Alpha-1 antitrypsin deficiency Mother   . Cancer Maternal Grandmother        pancreatic  . Heart disease Maternal Grandfather        CABG in 60's  . Diabetes Paternal Grandmother   . Diabetes Paternal Grandfather   . Heart disease Paternal Grandfather   . Angioedema Neg Hx   . Eczema Neg Hx  Outpatient Encounter Medications as of 05/16/2018  Medication Sig Note  . Azelastine-Fluticasone (DYMISTA) 137-50 MCG/ACT SUSP Place 2 sprays into the nose 2 (two) times daily.   . beclomethasone (QVAR REDIHALER) 80 MCG/ACT inhaler Inhale 2 puffs into the lungs 2 (two) times daily.   . calcium-vitamin D (OSCAL WITH D) 500-200 MG-UNIT per tablet Take 1 tablet by mouth daily with breakfast.   . cholecalciferol (VITAMIN D) 1000 UNITS tablet Take 5,000 Units by mouth daily.   Marland Kitchen Fexofenadine HCl (ALLEGRA PO) Take by mouth.   . levocetirizine (XYZAL) 5 MG tablet Take 1 tablet (5 mg total) by mouth every evening. 05/16/2018: (uses this just during seasonal flares, plans to stop soon)  . metFORMIN (GLUCOPHAGE) 500 MG tablet Take 1,000 mg by mouth daily with breakfast.  04/15/2016: Takes extended release 597m, 2 tablets every morning  . NATURE-THROID 97.5 MG TABS    . progesterone (PROMETRIUM) 200 MG capsule    . Spacer/Aero-Holding Chambers [facility]) DEVI Use as directed with inhaler. Dx:  J45.40 Asthma   . acetaminophen (TYLENOL) 500 MG tablet Take 1,000 mg by mouth every 6 (six) hours as needed for headache. Reported on 06/17/2016 05/02/2014: Prn   . budesonide-formoterol (SYMBICORT) 160-4.5 MCG/ACT inhaler Two puffs with spacer device twice a day during asthma flares.  Rinse, gargle and spit after  use. (Patient not taking: Reported on 05/16/2018) 05/16/2018: Uses prn with flares  . guaiFENesin (MUCINEX) 600 MG 12 hr tablet Take 600 mg by mouth 2 (two) times daily.   .Marland Kitchenibuprofen (ADVIL,MOTRIN) 200 MG tablet Take 600-800 mg by mouth every 6 (six) hours as needed.   . Olopatadine HCl (PAZEO) 0.7 % SOLN Place 1 drop into both eyes 1 day or 1 dose. (Patient not taking: Reported on 05/16/2018)   . PROAIR HFA 108 (90 Base) MCG/ACT inhaler Inhale 2 puffs into the lungs every 4 (four) hours as needed. (Patient not taking: Reported on 02/07/2018)   . [DISCONTINUED] amoxicillin-clavulanate (AUGMENTIN) 875-125 MG tablet Take 1 tablet by mouth 2 (two) times daily.   . [DISCONTINUED] metFORMIN (GLUCOPHAGE-XR) 500 MG 24 hr tablet    . [DISCONTINUED] NONFORMULARY OR COMPOUNDED ITEM [facility]: Onychomycosis Nail Lacquer - Fluconazole 2%, Terbinafine 1%, +Urea as maximum amount for this compound (Patient not taking: Reported on 04/11/2018)    No facility-administered encounter medications on file as of 05/16/2018.    Also has estrogen pellet inserted q5 mos (first done last month, right buttocks)  No Known Allergies  ROS:  The patient denies anorexia, fever, weight changes, vision changes, decreased hearing, sore throat, breast concerns, chest pain, palpitations, dizziness, syncope, dyspnea on exertion, cough, swelling, nausea, vomiting, diarrhea, constipation, melena, hematochezia, hematuria, incontinence, dysuria, vaginal bleeding, discharge, odor or itch, genital lesions, joint pains, numbness, tingling, weakness, tremor, suspicious skin lesions, depression, anxiety, abnormal bleeding/bruising, or enlarged lymph nodes. Intermittent right earache, seems to be positional (like when she leans forward in doing her hair; short-lived). Denies ear plugging, vertigo, hearing loss. H/o headaches, last bothered her last week, relieved by ibuprofen if taken early enough.  (not as bad as they used to be). No period  in over a year; told postmenopausal per integrative doctor.  Denies hot flashes, night sweats; (has estrogen pellet, in her buttock, implanted q5 mos, first was inserted about a month ago). Some more heartburn recently. Denies changes in her diet.  Didn't use meds. Some shortness of breath only with very rigorous exercise (running up and down the stairs, sometimes needs inhaler). Gets  discomfort/achey/tightness in both calves with walking.  Relieved by rest, occurs most times she walks. Started walking 3 weeks ago. Mole left shoulder--husband wants checked.  She doesn't think it has changed. Occasional pain L lower abdomen, "ovulation pain", saw GYN, said everything was fine.  Happens only occasionally , lasts a few mins up to hour.    PHYSICAL EXAM:  BP (!) 150/90   Pulse 92   Ht '5\' 1"'  (1.549 m)   Wt 166 lb 3.2 oz (75.4 kg)   BMI 31.40 kg/m    138/90 on repeat  Wt Readings from Last 3 Encounters:  05/16/18 166 lb 3.2 oz (75.4 kg)  04/11/18 167 lb 3.2 oz (75.8 kg)  03/15/18 161 lb (73 kg)    General Appearance:    Alert, cooperative, no distress, appears stated age  Head:    Normocephalic, without obvious abnormality, atraumatic  Eyes:    PERRL, conjunctiva/corneas clear, EOM's intact, fundi    benign  Ears:    Normal TM's and external ear canals  Nose:   Nares normal, mucosa normal, no drainage or sinus   tenderness  Throat:   Lips, mucosa, and tongue normal; teeth and gums normal  Neck:   Supple, no lymphadenopathy;  thyroid: surgically absent, WHSS anterior neck.  Slightly thickened tissue/scarring present; no carotid bruit or JVD  Back:    Spine nontender, no curvature, ROM normal, no CVA     tenderness  Lungs:     Clear to auscultation bilaterally without wheezes, rales or     ronchi; respirations unlabored  Chest Wall:    No tenderness or deformity   Heart:    Regular rate and rhythm, S1 and S2 normal, no murmur, rub   or gallop  Breast Exam:    Deferred to GYN   Abdomen:     Soft, nondistended, normoactive bowel sounds, no masses, no hepatosplenomegaly. Mild LLQ tenderness, normal bowel sounds, no rebound, guarding or masses  Genitalia:    Deferred to GYN     Extremities:   No clubbing, cyanosis or edema  Pulses:   2+ and symmetric all extremities, including DP and PT  Skin:   Skin color, texture, turgor normal, no rashes or lesions. Many tattoos (including angel oscar and devil cookie monster at her medial ankles). Left top of shoulder (at bra strap area) slightly darker round, slightly raised mole. Uniform color.  Lymph nodes:   Cervical, supraclavicular, and axillary nodes normal  Neurologic:   CNII-XII intact, normal strength, sensation and gait; reflexes 2+ and symmetric throughout       Psych:   Normal mood, affect, hygiene and grooming.     ASSESSMENT/PLAN:  Annual physical exam - Plan: Lipid panel, Comprehensive metabolic panel  Snoring - reviewed sleep study, no sleep apnea. Reviewed treatments for snoring, if desired (to f/u ENT)  Immunization due - Plan: Tdap vaccine greater than or equal to 7yo IM, Pneumococcal polysaccharide vaccine 23-valent greater than or equal to 2yo subcutaneous/IM  Uncomplicated asthma, unspecified asthma severity, unspecified whether persistent - under care of allergist; due for pneumovax, as she states she never had. Cont yearly flu shots - Plan: Pneumococcal polysaccharide vaccine 23-valent greater than or equal to 2yo subcutaneous/IM  Vitamin D deficiency - Plan: VITAMIN D 25 Hydroxy (Vit-D Deficiency, Fractures)  Prediabetes - managed iwth metformin per Dr. Tye Savoy. Will get records. Encouraged weight loss, proper diet, regular exercise - Plan: Comprehensive metabolic panel  Bilateral calf pain - with exertion; normal pulses, so not claudication/PAD.  Discussed shoe-wear, stretches.  Elevated blood pressure reading - monitor elsewhere; low sodium diet, regular exercise, weight loss; f/u if persistently  >135/85, goal is <130/80.  Right ear pain - normal exam, reassured.  Ddx reviewed  LLQ pain - mildly tender; Ddx reviewed, including possible mild adhesions; no mass, guarding, e/o obstruction. f/u if worsens.  Noted in [place] that she desires breast reduction, has rashes and pain. This was not discussed today, as breast exam was deferred to her GYN, and she didn't mention rashes or pain to me during ROS or visit. There were some time constraints, and many other topics discussed, but not these.   Lipids, c-met, Vit D Not doing TSH, A1c--done by Dr. Tye Savoy Pt to sign ROR--get labs results/notes  TdaP and pneumovax today (due to asthma)  Reassured that her calf pain doesn't suggest PAD/claudication, given normal peripheral pulses. Discussed proper shoes, stretching, hydration.  Get records from and f/u with GYN Get labs from Dr. Tye Savoy (and records)  Discussed monthly self breast exams and yearly mammograms; at least 30 minutes of aerobic activity at least 5 days/week; proper sunscreen use reviewed; healthy diet, including goals of calcium and vitamin D intake and alcohol recommendations (less than or equal to 1 drink/day) reviewed; regular seatbelt use; changing batteries in smoke detectors.  Immunization recommendations discussed--TdaP and pneumovax given today; continue yearly flu shots.  Colonoscopy recommendations reviewed, age 25.  Your blood pressure was a little elevated today.  Be sure to periodically monitor it, get regular exercise, drink plenty of water, and continue to follow a low sodium diet. If BP elsewhere (check to see if allergist can check) remains >130/80 consistently, we may need to address further.

## 2018-05-16 ENCOUNTER — Ambulatory Visit: Payer: BC Managed Care – PPO | Admitting: Family Medicine

## 2018-05-16 ENCOUNTER — Encounter: Payer: Self-pay | Admitting: Family Medicine

## 2018-05-16 VITALS — BP 150/90 | HR 92 | Ht 61.0 in | Wt 166.2 lb

## 2018-05-16 DIAGNOSIS — R1032 Left lower quadrant pain: Secondary | ICD-10-CM | POA: Diagnosis not present

## 2018-05-16 DIAGNOSIS — Z0000 Encounter for general adult medical examination without abnormal findings: Secondary | ICD-10-CM

## 2018-05-16 DIAGNOSIS — Z23 Encounter for immunization: Secondary | ICD-10-CM

## 2018-05-16 DIAGNOSIS — R030 Elevated blood-pressure reading, without diagnosis of hypertension: Secondary | ICD-10-CM | POA: Diagnosis not present

## 2018-05-16 DIAGNOSIS — M79662 Pain in left lower leg: Secondary | ICD-10-CM | POA: Diagnosis not present

## 2018-05-16 DIAGNOSIS — H9201 Otalgia, right ear: Secondary | ICD-10-CM | POA: Diagnosis not present

## 2018-05-16 DIAGNOSIS — J45909 Unspecified asthma, uncomplicated: Secondary | ICD-10-CM

## 2018-05-16 DIAGNOSIS — R7303 Prediabetes: Secondary | ICD-10-CM | POA: Diagnosis not present

## 2018-05-16 DIAGNOSIS — M79661 Pain in right lower leg: Secondary | ICD-10-CM | POA: Diagnosis not present

## 2018-05-16 DIAGNOSIS — E559 Vitamin D deficiency, unspecified: Secondary | ICD-10-CM

## 2018-05-16 DIAGNOSIS — R0683 Snoring: Secondary | ICD-10-CM

## 2018-05-16 NOTE — Patient Instructions (Addendum)
HEALTH MAINTENANCE RECOMMENDATIONS:  It is recommended that you get at least 30 minutes of aerobic exercise at least 5 days/week (for weight loss, you may need as much as 60-90 minutes). This can be any activity that gets your heart rate up. This can be divided in 10-15 minute intervals if needed, but try and build up your endurance at least once a week.  Weight bearing exercise is also recommended twice weekly.  Eat a healthy diet with lots of vegetables, fruits and fiber.  "Colorful" foods have a lot of vitamins (ie green vegetables, tomatoes, red peppers, etc).  Limit sweet tea, regular sodas and alcoholic beverages, all of which has a lot of calories and sugar.  Up to 1 alcoholic drink daily may be beneficial for women (unless trying to lose weight, watch sugars).  Drink a lot of water.  Calcium recommendations are 1200-1500 mg daily (1500 mg for postmenopausal women or women without ovaries), and vitamin D 1000 IU daily.  This should be obtained from diet and/or supplements (vitamins), and calcium should not be taken all at once, but in divided doses.  Monthly self breast exams and yearly mammograms for women over the age of 70 is recommended.  Sunscreen of at least SPF 30 should be used on all sun-exposed parts of the skin when outside between the hours of 10 am and 4 pm (not just when at beach or pool, but even with exercise, golf, tennis, and yard work!)  Use a sunscreen that says "broad spectrum" so it covers both UVA and UVB rays, and make sure to reapply every 1-2 hours.  Remember to change the batteries in your smoke detectors when changing your clock times in the spring and fall.  Use your seat belt every time you are in a car, and please drive safely and not be distracted with cell phones and texting while driving.  Please schedule follow-up with Dr. Julien Girt for your breast, pelvic exam and mammogram.  Your blood pressure was a little elevated today.  Be sure to periodically monitor  it, get regular exercise, drink plenty of water, and continue to follow a low sodium diet. If BP elsewhere (check to see if allergist can check) remains >130/80 consistently, we may need to address further.   Low-Sodium Eating Plan Sodium, which is an element that makes up salt, helps you maintain a healthy balance of fluids in your body. Too much sodium can increase your blood pressure and cause fluid and waste to be held in your body. Your health care provider or dietitian may recommend following this plan if you have high blood pressure (hypertension), kidney disease, liver disease, or heart failure. Eating less sodium can help lower your blood pressure, reduce swelling, and protect your heart, liver, and kidneys. What are tips for following this plan? General guidelines  Most people on this plan should limit their sodium intake to 1,500-2,000 mg (milligrams) of sodium each day. Reading food labels  The Nutrition Facts label lists the amount of sodium in one serving of the food. If you eat more than one serving, you must multiply the listed amount of sodium by the number of servings.  Choose foods with less than 140 mg of sodium per serving.  Avoid foods with 300 mg of sodium or more per serving. Shopping  Look for lower-sodium products, often labeled as "low-sodium" or "no salt added."  Always check the sodium content even if foods are labeled as "unsalted" or "no salt added".  Buy fresh foods. ?  Avoid canned foods and premade or frozen meals. ? Avoid canned, cured, or processed meats  Buy breads that have less than 80 mg of sodium per slice. Cooking  Eat more home-cooked food and less restaurant, buffet, and fast food.  Avoid adding salt when cooking. Use salt-free seasonings or herbs instead of table salt or sea salt. Check with your health care provider or pharmacist before using salt substitutes.  Cook with plant-based oils, such as canola, sunflower, or olive oil. Meal  planning  When eating at a restaurant, ask that your food be prepared with less salt or no salt, if possible.  Avoid foods that contain MSG (monosodium glutamate). MSG is sometimes added to [place] food, bouillon, and some canned foods. What foods are recommended? The items listed may not be a complete list. Talk with your dietitian about what dietary choices are best for you. Grains Low-sodium cereals, including oats, puffed wheat and rice, and shredded wheat. Low-sodium crackers. Unsalted rice. Unsalted pasta. Low-sodium bread. Whole-grain breads and whole-grain pasta. Vegetables Fresh or frozen vegetables. "No salt added" canned vegetables. "No salt added" tomato sauce and paste. Low-sodium or reduced-sodium tomato and vegetable juice. Fruits Fresh, frozen, or canned fruit. Fruit juice. Meats and other protein foods Fresh or frozen (no salt added) meat, poultry, seafood, and fish. Low-sodium canned tuna and salmon. Unsalted nuts. Dried peas, beans, and lentils without added salt. Unsalted canned beans. Eggs. Unsalted nut butters. Dairy Milk. Soy milk. Cheese that is naturally low in sodium, such as ricotta cheese, fresh mozzarella, or Swiss cheese Low-sodium or reduced-sodium cheese. Cream cheese. Yogurt. Fats and oils Unsalted butter. Unsalted margarine with no trans fat. Vegetable oils such as canola or olive oils. Seasonings and other foods Fresh and dried herbs and spices. Salt-free seasonings. Low-sodium mustard and ketchup. Sodium-free salad dressing. Sodium-free light mayonnaise. Fresh or refrigerated horseradish. Lemon juice. Vinegar. Homemade, reduced-sodium, or low-sodium soups. Unsalted popcorn and pretzels. Low-salt or salt-free chips. What foods are not recommended? The items listed may not be a complete list. Talk with your dietitian about what dietary choices are best for you. Grains Instant hot cereals. Bread stuffing, pancake, and biscuit mixes. Croutons. Seasoned rice or  pasta mixes. Noodle soup cups. Boxed or frozen macaroni and cheese. Regular salted crackers. Self-rising flour. Vegetables Sauerkraut, pickled vegetables, and relishes. Olives. [place] fries. Onion rings. Regular canned vegetables (not low-sodium or reduced-sodium). Regular canned tomato sauce and paste (not low-sodium or reduced-sodium). Regular tomato and vegetable juice (not low-sodium or reduced-sodium). Frozen vegetables in sauces. Meats and other protein foods Meat or fish that is salted, canned, smoked, spiced, or pickled. Bacon, ham, sausage, hotdogs, corned beef, chipped beef, packaged lunch meats, salt pork, jerky, pickled herring, anchovies, regular canned tuna, sardines, salted nuts. Dairy Processed cheese and cheese spreads. Cheese curds. Blue cheese. Feta cheese. String cheese. Regular cottage cheese. Buttermilk. Canned milk. Fats and oils Salted butter. Regular margarine. Ghee. Bacon fat. Seasonings and other foods Onion salt, garlic salt, seasoned salt, table salt, and sea salt. Canned and packaged gravies. Worcestershire sauce. Tartar sauce. Barbecue sauce. Teriyaki sauce. Soy sauce, including reduced-sodium. Steak sauce. Fish sauce. Oyster sauce. Cocktail sauce. Horseradish that you find on the shelf. Regular ketchup and mustard. Meat flavorings and tenderizers. Bouillon cubes. Hot sauce and Tabasco sauce. Premade or packaged marinades. Premade or packaged taco seasonings. Relishes. Regular salad dressings. Salsa. Potato and tortilla chips. Corn chips and puffs. Salted popcorn and pretzels. Canned or dried soups. Pizza. Frozen entrees and pot pies. Summary  Eating less sodium can help lower your blood pressure, reduce swelling, and protect your heart, liver, and kidneys.  Most people on this plan should limit their sodium intake to 1,500-2,000 mg (milligrams) of sodium each day.  Canned, boxed, and frozen foods are high in sodium. Restaurant foods, fast foods, and pizza are also  very high in sodium. You also get sodium by adding salt to food.  Try to cook at home, eat more fresh fruits and vegetables, and eat less fast food, canned, processed, or prepared foods. This information is not intended to replace advice given to you by your health care provider. Make sure you discuss any questions you have with your health care provider. Document Released: 05/08/2002 Document Revised: 11/09/2016 Document Reviewed: 11/09/2016 Elsevier Interactive Patient Education  [employer].

## 2018-05-17 LAB — LIPID PANEL
CHOL/HDL RATIO: 3.8 ratio (ref 0.0–4.4)
CHOLESTEROL TOTAL: 147 mg/dL (ref 100–199)
HDL: 39 mg/dL — ABNORMAL LOW (ref >39)
LDL Calculated: 90 mg/dL (ref 0–99)
TRIGLYCERIDES: 90 mg/dL (ref 0–149)
VLDL Cholesterol Cal: 18 mg/dL (ref 5–40)

## 2018-05-17 LAB — COMPREHENSIVE METABOLIC PANEL
A/G RATIO: 1.4 (ref 1.2–2.2)
ALT: 33 IU/L — AB (ref 0–32)
AST: 14 IU/L (ref 0–40)
Albumin: 4.2 g/dL (ref 3.5–5.5)
Alkaline Phosphatase: 75 IU/L (ref 39–117)
BUN/Creatinine Ratio: 27 — ABNORMAL HIGH (ref 9–23)
BUN: 17 mg/dL (ref 6–24)
Bilirubin Total: 0.3 mg/dL (ref 0.0–1.2)
CALCIUM: 9.3 mg/dL (ref 8.7–10.2)
CO2: 21 mmol/L (ref 20–29)
Chloride: 104 mmol/L (ref 96–106)
Creatinine, Ser: 0.63 mg/dL (ref 0.57–1.00)
GFR, EST AFRICAN AMERICAN: 124 mL/min/{1.73_m2} (ref >59)
GFR, EST NON AFRICAN AMERICAN: 107 mL/min/{1.73_m2} (ref >59)
Globulin, Total: 2.9 g/dL (ref 1.5–4.5)
Glucose: 108 mg/dL — ABNORMAL HIGH (ref 65–99)
POTASSIUM: 4.9 mmol/L (ref 3.5–5.2)
Sodium: 140 mmol/L (ref 134–144)
TOTAL PROTEIN: 7.1 g/dL (ref 6.0–8.5)

## 2018-05-17 LAB — VITAMIN D 25 HYDROXY (VIT D DEFICIENCY, FRACTURES): Vit D, 25-Hydroxy: 94.5 ng/mL (ref 30.0–100.0)

## 2018-05-20 ENCOUNTER — Ambulatory Visit (INDEPENDENT_AMBULATORY_CARE_PROVIDER_SITE_OTHER): Payer: BC Managed Care – PPO

## 2018-05-20 DIAGNOSIS — J309 Allergic rhinitis, unspecified: Secondary | ICD-10-CM | POA: Diagnosis not present

## 2018-05-23 ENCOUNTER — Ambulatory Visit: Payer: BC Managed Care – PPO | Admitting: Allergy and Immunology

## 2018-05-23 ENCOUNTER — Encounter: Payer: Self-pay | Admitting: Allergy and Immunology

## 2018-05-23 ENCOUNTER — Ambulatory Visit: Payer: Self-pay | Admitting: *Deleted

## 2018-05-23 ENCOUNTER — Encounter: Payer: BC Managed Care – PPO | Admitting: Family Medicine

## 2018-05-23 VITALS — BP 98/70 | HR 72 | Resp 16

## 2018-05-23 DIAGNOSIS — J3089 Other allergic rhinitis: Secondary | ICD-10-CM | POA: Diagnosis not present

## 2018-05-23 DIAGNOSIS — J309 Allergic rhinitis, unspecified: Secondary | ICD-10-CM

## 2018-05-23 DIAGNOSIS — J4530 Mild persistent asthma, uncomplicated: Secondary | ICD-10-CM

## 2018-05-23 NOTE — Assessment & Plan Note (Signed)
Currently well controlled.  Continue Qvar 80 g, 2 inhalations twice daily.  During upper respiratory tract infections and/or asthma flares, switched to Symbicort 160/4.5 g, 2 inhalations via spacer device twice daily until symptoms have returned to baseline.  Continue albuterol HFA, 1 to 2 inhalations every 6 hours if needed.  Subjective and objective measures of pulmonary function will be followed and the treatment plan will be adjusted accordingly.

## 2018-05-23 NOTE — Patient Instructions (Addendum)
Mild persistent asthma, uncomplicated Currently well controlled.  Continue Qvar 80 g, 2 inhalations twice daily.  During upper respiratory tract infections and/or asthma flares, switched to Symbicort 160/4.5 g, 2 inhalations via spacer device twice daily until symptoms have returned to baseline.  Continue albuterol HFA, 1 to 2 inhalations every 6 hours if needed.  Subjective and objective measures of pulmonary function will be followed and the treatment plan will be adjusted accordingly.  Allergic rhinitis Stable.  Continue appropriate allergen avoidance measures and immunotherapy injections scribed.  Dymista and/or nasal saline irrigation if needed.   Return in about 5 months (around 10/23/2018), or if symptoms worsen or fail to improve.

## 2018-05-23 NOTE — Assessment & Plan Note (Signed)
Stable.  Continue appropriate allergen avoidance measures and immunotherapy injections scribed.  Dymista and/or nasal saline irrigation if needed.

## 2018-05-23 NOTE — Progress Notes (Signed)
Follow-up Note  RE: Jacqueline Holmes MRN: [id number] DOB: 16-Apr-1971 Date of Office Visit: 05/23/2018  Primary care provider: [name], MD Referring provider: [name], MD  History of present illness: Jacqueline Holmes is a 47 y.o. female with persistent asthma and allergic rhinitis on immunotherapy presenting today for follow-up.  She was last seen in this clinic in February 2019.  She reports that her asthma has been well controlled while using Qvar 80 g Redihaler, 2 inhalations twice daily.  She has only required albuterol rescue 1 or 2 times per month on average over the past couple/few months.  She reports that throughout winter she did switch to Symbicort 160 for a week or so while she had upper respiratory tract infections and believes that this prevented her from requiring prednisone this past winter.  She is receiving aeroallergen immunotherapy injections without problems or complications and reports that her nasal allergy symptoms are currently well controlled.  Assessment and plan: Mild persistent asthma, uncomplicated Currently well controlled.  Continue Qvar 80 g, 2 inhalations twice daily.  During upper respiratory tract infections and/or asthma flares, switched to Symbicort 160/4.5 g, 2 inhalations via spacer device twice daily until symptoms have returned to baseline.  Continue albuterol HFA, 1 to 2 inhalations every 6 hours if needed.  Subjective and objective measures of pulmonary function will be followed and the treatment plan will be adjusted accordingly.  Allergic rhinitis Stable.  Continue appropriate allergen avoidance measures and immunotherapy injections scribed.  Dymista and/or nasal saline irrigation if needed.   No orders of the defined types were placed in this encounter.   Diagnostics: Spirometry:  Normal with an FEV1 of 82% predicted and an FEV1 ratio of 112%.  Please see scanned spirometry results for details.    Physical examination: Blood  pressure 98/70, pulse 72, resp. rate 16, SpO2 96 %.  General: Alert, interactive, in no acute distress. HEENT: TMs pearly gray, turbinates minimally edematous without discharge, post-pharynx mildly erythematous. Neck: Supple without lymphadenopathy. Lungs: Clear to auscultation without wheezing, rhonchi or rales. CV: Normal S1, S2 without murmurs. Skin: Warm and dry, without lesions or rashes.  The following portions of the patient's history were reviewed and updated as appropriate: allergies, current medications, past family history, past medical history, past social history, past surgical history and problem list.  Allergies as of 05/23/2018   No Known Allergies     Medication List        Accurate as of 05/23/18  1:32 PM. Always use your most recent med list.          acetaminophen 500 MG tablet Commonly known as:  TYLENOL Take 1,000 mg by mouth every 6 (six) hours as needed for headache. Reported on 06/17/2016   ALLEGRA PO Take by mouth.   Azelastine-Fluticasone 137-50 MCG/ACT Susp Commonly known as:  DYMISTA Place 2 sprays into the nose 2 (two) times daily.   beclomethasone 80 MCG/ACT inhaler Commonly known as:  QVAR REDIHALER Inhale 2 puffs into the lungs 2 (two) times daily.   budesonide-formoterol 160-4.5 MCG/ACT inhaler Commonly known as:  SYMBICORT Two puffs with spacer device twice a day during asthma flares.  Rinse, gargle and spit after use.   calcium-vitamin D 500-200 MG-UNIT tablet Commonly known as:  OSCAL WITH D Take 1 tablet by mouth daily with breakfast.   cholecalciferol 1000 units tablet Commonly known as:  VITAMIN D Take 1,000 Units by mouth daily.   guaiFENesin 600 MG 12 hr tablet Commonly known as:  MUCINEX Take 600 mg by mouth 2 (two) times daily.   ibuprofen 200 MG tablet Commonly known as:  ADVIL,MOTRIN Take 600-800 mg by mouth every 6 (six) hours as needed.   levocetirizine 5 MG tablet Commonly known as:  XYZAL Take 1 tablet (5 mg  total) by mouth every evening.   metFORMIN 500 MG tablet Commonly known as:  GLUCOPHAGE Take 1,000 mg by mouth daily with breakfast.   NATURE-THROID 97.5 MG Tabs Generic drug:  Thyroid   Olopatadine HCl 0.7 % Soln Commonly known as:  PAZEO Place 1 drop into both eyes 1 day or 1 dose.   [facility] Use as directed with inhaler. Dx:  J45.40 Asthma   PROAIR HFA 108 (90 Base) MCG/ACT inhaler Generic drug:  albuterol Inhale 2 puffs into the lungs every 4 (four) hours as needed.   progesterone 200 MG capsule Commonly known as:  PROMETRIUM       No Known Allergies  I appreciate the opportunity to take part in Jacqueline Holmes's care. Please do not hesitate to contact me with questions.  Sincerely,   R. Edgar Frisk, MD

## 2018-05-26 ENCOUNTER — Telehealth: Payer: Self-pay | Admitting: Family Medicine

## 2018-05-26 NOTE — Telephone Encounter (Signed)
We received request for mammo and pap back from [facility] back. They state we are not listed as her pcp. Pt will need to sign a medical records request at next visit.

## 2018-05-26 NOTE — Telephone Encounter (Signed)
This was sent to me, thought you might want to see this.

## 2018-05-26 NOTE — Telephone Encounter (Signed)
Pt needs to sign ROR, and advise her GYN we are her PCP.  Please let pt know if [facility] hasn't. Thanks.  Need to close gaps in her chart.

## 2018-05-26 NOTE — Telephone Encounter (Signed)
Patient advised, she said she will stop by and sign release.

## 2018-05-31 LAB — HM DIABETES EYE EXAM

## 2018-06-01 ENCOUNTER — Encounter: Payer: Self-pay | Admitting: *Deleted

## 2018-06-01 DIAGNOSIS — J3089 Other allergic rhinitis: Secondary | ICD-10-CM | POA: Diagnosis not present

## 2018-06-01 NOTE — Progress Notes (Signed)
MAINTENANCE VIAL MADE. EXP: 06-02-19. HV

## 2018-06-09 ENCOUNTER — Ambulatory Visit (INDEPENDENT_AMBULATORY_CARE_PROVIDER_SITE_OTHER): Payer: BC Managed Care – PPO | Admitting: *Deleted

## 2018-06-09 DIAGNOSIS — J309 Allergic rhinitis, unspecified: Secondary | ICD-10-CM | POA: Diagnosis not present

## 2018-06-17 ENCOUNTER — Ambulatory Visit (INDEPENDENT_AMBULATORY_CARE_PROVIDER_SITE_OTHER): Payer: BC Managed Care – PPO

## 2018-06-17 DIAGNOSIS — J309 Allergic rhinitis, unspecified: Secondary | ICD-10-CM

## 2018-06-21 ENCOUNTER — Encounter: Payer: Self-pay | Admitting: Family Medicine

## 2018-06-21 ENCOUNTER — Ambulatory Visit: Payer: BC Managed Care – PPO | Admitting: Family Medicine

## 2018-06-21 VITALS — BP 130/86 | HR 82 | Temp 97.6°F | Wt 164.4 lb

## 2018-06-21 DIAGNOSIS — R51 Headache: Secondary | ICD-10-CM | POA: Diagnosis not present

## 2018-06-21 DIAGNOSIS — R519 Headache, unspecified: Secondary | ICD-10-CM

## 2018-06-21 MED ORDER — SUMATRIPTAN SUCCINATE 100 MG PO TABS: 100.0000 mg | ORAL_TABLET | ORAL | 0 refills | Status: DC | PRN

## 2018-06-21 NOTE — Progress Notes (Signed)
  Subjective:     Patient ID: Jacqueline Holmes, female   DOB: Nov 09, 1971, 47 y.o.   MRN: [id number]  BP 130/86 (BP Location: Left Arm, Patient Position: Sitting)   Pulse 82   Temp 97.6 F (36.4 C)   Wt 164 lb 6.4 oz (74.6 kg)   SpO2 96%   BMI 31.06 kg/m   HPI   Patient reports a 4 day history of a headache in the left occipital area. Woke up Friday morning with a constant dull ache with occasional throbbing that has stayed consistent. Rates pain 3/10. Patient has tried Ibuprofen 1000mg  q4 hours which decreases pain slightly, no relief with Excedrin. Associated with left ear pressure. Moving makes it worse. Denies phonophobia, photophobia, eye pain or tearing. 30+ years ago she was dx with cluster headaches. Was on imtrex then which helped. Hx asthma, allergic rhinitis, thyroid removal and prediabetes. Denies smoking or drug use. Occasional alcohol.   Review of Systems  Constitutional: Negative for chills and fatigue.  HENT: Positive for congestion and ear pain. Negative for sinus pain and tinnitus.   Eyes: Negative for discharge.  Respiratory: Negative for cough and shortness of breath.   Cardiovascular: Negative for chest pain.  Gastrointestinal: Positive for nausea. Negative for abdominal pain and vomiting.  Genitourinary: Negative for difficulty urinating and dysuria.  Musculoskeletal: Negative.   Skin: Negative.   Neurological: Positive for light-headedness and headaches. Negative for dizziness, syncope and numbness.  Psychiatric/Behavioral: Negative.        Objective:   Physical Exam  Constitutional: She is oriented to person, place, and time. She appears well-developed and well-nourished.  HENT:  Head: Normocephalic.  Mild pain with palpation, no rash  Eyes: Pupils are equal, round, and reactive to light. EOM are normal.  Cardiovascular: Normal rate and regular rhythm.  Pulmonary/Chest: Effort normal and breath sounds normal.  Musculoskeletal: Normal range of motion. She  exhibits no deformity.  Neurological: She is alert and oriented to person, place, and time. She has normal reflexes.  Reflex Scores:      Patellar reflexes are 2+ on the left side. Skin: Skin is warm and dry.  Psychiatric: She has a normal mood and affect.       Assessment & Plan    Nonintractable headache, unspecified chronicity pattern, unspecified headache type - Plan: SUMAtriptan (IMITREX) 100 MG tablet Her symptoms are not clear-cut but I think it is worthwhile trying Imitrex to see if it will help especially since she is tried this in the past.  The symptoms do not sound like a cluster type headache.  She will call back and let us know how she is doing  I

## 2018-06-28 ENCOUNTER — Encounter: Payer: Self-pay | Admitting: Family Medicine

## 2018-06-29 ENCOUNTER — Encounter: Payer: Self-pay | Admitting: *Deleted

## 2018-07-01 ENCOUNTER — Ambulatory Visit (INDEPENDENT_AMBULATORY_CARE_PROVIDER_SITE_OTHER): Payer: BC Managed Care – PPO

## 2018-07-01 DIAGNOSIS — J309 Allergic rhinitis, unspecified: Secondary | ICD-10-CM

## 2018-07-08 ENCOUNTER — Ambulatory Visit (INDEPENDENT_AMBULATORY_CARE_PROVIDER_SITE_OTHER): Payer: BC Managed Care – PPO

## 2018-07-08 DIAGNOSIS — J309 Allergic rhinitis, unspecified: Secondary | ICD-10-CM

## 2018-07-11 ENCOUNTER — Ambulatory Visit (INDEPENDENT_AMBULATORY_CARE_PROVIDER_SITE_OTHER): Payer: BC Managed Care – PPO

## 2018-07-11 DIAGNOSIS — J3089 Other allergic rhinitis: Secondary | ICD-10-CM | POA: Diagnosis not present

## 2018-08-05 ENCOUNTER — Ambulatory Visit (INDEPENDENT_AMBULATORY_CARE_PROVIDER_SITE_OTHER): Payer: BC Managed Care – PPO

## 2018-08-05 DIAGNOSIS — J309 Allergic rhinitis, unspecified: Secondary | ICD-10-CM

## 2018-08-11 ENCOUNTER — Other Ambulatory Visit: Payer: Self-pay | Admitting: Allergy and Immunology

## 2018-08-11 DIAGNOSIS — J3089 Other allergic rhinitis: Secondary | ICD-10-CM

## 2018-08-11 DIAGNOSIS — H1013 Acute atopic conjunctivitis, bilateral: Secondary | ICD-10-CM

## 2018-08-12 ENCOUNTER — Ambulatory Visit (INDEPENDENT_AMBULATORY_CARE_PROVIDER_SITE_OTHER): Payer: BC Managed Care – PPO

## 2018-08-12 DIAGNOSIS — J309 Allergic rhinitis, unspecified: Secondary | ICD-10-CM | POA: Diagnosis not present

## 2018-08-26 ENCOUNTER — Ambulatory Visit (INDEPENDENT_AMBULATORY_CARE_PROVIDER_SITE_OTHER): Payer: BC Managed Care – PPO

## 2018-08-26 DIAGNOSIS — J309 Allergic rhinitis, unspecified: Secondary | ICD-10-CM | POA: Diagnosis not present

## 2018-08-30 ENCOUNTER — Encounter: Payer: Self-pay | Admitting: Family Medicine

## 2018-08-30 ENCOUNTER — Ambulatory Visit: Payer: BC Managed Care – PPO | Admitting: Family Medicine

## 2018-08-30 VITALS — BP 130/86 | HR 100 | Ht 61.5 in | Wt 164.6 lb

## 2018-08-30 DIAGNOSIS — R519 Headache, unspecified: Secondary | ICD-10-CM

## 2018-08-30 DIAGNOSIS — Z23 Encounter for immunization: Secondary | ICD-10-CM | POA: Diagnosis not present

## 2018-08-30 DIAGNOSIS — R51 Headache: Secondary | ICD-10-CM | POA: Diagnosis not present

## 2018-08-30 MED ORDER — METHYLPREDNISOLONE ACETATE 80 MG/ML IJ SUSP
80.0000 mg | Freq: Once | INTRAMUSCULAR | Status: AC
  Administered 2018-08-30: 80 mg via INTRAMUSCULAR

## 2018-08-30 MED ORDER — METAXALONE 800 MG PO TABS: 400.0000 mg | ORAL_TABLET | Freq: Three times a day (TID) | ORAL | 0 refills | Status: DC | PRN

## 2018-08-30 NOTE — Patient Instructions (Signed)
We are giving you a steroid injection to help with the headache/pain (might also help with allergies). I do think there may be a muscular component, so will also try a muscle relaxant as needed.  You may use tylenol. You may not need ibuprofen with the steroid on board. If you do take ibuprofen, don't exceed 12 pills/day (800mg  three times daily, #12 of over-the-counter ibuprofen).  If you have persistent headaches, we may need to refer you to the neurologist. If you develop any numbness, tingling, weakness, trouble thinking/speaking or other neurologic symptoms, please seek medical care. Imaging might be needed.

## 2018-08-30 NOTE — Progress Notes (Signed)
Chief Complaint  Patient presents with  . Headache    has had current HA x 7 days. HA is in the back of her head. Saw [facility] in July for the same type of HA. Never completely goes away-takes IBU for the pain. This past Sunday am head was pounding and heart was racing-had some drinks the night before (many), took IBU 5am HA woke her up from her sleep and the heart racing.    Patient presents with HA x 7 days. Currently 2/10 pain  7 days ago woke up with headache, took ibuprofen, never completely went away.  By Thursday is was "pretty bad", felt exhausted, it was "wearing on me".  She took ibuprofen 600-800mg  three to four times/day.  She stopped this on Friday. Saturday night had "a little bit to drink", woke up Sunday morning with head pounding in the same spot her head had been hurting.  Took ibuprofen, able to go back to sleep. Went from 10 to 5-6/10.  Yesterday was 3-4/10.  Headache pain is on the left side of the back of her head. This doesn't feel like migraines or other headaches she has had in the past. Doesn't feel like it is related to muscles are stress. Heating pad wasn't helpful. No lack of sleep, stress changes, or other triggers for this headache. Intermittent mild nausea. No photo/phonophobia.  She saw Dr. Redmond School in July with similar headache, was prescribed imitrex 100mg .  Imitrex wasn't helpful (tried it in July); didn't try with this headache.  Caffeine--no recent changes.  Drinks 1 cup of coffee 2/week, soda 2x/week or less.  Asthma and allergies controlled, not flaring.  PMH, PSH, SH reviewed  Outpatient Encounter Medications as of 08/30/2018  Medication Sig Note  . beclomethasone (QVAR REDIHALER) 80 MCG/ACT inhaler Inhale 2 puffs into the lungs 2 (two) times daily.   . cholecalciferol (VITAMIN D) 1000 UNITS tablet Take 1,000 Units by mouth daily.    Marland Kitchen Fexofenadine HCl (ALLEGRA PO) Take by mouth.   . metFORMIN (GLUCOPHAGE) 500 MG tablet Take 1,000 mg by mouth daily with  breakfast.  04/15/2016: Takes extended release 500mg , 2 tablets every morning  . NATURE-THROID 97.5 MG TABS    . progesterone (PROMETRIUM) 200 MG capsule    . Spacer/Aero-Holding Chambers [facility] DIAMOND) DEVI Use as directed with inhaler. Dx:  J45.40 Asthma   . acetaminophen (TYLENOL) 500 MG tablet Take 1,000 mg by mouth every 6 (six) hours as needed for headache. Reported on 06/17/2016 05/02/2014: Prn   . Azelastine-Fluticasone (DYMISTA) 137-50 MCG/ACT SUSP Place 2 sprays into the nose 2 (two) times daily. (Patient not taking: Reported on 08/30/2018) 08/30/2018: Makes things worse, so stopped using  . budesonide-formoterol (SYMBICORT) 160-4.5 MCG/ACT inhaler Two puffs with spacer device twice a day during asthma flares.  Rinse, gargle and spit after use. (Patient not taking: Reported on 08/30/2018) 05/16/2018: Uses prn with flares  . calcium-vitamin D (OSCAL WITH D) 500-200 MG-UNIT per tablet Take 1 tablet by mouth daily with breakfast.   . guaiFENesin (MUCINEX) 600 MG 12 hr tablet Take 600 mg by mouth 2 (two) times daily.   Marland Kitchen ibuprofen (ADVIL,MOTRIN) 200 MG tablet Take 600-800 mg by mouth every 6 (six) hours as needed.   Marland Kitchen levocetirizine (XYZAL) 5 MG tablet TAKE 1 TABLET ONCE DAILY IN THE EVENING. (Patient not taking: Reported on 08/30/2018)   . Olopatadine HCl (PAZEO) 0.7 % SOLN Place 1 drop into both eyes 1 day or 1 dose. (Patient not taking: Reported on 08/30/2018)   .  PROAIR HFA 108 (90 Base) MCG/ACT inhaler Inhale 2 puffs into the lungs every 4 (four) hours as needed. (Patient not taking: Reported on 08/30/2018)   . SUMAtriptan (IMITREX) 100 MG tablet Take 1 tablet (100 mg total) by mouth every 2 (two) hours as needed for migraine. May repeat in 2 hours if headache persists or recurs. (Patient not taking: Reported on 08/30/2018)    No facility-administered encounter medications on file as of 08/30/2018.    No Known Allergies  ROS: no fever, chills, URI symptoms, dizziness, numbness, tingling,  nausea, vomiting, vision changes, weakness, balance problems.  No shortness of breath, chest pain, rashes, bleeding, bruising or other concerns.   PHYSICAL EXAM:  BP 130/86   Pulse 100   Ht 5' 1.5" (1.562 m)   Wt 164 lb 9.6 oz (74.7 kg)   BMI 30.60 kg/m   Well-appearing, pleasant female, in no distress. HEENT: conjunctiva and sclera are clear, EOMI, fundi benign. Nasal mucosa is mildly edematous, with clear mucus on the left. Sinuses are nontender.  TM partially blocked by soft cerumen on the left. Visualized portion is normal. TM and EAC on the right is normal.  OP is clear Tender at the left occiput posteriorly (not inferiorly, above the base of the skull).  No swelling or focal abnormality Neck: no lymphadenopathy, thyromegaly or mass. She does have tight muscle which is tender at left paraspinous muscle in neck. nontender where this muscle hits the skull. Neuro: alert and oriented, cranial nerves intact. Normal finger to nose, no pronator drift. Normal strength, sensation. DTR's are 2+ and symmetric. Normal gait.  ASSESSMENT/PLAN:  Occipital headache - normal neuro exam, tenderness is superficial; some neck muscular pain. Treat with steroids, muscle relaxants. May need neuro referral if persistent - Plan: metaxalone (SKELAXIN) 800 MG tablet, methylPREDNISolone acetate (DEPO-MEDROL) injection 80 mg  Need for influenza vaccination - Plan: Flu Vaccine QUAD 6+ mos PF IM (Fluarix Quad PF)  Risks/side effects of meds reviewed in detail.   We are giving you a steroid injection to help with the headache/pain (might also help with allergies). I do think there may be a muscular component, so will also try a muscle relaxant as needed.  You may use tylenol. You may not need ibuprofen with the steroid on board. If you do take ibuprofen, don't exceed 12 pills/day (800mg  three times daily, #12 of over-the-counter ibuprofen).  If you have persistent headaches, we may need to refer you to the  neurologist. If you develop any numbness, tingling, weakness, trouble thinking/speaking or other neurologic symptoms, please seek medical care. Imaging might be needed.

## 2018-09-02 ENCOUNTER — Encounter: Payer: Self-pay | Admitting: Obstetrics and Gynecology

## 2018-09-02 ENCOUNTER — Ambulatory Visit: Payer: BC Managed Care – PPO | Admitting: Obstetrics and Gynecology

## 2018-09-02 ENCOUNTER — Other Ambulatory Visit: Payer: Self-pay

## 2018-09-02 ENCOUNTER — Other Ambulatory Visit (HOSPITAL_COMMUNITY)
Admission: RE | Admit: 2018-09-02 | Discharge: 2018-09-02 | Disposition: A | Discharge status: Home / Self Care | Payer: BC Managed Care – PPO | Source: Ambulatory Visit | Attending: Obstetrics and Gynecology | Admitting: Obstetrics and Gynecology

## 2018-09-02 VITALS — BP 132/82 | HR 115 | Ht 61.0 in | Wt 163.0 lb

## 2018-09-02 DIAGNOSIS — Z01419 Encounter for gynecological examination (general) (routine) without abnormal findings: Secondary | ICD-10-CM | POA: Diagnosis not present

## 2018-09-02 NOTE — Patient Instructions (Addendum)
EXERCISE AND DIET:  We recommended that you start or continue a regular exercise program for good health. Regular exercise means any activity that makes your heart beat faster and makes you sweat.  We recommend exercising at least 30 minutes per day at least 3 days a week, preferably 4 or 5.  We also recommend a diet low in fat and sugar.  Inactivity, poor dietary choices and obesity can cause diabetes, heart attack, stroke, and kidney damage, among others.    ALCOHOL AND SMOKING:  Women should limit their alcohol intake to no more than 7 drinks/beers/glasses of wine (combined, not each!) per week. Moderation of alcohol intake to this level decreases your risk of breast cancer and liver damage. And of course, no recreational drugs are part of a healthy lifestyle.  And absolutely no smoking or even second hand smoke. Most people know smoking can cause heart and lung diseases, but did you know it also contributes to weakening of your bones? Aging of your skin?  Yellowing of your teeth and nails?  CALCIUM AND VITAMIN D:  Adequate intake of calcium and Vitamin D are recommended.  The recommendations for exact amounts of these supplements seem to change often, but generally speaking 600 mg of calcium (either carbonate or citrate) and 800 units of Vitamin D per day seems prudent. Certain women may benefit from higher intake of Vitamin D.  If you are among these women, your doctor will have told you during your visit.    PAP SMEARS:  Pap smears, to check for cervical cancer or precancers,  have traditionally been done yearly, although recent scientific advances have shown that most women can have pap smears less often.  However, every woman still should have a physical exam from her gynecologist every year. It will include a breast check, inspection of the vulva and vagina to check for abnormal growths or skin changes, a visual exam of the cervix, and then an exam to evaluate the size and shape of the uterus and  ovaries.  And after 48 years of age, a rectal exam is indicated to check for rectal cancers. We will also provide age appropriate advice regarding health maintenance, like when you should have certain vaccines, screening for sexually transmitted diseases, bone density testing, colonoscopy, mammograms, etc.   MAMMOGRAMS:  All women over 40 years old should have a yearly mammogram. Many facilities now offer a "3D" mammogram, which may cost around $50 extra out of pocket. If possible,  we recommend you accept the option to have the 3D mammogram performed.  It both reduces the number of women who will be called back for extra views which then turn out to be normal, and it is better than the routine mammogram at detecting truly abnormal areas.    COLONOSCOPY:  Colonoscopy to screen for colon cancer is recommended for all women at age 50.  We know, you hate the idea of the prep.  We agree, BUT, having colon cancer and not knowing it is worse!!  Colon cancer so often starts as a polyp that can be seen and removed at colonscopy, which can quite literally save your life!  And if your first colonoscopy is normal and you have no family history of colon cancer, most women don't have to have it again for 10 years.  Once every ten years, you can do something that may end up saving your life, right?  We will be happy to help you get it scheduled when you are ready.    Be sure to check your insurance coverage so you understand how much it will cost.  It may be covered as a preventative service at no cost, but you should check your particular policy.      Kegel Exercises Kegel exercises help strengthen the muscles that support the rectum, vagina, small intestine, bladder, and uterus. Doing Kegel exercises can help:  Improve bladder and bowel control.  Improve sexual response.  Reduce problems and discomfort during pregnancy.  Kegel exercises involve squeezing your pelvic floor muscles, which are the same muscles you  squeeze when you try to stop the flow of urine. The exercises can be done while sitting, standing, or lying down, but it is best to vary your position. Phase 1 exercises 1. Squeeze your pelvic floor muscles tight. You should feel a tight lift in your rectal area. If you are a female, you should also feel a tightness in your vaginal area. Keep your stomach, buttocks, and legs relaxed. 2. Hold the muscles tight for up to 10 seconds. 3. Relax your muscles. Repeat this exercise 50 times a day or as many times as told by your health care provider. Continue to do this exercise for at least 4-6 weeks or for as long as told by your health care provider. This information is not intended to replace advice given to you by your health care provider. Make sure you discuss any questions you have with your health care provider. Document Released: 11/02/2012 Document Revised: 07/11/2016 Document Reviewed: 10/06/2015 Elsevier Interactive Patient Education  2018 Elsevier Inc.  

## 2018-09-02 NOTE — Progress Notes (Signed)
47 y.o. G58P1011 Married Caucasian female here for annual exam.    Menses stopped at age 56.  Was having hot flashes and she saw her provider who manages her thyroid. Did take estrogen and progesterone for a period of time.  Now she is doing testosterone pellet inserts and oral HRT through [facility].  Happy with hormonal balance.  No spotting.   Would like a breast reduction.  Has seen Dr. Harlow Mares. She has breast pain with back and shoulder pain since she was a teenager. Tried several different bras and has the straps diggging in her shoulders.  Has tried multiple pain relievers.  Has tried massages which gives only short term relief. She also has heat rash under her breast.  Has urinary incontinence.  Some urgency.  Double voiding.  Leaks with cough, laugh, sneeze. Can wear a pad.   Son age 67.  [id number]:[id number], Eve, MD      Patient's last menstrual period was 01/28/2017.     Period Cycle (Days): (postmenopausal)     Sexually active: Yes.    The current method of family planning is none.    Exercising: Yes.    Home exercise routine includes walking 2 miles 3-4 times a week. Smoker:  no  Health Maintenance: Pap:  05/2017 normal per patient History of abnormal Pap:  Yes, years ago per patient thinks a biopsy was done MMG:  05/2017 [facility].  Will do at [facility] this afternoon.  Colonoscopy:  Never.  Did IFOB with PCP, and it was negative.  BMD:   never  Result  n/a TDaP:  04/2018 Gardasil:   no [id number] Hep C: never Screening Labs:  Hb today: n/a, Urine today: n/a Flu vaccine done yesterday.    reports that she has never smoked. She has never used smokeless tobacco. She reports that she drinks alcohol. She reports that she does not use drugs.  Past Medical History:  Diagnosis Date  . Angio-edema   . Asthma   . Cough   . Diabetes mellitus arising in pregnancy    now pre-diabetes  . Family history of breast cancer    MGM  . Fatigue   .  Generalized headaches    migraines on occasion.  Berenice Primas disease   . Hyperlipidemia   . Hypothyroidism    s/p thyroidectomy for Grave's disease (Dr. Tye Savoy at [facility])  . IBS (irritable bowel syndrome)   . Incontinence   . Migraine    without aura  . Night sweats   . Recurrent upper respiratory infection (URI)   . Sinus problem    runny nose  . SOB (shortness of breath)   . Urticaria     Past Surgical History:  Procedure Laterality Date  . ABDOMINOPLASTY  12/2015   Dr. Towanda Malkin  . CESAREAN SECTION    . DILATATION & CURETTAGE/HYSTEROSCOPY WITH TRUECLEAR N/A 01/16/2014   Procedure: DILATATION & CURETTAGE/HYSTEROSCOPY WITH TRUCLEAR;  Surgeon: Marylynn Pearson, MD;  Location: [facility];  Service: Gynecology;  Laterality: N/A;  . LASIK    . TOTAL THYROIDECTOMY  07/30/11   Dr. Harlow Asa (Grave's disease)  . VULVA Milagros Loll BIOPSY N/A 01/16/2014   Procedure: VULVAR BIOPSY;  Surgeon: Marylynn Pearson, MD;  Location: [facility];  Service: Gynecology;  Laterality: N/A;    Current Outpatient Medications  Medication Sig Dispense Refill  . acetaminophen (TYLENOL) 500 MG tablet Take 1,000 mg by mouth every 6 (six) hours as needed for headache. Reported on 06/17/2016    .  ASHWAGANDHA PO Take 300 mg by mouth 2 (two) times daily.    Jolyne Loa Grape-Goldenseal (BERBERINE COMPLEX PO) Take by mouth 2 (two) times daily.    . beclomethasone (QVAR REDIHALER) 80 MCG/ACT inhaler Inhale 2 puffs into the lungs 2 (two) times daily. 10.6 g 5  . calcium-vitamin D (OSCAL WITH D) 500-200 MG-UNIT per tablet Take 1 tablet by mouth daily with breakfast.    . cholecalciferol (VITAMIN D) 1000 UNITS tablet Take 1,000 Units by mouth daily.     Marland Kitchen Fexofenadine HCl (ALLEGRA PO) Take by mouth.    Marland Kitchen guaiFENesin (MUCINEX) 600 MG 12 hr tablet Take 600 mg by mouth 2 (two) times daily.    Marland Kitchen ibuprofen (ADVIL,MOTRIN) 200 MG tablet Take 600-800 mg by mouth every 6 (six) hours as needed.    Marland Kitchen levocetirizine (XYZAL) 5  MG tablet TAKE 1 TABLET ONCE DAILY IN THE EVENING. 30 tablet 2  . metaxalone (SKELAXIN) 800 MG tablet Take 0.5-1 tablets (400-800 mg total) by mouth 3 (three) times daily as needed for muscle spasms. 20 tablet 0  . metFORMIN (GLUCOPHAGE) 1000 MG tablet Take 1,000 mg by mouth daily.    Marland Kitchen NATURE-THROID 97.5 MG TABS     . NON FORMULARY 306 mg daily. Estrogen Control    . NON FORMULARY     . NON FORMULARY     . Pregnenolone POWD 200 mg by Does not apply route daily.    . progesterone (PROMETRIUM) 200 MG capsule     . Spacer/Aero-Holding Chambers [facility] DIAMOND) DEVI Use as directed with inhaler. Dx:  J45.40 Asthma 1 Device 2  . budesonide-formoterol (SYMBICORT) 160-4.5 MCG/ACT inhaler Two puffs with spacer device twice a day during asthma flares.  Rinse, gargle and spit after use. (Patient not taking: Reported on 08/30/2018) 1 Inhaler 5   No current facility-administered medications for this visit.     Family History  Problem Relation Age of Onset  . Cancer Father        prostate  . Allergic rhinitis Mother   . Asthma Mother   . Alpha-1 antitrypsin deficiency Mother   . COPD Mother   . Cancer Maternal Grandmother        pancreatic  . Diabetes Maternal Grandmother   . Heart disease Maternal Grandfather        CABG in 60's  . Diabetes Maternal Grandfather   . Diabetes Paternal Grandmother   . Diabetes Paternal Grandfather   . Heart disease Paternal Grandfather   . Angioedema Neg Hx   . Eczema Neg Hx     Review of Systems  Constitutional: Negative.   HENT: Negative.   Eyes: Negative.   Respiratory: Negative.   Cardiovascular: Negative.   Gastrointestinal: Negative.   Endocrine: Negative.   Genitourinary: Negative.   Musculoskeletal: Positive for back pain.  Skin: Negative.   Allergic/Immunologic: Negative.   Neurological: Negative.   Hematological: Negative.   Psychiatric/Behavioral: Negative.   All other systems reviewed and are negative.   Exam:   BP 132/82    Pulse (!) 115   Ht 5\' 1"  (1.549 m)   Wt 163 lb (73.9 kg)   LMP 01/28/2017   SpO2 95%   BMI 30.80 kg/m     General appearance: alert, cooperative and appears stated age Head: Normocephalic, without obvious abnormality, atraumatic Neck: no adenopathy, supple, symmetrical, trachea midline and thyroid normal to inspection and palpation Lungs: clear to auscultation bilaterally Breasts: normal appearance, no masses or tenderness, No nipple retraction or dimpling,  No nipple discharge or bleeding, No axillary or supraclavicular adenopathy Heart: regular rate and rhythm Abdomen: soft, non-tender; no masses, no organomegaly Extremities: extremities normal, atraumatic, no cyanosis or edema Skin: Skin color, texture, turgor normal. No rashes or lesions Lymph nodes: Cervical, supraclavicular, and axillary nodes normal. No abnormal inguinal nodes palpated Neurologic: Grossly normal  Pelvic: External genitalia:  no lesions              Urethra:  normal appearing urethra with no masses, tenderness or lesions              Bartholins and Skenes: normal                 Vagina: normal appearing vagina with normal color and discharge, no lesions              Cervix: no lesions              Pap taken: Yes.   Bimanual Exam:  Uterus:  normal size, contour, position, consistency, mobility, non-tender              Adnexa: no mass, fullness, tenderness              Rectal exam: Yes.  .  Confirms.              Anus:  normal sphincter tone, no lesions  Chaperone was present for exam.  Assessment:   Well woman visit with normal exam. Menopausal female. On HRT.  This includes testosterone pellets. Macromastic with mastodynia. Migraines without aura. Status post thyroidectomy.  Graves.  Prediabetic. Mild GSI.  Plan: Mammogram screening. Recommended self breast awareness. Pap and HR HPV as above. Guidelines for Calcium, Vitamin D, regular exercise program including cardiovascular and weight bearing  exercise. HRT through [facility]. Kegel's reviewed.  Pelvic floor therapy mentioned.  She declines surgery for urinary incontinence.  Labs through PCP.  Follow up annually and prn.    After visit summary provided.

## 2018-09-06 LAB — CYTOLOGY - PAP
Diagnosis: NEGATIVE
HPV: NOT DETECTED

## 2018-09-09 ENCOUNTER — Ambulatory Visit (INDEPENDENT_AMBULATORY_CARE_PROVIDER_SITE_OTHER): Payer: BC Managed Care – PPO

## 2018-09-09 DIAGNOSIS — J309 Allergic rhinitis, unspecified: Secondary | ICD-10-CM

## 2018-09-15 ENCOUNTER — Ambulatory Visit (INDEPENDENT_AMBULATORY_CARE_PROVIDER_SITE_OTHER): Payer: BC Managed Care – PPO | Admitting: *Deleted

## 2018-09-15 DIAGNOSIS — J309 Allergic rhinitis, unspecified: Secondary | ICD-10-CM | POA: Diagnosis not present

## 2018-09-22 ENCOUNTER — Ambulatory Visit (INDEPENDENT_AMBULATORY_CARE_PROVIDER_SITE_OTHER): Payer: BC Managed Care – PPO

## 2018-09-22 DIAGNOSIS — J309 Allergic rhinitis, unspecified: Secondary | ICD-10-CM | POA: Diagnosis not present

## 2018-09-30 ENCOUNTER — Ambulatory Visit (INDEPENDENT_AMBULATORY_CARE_PROVIDER_SITE_OTHER): Payer: BC Managed Care – PPO | Admitting: *Deleted

## 2018-09-30 DIAGNOSIS — J309 Allergic rhinitis, unspecified: Secondary | ICD-10-CM

## 2018-10-06 ENCOUNTER — Ambulatory Visit (INDEPENDENT_AMBULATORY_CARE_PROVIDER_SITE_OTHER): Payer: BC Managed Care – PPO | Admitting: *Deleted

## 2018-10-06 DIAGNOSIS — J309 Allergic rhinitis, unspecified: Secondary | ICD-10-CM | POA: Diagnosis not present

## 2018-10-17 ENCOUNTER — Encounter: Payer: Self-pay | Admitting: Allergy and Immunology

## 2018-10-17 ENCOUNTER — Ambulatory Visit: Payer: BC Managed Care – PPO | Admitting: Allergy and Immunology

## 2018-10-17 ENCOUNTER — Ambulatory Visit: Payer: Self-pay | Admitting: *Deleted

## 2018-10-17 VITALS — BP 110/70 | HR 86 | Temp 97.5°F | Ht 60.5 in | Wt 154.2 lb

## 2018-10-17 DIAGNOSIS — J4530 Mild persistent asthma, uncomplicated: Secondary | ICD-10-CM | POA: Diagnosis not present

## 2018-10-17 DIAGNOSIS — J3089 Other allergic rhinitis: Secondary | ICD-10-CM

## 2018-10-17 MED ORDER — IPRATROPIUM BROMIDE 0.03 % NA SOLN: NASAL | 5 refills | Status: DC

## 2018-10-17 NOTE — Progress Notes (Signed)
Follow-up Note  RE: Jacqueline Holmes MRN: [id number] DOB: [birth year] Date of Office Visit: 10/17/2018  Primary care provider: [name], MD Referring provider: [name], MD  History of present illness: Jacqueline Holmes is a 47 y.o. female with persistent asthma and allergic rhinitis on immunotherapy presenting today for follow-up.  She reports that her asthma has been well controlled with the Qvar RediHaler 80 g, 2 inhalations daily.  She has Symbicort on hand for burst therapy during respiratory tract infections and asthma flares.  She continues to experience nasal congestion as well as occasional postnasal drainage and hoarseness.  She had to discontinue Dymista because of a burning sensation in her nose and states that it is for this reason that she has to discontinue most medicated nasal sprays.  She is receiving Aeroallergen immunotherapy injections without problems or complications.  She has noted some degree of symptom reduction while on immunotherapy.  Assessment and plan: Mild persistent asthma, uncomplicated Currently well controlled.  Continue Qvar 80 g, 2 inhalations twice daily, and albuterol HFA, 1 to 2 inhalations every 4-6 hours if needed.  During upper respiratory tract infections and/or asthma flares, switch to Symbicort 160/4.5 g, 2 inhalations via spacer device twice daily until symptoms have returned to baseline.  Subjective and objective measures of pulmonary function will be followed and the treatment plan will be adjusted accordingly.  Allergic rhinitis  Continue appropriate allergen avoidance measures and immunotherapy injections as prescribed.  A prescription has been provided for ipratropium nasal spray 0.06%, 2 sprays per nostril every 8 hours if needed.   Meds ordered this encounter  Medications  . ipratropium (ATROVENT) 0.03 % nasal spray    Sig: 2 sprays per nostril every 8 hours if needed    Dispense:  30 mL    Refill:  5     Diagnostics: Spirometry:  Normal with an FEV1 of 87% predicted.  Please see scanned spirometry results for details.      Physical examination: Blood pressure 110/70, pulse 86, temperature (!) 97.5 F (36.4 C), temperature source Oral, height 5' 0.5" (1.537 m), weight 154 lb 3.2 oz (69.9 kg), SpO2 96 %.  General: Alert, interactive, in no acute distress. HEENT: TMs pearly gray, turbinates moderately edematous without discharge, post-pharynx mildly erythematous. Neck: Supple without lymphadenopathy. Lungs: Clear to auscultation without wheezing, rhonchi or rales. CV: Normal S1, S2 without murmurs. Skin: Warm and dry, without lesions or rashes.  The following portions of the patient's history were reviewed and updated as appropriate: allergies, current medications, past family history, past medical history, past social history, past surgical history and problem list.  Allergies as of 10/17/2018   No Known Allergies     Medication List        Accurate as of 10/17/18 12:43 PM. Always use your most recent med list.          acetaminophen 500 MG tablet Commonly known as:  TYLENOL Take 1,000 mg by mouth every 6 (six) hours as needed for headache. Reported on 06/17/2016   ALLEGRA PO Take by mouth.   ASHWAGANDHA PO Take 300 mg by mouth 2 (two) times daily.   beclomethasone 80 MCG/ACT inhaler Commonly known as:  QVAR Inhale 2 puffs into the lungs 2 (two) times daily.   BERBERINE COMPLEX PO Take by mouth 2 (two) times daily.   budesonide-formoterol 160-4.5 MCG/ACT inhaler Commonly known as:  SYMBICORT Two puffs with spacer device twice a day during asthma flares.  Rinse, gargle and spit after use.  calcium-vitamin D 500-200 MG-UNIT tablet Commonly known as:  OSCAL WITH D Take 1 tablet by mouth daily with breakfast.   cholecalciferol 1000 units tablet Commonly known as:  VITAMIN D Take 1,000 Units by mouth daily.   guaiFENesin 600 MG 12 hr tablet Commonly known as:   MUCINEX Take 600 mg by mouth 2 (two) times daily.   ibuprofen 200 MG tablet Commonly known as:  ADVIL,MOTRIN Take 600-800 mg by mouth every 6 (six) hours as needed.   ipratropium 0.03 % nasal spray Commonly known as:  ATROVENT 2 sprays per nostril every 8 hours if needed   levocetirizine 5 MG tablet Commonly known as:  XYZAL TAKE 1 TABLET ONCE DAILY IN THE EVENING.   metFORMIN 1000 MG tablet Commonly known as:  GLUCOPHAGE Take 1,000 mg by mouth daily.   NATURE-THROID 97.5 MG Tabs Generic drug:  Thyroid   NON FORMULARY 306 mg daily. Estrogen Control   NON FORMULARY   NON FORMULARY   NON FORMULARY   OPTICHAMBER DIAMOND Devi Use as directed with inhaler. Dx:  J45.40 Asthma   Pregnenolone Powd 200 mg by Does not apply route daily.   progesterone 200 MG capsule Commonly known as:  PROMETRIUM       No Known Allergies  I appreciate the opportunity to take part in Jacqueline Holmes's care. Please do not hesitate to contact me with questions.  Sincerely,   R. Edgar Frisk, MD

## 2018-10-17 NOTE — Assessment & Plan Note (Signed)
Currently well controlled.  Continue Qvar 80 g, 2 inhalations twice daily, and albuterol HFA, 1 to 2 inhalations every 4-6 hours if needed.  During upper respiratory tract infections and/or asthma flares, switch to Symbicort 160/4.5 g, 2 inhalations via spacer device twice daily until symptoms have returned to baseline.  Subjective and objective measures of pulmonary function will be followed and the treatment plan will be adjusted accordingly.

## 2018-10-17 NOTE — Patient Instructions (Addendum)
Mild persistent asthma, uncomplicated Currently well controlled.  Continue Qvar 80 g, 2 inhalations twice daily, and albuterol HFA, 1 to 2 inhalations every 4-6 hours if needed.  During upper respiratory tract infections and/or asthma flares, switch to Symbicort 160/4.5 g, 2 inhalations via spacer device twice daily until symptoms have returned to baseline.  Subjective and objective measures of pulmonary function will be followed and the treatment plan will be adjusted accordingly.  Allergic rhinitis  Continue appropriate allergen avoidance measures and immunotherapy injections as prescribed.  A prescription has been provided for ipratropium nasal spray 0.06%, 2 sprays per nostril every 8 hours if needed.   Return in about 6-12 months, or if symptoms worsen or fail to improve.

## 2018-10-17 NOTE — Assessment & Plan Note (Signed)
   Continue appropriate allergen avoidance measures and immunotherapy injections as prescribed.  A prescription has been provided for ipratropium nasal spray 0.06%, 2 sprays per nostril every 8 hours if needed.

## 2018-10-30 HISTORY — PX: BREAST SURGERY: SHX581

## 2018-11-01 ENCOUNTER — Ambulatory Visit (INDEPENDENT_AMBULATORY_CARE_PROVIDER_SITE_OTHER): Payer: BC Managed Care – PPO | Admitting: *Deleted

## 2018-11-01 DIAGNOSIS — J309 Allergic rhinitis, unspecified: Secondary | ICD-10-CM

## 2018-11-03 ENCOUNTER — Other Ambulatory Visit: Payer: Self-pay | Admitting: Plastic Surgery

## 2018-11-03 HISTORY — PX: BREAST REDUCTION SURGERY: SHX8

## 2018-11-08 NOTE — Progress Notes (Signed)
VIALS EXP 11-09-19

## 2018-11-09 DIAGNOSIS — J301 Allergic rhinitis due to pollen: Secondary | ICD-10-CM

## 2018-11-14 ENCOUNTER — Encounter: Payer: Self-pay | Admitting: Obstetrics and Gynecology

## 2018-11-17 ENCOUNTER — Other Ambulatory Visit: Payer: Self-pay | Admitting: Allergy and Immunology

## 2018-11-17 DIAGNOSIS — J3089 Other allergic rhinitis: Secondary | ICD-10-CM

## 2018-11-17 DIAGNOSIS — H1013 Acute atopic conjunctivitis, bilateral: Secondary | ICD-10-CM

## 2018-11-21 ENCOUNTER — Ambulatory Visit (INDEPENDENT_AMBULATORY_CARE_PROVIDER_SITE_OTHER): Payer: BC Managed Care – PPO | Admitting: *Deleted

## 2018-11-21 DIAGNOSIS — J309 Allergic rhinitis, unspecified: Secondary | ICD-10-CM | POA: Diagnosis not present

## 2018-11-26 ENCOUNTER — Other Ambulatory Visit: Payer: Self-pay | Admitting: Allergy and Immunology

## 2018-11-26 DIAGNOSIS — J45901 Unspecified asthma with (acute) exacerbation: Secondary | ICD-10-CM

## 2018-12-02 ENCOUNTER — Ambulatory Visit (INDEPENDENT_AMBULATORY_CARE_PROVIDER_SITE_OTHER): Payer: BC Managed Care – PPO | Admitting: *Deleted

## 2018-12-02 DIAGNOSIS — J309 Allergic rhinitis, unspecified: Secondary | ICD-10-CM | POA: Diagnosis not present

## 2018-12-14 ENCOUNTER — Ambulatory Visit (INDEPENDENT_AMBULATORY_CARE_PROVIDER_SITE_OTHER): Payer: BC Managed Care – PPO | Admitting: *Deleted

## 2018-12-14 DIAGNOSIS — J309 Allergic rhinitis, unspecified: Secondary | ICD-10-CM

## 2018-12-23 ENCOUNTER — Ambulatory Visit (INDEPENDENT_AMBULATORY_CARE_PROVIDER_SITE_OTHER): Payer: BC Managed Care – PPO | Admitting: *Deleted

## 2018-12-23 DIAGNOSIS — J309 Allergic rhinitis, unspecified: Secondary | ICD-10-CM | POA: Diagnosis not present

## 2019-01-04 ENCOUNTER — Ambulatory Visit (INDEPENDENT_AMBULATORY_CARE_PROVIDER_SITE_OTHER): Payer: BC Managed Care – PPO

## 2019-01-04 DIAGNOSIS — J309 Allergic rhinitis, unspecified: Secondary | ICD-10-CM | POA: Diagnosis not present

## 2019-01-13 ENCOUNTER — Ambulatory Visit (INDEPENDENT_AMBULATORY_CARE_PROVIDER_SITE_OTHER): Payer: BC Managed Care – PPO | Admitting: *Deleted

## 2019-01-13 DIAGNOSIS — J309 Allergic rhinitis, unspecified: Secondary | ICD-10-CM

## 2019-01-19 ENCOUNTER — Ambulatory Visit (INDEPENDENT_AMBULATORY_CARE_PROVIDER_SITE_OTHER): Payer: BC Managed Care – PPO

## 2019-01-19 DIAGNOSIS — J309 Allergic rhinitis, unspecified: Secondary | ICD-10-CM

## 2019-01-27 ENCOUNTER — Ambulatory Visit (INDEPENDENT_AMBULATORY_CARE_PROVIDER_SITE_OTHER): Payer: BC Managed Care – PPO | Admitting: *Deleted

## 2019-01-27 DIAGNOSIS — J309 Allergic rhinitis, unspecified: Secondary | ICD-10-CM | POA: Diagnosis not present

## 2019-02-03 ENCOUNTER — Ambulatory Visit (INDEPENDENT_AMBULATORY_CARE_PROVIDER_SITE_OTHER): Payer: BC Managed Care – PPO | Admitting: *Deleted

## 2019-02-03 DIAGNOSIS — J309 Allergic rhinitis, unspecified: Secondary | ICD-10-CM

## 2019-02-13 ENCOUNTER — Ambulatory Visit (INDEPENDENT_AMBULATORY_CARE_PROVIDER_SITE_OTHER): Payer: BC Managed Care – PPO

## 2019-02-13 ENCOUNTER — Other Ambulatory Visit: Payer: Self-pay | Admitting: Allergy and Immunology

## 2019-02-13 DIAGNOSIS — J309 Allergic rhinitis, unspecified: Secondary | ICD-10-CM | POA: Diagnosis not present

## 2019-02-24 ENCOUNTER — Ambulatory Visit (INDEPENDENT_AMBULATORY_CARE_PROVIDER_SITE_OTHER): Payer: BC Managed Care – PPO | Admitting: *Deleted

## 2019-02-24 DIAGNOSIS — J309 Allergic rhinitis, unspecified: Secondary | ICD-10-CM

## 2019-03-09 ENCOUNTER — Ambulatory Visit (INDEPENDENT_AMBULATORY_CARE_PROVIDER_SITE_OTHER): Payer: BC Managed Care – PPO | Admitting: *Deleted

## 2019-03-09 DIAGNOSIS — J309 Allergic rhinitis, unspecified: Secondary | ICD-10-CM

## 2019-03-17 ENCOUNTER — Ambulatory Visit (INDEPENDENT_AMBULATORY_CARE_PROVIDER_SITE_OTHER): Payer: BC Managed Care – PPO | Admitting: *Deleted

## 2019-03-17 DIAGNOSIS — J309 Allergic rhinitis, unspecified: Secondary | ICD-10-CM

## 2019-03-23 ENCOUNTER — Other Ambulatory Visit: Payer: Self-pay

## 2019-03-23 DIAGNOSIS — J3089 Other allergic rhinitis: Secondary | ICD-10-CM

## 2019-03-23 DIAGNOSIS — H1013 Acute atopic conjunctivitis, bilateral: Secondary | ICD-10-CM

## 2019-03-23 MED ORDER — LEVOCETIRIZINE DIHYDROCHLORIDE 5 MG PO TABS: ORAL_TABLET | ORAL | 0 refills | Status: DC

## 2019-03-28 NOTE — Progress Notes (Signed)
Vials exp 03-27-2020

## 2019-03-29 DIAGNOSIS — J301 Allergic rhinitis due to pollen: Secondary | ICD-10-CM | POA: Diagnosis not present

## 2019-04-06 ENCOUNTER — Ambulatory Visit (INDEPENDENT_AMBULATORY_CARE_PROVIDER_SITE_OTHER): Payer: BC Managed Care – PPO

## 2019-04-06 DIAGNOSIS — J309 Allergic rhinitis, unspecified: Secondary | ICD-10-CM

## 2019-04-21 ENCOUNTER — Ambulatory Visit (INDEPENDENT_AMBULATORY_CARE_PROVIDER_SITE_OTHER): Payer: BC Managed Care – PPO | Admitting: *Deleted

## 2019-04-21 DIAGNOSIS — J309 Allergic rhinitis, unspecified: Secondary | ICD-10-CM | POA: Diagnosis not present

## 2019-04-25 ENCOUNTER — Ambulatory Visit: Payer: BC Managed Care – PPO | Admitting: Family Medicine

## 2019-04-25 ENCOUNTER — Other Ambulatory Visit: Payer: Self-pay

## 2019-04-25 ENCOUNTER — Encounter: Payer: Self-pay | Admitting: Family Medicine

## 2019-04-25 VITALS — Temp 97.1°F | Wt 148.0 lb

## 2019-04-25 DIAGNOSIS — R300 Dysuria: Secondary | ICD-10-CM | POA: Diagnosis not present

## 2019-04-25 DIAGNOSIS — R8290 Unspecified abnormal findings in urine: Secondary | ICD-10-CM | POA: Diagnosis not present

## 2019-04-25 MED ORDER — NITROFURANTOIN MONOHYD MACRO 100 MG PO CAPS: 100.0000 mg | ORAL_CAPSULE | Freq: Two times a day (BID) | ORAL | 0 refills | Status: DC

## 2019-04-25 NOTE — Progress Notes (Signed)
   Subjective:   Documentation for virtual audio and video telecommunications through [place] encounter:  The patient was located at home. 2 patient identifiers used.  The provider was located in the office. The patient did consent to this visit and is aware of possible charges through their insurance for this visit.  The other persons participating in this telemedicine service were none.    Patient ID: Jacqueline Holmes, female    DOB: 1971/05/06, 48 y.o.   MRN: [id number]  HPI Chief Complaint  Patient presents with  . UTI    possible UTI- cloudy urine for a couple weeks, pain with urination and low back pain started over the weekend    Complains of a 2 week history of cloudy urine and developed dysuria 2-3 days ago. No abdominal pain, back pain, N/V/D.   Drinking plenty of water.   Denies fever, chills.  Denies history of recurrent UTI.    Review of Systems Pertinent positives and negatives in the history of present illness.     Objective:   Physical Exam Temp (!) 97.1 F (36.2 C) (Oral)   Wt 148 lb (67.1 kg)   BMI 28.43 kg/m   Alert and oriented and in no acute distress.  Unable to perform a physical exam due to this being a virtual visit.      Assessment & Plan:  Dysuria - Plan: nitrofurantoin, macrocrystal-monohydrate, (MACROBID) 100 MG capsule  Discussed limitations of a virtual visit. No red flag symptoms. A urine specimen was not obtained. Will treat empirically for UTI.  Macrobid prescribed.  Encouraged her to increase water intake. Advised that if she is noticing any worsening symptoms such as fever, abdominal or back pain that she should call or if she is not back to baseline after completing the antibiotic. Follow-up as needed  Time spent on call was 14 minutes and in review of previous records 3 minutes total.  This virtual service is not related to other E/M service within previous 7 days.

## 2019-04-28 ENCOUNTER — Ambulatory Visit (INDEPENDENT_AMBULATORY_CARE_PROVIDER_SITE_OTHER): Payer: BC Managed Care – PPO | Admitting: *Deleted

## 2019-04-28 DIAGNOSIS — J309 Allergic rhinitis, unspecified: Secondary | ICD-10-CM

## 2019-05-01 ENCOUNTER — Other Ambulatory Visit: Payer: Self-pay | Admitting: Allergy and Immunology

## 2019-05-01 DIAGNOSIS — J45901 Unspecified asthma with (acute) exacerbation: Secondary | ICD-10-CM

## 2019-05-19 ENCOUNTER — Ambulatory Visit (INDEPENDENT_AMBULATORY_CARE_PROVIDER_SITE_OTHER): Payer: BC Managed Care – PPO | Admitting: *Deleted

## 2019-05-19 DIAGNOSIS — J309 Allergic rhinitis, unspecified: Secondary | ICD-10-CM

## 2019-05-30 ENCOUNTER — Ambulatory Visit (INDEPENDENT_AMBULATORY_CARE_PROVIDER_SITE_OTHER): Payer: BC Managed Care – PPO | Admitting: *Deleted

## 2019-05-30 DIAGNOSIS — J309 Allergic rhinitis, unspecified: Secondary | ICD-10-CM

## 2019-06-05 ENCOUNTER — Other Ambulatory Visit: Payer: Self-pay | Admitting: *Deleted

## 2019-06-05 DIAGNOSIS — J3089 Other allergic rhinitis: Secondary | ICD-10-CM

## 2019-06-05 DIAGNOSIS — H1013 Acute atopic conjunctivitis, bilateral: Secondary | ICD-10-CM

## 2019-06-05 MED ORDER — LEVOCETIRIZINE DIHYDROCHLORIDE 5 MG PO TABS: ORAL_TABLET | ORAL | 0 refills | Status: DC

## 2019-06-09 ENCOUNTER — Ambulatory Visit (INDEPENDENT_AMBULATORY_CARE_PROVIDER_SITE_OTHER): Payer: BC Managed Care – PPO | Admitting: *Deleted

## 2019-06-09 DIAGNOSIS — J309 Allergic rhinitis, unspecified: Secondary | ICD-10-CM

## 2019-06-23 ENCOUNTER — Ambulatory Visit (INDEPENDENT_AMBULATORY_CARE_PROVIDER_SITE_OTHER): Payer: BC Managed Care – PPO | Admitting: *Deleted

## 2019-06-23 DIAGNOSIS — J309 Allergic rhinitis, unspecified: Secondary | ICD-10-CM

## 2019-06-30 ENCOUNTER — Ambulatory Visit (INDEPENDENT_AMBULATORY_CARE_PROVIDER_SITE_OTHER): Payer: BC Managed Care – PPO

## 2019-06-30 DIAGNOSIS — J309 Allergic rhinitis, unspecified: Secondary | ICD-10-CM | POA: Diagnosis not present

## 2019-07-03 ENCOUNTER — Other Ambulatory Visit: Payer: Self-pay

## 2019-07-03 ENCOUNTER — Encounter: Payer: Self-pay | Admitting: Family Medicine

## 2019-07-03 ENCOUNTER — Ambulatory Visit: Payer: BC Managed Care – PPO | Admitting: Family Medicine

## 2019-07-03 VITALS — BP 110/68 | HR 90 | Temp 98.4°F | Ht 61.0 in | Wt 156.4 lb

## 2019-07-03 DIAGNOSIS — D229 Melanocytic nevi, unspecified: Secondary | ICD-10-CM

## 2019-07-03 DIAGNOSIS — Z7189 Other specified counseling: Secondary | ICD-10-CM

## 2019-07-03 DIAGNOSIS — Z111 Encounter for screening for respiratory tuberculosis: Secondary | ICD-10-CM | POA: Diagnosis not present

## 2019-07-03 DIAGNOSIS — Z0000 Encounter for general adult medical examination without abnormal findings: Secondary | ICD-10-CM

## 2019-07-03 DIAGNOSIS — Z7185 Encounter for immunization safety counseling: Secondary | ICD-10-CM

## 2019-07-03 NOTE — Progress Notes (Signed)
Subjective:    Patient ID: Jacqueline Holmes, female    DOB: 1971-09-12, 48 y.o.   MRN: [id number]  HPI Chief Complaint  Patient presents with  . Annual Exam    needs tb test    She is here for a complete physical exam. She normally sees Dr. Tomi Bamberger.  Last CPE: July 2019  Other providers: OB/GYN- Sharen Counter  Allergist- Dr. Harlow Mares or Dr. Leilani Able Dr. Tye Savoy at [facility] for thyroid issues and hormonal issues.    Her only new concern today includes an abnormal appearing mole on her right shoulder. She thinks it seems to be changing in size, getting larger. Has seen a dermatologist in the past but not for this issue.   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   Social history: Lives with her husband, works as Pharmacist, hospital  Denies smoking, drinking alcohol, drug use  Diet: fairly healthy per patient  Excerise: occasional walks in the park. Nothing regular   Immunizations: UTD.   Health maintenance:  Mammogram: 08/2018 Colonoscopy: never. Prefers to wait until next July.  Last Gynecological Exam: 08/2018 Last Menstrual cycle: years ago  Last Dental Exam: January 2020 Last Eye Exam: July 2020  Wears seatbelt always, uses sunscreen, smoke detectors in home and functioning, does not text while driving and feels safe in home environment.   Reviewed allergies, medications, past medical, surgical, family, and social history.     Review of Systems Review of Systems Constitutional: -fever, -chills, -sweats, -unexpected weight change,-fatigue ENT: -runny nose, -ear pain, -sore throat Cardiology:  -chest pain, -palpitations, -edema Respiratory: -cough, -shortness of breath, -wheezing Gastroenterology: -abdominal pain, -nausea, -vomiting, -diarrhea, -constipation  Hematology: -bleeding or bruising problems Musculoskeletal: -arthralgias, -myalgias, -joint swelling, -back pain Ophthalmology: -vision changes Urology: -dysuria, -difficulty urinating,  -hematuria, -urinary frequency, -urgency Neurology: -headache, -weakness, -tingling, -numbness       Objective:   Physical Exam BP 110/68   Pulse 90   Temp 98.4 F (36.9 C)   Ht 5\' 1"  (1.549 m)   Wt 156 lb 6.4 oz (70.9 kg)   SpO2 96%   BMI 29.55 kg/m   General Appearance:    Alert, cooperative, no distress, appears stated age  Head:    Normocephalic, without obvious abnormality, atraumatic  Eyes:    PERRL, conjunctiva/corneas clear, EOM's intact, fundi    benign  Ears:    Normal TM's and external ear canals  Nose:   Mask in place   Throat:   Mask in place   Neck:   Supple, no lymphadenopathy;  thyroid:  no   enlargement/tenderness/nodules; no carotid   bruit or JVD  Back:    Spine nontender, no curvature, ROM normal, no CVA     tenderness  Lungs:     Clear to auscultation bilaterally without wheezes, rales or     ronchi; respirations unlabored  Chest Wall:    No tenderness or deformity   Heart:    Regular rate and rhythm, S1 and S2 normal, no murmur, rub   or gallop  Breast Exam:    OB/GYN  Abdomen:     Soft, non-tender, nondistended, normoactive bowel sounds,    no masses, no hepatosplenomegaly  Genitalia:    OB/GYN     Extremities:   No clubbing, cyanosis or edema  Pulses:   2+ and symmetric all extremities  Skin:   Skin color, texture, turgor normal, no rashes. Irregular shaped and elongated nevus on the top of  her right shoulder, abnormal hyperpigmentation within the nevus  Lymph nodes:   Cervical, supraclavicular, and axillary nodes normal  Neurologic:   CNII-XII intact, normal strength, sensation and gait; reflexes 2+ and symmetric throughout          Psych:   Normal mood, affect, hygiene and grooming.        Assessment & Plan:  Routine general medical examination at a health care facility - Plan: CBC with Differential/Platelet, Comprehensive metabolic panel, she appears to be doing well overall. Sees multiple specialists for chronic health conditions. Counseling  on healthy diet and exercise to improve health.   Screening for tuberculosis - Plan: QuantiFERON-TB Gold Plus, follow up pending result   Immunization counseling - Plan: up to date  Atypical nevus - Plan: encouraged her to follow up with her dermatologist.    Will check labs however she declines lipids and thyroid function.

## 2019-07-03 NOTE — Patient Instructions (Signed)
It was a pleasure seeing you today.   Call and schedule with your dermatologist as discussed.    Preventive Care 18-49 Years Old, Female Preventive care refers to visits with your health care provider and lifestyle choices that can promote health and wellness. This includes:  A yearly physical exam. This may also be called an annual well check.  Regular dental visits and eye exams.  Immunizations.  Screening for certain conditions.  Healthy lifestyle choices, such as eating a healthy diet, getting regular exercise, not using drugs or products that contain nicotine and tobacco, and limiting alcohol use. What can I expect for my preventive care visit? Physical exam Your health care provider will check your:  Height and weight. This may be used to calculate body mass index (BMI), which tells if you are at a healthy weight.  Heart rate and blood pressure.  Skin for abnormal spots. Counseling Your health care provider may ask you questions about your:  Alcohol, tobacco, and drug use.  Emotional well-being.  Home and relationship well-being.  Sexual activity.  Eating habits.  Work and work Statistician.  Method of birth control.  Menstrual cycle.  Pregnancy history. What immunizations do I need?  Influenza (flu) vaccine  This is recommended every year. Tetanus, diphtheria, and pertussis (Tdap) vaccine  You may need a Td booster every 10 years. Varicella (chickenpox) vaccine  You may need this if you have not been vaccinated. Zoster (shingles) vaccine  You may need this after age 11. Measles, mumps, and rubella (MMR) vaccine  You may need at least one dose of MMR if you were born in 1957 or later. You may also need a second dose. Pneumococcal conjugate (PCV13) vaccine  You may need this if you have certain conditions and were not previously vaccinated. Pneumococcal polysaccharide (PPSV23) vaccine  You may need one or two doses if you smoke cigarettes or if  you have certain conditions. Meningococcal conjugate (MenACWY) vaccine  You may need this if you have certain conditions. Hepatitis A vaccine  You may need this if you have certain conditions or if you travel or work in places where you may be exposed to hepatitis A. Hepatitis B vaccine  You may need this if you have certain conditions or if you travel or work in places where you may be exposed to hepatitis B. Haemophilus influenzae type b (Hib) vaccine  You may need this if you have certain conditions. Human papillomavirus (HPV) vaccine  If recommended by your health care provider, you may need three doses over 6 months. You may receive vaccines as individual doses or as more than one vaccine together in one shot (combination vaccines). Talk with your health care provider about the risks and benefits of combination vaccines. What tests do I need? Blood tests  Lipid and cholesterol levels. These may be checked every 5 years, or more frequently if you are over 65 years old.  Hepatitis C test.  Hepatitis B test. Screening  Lung cancer screening. You may have this screening every year starting at age 69 if you have a 30-pack-year history of smoking and currently smoke or have quit within the past 15 years.  Colorectal cancer screening. All adults should have this screening starting at age 57 and continuing until age 69. Your health care provider may recommend screening at age 74 if you are at increased risk. You will have tests every 1-10 years, depending on your results and the type of screening test.  Diabetes screening. This is  done by checking your blood sugar (glucose) after you have not eaten for a while (fasting). You may have this done every 1-3 years.  Mammogram. This may be done every 1-2 years. Talk with your health care provider about when you should start having regular mammograms. This may depend on whether you have a family history of breast cancer.  BRCA-related cancer  screening. This may be done if you have a family history of breast, ovarian, tubal, or peritoneal cancers.  Pelvic exam and Pap test. This may be done every 3 years starting at age 66. Starting at age 34, this may be done every 5 years if you have a Pap test in combination with an HPV test. Other tests  Sexually transmitted disease (STD) testing.  Bone density scan. This is done to screen for osteoporosis. You may have this scan if you are at high risk for osteoporosis. Follow these instructions at home: Eating and drinking  Eat a diet that includes fresh fruits and vegetables, whole grains, lean protein, and low-fat dairy.  Take vitamin and mineral supplements as recommended by your health care provider.  Do not drink alcohol if: ? Your health care provider tells you not to drink. ? You are pregnant, may be pregnant, or are planning to become pregnant.  If you drink alcohol: ? Limit how much you have to 0-1 drink a day. ? Be aware of how much alcohol is in your drink. In the [place] one drink equals one 12 oz bottle of beer (355 mL), one 5 oz glass of wine (148 mL), or one 1 oz glass of hard liquor (44 mL). Lifestyle  Take daily care of your teeth and gums.  Stay active. Exercise for at least 30 minutes on 5 or more days each week.  Do not use any products that contain nicotine or tobacco, such as cigarettes, e-cigarettes, and chewing tobacco. If you need help quitting, ask your health care provider.  If you are sexually active, practice safe sex. Use a condom or other form of birth control (contraception) in order to prevent pregnancy and STIs (sexually transmitted infections).  If told by your health care provider, take low-dose aspirin daily starting at age 25. What's next?  Visit your health care provider once a year for a well check visit.  Ask your health care provider how often you should have your eyes and teeth checked.  Stay up to date on all vaccines. This  information is not intended to replace advice given to you by your health care provider. Make sure you discuss any questions you have with your health care provider. Document Released: 12/13/2015 Document Revised: 07/28/2018 Document Reviewed: 07/28/2018 Elsevier Patient Education  2020 [employer].

## 2019-07-04 ENCOUNTER — Encounter: Payer: Self-pay | Admitting: Podiatry

## 2019-07-04 ENCOUNTER — Ambulatory Visit: Payer: BC Managed Care – PPO | Admitting: Podiatry

## 2019-07-04 DIAGNOSIS — L600 Ingrowing nail: Secondary | ICD-10-CM | POA: Diagnosis not present

## 2019-07-04 DIAGNOSIS — M79674 Pain in right toe(s): Secondary | ICD-10-CM | POA: Diagnosis not present

## 2019-07-04 DIAGNOSIS — M79675 Pain in left toe(s): Secondary | ICD-10-CM | POA: Diagnosis not present

## 2019-07-04 MED ORDER — CEPHALEXIN 500 MG PO CAPS: 500.0000 mg | ORAL_CAPSULE | Freq: Three times a day (TID) | ORAL | 0 refills | Status: DC

## 2019-07-04 MED ORDER — TRAMADOL HCL 50 MG PO TABS: 50.0000 mg | ORAL_TABLET | Freq: Three times a day (TID) | ORAL | 0 refills | Status: AC | PRN

## 2019-07-04 NOTE — Patient Instructions (Signed)

## 2019-07-05 LAB — CBC WITH DIFFERENTIAL/PLATELET
Basophils Absolute: 0 10*3/uL (ref 0.0–0.2)
Basos: 1 %
EOS (ABSOLUTE): 0.1 10*3/uL (ref 0.0–0.4)
Eos: 2 %
Hematocrit: 43.7 % (ref 34.0–46.6)
Hemoglobin: 14.7 g/dL (ref 11.1–15.9)
Immature Grans (Abs): 0 10*3/uL (ref 0.0–0.1)
Immature Granulocytes: 0 %
Lymphocytes Absolute: 2.4 10*3/uL (ref 0.7–3.1)
Lymphs: 32 %
MCH: 28.7 pg (ref 26.6–33.0)
MCHC: 33.6 g/dL (ref 31.5–35.7)
MCV: 85 fL (ref 79–97)
Monocytes Absolute: 0.5 10*3/uL (ref 0.1–0.9)
Monocytes: 7 %
Neutrophils Absolute: 4.5 10*3/uL (ref 1.4–7.0)
Neutrophils: 58 %
Platelets: 336 10*3/uL (ref 150–450)
RBC: 5.12 x10E6/uL (ref 3.77–5.28)
RDW: 12.4 % (ref 11.7–15.4)
WBC: 7.5 10*3/uL (ref 3.4–10.8)

## 2019-07-05 LAB — COMPREHENSIVE METABOLIC PANEL
ALT: 24 IU/L (ref 0–32)
AST: 17 IU/L (ref 0–40)
Albumin/Globulin Ratio: 1.6 (ref 1.2–2.2)
Albumin: 4.3 g/dL (ref 3.8–4.8)
Alkaline Phosphatase: 82 IU/L (ref 39–117)
BUN/Creatinine Ratio: 27 — ABNORMAL HIGH (ref 9–23)
BUN: 20 mg/dL (ref 6–24)
Bilirubin Total: 0.3 mg/dL (ref 0.0–1.2)
CO2: 23 mmol/L (ref 20–29)
Calcium: 9.2 mg/dL (ref 8.7–10.2)
Chloride: 103 mmol/L (ref 96–106)
Creatinine, Ser: 0.74 mg/dL (ref 0.57–1.00)
GFR calc Af Amer: 110 mL/min/{1.73_m2} (ref >59)
GFR calc non Af Amer: 95 mL/min/{1.73_m2} (ref >59)
Globulin, Total: 2.7 g/dL (ref 1.5–4.5)
Glucose: 129 mg/dL — ABNORMAL HIGH (ref 65–99)
Potassium: 4.7 mmol/L (ref 3.5–5.2)
Sodium: 143 mmol/L (ref 134–144)
Total Protein: 7 g/dL (ref 6.0–8.5)

## 2019-07-05 LAB — QUANTIFERON-TB GOLD PLUS
QuantiFERON Mitogen Value: >10 IU/mL
QuantiFERON Nil Value: 0.01 IU/mL
QuantiFERON TB1 Ag Value: 0.01 IU/mL
QuantiFERON TB2 Ag Value: 0.01 IU/mL
QuantiFERON-TB Gold Plus: NEGATIVE

## 2019-07-05 NOTE — Progress Notes (Signed)
Subjective: 48 year old female presents the office today for concerns of chronic ingrown toenails to both of her big toenails both corners.  She states that they are painful with pressure in shoes.  Denies any drainage from the area.  She does try pedicures. No other concerns today. Denies any systemic complaints such as fevers, chills, nausea, vomiting. No acute changes since last appointment, and no other complaints at this time.   Objective: AAO x3, NAD DP/PT pulses palpable bilaterally, CRT less than 3 seconds Chronic ingrown toenails both medial lateral aspects of bilateral hallux toenails with tenderness to palpation.  Localized edema and faint erythema there is no drainage of pus or ascending cellulitis.  The erythema is coming more from inflammation as opposed to infection.  No open lesions or pre-ulcerative lesions.  No pain with calf compression, swelling, warmth, erythema  Assessment: Chronic long toenails bilateral hallux  Plan: -All treatment options discussed with the patient including all alternatives, risks, complications.  -At this time, the patient is requesting partial nail removal with chemical matricectomy to the symptomatic portion of the nail. Risks and complications were discussed with the patient for which they understand and written consent was obtained. Under sterile conditions a total of 3 mL of a mixture of 2% lidocaine plain and 0.5% Marcaine plain was infiltrated in a hallux block fashion. Once anesthetized, the skin was prepped in sterile fashion. A tourniquet was then applied. Next the medial and lateral aspect of hallux nail border was then sharply excised making sure to remove the entire offending nail border. Once the nails were ensured to be removed area was debrided and the underlying skin was intact. There is no purulence identified in the procedure. Next phenol was then applied under standard conditions and copiously irrigated. Silvadene was applied. A dry  sterile dressing was applied. After application of the dressing the tourniquet was removed and there is found to be an immediate capillary refill time to the digit. The patient tolerated the procedure well any complications. Post procedure instructions were discussed the patient for which he verbally understood. Follow-up in one week for nail check or sooner if any problems are to arise. Discussed signs/symptoms of infection and directed to call the office immediately should any occur or go directly to the emergency room. In the meantime, encouraged to call the office with any questions, concerns, changes symptoms. -Keflex -Tramadol prn -Patient encouraged to call the office with any questions, concerns, change in symptoms.   Return in about 1 week (around 07/11/2019).  Trula Slade DPM

## 2019-07-05 NOTE — Progress Notes (Signed)
Done

## 2019-07-07 ENCOUNTER — Ambulatory Visit (INDEPENDENT_AMBULATORY_CARE_PROVIDER_SITE_OTHER): Payer: BC Managed Care – PPO | Admitting: *Deleted

## 2019-07-07 DIAGNOSIS — J309 Allergic rhinitis, unspecified: Secondary | ICD-10-CM

## 2019-07-11 ENCOUNTER — Ambulatory Visit: Payer: BC Managed Care – PPO

## 2019-07-11 ENCOUNTER — Ambulatory Visit (INDEPENDENT_AMBULATORY_CARE_PROVIDER_SITE_OTHER): Payer: BC Managed Care – PPO | Admitting: Orthotics

## 2019-07-11 ENCOUNTER — Other Ambulatory Visit: Payer: Self-pay

## 2019-07-11 DIAGNOSIS — M722 Plantar fascial fibromatosis: Secondary | ICD-10-CM

## 2019-07-11 DIAGNOSIS — M79674 Pain in right toe(s): Secondary | ICD-10-CM

## 2019-07-11 DIAGNOSIS — L600 Ingrowing nail: Secondary | ICD-10-CM

## 2019-07-11 DIAGNOSIS — M79675 Pain in left toe(s): Secondary | ICD-10-CM

## 2019-07-11 NOTE — Patient Instructions (Signed)

## 2019-07-20 ENCOUNTER — Ambulatory Visit (INDEPENDENT_AMBULATORY_CARE_PROVIDER_SITE_OTHER): Payer: BC Managed Care – PPO | Admitting: *Deleted

## 2019-07-20 DIAGNOSIS — J309 Allergic rhinitis, unspecified: Secondary | ICD-10-CM | POA: Diagnosis not present

## 2019-07-27 NOTE — Progress Notes (Signed)
Patient wants new f/o as old ones are worn.Same as before.

## 2019-08-01 HISTORY — PX: OTHER SURGICAL HISTORY: SHX169

## 2019-08-02 ENCOUNTER — Ambulatory Visit (INDEPENDENT_AMBULATORY_CARE_PROVIDER_SITE_OTHER): Payer: Self-pay | Admitting: *Deleted

## 2019-08-02 DIAGNOSIS — J309 Allergic rhinitis, unspecified: Secondary | ICD-10-CM

## 2019-08-04 NOTE — Progress Notes (Signed)
Patient is here today for follow-up appointment, recent procedure performed on 07/04/2019, removal of ingrown toenails.  She states that she is to have any problems at this time, she continues to soak, and she is not having any pain.  No redness, erythema, swelling, no drainage, no other signs and symptoms of infection.  Areas are healing well and scabbed over at this time.  Discussed signs and symptoms of infection, verbal and written instructions were given to the patient.  She is to follow-up as needed with any acute symptom changes.

## 2019-08-15 DIAGNOSIS — J301 Allergic rhinitis due to pollen: Secondary | ICD-10-CM

## 2019-08-15 NOTE — Progress Notes (Signed)
VIALS EXP 08-14-20

## 2019-08-23 ENCOUNTER — Ambulatory Visit (INDEPENDENT_AMBULATORY_CARE_PROVIDER_SITE_OTHER): Payer: Self-pay | Admitting: *Deleted

## 2019-08-23 DIAGNOSIS — J309 Allergic rhinitis, unspecified: Secondary | ICD-10-CM

## 2019-08-28 ENCOUNTER — Other Ambulatory Visit: Payer: Self-pay | Admitting: Allergy and Immunology

## 2019-08-28 DIAGNOSIS — H1013 Acute atopic conjunctivitis, bilateral: Secondary | ICD-10-CM

## 2019-08-28 DIAGNOSIS — J3089 Other allergic rhinitis: Secondary | ICD-10-CM

## 2019-09-04 ENCOUNTER — Encounter: Payer: Self-pay | Admitting: Obstetrics and Gynecology

## 2019-09-06 ENCOUNTER — Encounter: Payer: Self-pay | Admitting: Obstetrics and Gynecology

## 2019-09-06 ENCOUNTER — Other Ambulatory Visit: Payer: Self-pay

## 2019-09-06 ENCOUNTER — Ambulatory Visit (INDEPENDENT_AMBULATORY_CARE_PROVIDER_SITE_OTHER): Payer: 59 | Admitting: Obstetrics and Gynecology

## 2019-09-06 VITALS — BP 138/74 | HR 100 | Temp 98.0°F | Resp 20 | Ht 61.0 in | Wt 161.4 lb

## 2019-09-06 DIAGNOSIS — N393 Stress incontinence (female) (male): Secondary | ICD-10-CM | POA: Diagnosis not present

## 2019-09-06 DIAGNOSIS — N812 Incomplete uterovaginal prolapse: Secondary | ICD-10-CM | POA: Diagnosis not present

## 2019-09-06 DIAGNOSIS — Z01419 Encounter for gynecological examination (general) (routine) without abnormal findings: Secondary | ICD-10-CM | POA: Diagnosis not present

## 2019-09-06 NOTE — Progress Notes (Signed)
48 y.o. G17P0 Married Caucasian female here for annual exam.    Pleased with breast reduction surgery.  May do an additional surgery to even out the breast sizes.   On HRT through [facility].  Taking oral estradiol and Prometrium tablet and testosterone cream.  No vaginal bleeding.   Still with stress incontinence.   Report she does have some shortness of breath with activity.   Working for [employer].   PCP: [name], MD    Patient's last menstrual period was 11/30/2016 (approximate).           Sexually active: Yes.    The current method of family planning is post menopausal status.    Exercising: No.  The patient does not participate in regular exercise at present. Smoker:  no  Health Maintenance: Pap: 09-02-18 Neg:Neg HR HPV, 05/2017 normal per patient History of abnormal Pap:  Yes, about 15 or more years ago patient thinks she had a biopsy.  MMG: 09-02-18 Neg/density B/BiRads1.  Just done at [facility] this week.  Colonoscopy:  n/a BMD:   n/a  Result  n/a TDaP:  04/2018 Gardasil:   n/a [id number] Hep C:never Screening Labs:   PCP.    reports that she has never smoked. She has never used smokeless tobacco. She reports current alcohol use. She reports that she does not use drugs.  Past Medical History:  Diagnosis Date  . Angio-edema   . Asthma   . Cough   . Diabetes mellitus arising in pregnancy    now pre-diabetes  . Family history of breast cancer    MGM  . Fatigue   . Generalized headaches    migraines on occasion.  Berenice Primas disease   . Hyperlipidemia   . Hypothyroidism    s/p thyroidectomy for Grave's disease (Dr. Tye Savoy at [facility])  . IBS (irritable bowel syndrome)   . Incontinence   . Migraine    without aura  . Night sweats   . Recurrent upper respiratory infection (URI)   . Sinus problem    runny nose  . SOB (shortness of breath)   . Urticaria     Past Surgical History:  Procedure Laterality Date  . ABDOMINOPLASTY  12/2015    Dr. Towanda Malkin  . BREAST REDUCTION SURGERY  11/03/2018  . BREAST SURGERY  10/2018   breast reduction  . CESAREAN SECTION    . DILATATION & CURETTAGE/HYSTEROSCOPY WITH TRUECLEAR N/A 01/16/2014   Procedure: DILATATION & CURETTAGE/HYSTEROSCOPY WITH TRUCLEAR;  Surgeon: Marylynn Pearson, MD;  Location: [facility];  Service: Gynecology;  Laterality: N/A;  . LASIK    . prk laser eye surgery Left 08/2019  . TOTAL THYROIDECTOMY  07/30/11   Dr. Harlow Asa (Grave's disease)  . VULVA Milagros Loll BIOPSY N/A 01/16/2014   Procedure: VULVAR BIOPSY;  Surgeon: Marylynn Pearson, MD;  Location: [facility];  Service: Gynecology;  Laterality: N/A;    Current Outpatient Medications  Medication Sig Dispense Refill  . acetaminophen (TYLENOL) 500 MG tablet Take 1,000 mg by mouth every 6 (six) hours as needed for headache. Reported on 06/17/2016    . ASHWAGANDHA PO Take 300 mg by mouth 2 (two) times daily.    Jolyne Loa Grape-Goldenseal (BERBERINE COMPLEX PO) Take by mouth 2 (two) times daily.    . budesonide-formoterol (SYMBICORT) 160-4.5 MCG/ACT inhaler Two puffs with spacer device twice a day during asthma flares.  Rinse, gargle and spit after use. 1 Inhaler 5  . cholecalciferol (VITAMIN D) 1000 UNITS tablet Take  1,000 Units by mouth daily.     Marland Kitchen estradiol (ESTRACE) 1 MG tablet Take 1 tablet by mouth daily.    Marland Kitchen Fexofenadine HCl (ALLEGRA PO) Take by mouth.    . fluorometholone (FML) 0.1 % ophthalmic suspension Place 1 drop into the left eye 4 (four) times daily.    Marland Kitchen guaiFENesin (MUCINEX) 600 MG 12 hr tablet Take 600 mg by mouth 2 (two) times daily.    . hydrOXYzine (ATARAX/VISTARIL) 25 MG tablet     . ibuprofen (ADVIL,MOTRIN) 200 MG tablet Take 600-800 mg by mouth every 6 (six) hours as needed.    Marland Kitchen ipratropium (ATROVENT) 0.03 % nasal spray 2 sprays per nostril every 8 hours if needed 30 mL 5  . levocetirizine (XYZAL) 5 MG tablet TAKE 1 TABLET ONCE DAILY IN THE EVENING. 90 tablet 0  . metFORMIN (GLUCOPHAGE) 1000 MG tablet  Take 1,000 mg by mouth daily.    . NON FORMULARY 306 mg daily. Estrogen Control    . Pregnenolone POWD 200 mg by Does not apply route daily.    Marland Kitchen PROAIR HFA 108 (90 Base) MCG/ACT inhaler INHALE 2 PUFFS EVERY 4 HOURS AS NEEDED 8.5 g 0  . progesterone (PROMETRIUM) 200 MG capsule     . QVAR REDIHALER 80 MCG/ACT inhaler INHALE 2 PUFFS TWICE DAILY 10.6 g 5  . Spacer/Aero-Holding Chambers [facility] DIAMOND) DEVI Use as directed with inhaler. Dx:  J45.40 Asthma 1 Device 2  . thyroid (NP THYROID) 90 MG tablet Take 1 tablet by mouth daily.     No current facility-administered medications for this visit.     Family History  Problem Relation Age of Onset  . Cancer Father        prostate  . Allergic rhinitis Mother   . Asthma Mother   . Alpha-1 antitrypsin deficiency Mother   . COPD Mother   . Cancer Maternal Grandmother        pancreatic  . Diabetes Maternal Grandmother   . Heart disease Maternal Grandfather        CABG in 60's  . Diabetes Maternal Grandfather   . Diabetes Paternal Grandmother   . Diabetes Paternal Grandfather   . Heart disease Paternal Grandfather   . Angioedema Neg Hx   . Eczema Neg Hx     Review of Systems  All other systems reviewed and are negative.   Exam:   BP 138/74   Pulse 100   Temp 98 F (36.7 C) (Temporal)   Resp 20   Ht 5\' 1"  (1.549 m)   Wt 161 lb 6.4 oz (73.2 kg)   LMP 11/30/2016 (Approximate)   BMI 30.50 kg/m     General appearance: alert, cooperative and appears stated age Head: normocephalic, without obvious abnormality, atraumatic Neck: no adenopathy, supple, symmetrical, trachea midline and thyroid normal to inspection and palpation Lungs: clear to auscultation bilaterally Breasts: normal appearance, no masses or tenderness, No nipple retraction or dimpling, No nipple discharge or bleeding, No axillary adenopathy Heart: regular rate and rhythm.  Systolic murmur at left sternal border. Abdomen: soft, non-tender; no masses, no  organomegaly Extremities: extremities normal, atraumatic, no cyanosis or edema Skin: skin color, texture, turgor normal. No rashes or lesions Lymph nodes: cervical, supraclavicular, and axillary nodes normal. Neurologic: grossly normal  Pelvic: External genitalia:  no lesions              No abnormal inguinal nodes palpated.              Urethra:  normal appearing urethra with no masses, tenderness or lesions              Bartholins and Skenes: normal                 Vagina: normal appearing vagina with normal color and discharge, no lesions.  Second degree cystocele, first degree uterine prolapse, first degree rectocele.               Cervix: no lesions              Pap taken: No. Bimanual Exam:  Uterus:  normal size, contour, position, consistency, mobility, non-tender              Adnexa: no mass, fullness, tenderness              Rectal exam: Yes.  .  Confirms.              Anus:  normal sphincter tone, no lesions  Chaperone was present for exam.  Assessment:   Well woman visit with normal exam. Menopausal female. On HRT.  This includes testosterone pellets. Status post breast reduction.  Migraines without aura. Status post thyroidectomy.  Graves.  Prediabetic. Incomplete uterovaginal prolapse.  Mild GSI. Cardiac murmur.   Plan: Mammogram screening discussed. Self breast awareness reviewed. Pap and HR HPV as above. Guidelines for Calcium, Vitamin D, regular exercise program including cardiovascular and weight bearing exercise. We discussed prolapse and incontinence and reviewed choices of observation, pessary, PT, or surgical care.  ACOG HO on prolapse and incontinence in general and surgical treatment for these conditions.  Flu vaccine recommended.  She will do with her PCP.  She will see her PCP regarding her cardiac murmur.  I discussed proper diagnosis of the murmur through echocardiogram. Follow up annually and prn.  After visit summary provided.

## 2019-09-06 NOTE — Patient Instructions (Addendum)
Jacqueline Holmes,   Please contact your primary care doctor to have further evaluation of your heart murmur!  Jacqueline Half, MD  XERCISE AND DIET:  We recommended that you start or continue a regular exercise program for good health. Regular exercise means any activity that makes your heart beat faster and makes you sweat.  We recommend exercising at least 30 minutes per day at least 3 days a week, preferably 4 or 5.  We also recommend a diet low in fat and sugar.  Inactivity, poor dietary choices and obesity can cause diabetes, heart attack, stroke, and kidney damage, among others.    ALCOHOL AND SMOKING:  Women should limit their alcohol intake to no more than 7 drinks/beers/glasses of wine (combined, not each!) per week. Moderation of alcohol intake to this level decreases your risk of breast cancer and liver damage. And of course, no recreational drugs are part of a healthy lifestyle.  And absolutely no smoking or even second hand smoke. Most people know smoking can cause heart and lung diseases, but did you know it also contributes to weakening of your bones? Aging of your skin?  Yellowing of your teeth and nails?  CALCIUM AND VITAMIN D:  Adequate intake of calcium and Vitamin D are recommended.  The recommendations for exact amounts of these supplements seem to change often, but generally speaking 600 mg of calcium (either carbonate or citrate) and 800 units of Vitamin D per day seems prudent. Certain women may benefit from higher intake of Vitamin D.  If you are among these women, your doctor will have told you during your visit.    PAP SMEARS:  Pap smears, to check for cervical cancer or precancers,  have traditionally been done yearly, although recent scientific advances have shown that most women can have pap smears less often.  However, every woman still should have a physical exam from her gynecologist every year. It will include a breast check, inspection of the vulva and vagina to check for abnormal  growths or skin changes, a visual exam of the cervix, and then an exam to evaluate the size and shape of the uterus and ovaries.  And after 49 years of age, a rectal exam is indicated to check for rectal cancers. We will also provide age appropriate advice regarding health maintenance, like when you should have certain vaccines, screening for sexually transmitted diseases, bone density testing, colonoscopy, mammograms, etc.   MAMMOGRAMS:  All women over 6 years old should have a yearly mammogram. Many facilities now offer a "3D" mammogram, which may cost around $50 extra out of pocket. If possible,  we recommend you accept the option to have the 3D mammogram performed.  It both reduces the number of women who will be called back for extra views which then turn out to be normal, and it is better than the routine mammogram at detecting truly abnormal areas.    COLONOSCOPY:  Colonoscopy to screen for colon cancer is recommended for all women at age 42.  We know, you hate the idea of the prep.  We agree, BUT, having colon cancer and not knowing it is worse!!  Colon cancer so often starts as a polyp that can be seen and removed at colonscopy, which can quite literally save your life!  And if your first colonoscopy is normal and you have no family history of colon cancer, most women don't have to have it again for 10 years.  Once every ten years, you can do something that  may end up saving your life, right?  We will be happy to help you get it scheduled when you are ready.  Be sure to check your insurance coverage so you understand how much it will cost.  It may be covered as a preventative service at no cost, but you should check your particular policy.

## 2019-09-08 ENCOUNTER — Ambulatory Visit (INDEPENDENT_AMBULATORY_CARE_PROVIDER_SITE_OTHER): Payer: 59 | Admitting: *Deleted

## 2019-09-08 DIAGNOSIS — J309 Allergic rhinitis, unspecified: Secondary | ICD-10-CM | POA: Diagnosis not present

## 2019-09-13 NOTE — Progress Notes (Signed)
Chief Complaint  Patient presents with  . Acute Visit    heart murmur noticed at GYN office last week    Patient presents for evaluation of heart murmur noted by Dr. Quincy Simmonds at her recent [facility]. She had reported some shortness of breath with exertion--unchanged, per pt, related to her asthma.  She reports she gets some shortness of breath only with very rigorous exercise (running up and down the stairs, sometimes needs inhaler) unchanged. She denies any chest pressure, palpitations, dizziness, syncope.  Last CBC normal Thyroid monitored elsewhere--last was 6 months ago, due to get done this week. Feels normal, denies thyroid symptoms.  On metformin for prediabetes Dr. Tye Savoy  Patient is Durene Cal about flu, pneumonia and shingles vaccines.  Has a friend who got shingles, and was interested.  Immunization History  Administered Date(s) Administered  . Influenza Inj Mdck Quad Pf 10/29/2017  . Influenza,inj,Quad PF,6+ Mos 08/30/2018  . Influenza-Unspecified 09/13/2016  . Pneumococcal Polysaccharide-23 05/16/2018  . Tdap 05/16/2018   PMH, PSH, SH reviewed  Outpatient Encounter Medications as of 09/14/2019  Medication Sig Note  . ASHWAGANDHA PO Take 300 mg by mouth 2 (two) times daily.   Jolyne Loa Grape-Goldenseal (BERBERINE COMPLEX PO) Take by mouth 2 (two) times daily.   . cholecalciferol (VITAMIN D) 1000 UNITS tablet Take 1,000 Units by mouth daily.    Marland Kitchen estradiol (ESTRACE) 1 MG tablet Take 1 tablet by mouth daily.   Marland Kitchen Fexofenadine HCl (ALLEGRA PO) Take by mouth.   . levocetirizine (XYZAL) 5 MG tablet TAKE 1 TABLET ONCE DAILY IN THE EVENING.   . metFORMIN (GLUCOPHAGE) 1000 MG tablet Take 1,000 mg by mouth daily. 09/14/2019: Pt resports it is the extended release form 500mg , takes 2 once daily  . NON FORMULARY 306 mg daily. Estrogen Control   . Pregnenolone POWD 200 mg by Does not apply route daily.   . progesterone (PROMETRIUM) 200 MG capsule    . QVAR REDIHALER 80 MCG/ACT  inhaler INHALE 2 PUFFS TWICE DAILY   . thyroid (NP THYROID) 90 MG tablet Take 1 tablet by mouth daily.   Marland Kitchen acetaminophen (TYLENOL) 500 MG tablet Take 1,000 mg by mouth every 6 (six) hours as needed for headache. Reported on 06/17/2016   . budesonide-formoterol (SYMBICORT) 160-4.5 MCG/ACT inhaler Two puffs with spacer device twice a day during asthma flares.  Rinse, gargle and spit after use. (Patient not taking: Reported on 09/14/2019) 05/16/2018: Uses prn with flares  . fluorometholone (FML) 0.1 % ophthalmic suspension Place 1 drop into the left eye 4 (four) times daily.   Marland Kitchen guaiFENesin (MUCINEX) 600 MG 12 hr tablet Take 600 mg by mouth 2 (two) times daily.   . hydrOXYzine (ATARAX/VISTARIL) 25 MG tablet    . ibuprofen (ADVIL,MOTRIN) 200 MG tablet Take 600-800 mg by mouth every 6 (six) hours as needed. 07/03/2019: prn  . ipratropium (ATROVENT) 0.03 % nasal spray 2 sprays per nostril every 8 hours if needed (Patient not taking: Reported on 09/14/2019)   . PROAIR HFA 108 (90 Base) MCG/ACT inhaler INHALE 2 PUFFS EVERY 4 HOURS AS NEEDED (Patient not taking: Reported on 09/14/2019)   . Spacer/Aero-Holding Chambers [facility] DIAMOND) DEVI Use as directed with inhaler. Dx:  J45.40 Asthma (Patient not taking: Reported on 09/14/2019)    No facility-administered encounter medications on file as of 09/14/2019.    No Known Allergies  ROS:  See HPI. No headaches, dizziness, chest pain.  Asthma and allergies are stable, controlled.  No palpitations, URI symptoms, fever, chills.  PHYSICAL EXAM:  BP 130/84   Pulse 60   Temp 98.2 F (36.8 C)   Ht 5' 2.5" (1.588 m)   Wt 160 lb 12.8 oz (72.9 kg)   BMI 28.94 kg/m   Wt Readings from Last 3 Encounters:  09/14/19 160 lb 12.8 oz (72.9 kg)  09/06/19 161 lb 6.4 oz (73.2 kg)  07/03/19 156 lb 6.4 oz (70.9 kg)   166# in 04/2018  Well-appearing, pleasant female, in good spirits, in no distress HEENT: conjunctiva and sclera are clear, EOMI. Wearing mask Neck:  no lymphadenopathy, thyromegaly or carotid bruit Heart: regular rate and rhythm. Barely audible SEM at RUSB only. Lungs: clear bilaterally, no wheezing Neuro: alert and oriented, normal gait Psych: normal mood, affect, hygiene and grooming   ASSESSMENT/PLAN:  Heart murmur - minimal, reassured. Ddx reviewed.  to contact us if any symptoms develop; will get echo if changes/louder or any sx  Need for influenza vaccination - Plan: Flu Vaccine QUAD 6+ mos PF IM (Fluarix Quad PF)  Counseled re: other vaccines--shingrix age 48.  Had pneumovax last year, only needs q5-10 years.

## 2019-09-14 ENCOUNTER — Ambulatory Visit (INDEPENDENT_AMBULATORY_CARE_PROVIDER_SITE_OTHER): Payer: 59 | Admitting: Family Medicine

## 2019-09-14 ENCOUNTER — Other Ambulatory Visit: Payer: Self-pay

## 2019-09-14 ENCOUNTER — Encounter: Payer: Self-pay | Admitting: Family Medicine

## 2019-09-14 VITALS — BP 130/84 | HR 60 | Temp 98.2°F | Ht 62.5 in | Wt 160.8 lb

## 2019-09-14 DIAGNOSIS — Z23 Encounter for immunization: Secondary | ICD-10-CM | POA: Diagnosis not present

## 2019-09-14 DIAGNOSIS — R011 Cardiac murmur, unspecified: Secondary | ICD-10-CM

## 2019-09-14 NOTE — Patient Instructions (Signed)
Shingles shots are recommended at age 49. Pneumonia shots are only needed every 5-10 years--you had one in 04/2018.  I could BARELY hear any murmur today--I do not suspect any significant pathology will be found at the valve (maybe just slight stiffening).  I will be sure to listen carefully at the future, and if getting louder we can send you for the echocardiogram (ultrasound of the heart). If you develop any chest pain, pressure, palpitations, shortness of breath (different than your asthma), swelling in your feet or any other concerns, please be sure to bring them to our attention!

## 2019-09-15 ENCOUNTER — Encounter: Payer: Self-pay | Admitting: Obstetrics and Gynecology

## 2019-09-21 ENCOUNTER — Ambulatory Visit (INDEPENDENT_AMBULATORY_CARE_PROVIDER_SITE_OTHER): Payer: 59 | Admitting: *Deleted

## 2019-09-21 DIAGNOSIS — J309 Allergic rhinitis, unspecified: Secondary | ICD-10-CM | POA: Diagnosis not present

## 2019-09-28 ENCOUNTER — Ambulatory Visit (INDEPENDENT_AMBULATORY_CARE_PROVIDER_SITE_OTHER): Payer: 59

## 2019-09-28 ENCOUNTER — Telehealth: Payer: Self-pay | Admitting: Allergy and Immunology

## 2019-09-28 DIAGNOSIS — J309 Allergic rhinitis, unspecified: Secondary | ICD-10-CM

## 2019-09-28 NOTE — Telephone Encounter (Signed)
Pt called and needs a rx sent to her or to the online pharmacy needs the Qvar .[phone].

## 2019-09-28 NOTE — Telephone Encounter (Signed)
Thank you :)

## 2019-09-28 NOTE — Telephone Encounter (Signed)
Advise patient she need an OV and set up appt to see anne on 10/06/2019 to get her medication for Qvar refilled and sent to [email or web address] because it's a cheaper plan for her.

## 2019-10-05 NOTE — Progress Notes (Signed)
[place]  [address] Dept: [phone]  FOLLOW UP NOTE  Patient ID: Jacqueline Holmes, female    DOB: 1971-10-08  Age: 48 y.o. MRN: [id number] Date of Office Visit: 10/06/2019  Assessment  Chief Complaint: Allergic Rhinitis   HPI Jacqueline Holmes is a 48 year old female who presents to the clinic for a follow up appointment. She was last seen in this clinic on 05/24/2019 by Dr. Verlin Fester for evaluation of asthma, allergic rhinitis, and allergic conjunctivitis. At today's visit, she reports her asthma had been well controlled until about 1 month ago when she ran out of Qvar and was out of this medication for 2 weeks. She reports that she has been using Qvar for the last 2 weeks however, she reports slight chest tightness and shortness of breath with exercise. She is using Qvar 80 HandiHaler 2 puffs twice a day and albuterol about 1 time a week with mild relief of symptoms. She has not used Symbicort for >6 months. Allergic rhinitis is reported as not well controlled with nasal congestion and thick post nasal drainage. She continues Dymista as needed, however, she reports this medication works for a few weeks and then causes nasal congestion and irritation. She continues Xyzal once a day and is not currently using nasal saline rinses. Allergic conjunctivitis is reported as well controlled with Pazeo eye drops as needed.  She denies heartburn or reflux at this time.  Her current medications are listed in the chart.   Drug Allergies:  No Known Allergies  Physical Exam: BP 118/78   Pulse (!) 104   Temp 98.4 F (36.9 C) (Temporal)   Resp 16   Ht 5' 1.5" (1.562 m)   Wt 162 lb 3.2 oz (73.6 kg)   SpO2 97%   BMI 30.15 kg/m    Physical Exam Vitals signs reviewed.  Constitutional:      Appearance: Normal appearance.  HENT:     Head: Normocephalic and atraumatic.     Right Ear: Tympanic membrane normal.     Left Ear: Tympanic membrane normal.     Nose:     Comments: Bilateral  nares slightly erythematous with clear nasal drainage noted.  Pharynx slightly erythematous with no exudate noted.  Ears normal.  Eyes normal. Eyes:     Conjunctiva/sclera: Conjunctivae normal.  Neck:     Musculoskeletal: Normal range of motion and neck supple.  Cardiovascular:     Rate and Rhythm: Normal rate and regular rhythm.     Heart sounds: Normal heart sounds. No murmur.  Pulmonary:     Effort: Pulmonary effort is normal.     Breath sounds: Normal breath sounds.     Comments: Bilateral expiratory wheeze noted which cleared post bronchodilator therapy. Musculoskeletal: Normal range of motion.  Skin:    General: Skin is warm and dry.  Neurological:     Mental Status: She is alert and oriented to person, place, and time.  Psychiatric:        Mood and Affect: Mood normal.        Behavior: Behavior normal.        Thought Content: Thought content normal.        Judgment: Judgment normal.     Diagnostics: FVC 2.31, FEV1 2.02.  Predicted FVC 3.29, predicted FEV1 2.62.  Spirometry indicates mild restriction.  Postbronchodilator spirometry FVC 2.53, FEV1 2.04.  Postbronchodilator spirometry indicates mild restriction with no significant bronchodilator response. This is consistent with previous spirometry readings.  Assessment and  Plan: 1. Moderate persistent asthma with acute exacerbation   2. Allergic rhinitis   3. Allergic conjunctivitis of both eyes     Meds ordered this encounter  Medications  . budesonide-formoterol (SYMBICORT) 160-4.5 MCG/ACT inhaler    Sig: Two puffs with spacer device twice a day during asthma flares.  Rinse, gargle and spit after use.    Dispense:  1 Inhaler    Refill:  5    Please hold. Patient will call when needed    Patient Instructions  Asthma Begin prednisone 10 mg tablets. Take 2 tablets once a day for 4 days, then take 1 tablet on the 5th day, then stop For now and for asthma flares, begin Symbicort 160-2 puffs twice a day with a spacer  for 2 weeks or until back to breathing baseline. Stop Qvar while using Symbicort 160 Continue albuterol 2 puffs every 4 hours with a spacer as needed for cough or wheeze  Allergic rhinitis Continue Xyzal 5 mg once a day as needed for a runny nose Stop Dymista  Begin Nasacort 1-2 sprays in each nostril once a day for a stuffy nose Continue azelastine nasal spray 2 sprays in each nostril once a day as needed for a runny nose For thick post nasal drainage, begin Mucinex [phone] mg twice a dayConsider saline nasal rinses as needed for nasal symptoms. Use this before any medicated nasal sprays for best result  Allergic conjunctivitis Continue Pazeo eye drops one drop in each eye once a day as needed for red, itchy eyes  Call the clinic if this treatment plan is not working well for you  Followv up in 2 months or sooner if needed.   Return in about 2 months (around 12/06/2019), or if symptoms worsen or fail to improve.    Thank you for the opportunity to care for this patient.  Please do not hesitate to contact me with questions.  Gareth Morgan, FNP [facility] [place] [facility]

## 2019-10-05 NOTE — Patient Instructions (Addendum)
Asthma Begin prednisone 10 mg tablets. Take 2 tablets once a day for 4 days, then take 1 tablet on the 5th day, then stop For now and for asthma flares, begin Symbicort 160-2 puffs twice a day with a spacer for 2 weeks or until back to breathing baseline. Stop Qvar while using Symbicort 160 Continue albuterol 2 puffs every 4 hours with a spacer as needed for cough or wheeze  Allergic rhinitis Continue Xyzal 5 mg once a day as needed for a runny nose Stop Dymista  Begin Nasacort 1-2 sprays in each nostril once a day for a stuffy nose Continue azelastine nasal spray 2 sprays in each nostril once a day as needed for a runny nose For thick post nasal drainage, begin Mucinex [phone] mg twice a dayConsider saline nasal rinses as needed for nasal symptoms. Use this before any medicated nasal sprays for best result  Allergic conjunctivitis Continue Pazeo eye drops one drop in each eye once a day as needed for red, itchy eyes  Call the clinic if this treatment plan is not working well for you  Followv up in 2 months or sooner if needed.

## 2019-10-06 ENCOUNTER — Ambulatory Visit: Payer: Self-pay | Admitting: *Deleted

## 2019-10-06 ENCOUNTER — Ambulatory Visit (INDEPENDENT_AMBULATORY_CARE_PROVIDER_SITE_OTHER): Payer: 59 | Admitting: Family Medicine

## 2019-10-06 ENCOUNTER — Encounter: Payer: Self-pay | Admitting: Family Medicine

## 2019-10-06 ENCOUNTER — Other Ambulatory Visit: Payer: Self-pay

## 2019-10-06 VITALS — BP 118/78 | HR 104 | Temp 98.4°F | Resp 16 | Ht 61.5 in | Wt 162.2 lb

## 2019-10-06 DIAGNOSIS — H1013 Acute atopic conjunctivitis, bilateral: Secondary | ICD-10-CM

## 2019-10-06 DIAGNOSIS — J4541 Moderate persistent asthma with (acute) exacerbation: Secondary | ICD-10-CM | POA: Diagnosis not present

## 2019-10-06 DIAGNOSIS — J3089 Other allergic rhinitis: Secondary | ICD-10-CM | POA: Diagnosis not present

## 2019-10-06 DIAGNOSIS — J309 Allergic rhinitis, unspecified: Secondary | ICD-10-CM

## 2019-10-06 MED ORDER — BUDESONIDE-FORMOTEROL FUMARATE 160-4.5 MCG/ACT IN AERO: INHALATION_SPRAY | RESPIRATORY_TRACT | 5 refills | Status: DC

## 2019-10-20 ENCOUNTER — Ambulatory Visit (INDEPENDENT_AMBULATORY_CARE_PROVIDER_SITE_OTHER): Payer: 59

## 2019-10-20 DIAGNOSIS — J309 Allergic rhinitis, unspecified: Secondary | ICD-10-CM | POA: Diagnosis not present

## 2019-10-24 ENCOUNTER — Ambulatory Visit (INDEPENDENT_AMBULATORY_CARE_PROVIDER_SITE_OTHER): Payer: 59

## 2019-10-24 DIAGNOSIS — J309 Allergic rhinitis, unspecified: Secondary | ICD-10-CM

## 2019-11-09 ENCOUNTER — Ambulatory Visit (INDEPENDENT_AMBULATORY_CARE_PROVIDER_SITE_OTHER): Payer: 59

## 2019-11-09 DIAGNOSIS — J309 Allergic rhinitis, unspecified: Secondary | ICD-10-CM | POA: Diagnosis not present

## 2019-11-17 ENCOUNTER — Ambulatory Visit (INDEPENDENT_AMBULATORY_CARE_PROVIDER_SITE_OTHER): Payer: 59 | Admitting: *Deleted

## 2019-11-17 DIAGNOSIS — J309 Allergic rhinitis, unspecified: Secondary | ICD-10-CM

## 2019-11-21 ENCOUNTER — Ambulatory Visit (INDEPENDENT_AMBULATORY_CARE_PROVIDER_SITE_OTHER): Payer: 59

## 2019-11-21 DIAGNOSIS — J309 Allergic rhinitis, unspecified: Secondary | ICD-10-CM

## 2019-11-28 ENCOUNTER — Ambulatory Visit: Payer: 59 | Attending: Internal Medicine

## 2019-11-28 DIAGNOSIS — Z20822 Contact with and (suspected) exposure to covid-19: Secondary | ICD-10-CM

## 2019-11-29 LAB — NOVEL CORONAVIRUS, NAA: SARS-CoV-2, NAA: NOT DETECTED

## 2019-12-01 DIAGNOSIS — M722 Plantar fascial fibromatosis: Secondary | ICD-10-CM

## 2019-12-01 HISTORY — DX: Plantar fascial fibromatosis: M72.2

## 2019-12-08 ENCOUNTER — Ambulatory Visit: Payer: Self-pay

## 2019-12-08 ENCOUNTER — Telehealth: Payer: Self-pay | Admitting: *Deleted

## 2019-12-08 ENCOUNTER — Other Ambulatory Visit: Payer: Self-pay

## 2019-12-08 ENCOUNTER — Ambulatory Visit: Payer: BC Managed Care – PPO | Admitting: Family Medicine

## 2019-12-08 ENCOUNTER — Encounter: Payer: Self-pay | Admitting: Family Medicine

## 2019-12-08 VITALS — BP 136/82 | HR 88 | Temp 97.3°F | Resp 18

## 2019-12-08 DIAGNOSIS — J4541 Moderate persistent asthma with (acute) exacerbation: Secondary | ICD-10-CM | POA: Diagnosis not present

## 2019-12-08 DIAGNOSIS — J3089 Other allergic rhinitis: Secondary | ICD-10-CM

## 2019-12-08 DIAGNOSIS — H1013 Acute atopic conjunctivitis, bilateral: Secondary | ICD-10-CM

## 2019-12-08 DIAGNOSIS — J309 Allergic rhinitis, unspecified: Secondary | ICD-10-CM

## 2019-12-08 MED ORDER — BUDESONIDE-FORMOTEROL FUMARATE 160-4.5 MCG/ACT IN AERO: INHALATION_SPRAY | RESPIRATORY_TRACT | 5 refills | Status: DC

## 2019-12-08 NOTE — Telephone Encounter (Signed)
Received a fax from [employer] stating that a PA was not needed for Symbicort.

## 2019-12-08 NOTE — Progress Notes (Signed)
0  [address] [place] [address] Dept: [phone]  FOLLOW UP NOTE  Patient ID: Jacqueline Holmes, female    DOB: 1970/12/23  Age: 49 y.o. MRN: [id number] Date of Office Visit: 12/08/2019  Assessment  Chief Complaint: Follow-up and Shortness of Breath  HPI Jacqueline Holmes is a 49 year old female who presents to the clinic for a follow up visit. She was last seen in this clinic on 10/06/2019 for evaluation of asthma, allergic rhinitis on allergen immunotherapy, and allergic conjunctivitis. At today's visit, she reports shortness of breath and chest tightness beginning on December 27th. At that time, she began using Symbicort with moderate relief of symptoms for 1 week when she ran out of Symbicort. She usually uses Qvar 80-2 puffs with a spacer and Symbicort for flare. She is currently using albuterol occasionally. Allergic rhinitis is reported as moderately controlled with nasal congestion and post nasal drainage for which she is using Allegra in the evening and Xyzal in the morning. She is not able to use steroid nasal sprays, antihistamine nasal sprays or saline rinses as she reports these make her nostrils swell. Allergic conjunctivitis is reported as well controlled with no medical intervention. Her current medications are listed in the chart.   Drug Allergies:  No Known Allergies  Physical Exam: BP 136/82 (BP Location: Right Arm, Patient Position: Sitting, Cuff Size: Normal)   Pulse 88   Temp (!) 97.3 F (36.3 C) (Temporal)   Resp 18   SpO2 98%    Physical Exam Vitals reviewed.  Constitutional:      Appearance: Normal appearance. She is well-developed.  HENT:     Head: Normocephalic and atraumatic.     Right Ear: Tympanic membrane normal.     Left Ear: Tympanic membrane normal.     Nose:     Comments: Bilateral nares slightly erythematous with clear thick nasal drainage noted. Pharynx normal. Ears normal. Eyes normal.    Mouth/Throat:     Pharynx: Oropharynx is clear.   Eyes:     Conjunctiva/sclera: Conjunctivae normal.  Cardiovascular:     Rate and Rhythm: Normal rate and regular rhythm.     Heart sounds: Normal heart sounds. No murmur.  Pulmonary:     Effort: Pulmonary effort is normal.     Breath sounds: Normal breath sounds.     Comments: Lungs clear to ausculation Musculoskeletal:        General: Normal range of motion.     Cervical back: Normal range of motion and neck supple.  Skin:    General: Skin is warm and dry.  Neurological:     Mental Status: She is alert and oriented to person, place, and time.  Psychiatric:        Mood and Affect: Mood normal.        Behavior: Behavior normal.        Thought Content: Thought content normal.        Judgment: Judgment normal.     Diagnostics: FVC 2.47, FEV1 1.81. Predicted FVC 3.29, predicted FEV1 2.62. Spirometry indicates mild restriction. This is consistent with previous spirometry readings.   Assessment and Plan: 1. Moderate persistent asthma with acute exacerbation   2. Allergic conjunctivitis of both eyes   3. Allergic rhinitis     Meds ordered this encounter  Medications  . budesonide-formoterol (SYMBICORT) 160-4.5 MCG/ACT inhaler    Sig: Two puffs with spacer device twice a day during asthma flares.  Rinse, gargle and spit after use.  Dispense:  1 Inhaler    Refill:  5    Patient Instructions  Asthma Prednisone 10 mg. Take 2 tablets twice a day for 3 days, then take 2 tablets once for 1 day, take 1 tablet on the 5th day, then stop For now and for asthma flares, begin Symbicort 160-2 puffs twice a day with a spacer for 2 weeks or until back to breathing baseline. Stop Qvar while using Symbicort 160  When cough and wheeze free stop Symbicort and begin Qvar 80-2 puffs twice a day with a spacer to prevent cough or wheeze Continue albuterol 2 puffs every 4 hours with a spacer as needed for cough or wheeze  Allergic rhinitis Continue Xyzal 5 mg once a day as needed for a runny nose  Stop Dymista  Begin Nasacort 1-2 sprays in each nostril once a day for a stuffy nose Continue azelastine nasal spray 2 sprays in each nostril once a day as needed for a runny nose For thick post nasal drainage, begin Mucinex [phone] mg twice a dayConsider saline nasal rinses as needed for nasal symptoms. Use this before any medicated nasal sprays for best result  Allergic conjunctivitis Continue Pazeo eye drops one drop in each eye once a day as needed for red, itchy eyes  Call the clinic if this treatment plan is not working well for you  Follow up in 2 months or sooner if needed.   Return in about 2 months (around 02/05/2020), or if symptoms worsen or fail to improve.    Thank you for the opportunity to care for this patient.  Please do not hesitate to contact me with questions.  Gareth Morgan, FNP [facility]

## 2019-12-08 NOTE — Telephone Encounter (Signed)
PA has been submitted for Symbicort 160 through CoverMyMeds and is currently pending approval or denial.

## 2019-12-08 NOTE — Patient Instructions (Addendum)
Asthma Prednisone 10 mg. Take 2 tablets twice a day for 3 days, then take 2 tablets once for 1 day, take 1 tablet on the 5th day, then stop For now and for asthma flares, begin Symbicort 160-2 puffs twice a day with a spacer for 2 weeks or until back to breathing baseline. Stop Qvar while using Symbicort 160  When cough and wheeze free stop Symbicort and begin Qvar 80-2 puffs twice a day with a spacer to prevent cough or wheeze Continue albuterol 2 puffs every 4 hours with a spacer as needed for cough or wheeze  Allergic rhinitis Continue Xyzal 5 mg once a day as needed for a runny nose Stop Dymista  Begin Nasacort 1-2 sprays in each nostril once a day for a stuffy nose Continue azelastine nasal spray 2 sprays in each nostril once a day as needed for a runny nose For thick post nasal drainage, begin Mucinex [phone] mg twice a dayConsider saline nasal rinses as needed for nasal symptoms. Use this before any medicated nasal sprays for best result  Allergic conjunctivitis Continue Pazeo eye drops one drop in each eye once a day as needed for red, itchy eyes  Call the clinic if this treatment plan is not working well for you  Follow up in 2 months or sooner if needed.

## 2019-12-19 ENCOUNTER — Other Ambulatory Visit: Payer: Self-pay | Admitting: Allergy and Immunology

## 2019-12-19 DIAGNOSIS — J3089 Other allergic rhinitis: Secondary | ICD-10-CM

## 2019-12-19 DIAGNOSIS — H1013 Acute atopic conjunctivitis, bilateral: Secondary | ICD-10-CM

## 2019-12-22 ENCOUNTER — Ambulatory Visit (INDEPENDENT_AMBULATORY_CARE_PROVIDER_SITE_OTHER): Payer: BC Managed Care – PPO | Admitting: *Deleted

## 2019-12-22 DIAGNOSIS — J309 Allergic rhinitis, unspecified: Secondary | ICD-10-CM

## 2019-12-25 NOTE — Progress Notes (Signed)
VIALS EXP 12-24-20 

## 2019-12-26 DIAGNOSIS — J301 Allergic rhinitis due to pollen: Secondary | ICD-10-CM

## 2020-01-12 ENCOUNTER — Ambulatory Visit (INDEPENDENT_AMBULATORY_CARE_PROVIDER_SITE_OTHER): Payer: BC Managed Care – PPO | Admitting: *Deleted

## 2020-01-12 DIAGNOSIS — J309 Allergic rhinitis, unspecified: Secondary | ICD-10-CM | POA: Diagnosis not present

## 2020-01-20 LAB — RESULTS CONSOLE HPV: CHL HPV: NEGATIVE

## 2020-01-22 ENCOUNTER — Telehealth: Payer: Self-pay | Admitting: Obstetrics and Gynecology

## 2020-01-22 ENCOUNTER — Ambulatory Visit: Payer: BC Managed Care – PPO | Admitting: Obstetrics and Gynecology

## 2020-01-22 ENCOUNTER — Encounter: Payer: Self-pay | Admitting: Obstetrics and Gynecology

## 2020-01-22 ENCOUNTER — Other Ambulatory Visit (HOSPITAL_COMMUNITY)
Admission: RE | Admit: 2020-01-22 | Discharge: 2020-01-22 | Disposition: A | Discharge status: Home / Self Care | Payer: BC Managed Care – PPO | Source: Ambulatory Visit | Attending: Obstetrics and Gynecology | Admitting: Obstetrics and Gynecology

## 2020-01-22 ENCOUNTER — Other Ambulatory Visit: Payer: Self-pay

## 2020-01-22 VITALS — BP 152/70 | HR 80 | Temp 97.2°F | Resp 14 | Ht 61.5 in | Wt 165.4 lb

## 2020-01-22 DIAGNOSIS — Z124 Encounter for screening for malignant neoplasm of cervix: Secondary | ICD-10-CM

## 2020-01-22 DIAGNOSIS — N950 Postmenopausal bleeding: Secondary | ICD-10-CM | POA: Diagnosis not present

## 2020-01-22 LAB — HM PAP SMEAR: HM Pap smear: NEGATIVE

## 2020-01-22 NOTE — Progress Notes (Signed)
GYNECOLOGY  VISIT   HPI: 49 y.o.   Married  Caucasian  female   G2P0 with No LMP recorded. Patient is premenopausal.   here for   Post coital bleeding.  She noticed some dry blood when she urinated and then persisted throughout the day.  This followed intercourse.  No pain or discomfort or dryness symptoms.  This has not occurred before.   She is on HRT and she missed a few days of the estrogen early last week.  She also missed some doses of testosterone cream all last week.  No missed dosages of progesterone, which she takes at hs.   This is the first time she has had bleeding in menopause.   She has had some slight cramping in her LLQ, yesterday and today.   No change in partner.   GYNECOLOGIC HISTORY: No LMP recorded. Patient is premenopausal. Contraception:  Post menopausal  Menopausal hormone therapy:  Estradiol/progesterone Last mammogram:  09-04-2019 [facility] 3 probably benign follow up 6 months  Last pap smear:   09-12-18 negative, HR HPV negative         OB History    Gravida  2   Para      Term      Preterm      AB      Living  1     SAB      TAB      Ectopic      Multiple      Live Births                 Patient Active Problem List   Diagnosis Date Noted  . Allergic conjunctivitis of both eyes 10/06/2019  . Elevated ALT measurement 01/28/2018  . Moderate persistent asthma with acute exacerbation 01/18/2018  . Allergic conjunctivitis 01/18/2018  . Elevated blood-pressure reading without diagnosis of hypertension 01/18/2018  . Non-seasonal allergic rhinitis due to pollen 05/03/2017  . Mild persistent asthma, uncomplicated [id number]  . Thyroid nodule, uninodular 09/28/2011  . Unspecified hypothyroidism 09/10/2011  . Allergic rhinitis 09/10/2011  . Asthma 04/25/2008  . Contact dermatitis and other eczema due to plants (except food) 08/04/2007    Past Medical History:  Diagnosis Date  . Angio-edema   . Asthma   . Cough   . Diabetes  mellitus arising in pregnancy    now pre-diabetes  . Family history of breast cancer    MGM  . Fatigue   . Generalized headaches    migraines on occasion.  Berenice Primas disease   . Hyperlipidemia   . Hypothyroidism    s/p thyroidectomy for Grave's disease (Dr. Tye Savoy at [facility])  . IBS (irritable bowel syndrome)   . Incontinence   . Migraine    without aura  . Night sweats   . Recurrent upper respiratory infection (URI)   . Sinus problem    runny nose  . SOB (shortness of breath)   . Urticaria     Past Surgical History:  Procedure Laterality Date  . ABDOMINOPLASTY  12/2015   Dr. Towanda Malkin  . BREAST REDUCTION SURGERY  11/03/2018  . BREAST SURGERY  10/2018   breast reduction  . CESAREAN SECTION    . DILATATION & CURETTAGE/HYSTEROSCOPY WITH TRUECLEAR N/A 01/16/2014   Procedure: DILATATION & CURETTAGE/HYSTEROSCOPY WITH TRUCLEAR;  Surgeon: Marylynn Pearson, MD;  Location: [facility];  Service: Gynecology;  Laterality: N/A;  . LASIK    . prk laser eye surgery Left 08/2019  . TOTAL THYROIDECTOMY  07/30/11   Dr. Harlow Asa (Grave's disease)  . VULVA Milagros Loll BIOPSY N/A 01/16/2014   Procedure: VULVAR BIOPSY;  Surgeon: Marylynn Pearson, MD;  Location: [facility];  Service: Gynecology;  Laterality: N/A;    Current Outpatient Medications  Medication Sig Dispense Refill  . acetaminophen (TYLENOL) 500 MG tablet Take 1,000 mg by mouth every 6 (six) hours as needed for headache. Reported on 06/17/2016    . ASHWAGANDHA PO Take 300 mg by mouth 2 (two) times daily.    Jolyne Loa Grape-Goldenseal (BERBERINE COMPLEX PO) Take by mouth 2 (two) times daily.    . budesonide-formoterol (SYMBICORT) 160-4.5 MCG/ACT inhaler Two puffs with spacer device twice a day during asthma flares.  Rinse, gargle and spit after use. 1 Inhaler 5  . cholecalciferol (VITAMIN D) 1000 UNITS tablet Take 1,000 Units by mouth daily.     Marland Kitchen estradiol (ESTRACE) 1 MG tablet Take 1 tablet by mouth daily.    Marland Kitchen Fexofenadine  HCl (ALLEGRA PO) Take by mouth.    . fluorometholone (FML) 0.1 % ophthalmic suspension Place 1 drop into the left eye 4 (four) times daily.    Marland Kitchen guaiFENesin (MUCINEX) 600 MG 12 hr tablet Take 600 mg by mouth 2 (two) times daily.    . hydrOXYzine (ATARAX/VISTARIL) 25 MG tablet     . ibuprofen (ADVIL,MOTRIN) 200 MG tablet Take 600-800 mg by mouth every 6 (six) hours as needed.    Marland Kitchen ipratropium (ATROVENT) 0.03 % nasal spray 2 sprays per nostril every 8 hours if needed 30 mL 5  . levocetirizine (XYZAL) 5 MG tablet TAKE 1 TABLET ONCE DAILY IN THE EVENING. 90 tablet 1  . levothyroxine (SYNTHROID) 75 MCG tablet Take 75 mcg by mouth daily.    . metFORMIN (GLUCOPHAGE) 1000 MG tablet Take 1,000 mg by mouth daily.    . NON FORMULARY 306 mg daily. Estrogen Control    . Pregnenolone POWD 200 mg by Does not apply route daily.    Marland Kitchen PROAIR HFA 108 (90 Base) MCG/ACT inhaler INHALE 2 PUFFS EVERY 4 HOURS AS NEEDED 8.5 g 0  . progesterone (PROMETRIUM) 200 MG capsule     . QVAR REDIHALER 80 MCG/ACT inhaler INHALE 2 PUFFS TWICE DAILY 10.6 g 5  . Spacer/Aero-Holding Chambers [facility] DIAMOND) DEVI Use as directed with inhaler. Dx:  J45.40 Asthma 1 Device 2  . thyroid (NP THYROID) 90 MG tablet Take 1 tablet by mouth daily.     No current facility-administered medications for this visit.     ALLERGIES: Patient has no known allergies.  Family History  Problem Relation Age of Onset  . Cancer Father        prostate  . Allergic rhinitis Mother   . Asthma Mother   . Alpha-1 antitrypsin deficiency Mother   . COPD Mother   . Cancer Maternal Grandmother        pancreatic  . Diabetes Maternal Grandmother   . Heart disease Maternal Grandfather        CABG in 60's  . Diabetes Maternal Grandfather   . Diabetes Paternal Grandmother   . Diabetes Paternal Grandfather   . Heart disease Paternal Grandfather   . Angioedema Neg Hx   . Eczema Neg Hx     Social History   Socioeconomic History  . Marital status:  Married    Spouse name: Not on file  . Number of children: 1  . Years of education: Not on file  . Highest education level: Not on file  Occupational History  .  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 Smoking status: Never Smoker  . Smokeless tobacco: Never Used  Substance and Sexual Activity  . Alcohol use: Yes    Comment: once a month  . Drug use: No  . Sexual activity: Yes    Partners: Male    Birth control/protection: None    Comment: postmenopausal (LMP3/2018)  Other Topics Concern  . Not on file  Social History Narrative   Divorced and re-married.  Lives with her husband.  Son is grown and lives in own apartment   Social Determinants of Health   Financial Resource Strain:   . Difficulty of Paying Living Expenses: Not on file  Food Insecurity:   . Worried About Charity fundraiser in the Last Year: Not on file  . Ran Out of Food in the Last Year: Not on file  Transportation Needs:   . Lack of Transportation (Medical): Not on file  . Lack of Transportation (Non-Medical): Not on file  Physical Activity:   . Days of Exercise per Week: Not on file  . Minutes of Exercise per Session: Not on file  Stress:   . Feeling of Stress : Not on file  Social Connections:   . Frequency of Communication with Friends and Family: Not on file  . Frequency of Social Gatherings with Friends and Family: Not on file  . Attends Religious Services: Not on file  . Active Member of Clubs or Organizations: Not on file  . Attends Archivist Meetings: Not on file  . Marital Status: Not on file  Intimate Partner Violence:   . Fear of Current or Ex-Partner: Not on file  . Emotionally Abused: Not on file  . Physically Abused: Not on file  . Sexually Abused: Not on file    Review of Systems  Constitutional: Negative.   HENT: Negative.   Eyes: Negative.   Respiratory: Negative.   Cardiovascular:  Negative.   Gastrointestinal: Negative.   Endocrine: Negative.   Genitourinary: Positive for vaginal bleeding.       Cramping  Musculoskeletal: Negative.   Skin: Negative.   Allergic/Immunologic: Negative.   Neurological: Negative.   Hematological: Negative.   Psychiatric/Behavioral: Negative.     PHYSICAL EXAMINATION:    BP (!) 152/70 (BP Location: Right Arm, Patient Position: Sitting, Cuff Size: Normal)   Pulse 80   Temp (!) 97.2 F (36.2 C) (Skin)   Resp 14   Ht 5' 1.5" (1.562 m)   Wt 165 lb 6.4 oz (75 kg)   BMI 30.75 kg/m     General appearance: alert, cooperative and appears stated age   Pelvic: External genitalia:  no lesions              Urethra:  normal appearing urethra with no masses, tenderness or lesions              Bartholins and Skenes: normal                 Vagina: normal appearing vagina with normal color and discharge, no lesions              Cervix: no lesions.  Small amount of bloody mucous from the os.                 Bimanual Exam:  Uterus:  normal size, contour, position, consistency, mobility, non-tender  Adnexa: no mass, fullness, tenderness              Chaperone was present for exam - Karmen Bongo, RN.  ASSESSMENT  Postmenopausal bleeding.  HRT patient.  Missed doses.  LLQ pain.   PLAN  We discussed etiologies for postmenopausal bleeding.  Skipped HR doses, atrophy, infection, polyps, fibroids, ovarian cysts, premalignancy and malignanct.  Cervical cancer screening.  Pap and HR HPV.  Return for pelvic US and possible endometrial biopsy.  Procedures explained.  Questions invited and answered.   An After Visit Summary was printed and given to the patient.  __20____ minutes face to face time of which over 50% was spent in counseling.

## 2020-01-22 NOTE — Telephone Encounter (Signed)
Spoke with patient. Patient reports spotting on 2/21, has since resolved, did occur after intercourse. Reports mild " menses like cramps" in left lower abd/pelvis, this has since resolved. Reports vaginal odor. Denies vaginal itching, pain, burning, dryness, fever/chills or urinary symptoms. LMP approximately 3 yrs ago.   Covid 19 prescreen negative, precautions reviewed. OV scheduled for today at 10:30am with Dr. Quincy Simmonds.   Last AEX 09/06/19  Routing to provider for final review. Patient is agreeable to disposition. Will close encounter.

## 2020-01-22 NOTE — Patient Instructions (Signed)
Endometrial Biopsy  Endometrial biopsy is a procedure in which a tissue sample is taken from inside the uterus. The sample is taken from the endometrium, which is the lining of the uterus. The tissue sample is then checked under a microscope to see if the tissue is normal or abnormal. This procedure helps to determine where you are in your menstrual cycle and how hormone levels are affecting the lining of the uterus. This procedure may also be used to evaluate uterine bleeding or to diagnose endometrial cancer, endometrial tuberculosis, polyps, or other inflammatory conditions. Tell a health care provider about:  Any allergies you have.  All medicines you are taking, including vitamins, herbs, eye drops, creams, and over-the-counter medicines.  Any problems you or family members have had with anesthetic medicines.  Any blood disorders you have.  Any surgeries you have had.  Any medical conditions you have.  Whether you are pregnant or may be pregnant. What are the risks? Generally, this is a safe procedure. However, problems may occur, including:  Bleeding.  Pelvic infection.  Puncture of the wall of the uterus with the biopsy device (rare). What happens before the procedure?  Keep a record of your menstrual cycles as told by your health care provider. You may need to schedule your procedure for a specific time in your cycle.  You may want to bring a sanitary pad to wear after the procedure.  Ask your health care provider about: ? Changing or stopping your regular medicines. This is especially important if you are taking diabetes medicines or blood thinners. ? Taking medicines such as aspirin and ibuprofen. These medicines can thin your blood. Do not take these medicines before your procedure if your health care provider instructs you not to.  Plan to have someone take you home from the hospital or clinic. What happens during the procedure?  To lower your risk of  infection: ? Your health care team will wash or sanitize their hands.  You will lie on an exam table with your feet and legs supported as in a pelvic exam.  Your health care provider will insert an instrument (speculum) into your vagina to see your cervix.  Your cervix will be cleansed with an antiseptic solution.  A medicine (local anesthetic) will be used to numb the cervix.  A forceps instrument (tenaculum) will be used to hold your cervix steady for the biopsy.  A thin, rod-like instrument (uterine sound) will be inserted through your cervix to determine the length of your uterus and the location where the biopsy sample will be removed.  A thin, flexible tube (catheter) will be inserted through your cervix and into the uterus. The catheter will be used to collect the biopsy sample from your endometrial tissue.  The catheter and speculum will then be removed, and the tissue sample will be sent to a lab for examination. What happens after the procedure?  You will rest in a recovery area until you are ready to go home.  You may have mild cramping and a small amount of vaginal bleeding. This is normal.  It is up to you to get the results of your procedure. Ask your health care provider, or the department that is doing the procedure, when your results will be ready. Summary  Endometrial biopsy is a procedure in which a tissue sample is taken from the endometrium, which is the lining of the uterus.  This procedure may help to diagnose menstrual cycle problems, abnormal bleeding, or other conditions affecting   the endometrium.  Before the procedure, keep a record of your menstrual cycles as told by your health care provider.  The tissue sample that is removed will be checked under a microscope to see if it is normal or abnormal. This information is not intended to replace advice given to you by your health care provider. Make sure you discuss any questions you have with your health care  provider. Document Revised: 10/29/2017 Document Reviewed: 12/02/2016 Elsevier Patient Education  [place]. Postmenopausal Bleeding  Postmenopausal bleeding is any bleeding that a woman has after she has entered into menopause. Menopause is the end of a woman's fertile years. After menopause, a woman no longer ovulates and does not have menstrual periods. Postmenopausal bleeding may have various causes, including:  Menopausal hormone therapy (MHT).  Endometrial atrophy. After menopause, low estrogen hormone levels cause the membrane that lines the uterus (endometrium) to become thinner. You may have bleeding as the endometrium thins.  Endometrial hyperplasia. This condition is caused by excess estrogen hormones and low levels of progesterone hormones. The excess estrogen causes the endometrium to thicken, which can lead to bleeding. In some cases, this can lead to cancer of the uterus.  Endometrial cancer.  Non-cancerous growths (polyps) on the endometrium, the lining of the uterus, or the cervix.  Uterine fibroids. These are non-cancerous growths in or around the uterus muscle tissue that can cause heavy bleeding. Any type of postmenopausal bleeding, even if it appears to be a typical menstrual period, should be evaluated by your health care provider. Treatment will depend on the cause of the bleeding. Follow these instructions at home:  Pay attention to any changes in your symptoms.  Avoid using tampons and douches as told by your health care provider.  Change your pads regularly.  Get regular pelvic exams and Pap tests.  Take iron supplements as told by your health care provider.  Take over-the-counter and prescription medicines only as told by your health care provider.  Keep all follow-up visits as told by your health care provider. This is important. Contact a health care provider if:  Your bleeding lasts more than 1 week.  You have abdominal pain.  You have  bleeding with or after sexual intercourse.  You have bleeding that happens more often than every 3 weeks. Get help right away if:  You have a fever, chills, headache, dizziness, muscle aches, and bleeding.  You have severe pain with bleeding.  You are passing blood clots.  You have heavy bleeding, need more than 1 pad an hour, and have never experienced this before.  You feel faint. Summary  Postmenopausal bleeding is any bleeding that a woman has after she has entered into menopause.  Postmenopausal bleeding may have various causes. Treatment will depend on the cause of the bleeding.  Any type of postmenopausal bleeding, even if it appears to be a typical menstrual period, should be evaluated by your health care provider.  Be sure to pay attention to any changes in your symptoms and keep all follow-up visits as told by your health care provider. This information is not intended to replace advice given to you by your health care provider. Make sure you discuss any questions you have with your health care provider. Document Revised: 02/23/2018 Document Reviewed: 02/09/2017 Elsevier Patient Education  [place].

## 2020-01-22 NOTE — Telephone Encounter (Signed)
Patient is post menopausal and noticed spotting yesterday.

## 2020-01-23 LAB — CYTOLOGY - PAP
Comment: NEGATIVE
Diagnosis: NEGATIVE
High risk HPV: NEGATIVE

## 2020-01-24 ENCOUNTER — Other Ambulatory Visit: Payer: Self-pay

## 2020-01-25 ENCOUNTER — Other Ambulatory Visit (HOSPITAL_COMMUNITY)
Admission: RE | Admit: 2020-01-25 | Discharge: 2020-01-25 | Disposition: A | Discharge status: Home / Self Care | Payer: BC Managed Care – PPO | Source: Ambulatory Visit | Attending: Obstetrics and Gynecology | Admitting: Obstetrics and Gynecology

## 2020-01-25 ENCOUNTER — Ambulatory Visit (INDEPENDENT_AMBULATORY_CARE_PROVIDER_SITE_OTHER): Payer: BC Managed Care – PPO

## 2020-01-25 ENCOUNTER — Ambulatory Visit: Payer: BC Managed Care – PPO | Admitting: Obstetrics and Gynecology

## 2020-01-25 ENCOUNTER — Encounter: Payer: Self-pay | Admitting: Obstetrics and Gynecology

## 2020-01-25 VITALS — BP 120/78 | HR 88 | Temp 97.0°F | Ht 61.5 in | Wt 165.6 lb

## 2020-01-25 DIAGNOSIS — N950 Postmenopausal bleeding: Secondary | ICD-10-CM | POA: Diagnosis not present

## 2020-01-25 DIAGNOSIS — N83201 Unspecified ovarian cyst, right side: Secondary | ICD-10-CM | POA: Diagnosis not present

## 2020-01-25 DIAGNOSIS — D259 Leiomyoma of uterus, unspecified: Secondary | ICD-10-CM | POA: Diagnosis not present

## 2020-01-25 DIAGNOSIS — N83202 Unspecified ovarian cyst, left side: Secondary | ICD-10-CM | POA: Diagnosis not present

## 2020-01-25 DIAGNOSIS — N858 Other specified noninflammatory disorders of uterus: Secondary | ICD-10-CM | POA: Diagnosis not present

## 2020-01-25 DIAGNOSIS — D219 Benign neoplasm of connective and other soft tissue, unspecified: Secondary | ICD-10-CM

## 2020-01-25 NOTE — Patient Instructions (Addendum)
Ovarian Cyst     An ovarian cyst is a fluid-filled sac that forms on an ovary. The ovaries are small organs that produce eggs in women. Various types of cysts can form on the ovaries. Some may cause symptoms and require treatment. Most ovarian cysts go away on their own, are not cancerous (are benign), and do not cause problems. Common types of ovarian cysts include:  Functional (follicle) cysts. ? Occur during the menstrual cycle, and usually go away with the next menstrual cycle if you do not get pregnant. ? Usually cause no symptoms.  Endometriomas. ? Are cysts that form from the tissue that lines the uterus (endometrium). ? Are sometimes called "chocolate cysts" because they become filled with blood that turns brown. ? Can cause pain in the lower abdomen during intercourse and during your period.  Cystadenoma cysts. ? Develop from cells on the outside surface of the ovary. ? Can get very large and cause lower abdomen pain and pain with intercourse. ? Can cause severe pain if they twist or break open (rupture).  Dermoid cysts. ? Are sometimes found in both ovaries. ? May contain different kinds of body tissue, such as skin, teeth, hair, or cartilage. ? Usually do not cause symptoms unless they get very big.  Theca lutein cysts. ? Occur when too much of a certain hormone (human chorionic gonadotropin) is produced and overstimulates the ovaries to produce an egg. ? Are most common after having procedures used to assist with the conception of a baby (in vitro fertilization). What are the causes? Ovarian cysts may be caused by:  Ovarian hyperstimulation syndrome. This is a condition that can develop from taking fertility medicines. It causes multiple large ovarian cysts to form.  Polycystic ovarian syndrome (PCOS). This is a common hormonal disorder that can cause ovarian cysts, as well as problems with your period or fertility. What increases the risk? The following factors may  make you more likely to develop ovarian cysts:  Being overweight or obese.  Taking fertility medicines.  Taking certain forms of hormonal birth control.  Smoking. What are the signs or symptoms? Many ovarian cysts do not cause symptoms. If symptoms are present, they may include:  Pelvic pain or pressure.  Pain in the lower abdomen.  Pain during sex.  Abdominal swelling.  Abnormal menstrual periods.  Increasing pain with menstrual periods. How is this diagnosed? These cysts are commonly found during a routine pelvic exam. You may have tests to find out more about the cyst, such as:  Ultrasound.  X-ray of the pelvis.  CT scan.  MRI.  Blood tests. How is this treated? Many ovarian cysts go away on their own without treatment. Your health care provider may want to check your cyst regularly for 2-3 months to see if it changes. If you are in menopause, it is especially important to have your cyst monitored closely because menopausal women have a higher rate of ovarian cancer. When treatment is needed, it may include:  Medicines to help relieve pain.  A procedure to drain the cyst (aspiration).  Endometrial Biopsy, Care After This sheet gives you information about how to care for yourself after your procedure. Your health care provider may also give you more specific instructions. If you have problems or questions, contact your health care provider. What can I expect after the procedure? After the procedure, it is common to have: Mild cramping. A small amount of vaginal bleeding for a few days. This is normal. Follow these instructions  at home:  Take over-the-counter and prescription medicines only as told by your health care provider. Do not douche, use tampons, or have sexual intercourse until your health care provider approves. Return to your normal activities as told by your health care provider. Ask your health care provider what activities are safe for you. Follow  instructions from your health care provider about any activity restrictions, such as restrictions on strenuous exercise or heavy lifting. Contact a health care provider if: You have heavy bleeding, or bleed for longer than 2 days after the procedure. You have bad smelling discharge from your vagina. You have a fever or chills. You have a burning sensation when urinating or you have difficulty urinating. You have severe pain in your lower abdomen. Get help right away if: You have severe cramps in your stomach or back. You pass large blood clots. Your bleeding increases. You become weak or light-headed, or you pass out. Summary After the procedure, it is common to have mild cramping and a small amount of vaginal bleeding for a few days. Do not douche, use tampons, or have sexual intercourse until your health care provider approves. Return to your normal activities as told by your health care provider. Ask your health care provider what activities are safe for you. This information is not intended to replace advice given to you by your health care provider. Make sure you discuss any questions you have with your health care provider. Document Revised: 10/29/2017 Document Reviewed: 12/02/2016 Elsevier Patient Education  [place].  Surgery to remove the whole cyst.  Hormone treatment or birth control pills. These methods are sometimes used to help dissolve a cyst. Follow these instructions at home:  Take over-the-counter and prescription medicines only as told by your health care provider.  Do not drive or use heavy machinery while taking prescription pain medicine.  Get regular pelvic exams and Pap tests as often as told by your health care provider.  Return to your normal activities as told by your health care provider. Ask your health care provider what activities are safe for you.  Do not use any products that contain nicotine or tobacco, such as cigarettes and e-cigarettes.  If you need help quitting, ask your health care provider.  Keep all follow-up visits as told by your health care provider. This is important. Contact a health care provider if:  Your periods are late, irregular, or painful, or they stop.  You have pelvic pain that does not go away.  You have pressure on your bladder or trouble emptying your bladder completely.  You have pain during sex.  You have any of the following in your abdomen: ? A feeling of fullness. ? Pressure. ? Discomfort. ? Pain that does not go away. ? Swelling.  You feel generally ill.  You become constipated.  You lose your appetite.  You develop severe acne.  You start to have more body hair and facial hair.  You are gaining weight or losing weight without changing your exercise and eating habits.  You think you may be pregnant. Get help right away if:  You have abdominal pain that is severe or gets worse.  You cannot eat or drink without vomiting.  You suddenly develop a fever.  Your menstrual period is much heavier than usual. This information is not intended to replace advice given to you by your health care provider. Make sure you discuss any questions you have with your health care provider. Document Revised: 02/14/2018 Document Reviewed:  04/19/2016 Elsevier Patient Education  [place].

## 2020-01-25 NOTE — Progress Notes (Signed)
GYNECOLOGY  VISIT   HPI: 49 y.o.   Married  Caucasian  female   G2P0 with Patient's last menstrual period was 11/30/2016 (approximate).   here for pelvic ultrasound and possible EMB.    Patient is here for postcoital bleeding.   She is on HRT an dmissed some doses of estrogen and her testosterone cream.  No missed doses of progesterone.   Noted some LLQ pain also.   GYNECOLOGIC HISTORY: Patient's last menstrual period was 11/30/2016 (approximate). Contraception:  PMP Menopausal hormone therapy: Estradiol/Progesterone Last mammogram: 09-04-2019 [facility] 3 probably benign follow up 6 months  Last pap smear: 2-22-21Neg:Neg HR HPV, 09-12-18 Neg:Neg HR HPV, 03-21-15 Neg        OB History    Gravida  2   Para      Term      Preterm      AB      Living  1     SAB      TAB      Ectopic      Multiple      Live Births                 Patient Active Problem List   Diagnosis Date Noted  . Allergic conjunctivitis of both eyes 10/06/2019  . Elevated ALT measurement 01/28/2018  . Moderate persistent asthma with acute exacerbation 01/18/2018  . Allergic conjunctivitis 01/18/2018  . Elevated blood-pressure reading without diagnosis of hypertension 01/18/2018  . Non-seasonal allergic rhinitis due to pollen 05/03/2017  . Mild persistent asthma, uncomplicated 99991111  . Thyroid nodule, uninodular 09/28/2011  . Unspecified hypothyroidism 09/10/2011  . Allergic rhinitis 09/10/2011  . Asthma 04/25/2008  . Contact dermatitis and other eczema due to plants (except food) 08/04/2007    Past Medical History:  Diagnosis Date  . Angio-edema   . Asthma   . Cough   . Diabetes mellitus arising in pregnancy    now pre-diabetes  . Family history of breast cancer    MGM  . Fatigue   . Generalized headaches    migraines on occasion.  Berenice Primas disease   . Hyperlipidemia   . Hypothyroidism    s/p thyroidectomy for Grave's disease (Dr. Tye Savoy at [facility])  .  IBS (irritable bowel syndrome)   . Incontinence   . Migraine    without aura  . Night sweats   . Recurrent upper respiratory infection (URI)   . Sinus problem    runny nose  . SOB (shortness of breath)   . Urticaria     Past Surgical History:  Procedure Laterality Date  . ABDOMINOPLASTY  12/2015   Dr. Towanda Malkin  . BREAST REDUCTION SURGERY  11/03/2018  . BREAST SURGERY  10/2018   breast reduction  . CESAREAN SECTION    . DILATATION & CURETTAGE/HYSTEROSCOPY WITH TRUECLEAR N/A 01/16/2014   Procedure: DILATATION & CURETTAGE/HYSTEROSCOPY WITH TRUCLEAR;  Surgeon: Marylynn Pearson, MD;  Location: [facility];  Service: Gynecology;  Laterality: N/A;  . LASIK    . prk laser eye surgery Left 08/2019  . TOTAL THYROIDECTOMY  07/30/11   Dr. Harlow Asa (Grave's disease)  . VULVA Milagros Loll BIOPSY N/A 01/16/2014   Procedure: VULVAR BIOPSY;  Surgeon: Marylynn Pearson, MD;  Location: [facility];  Service: Gynecology;  Laterality: N/A;    Current Outpatient Medications  Medication Sig Dispense Refill  . acetaminophen (TYLENOL) 500 MG tablet Take 1,000 mg by mouth every 6 (six) hours as needed for headache. Reported on 06/17/2016    .  ASHWAGANDHA PO Take 300 mg by mouth 2 (two) times daily.    Jolyne Loa Grape-Goldenseal (BERBERINE COMPLEX PO) Take by mouth 2 (two) times daily.    . budesonide-formoterol (SYMBICORT) 160-4.5 MCG/ACT inhaler Two puffs with spacer device twice a day during asthma flares.  Rinse, gargle and spit after use. 1 Inhaler 5  . cholecalciferol (VITAMIN D) 1000 UNITS tablet Take 1,000 Units by mouth daily.     Marland Kitchen estradiol (ESTRACE) 1 MG tablet Take 1 tablet by mouth daily.    Marland Kitchen Fexofenadine HCl (ALLEGRA PO) Take by mouth.    . fluorometholone (FML) 0.1 % ophthalmic suspension Place 1 drop into the left eye 4 (four) times daily.    Marland Kitchen guaiFENesin (MUCINEX) 600 MG 12 hr tablet Take 600 mg by mouth 2 (two) times daily.    . hydrOXYzine (ATARAX/VISTARIL) 25 MG tablet     . ibuprofen  (ADVIL,MOTRIN) 200 MG tablet Take 600-800 mg by mouth every 6 (six) hours as needed.    Marland Kitchen ipratropium (ATROVENT) 0.03 % nasal spray 2 sprays per nostril every 8 hours if needed 30 mL 5  . levocetirizine (XYZAL) 5 MG tablet TAKE 1 TABLET ONCE DAILY IN THE EVENING. 90 tablet 1  . levothyroxine (SYNTHROID) 75 MCG tablet Take 75 mcg by mouth daily.    . metFORMIN (GLUCOPHAGE) 1000 MG tablet Take 1,000 mg by mouth daily.    . NON FORMULARY 306 mg daily. Estrogen Control    . Pregnenolone POWD 200 mg by Does not apply route daily.    Marland Kitchen PROAIR HFA 108 (90 Base) MCG/ACT inhaler INHALE 2 PUFFS EVERY 4 HOURS AS NEEDED 8.5 g 0  . progesterone (PROMETRIUM) 200 MG capsule     . QVAR REDIHALER 80 MCG/ACT inhaler INHALE 2 PUFFS TWICE DAILY 10.6 g 5  . Spacer/Aero-Holding Chambers [facility] DIAMOND) DEVI Use as directed with inhaler. Dx:  J45.40 Asthma 1 Device 2  . thyroid (NP THYROID) 90 MG tablet Take 1 tablet by mouth daily.     No current facility-administered medications for this visit.     ALLERGIES: Patient has no known allergies.  Family History  Problem Relation Age of Onset  . Cancer Father        prostate  . Allergic rhinitis Mother   . Asthma Mother   . Alpha-1 antitrypsin deficiency Mother   . COPD Mother   . Cancer Maternal Grandmother        pancreatic  . Diabetes Maternal Grandmother   . Heart disease Maternal Grandfather        CABG in 60's  . Diabetes Maternal Grandfather   . Diabetes Paternal Grandmother   . Diabetes Paternal Grandfather   . Heart disease Paternal Grandfather   . Angioedema Neg Hx   . Eczema Neg Hx     Social History   Socioeconomic History  . Marital status: Married    Spouse name: Not on file  . Number of children: 1  . Years of education: Not on file  . Highest education level: Not on file  Occupational History  . 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  Smoking status: Never Smoker  . Smokeless tobacco: Never Used  Substance and Sexual Activity  . Alcohol use: Yes    Comment: once a month  . Drug use: No  . Sexual activity: Yes    Partners: Male    Birth control/protection: None  Comment: postmenopausal (LMP3/2018)  Other Topics Concern  . Not on file  Social History Narrative   Divorced and re-married.  Lives with her husband.  Son is grown and lives in own apartment   Social Determinants of Health   Financial Resource Strain:   . Difficulty of Paying Living Expenses: Not on file  Food Insecurity:   . Worried About Charity fundraiser in the Last Year: Not on file  . Ran Out of Food in the Last Year: Not on file  Transportation Needs:   . Lack of Transportation (Medical): Not on file  . Lack of Transportation (Non-Medical): Not on file  Physical Activity:   . Days of Exercise per Week: Not on file  . Minutes of Exercise per Session: Not on file  Stress:   . Feeling of Stress : Not on file  Social Connections:   . Frequency of Communication with Friends and Family: Not on file  . Frequency of Social Gatherings with Friends and Family: Not on file  . Attends Religious Services: Not on file  . Active Member of Clubs or Organizations: Not on file  . Attends Archivist Meetings: Not on file  . Marital Status: Not on file  Intimate Partner Violence:   . Fear of Current or Ex-Partner: Not on file  . Emotionally Abused: Not on file  . Physically Abused: Not on file  . Sexually Abused: Not on file    Review of Systems  All other systems reviewed and are negative.   PHYSICAL EXAMINATION:    BP 120/78 (Cuff Size: Large)   Pulse 88   Temp (!) 97 F (36.1 C) (Temporal)   Ht 5' 1.5" (1.562 m)   Wt 165 lb 9.6 oz (75.1 kg)   LMP 11/30/2016 (Approximate)   BMI 30.78 kg/m     General appearance: alert, cooperative and appears stated age   Pelvic US Uterus with fibroids (3).  Larges it 0.97 cm.  EMS 3.29  mm. Bilateral ovaries with complex cysts - right ovarian cyst - 3.8 cm with papillations, largest 20 mm, left ovarian cyst - 3.0 cm with papillations, largest 15 mm.  No abnormal vascularization of ovaries.  No free fluid.    EMB Sterile prep with Hibiclens.  Paracervical block with 10 cc 1% lidocaine.  Lot [place] 2000-71, expiration 4/22.  Pipelle passed to 7 cm x 2.  Tissue to pathology.  No complications.  Minimal EBL.   Chaperone was present for exam.  ASSESSMENT  Postmenopausal bleeding.  Fibroids.  Bilateral complex ovarian cysts.  HRT.   PLAN  Pelvic ultrasound findings reviewed in detail.  In addition to ultrasound findings and endometrial biopsy, discussed of management of care reviewed.  Will check CA125 and CEA to evaluate cysts further.  I told patient that I expected she would have future surgical care for the ovarian cysts and have laparoscopic bilateral salpingo-oophorectomy (with pelvic washings) versus laparoscopic hysterectomy with bilateral salpingo-oophorectomy.  We discussed potential GYN ONC consultation.    An After Visit Summary was printed and given to the patient.  __20____ minutes face to face time of which over 50% was spent in counseling.

## 2020-01-26 LAB — CEA: CEA: 2 ng/mL (ref 0.0–4.7)

## 2020-01-26 LAB — CA 125: Cancer Antigen (CA) 125: 11.9 U/mL (ref 0.0–38.1)

## 2020-01-26 LAB — SURGICAL PATHOLOGY

## 2020-01-31 ENCOUNTER — Ambulatory Visit (INDEPENDENT_AMBULATORY_CARE_PROVIDER_SITE_OTHER): Payer: BC Managed Care – PPO

## 2020-01-31 ENCOUNTER — Telehealth: Payer: Self-pay | Admitting: Obstetrics and Gynecology

## 2020-01-31 DIAGNOSIS — N83201 Unspecified ovarian cyst, right side: Secondary | ICD-10-CM

## 2020-01-31 DIAGNOSIS — D219 Benign neoplasm of connective and other soft tissue, unspecified: Secondary | ICD-10-CM

## 2020-01-31 DIAGNOSIS — J309 Allergic rhinitis, unspecified: Secondary | ICD-10-CM

## 2020-01-31 DIAGNOSIS — N950 Postmenopausal bleeding: Secondary | ICD-10-CM

## 2020-01-31 NOTE — Telephone Encounter (Signed)
Please contact GYN ONC about potential consultation for my patient.   She presented with postmenopausal bleeding on HRT.  Her pelvic ultrasound showed thin endometrium 3.79 mm, small fibroids, and small bilateral complex ovarian cysts each with papillations.  Her CA125 is 11.9 and her CEA is 2.0.  Her endometrial biopsy shows atrophic endometrium.  Her pap is normal and her HR HPV is negative.   I would like to know if GYN ONC needs to see her if I can proceed with her surgical care.

## 2020-01-31 NOTE — Telephone Encounter (Signed)
Spoke to [place] at [employer] [facility]. Referral placed. Sharyn Lull, referral specialist to review and will contact [facility] with plans after referral notes reviewed.

## 2020-02-01 ENCOUNTER — Telehealth: Payer: Self-pay | Admitting: *Deleted

## 2020-02-01 NOTE — Telephone Encounter (Signed)
Called the patient and scheduled her for an appt on 3/12 to see Dr Berline Lopes. Gave the address and phone number for the office. Also gave the policy for mask, visitors and parking

## 2020-02-06 NOTE — Progress Notes (Signed)
GYNECOLOGIC ONCOLOGY NEW PATIENT CONSULTATION   Patient Name: Jacqueline Holmes  Patient Age: 49 y.o. Date of Service: 02/09/20 Referring Provider: [name], [address] [place],  [place] [address]   Primary Care Provider: [name], MD Consulting Provider: Jeral Pinch, MD   Assessment/Plan:  Menopausal patient with recent work-up for postmenopausal bleeding likely secondary to atrophy incidentally found to have small, bilateral complex ovarian masses.  I reviewed ultrasound findings with the patient and we discussed ultrasound features that increase our suspicion for a borderline or malignant process.  The presence of papillations is somewhat concerning.  Otherwise, the cysts lack of other concerning features such as septations or increased vascularity and are small in size.  While relatively asymptomatic, she has had some associated pelvic and GI symptoms.  We also discussed that her CEA and CA-125 are within normal limits.  Unfortunately, the Ca1 25 is neither sensitive nor specific.  Especially in early stage ovarian cancer, it may be normal and up to 50% of patients.  I have recommended close follow-up imaging with an ultrasound 6 weeks after her recent 1.  I have asked her to call the clinic if she develops new symptoms or worsening of her current symptoms.  Depending on results of the ultrasound, we will move forward with scheduling surgery if the masses are more concerning in appearance or have increased in size versus further imaging, such as a CT scan.  If overall reassuring results on future ultrasound, then we will not discuss any need for continued surveillance.  Given her family history, including pancreatic answer diagnosis in her father, I am putting in a referral for dilation with our genetic counselors.   A copy of this note was sent to the patient's referring provider.   45 minutes of total time was spent for this patient encounter, including preparation,  face-to-face counseling with the patient and coordination of care, and documentation of the encounter.   Jeral Pinch, MD  Division of Gynecologic Oncology  Department of Obstetrics and Gynecology  [facility]  ___________________________________________  Chief Complaint: Chief Complaint  Patient presents with  . Cyst of ovary, unspecified laterality    New patient    History of Present Illness:  Jacqueline Holmes is a 49 y.o. y.o. female who is seen in consultation at the request of [name], MD for an evaluation of bilateral complex ovarian masses.  Patient had her last menstrual period in early 2018.  She was recently seen for postcoital bleeding which was the first postmenopausal bleeding that she had had.  She denies any other vaginal bleeding or spotting.  She was seen on 2/25 and underwent an endometrial biopsy showing scant atrophic appearing endometrium.  She had one episode of spotting a day after the biopsy but denies any further bleeding since including after intercourse.  Additionally, she had a pelvic ultrasound showing a thin endometrial lining measuring 3.3 mm.  Her recent pelvic ultrasound showed several small (less than 1 cm) uterine fibroids.  Additionally, she was noted to have bilateral complex ovarian cysts, the right measuring 3.8 cm and the left 3.0 cm.  Both noted to have papillations.  No septations, abnormal vasculature or free fluid noted.  She had tumor markers including CEA and CA-125 drawn which were both within normal limits.  She has been on hormone replacement therapy with daily oral estrogen and progesterone as well as daily topical testosterone.  She has been on this regimen for the last 1.5 to 2  years with no recent change in her doses.  Overall, she reports feeling well.  She has occasional pain, more frequently on the left than right in her low pelvis.  She describes this pain as sharp, lasting for seconds, and occurring 2-3  times a day on average.  It stops her if she is doing anything but does not last long.  She endorses a good appetite without any nausea or emesis.  She denies early satiety.  She has felt mildly bloated the last several weeks.  She is having bowel movements a little bit less frequently than normal for her, saying about twice a week now.  She notes normal quantity and consistency of bowel movements.  She thinks she may have some increased urinary frequency but is unsure  PAST MEDICAL HISTORY:  Past Medical History:  Diagnosis Date  . Angio-edema   . Asthma   . Cough   . Diabetes mellitus arising in pregnancy    now pre-diabetes  . Family history of breast cancer    MGM  . Fatigue   . Generalized headaches    migraines on occasion.  Berenice Primas disease   . Hyperlipidemia   . Hypothyroidism    s/p thyroidectomy for Grave's disease (Dr. Tye Savoy at [facility])  . IBS (irritable bowel syndrome)   . Incontinence   . Migraine    without aura  . Night sweats   . Recurrent upper respiratory infection (URI)   . Sinus problem    runny nose  . SOB (shortness of breath)   . Urticaria      PAST SURGICAL HISTORY:  Past Surgical History:  Procedure Laterality Date  . ABDOMINOPLASTY  12/2015   Dr. Towanda Malkin  . BREAST REDUCTION SURGERY  11/03/2018  . BREAST SURGERY  10/2018   breast reduction  . CESAREAN SECTION    . DILATATION & CURETTAGE/HYSTEROSCOPY WITH TRUECLEAR N/A 01/16/2014   Procedure: DILATATION & CURETTAGE/HYSTEROSCOPY WITH TRUCLEAR;  Surgeon: Marylynn Pearson, MD;  Location: [facility];  Service: Gynecology;  Laterality: N/A;  . LASIK    . prk laser eye surgery Left 08/2019  . TOTAL THYROIDECTOMY  07/30/11   Dr. Harlow Asa (Grave's disease)  . VULVA Milagros Loll BIOPSY N/A 01/16/2014   Procedure: VULVAR BIOPSY;  Surgeon: Marylynn Pearson, MD;  Location: [facility];  Service: Gynecology;  Laterality: N/A;    OB/GYN HISTORY:  OB History  Gravida Para Term Preterm AB Living  2 1        1   SAB TAB Ectopic Multiple Live Births               # Outcome Date GA Lbr Len/2nd Weight Sex Delivery Anes PTL Lv  2 Gravida           1 Para             Patient's last menstrual period was 01/28/2017.  Age at menarche: 3-13 Age at menopause: 1 Hx of HRT: See HPI Hx of STDs: No Last pap: 01/2020 - negative, HPV negative History of abnormal pap smears: No  SCREENING STUDIES:  Last mammogram:08/2019  Last colonoscopy: N/A but has done a Cologuard card in the last year  MEDICATIONS: Outpatient Encounter Medications as of 02/09/2020  Medication Sig  . acetaminophen (TYLENOL) 500 MG tablet Take 1,000 mg by mouth every 6 (six) hours as needed for headache. Reported on 06/17/2016  . ASHWAGANDHA PO Take 300 mg by mouth 2 (two) times daily.  Jolyne Loa Grape-Goldenseal (BERBERINE COMPLEX PO) Take  by mouth 2 (two) times daily.  . budesonide-formoterol (SYMBICORT) 160-4.5 MCG/ACT inhaler Two puffs with spacer device twice a day during asthma flares.  Rinse, gargle and spit after use.  . cholecalciferol (VITAMIN D) 1000 UNITS tablet Take 1,000 Units by mouth daily.   Marland Kitchen estradiol (ESTRACE) 1 MG tablet Take 1 tablet by mouth daily.  Marland Kitchen Fexofenadine HCl (ALLEGRA PO) Take by mouth.  Marland Kitchen guaiFENesin (MUCINEX) 600 MG 12 hr tablet Take 600 mg by mouth 2 (two) times daily.  . hydrOXYzine (ATARAX/VISTARIL) 25 MG tablet as needed.   Marland Kitchen ibuprofen (ADVIL,MOTRIN) 200 MG tablet Take 600-800 mg by mouth every 6 (six) hours as needed.  Marland Kitchen ipratropium (ATROVENT) 0.03 % nasal spray 2 sprays per nostril every 8 hours if needed  . levocetirizine (XYZAL) 5 MG tablet TAKE 1 TABLET ONCE DAILY IN THE EVENING.  Marland Kitchen levothyroxine (SYNTHROID) 75 MCG tablet Take 75 mcg by mouth daily.  . metFORMIN (GLUCOPHAGE) 1000 MG tablet Take 1,000 mg by mouth daily.  . NON FORMULARY 306 mg daily. Estrogen Control  . Pregnenolone POWD 200 mg by Does not apply route daily.  Marland Kitchen PROAIR HFA 108 (90 Base) MCG/ACT inhaler INHALE 2  PUFFS EVERY 4 HOURS AS NEEDED  . progesterone (PROMETRIUM) 200 MG capsule   . QVAR REDIHALER 80 MCG/ACT inhaler INHALE 2 PUFFS TWICE DAILY  . Spacer/Aero-Holding [facility] DIAMOND) DEVI Use as directed with inhaler. Dx:  J45.40 Asthma  . thyroid (NP THYROID) 90 MG tablet Take 1 tablet by mouth daily.  . [DISCONTINUED] fluorometholone (FML) 0.1 % ophthalmic suspension Place 1 drop into the left eye 4 (four) times daily.   No facility-administered encounter medications on file as of 02/09/2020.    ALLERGIES:  No Known Allergies   FAMILY HISTORY:  Family History  Problem Relation Age of Onset  . Cancer Father        prostate  . Allergic rhinitis Mother   . Asthma Mother   . Alpha-1 antitrypsin deficiency Mother   . COPD Mother   . Cancer Maternal Grandmother        pancreatic  . Diabetes Maternal Grandmother   . Heart disease Maternal Grandfather        CABG in 60's  . Diabetes Maternal Grandfather   . Diabetes Paternal Grandmother   . Diabetes Paternal Grandfather   . Heart disease Paternal Grandfather   . Angioedema Neg Hx   . Eczema Neg Hx      SOCIAL HISTORY:    Social Connections:   . Frequency of Communication with Friends and Family:   . Frequency of Social Gatherings with Friends and Family:   . Attends Religious Services:   . Active Member of Clubs or Organizations:   . Attends Archivist Meetings:   Marland Kitchen Marital Status:     REVIEW OF SYSTEMS:  Positives as per HPI Denies appetite changes, fevers, chills, fatigue, unexplained weight changes. Denies hearing loss, neck lumps or masses, mouth sores, ringing in ears or voice changes. Denies cough or wheezing.  Denies shortness of breath. Denies chest pain or palpitations. Denies leg swelling. Denies blood in stools, diarrhea, nausea, vomiting, or early satiety. Denies pain with intercourse, dysuria, frequency, hematuria or incontinence. Denies hot flashes or vaginal discharge.   Denies joint  pain, back pain or muscle pain/cramps. Denies itching, rash, or wounds. Denies dizziness, headaches, numbness or seizures. Denies swollen lymph nodes or glands, denies easy bruising or bleeding. Denies anxiety, depression, confusion, or decreased concentration.  Physical  Exam:  Vital Signs for this encounter:  Blood pressure (!) 129/96, pulse (!) 108, temperature 98.5 F (36.9 C), temperature source Temporal, resp. rate 16, height 5' 1.5" (1.562 m), weight 166 lb (75.3 kg), last menstrual period 01/28/2017, SpO2 99 %. Body mass index is 30.86 kg/m. General: Alert, oriented, no acute distress.  HEENT: Normocephalic, atraumatic. Sclera anicteric.  Chest: Clear to auscultation bilaterally. No wheezes, rhonchi, or rales. Cardiovascular: Regular rate and rhythm, no murmurs, rubs, or gallops.  Abdomen: Obese. Normoactive bowel sounds. Soft, nondistended, nontender to palpation. No masses or hepatosplenomegaly appreciated. No palpable fluid wave.  Healed scar around the umbilicus as well as horizontal lower abdominal incision. Extremities: Grossly normal range of motion. Warm, well perfused. No edema bilaterally.  Skin: No rashes or lesions.  Lymphatics: No cervical, supraclavicular, or inguinal adenopathy.  GU:  Normal external female genitalia. No lesions. No discharge or bleeding.             Bladder/urethra:  No lesions or masses, well supported bladder             Vagina: Mildly atrophic, no lesions or masses.  No bleeding or discharge noted.             Cervix: Normal appearing, no lesions.  Anterior.             Uterus: Small, mobile, retroverted, no parametrial involvement or nodularity.             Adnexa: Not definitively appreciated.  Rectal: No nodularity, uterine fundus palpable in the cul-de-sac.  LABORATORY AND RADIOLOGIC DATA:  Outside medical records were reviewed to synthesize the above history, along with the history and physical obtained during the visit.   Lab Results   Component Value Date   WBC 7.5 07/03/2019   HGB 14.7 07/03/2019   HCT 43.7 07/03/2019   PLT 336 07/03/2019   GLUCOSE 129 (H) 07/03/2019   CHOL 147 05/16/2018   TRIG 90 05/16/2018   HDL 39 (L) 05/16/2018   LDLCALC 90 05/16/2018   ALT 24 07/03/2019   AST 17 07/03/2019   NA 143 07/03/2019   K 4.7 07/03/2019   CL 103 07/03/2019   CREATININE 0.74 07/03/2019   BUN 20 07/03/2019   CO2 23 07/03/2019   INR 1.04 07/23/2011   EMB on 2/25: A. ENDOMETRIUM, BIOPSY:  - Scant atrophic-appearing endometrium present in the background of a  majority of endocervical glandular and squamous epithelium. No  malignancy identified.  CA-125: 11.9 CEA: 2   Pelvic US Uterus with fibroids (3).  Larges it 0.97 cm.  EMS 3.29 mm. Bilateral ovaries with complex cysts - right ovarian cyst - 3.8 cm with papillations, largest 20 mm, left ovarian cyst - 3.0 cm with papillations, largest 15 mm.  No abnormal vascularization of ovaries.  No free fluid.

## 2020-02-08 ENCOUNTER — Telehealth: Payer: Self-pay | Admitting: *Deleted

## 2020-02-08 NOTE — Telephone Encounter (Signed)
Called the patient and moved her appt from 2pm tomorrow to 12pm

## 2020-02-09 ENCOUNTER — Ambulatory Visit: Payer: 59 | Admitting: Family Medicine

## 2020-02-09 ENCOUNTER — Inpatient Hospital Stay: Payer: BC Managed Care – PPO | Attending: Gynecologic Oncology | Admitting: Gynecologic Oncology

## 2020-02-09 ENCOUNTER — Other Ambulatory Visit: Payer: Self-pay

## 2020-02-09 ENCOUNTER — Encounter: Payer: Self-pay | Admitting: Gynecologic Oncology

## 2020-02-09 VITALS — BP 129/96 | HR 108 | Temp 98.5°F | Resp 16 | Ht 61.5 in | Wt 166.0 lb

## 2020-02-09 DIAGNOSIS — J45909 Unspecified asthma, uncomplicated: Secondary | ICD-10-CM | POA: Insufficient documentation

## 2020-02-09 DIAGNOSIS — Z8349 Family history of other endocrine, nutritional and metabolic diseases: Secondary | ICD-10-CM | POA: Insufficient documentation

## 2020-02-09 DIAGNOSIS — Z8042 Family history of malignant neoplasm of prostate: Secondary | ICD-10-CM

## 2020-02-09 DIAGNOSIS — Z800 Family history of malignant neoplasm of digestive organs: Secondary | ICD-10-CM | POA: Diagnosis not present

## 2020-02-09 DIAGNOSIS — E039 Hypothyroidism, unspecified: Secondary | ICD-10-CM | POA: Diagnosis not present

## 2020-02-09 DIAGNOSIS — N950 Postmenopausal bleeding: Secondary | ICD-10-CM | POA: Insufficient documentation

## 2020-02-09 DIAGNOSIS — Z7984 Long term (current) use of oral hypoglycemic drugs: Secondary | ICD-10-CM | POA: Insufficient documentation

## 2020-02-09 DIAGNOSIS — N83201 Unspecified ovarian cyst, right side: Secondary | ICD-10-CM

## 2020-02-09 DIAGNOSIS — Z79899 Other long term (current) drug therapy: Secondary | ICD-10-CM | POA: Insufficient documentation

## 2020-02-09 DIAGNOSIS — Z7951 Long term (current) use of inhaled steroids: Secondary | ICD-10-CM | POA: Insufficient documentation

## 2020-02-09 DIAGNOSIS — N83202 Unspecified ovarian cyst, left side: Secondary | ICD-10-CM | POA: Diagnosis not present

## 2020-02-09 NOTE — Patient Instructions (Signed)
It was a pleasure meeting you today.  Overall your exam is reassuring but there are some concerning features of your ovarian cyst on ultrasound.  I would like to have close follow-up with an ultrasound planned for early April.  I will call you afterwards to review the results and discuss any further steps necessary.  If ovaries have increased in size or more concerning on imaging, then we will likely move forward with planning surgery.  We also may talk about the need for additional imaging such as a CT scan.  If you have any change in your symptoms prior to your ultrasound, please call at [phone].

## 2020-02-13 ENCOUNTER — Other Ambulatory Visit: Payer: Self-pay | Admitting: Allergy and Immunology

## 2020-02-13 DIAGNOSIS — J45901 Unspecified asthma with (acute) exacerbation: Secondary | ICD-10-CM

## 2020-02-15 ENCOUNTER — Telehealth: Payer: Self-pay | Admitting: *Deleted

## 2020-02-15 NOTE — Telephone Encounter (Signed)
Called the patient and scheduled a genetics appt for next week

## 2020-02-16 ENCOUNTER — Other Ambulatory Visit: Payer: Self-pay

## 2020-02-16 ENCOUNTER — Encounter: Payer: Self-pay | Admitting: Family Medicine

## 2020-02-16 ENCOUNTER — Ambulatory Visit: Payer: BC Managed Care – PPO | Admitting: Family Medicine

## 2020-02-16 VITALS — BP 130/84 | HR 96 | Temp 98.1°F | Resp 16

## 2020-02-16 DIAGNOSIS — J309 Allergic rhinitis, unspecified: Secondary | ICD-10-CM | POA: Diagnosis not present

## 2020-02-16 DIAGNOSIS — H1013 Acute atopic conjunctivitis, bilateral: Secondary | ICD-10-CM

## 2020-02-16 DIAGNOSIS — J4540 Moderate persistent asthma, uncomplicated: Secondary | ICD-10-CM

## 2020-02-16 MED ORDER — PROAIR HFA 108 (90 BASE) MCG/ACT IN AERS: 2.0000 | INHALATION_SPRAY | RESPIRATORY_TRACT | 1 refills | Status: DC | PRN

## 2020-02-16 NOTE — Patient Instructions (Addendum)
Asthma Continue Qvar 80-2 puffs twice a day with a spacer to prevent cough or wheeze Continue albuterol 2 puffs every 4 hours with a spacer as needed for cough or wheeze For asthma flares, begin Symbicort 160-2 puffs twice a day with a spacer for 2 weeks or until back to breathing baseline. Stop Qvar while using Symbicort 160  Allergic rhinitis Continue Xyzal 5 mg once a day as needed for a runny nose Continue Nasacort 1-2 sprays in each nostril once a day for a stuffy nose Continue azelastine nasal spray 2 sprays in each nostril once a day as needed for a runny nose For thick post nasal drainage, begin Mucinex [phone] mg twice a dayConsider saline nasal rinses as needed for nasal symptoms. Use this before any medicated nasal sprays for best result  Allergic conjunctivitis Continue Pazeo eye drops one drop in each eye once a day as needed for red, itchy eyes. Some over the counter options include Zaditor 1 drop in each eye twice a day as needed for red, itchy eyes OR Opcon-A 2 drops in each eye up to 4 times a day as needed for red, itchy eyes  Call the clinic if this treatment plan is not working well for you  Follow up in 6 months or sooner if needed.

## 2020-02-16 NOTE — Progress Notes (Signed)
[name] [address] Dept: [phone]  FOLLOW UP NOTE  Patient ID: Jacqueline Holmes, female    DOB: 16-Mar-1971  Age: 49 y.o. MRN: [id number] Date of Office Visit: 02/16/2020  Assessment  Chief Complaint: Asthma  HPI Jacqueline Holmes is a 49 year old female who presents to the clinic for follow-up visit.  She was last seen in this clinic 12/08/2019 for evaluation of asthma with exacerbation requiring prednisone, allergic rhinitis on immunotherapy, and allergic conjunctivitis.  Today's visit, she reports her asthma has been well controlled with no shortness of breath or wheeze with rest or activity.  She reports that she " always has a dry cough".  She is currently using Qvar 80-2 puffs twice a day and has not used her albuterol since her last visit to this clinic.  Allergic rhinitis is reported as moderately well controlled with nasal congestion is the main symptom.  She continues Xyzal once a day in the evening and Allegra once a day in the morning.  She reports this combination works well for her and if she misses a day she begins sneezing uncontrollably.  She is unable to use any nasal sprays including Nasacort, azelastine, and her Atrovent due to reported nasal swelling.  She is not currently taking Mucinex.  She continues allergen immunotherapy with no large local reactions.  She reports her is symptoms of allergic rhinitis are significantly reduced while continuing on allergen immunotherapy.  Allergic conjunctivitis is reported as well controlled.  Her current medications are listed in the chart.   Drug Allergies:  No Known Allergies  Physical Exam: BP 130/84 (BP Location: Right Arm, Patient Position: Sitting, Cuff Size: Normal)   Pulse 96   Temp 98.1 F (36.7 C) (Temporal)   Resp 16   LMP 01/28/2017   SpO2 98%    Physical Exam Vitals reviewed.  Constitutional:      Appearance: Normal appearance.  HENT:     Head: Normocephalic and atraumatic.     Right Ear:  Tympanic membrane normal.     Left Ear: Tympanic membrane normal.     Nose:     Comments: Bilateral nares slightly erythematous with clear nasal drainage noted.  Pharynx normal.  Ears normal.  Eyes normal.    Mouth/Throat:     Pharynx: Oropharynx is clear.  Eyes:     Conjunctiva/sclera: Conjunctivae normal.  Cardiovascular:     Rate and Rhythm: Normal rate and regular rhythm.     Heart sounds: Normal heart sounds. No murmur.  Pulmonary:     Effort: Pulmonary effort is normal.     Breath sounds: Normal breath sounds.     Comments: Lungs clear to auscultation Musculoskeletal:        General: Normal range of motion.     Cervical back: Normal range of motion and neck supple.  Skin:    General: Skin is warm and dry.  Neurological:     Mental Status: She is alert and oriented to person, place, and time.  Psychiatric:        Mood and Affect: Mood normal.        Behavior: Behavior normal.        Thought Content: Thought content normal.        Judgment: Judgment normal.     Diagnostics: FVC 2.54, FEV1 1.85.  Predicted FVC 3.20, predicted FEV1 2.55.  Spirometry indicates mild restriction.  This is consistent with previous spirometry reading.  Assessment and Plan: 1. Moderate persistent asthma without complication  2. Allergic rhinitis, unspecified seasonality, unspecified trigger   3. Allergic conjunctivitis of both eyes     Meds ordered this encounter  Medications  . PROAIR HFA 108 (90 Base) MCG/ACT inhaler    Sig: Inhale 2 puffs into the lungs every 4 (four) hours as needed.    Dispense:  8.5 g    Refill:  1    Patient Instructions  Asthma Continue Qvar 80-2 puffs twice a day with a spacer to prevent cough or wheeze Continue albuterol 2 puffs every 4 hours with a spacer as needed for cough or wheeze For asthma flares, begin Symbicort 160-2 puffs twice a day with a spacer for 2 weeks or until back to breathing baseline. Stop Qvar while using Symbicort 160  Allergic  rhinitis Continue Xyzal 5 mg once a day as needed for a runny nose Continue Nasacort 1-2 sprays in each nostril once a day for a stuffy nose Continue azelastine nasal spray 2 sprays in each nostril once a day as needed for a runny nose For thick post nasal drainage, begin Mucinex [phone] mg twice a dayConsider saline nasal rinses as needed for nasal symptoms. Use this before any medicated nasal sprays for best result  Allergic conjunctivitis Continue Pazeo eye drops one drop in each eye once a day as needed for red, itchy eyes. Some over the counter options include Zaditor 1 drop in each eye twice a day as needed for red, itchy eyes OR Opcon-A 2 drops in each eye up to 4 times a day as needed for red, itchy eyes  Call the clinic if this treatment plan is not working well for you  Follow up in 6 months or sooner if needed.   Return in about 6 months (around 08/18/2020), or if symptoms worsen or fail to improve.    Thank you for the opportunity to care for this patient.  Please do not hesitate to contact me with questions.  Gareth Morgan, FNP [facility]

## 2020-02-20 ENCOUNTER — Inpatient Hospital Stay (HOSPITAL_BASED_OUTPATIENT_CLINIC_OR_DEPARTMENT_OTHER): Payer: BC Managed Care – PPO | Admitting: Genetic Counselor

## 2020-02-20 DIAGNOSIS — Z8042 Family history of malignant neoplasm of prostate: Secondary | ICD-10-CM

## 2020-02-20 DIAGNOSIS — Z1379 Encounter for other screening for genetic and chromosomal anomalies: Secondary | ICD-10-CM

## 2020-02-20 DIAGNOSIS — Z800 Family history of malignant neoplasm of digestive organs: Secondary | ICD-10-CM

## 2020-02-21 ENCOUNTER — Encounter: Payer: Self-pay | Admitting: Genetic Counselor

## 2020-02-21 DIAGNOSIS — Z803 Family history of malignant neoplasm of breast: Secondary | ICD-10-CM | POA: Insufficient documentation

## 2020-02-21 DIAGNOSIS — Z8042 Family history of malignant neoplasm of prostate: Secondary | ICD-10-CM | POA: Insufficient documentation

## 2020-02-21 DIAGNOSIS — Z800 Family history of malignant neoplasm of digestive organs: Secondary | ICD-10-CM | POA: Insufficient documentation

## 2020-02-21 NOTE — Progress Notes (Signed)
REFERRING PROVIDER: Lafonda Mosses, MD [place],  [place] [address]  PRIMARY PROVIDER:  [name], MD  PRIMARY REASON FOR VISIT:  1. Family history of prostate cancer   2. Family history of pancreatic cancer      I connected with Jacqueline Holmes on 02/21/2020 at 2:00 pm EDT by webex video conference and verified that I am speaking with the correct person using two identifiers.   Patient location: work Provider location: [place] office  HISTORY OF PRESENT ILLNESS:   Jacqueline Holmes, a 49 y.o. female, was seen for a [facility] cancer genetics consultation at the request of Dr. Berline Lopes due to a family history of pancreatic and prostate cancer.  Jacqueline Holmes presents to clinic today to discuss the possibility of a hereditary predisposition to cancer, genetic testing, and to further clarify her future cancer risks, as well as potential cancer risks for family members.   Jacqueline Holmes is a 49 y.o. female with no personal history of cancer.    CANCER HISTORY:  Oncology History   No history exists.    RISK FACTORS:  Menarche was at age 38-13.  First live birth at age 52.  OCP use for approximately 10 years.  Ovaries intact: yes.  Hysterectomy: no.  Menopausal status: postmenopausal.  HRT use: 2-3 years. Colonoscopy: cologuard normal within last year. Mammogram within the last year: yes. Number of breast biopsies: 1 - benign (7-8 years ago, per patient). Any excessive radiation exposure in the past: no   Past Medical History:  Diagnosis Date  . Angio-edema   . Asthma   . Cough   . Diabetes mellitus arising in pregnancy    now pre-diabetes  . Family history of breast cancer    MGM  . Family history of pancreatic cancer   . Family history of prostate cancer   . Fatigue   . Generalized headaches    migraines on occasion.  Berenice Primas disease   . Hyperlipidemia   . Hypothyroidism    s/p thyroidectomy for Grave's disease (Dr. Tye Savoy at [facility])  . IBS  (irritable bowel syndrome)   . Incontinence   . Migraine    without aura  . Night sweats   . Recurrent upper respiratory infection (URI)   . Sinus problem    runny nose  . SOB (shortness of breath)   . Urticaria     Past Surgical History:  Procedure Laterality Date  . ABDOMINOPLASTY  12/2015   Dr. Towanda Malkin  . BREAST REDUCTION SURGERY  11/03/2018  . BREAST SURGERY  10/2018   breast reduction  . CESAREAN SECTION    . DILATATION & CURETTAGE/HYSTEROSCOPY WITH TRUECLEAR N/A 01/16/2014   Procedure: DILATATION & CURETTAGE/HYSTEROSCOPY WITH TRUCLEAR;  Surgeon: Marylynn Pearson, MD;  Location: [facility];  Service: Gynecology;  Laterality: N/A;  . LASIK    . prk laser eye surgery Left 08/2019  . TOTAL THYROIDECTOMY  07/30/11   Dr. Harlow Asa (Grave's disease)  . VULVA Milagros Loll BIOPSY N/A 01/16/2014   Procedure: VULVAR BIOPSY;  Surgeon: Marylynn Pearson, MD;  Location: [facility];  Service: Gynecology;  Laterality: N/A;    Social History   Socioeconomic History  . Marital status: Married    Spouse name: Not on file  . Number of children: 1  . Years of education: Not on file  . Highest education level: Not on file  Occupational History  . 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 Smoking status: Never Smoker  . Smokeless tobacco: Never Used  Substance and Sexual Activity  . Alcohol use: Yes    Comment: once a month  . Drug use: No  . Sexual activity: Yes    Partners: Male    Birth control/protection: None    Comment: postmenopausal (LMP3/2018)  Other Topics Concern  . Not on file  Social History Narrative   Divorced and re-married.  Lives with her husband.  Son is grown and lives in own apartment   Social Determinants of Health   Financial Resource Strain:   . Difficulty of Paying Living Expenses:   Food Insecurity:   . Worried About Charity fundraiser in the Last Year:   . Arboriculturist in the  Last Year:   Transportation Needs:   . Film/video editor (Medical):   Marland Kitchen Lack of Transportation (Non-Medical):   Physical Activity:   . Days of Exercise per Week:   . Minutes of Exercise per Session:   Stress:   . Feeling of Stress :   Social Connections:   . Frequency of Communication with Friends and Family:   . Frequency of Social Gatherings with Friends and Family:   . Attends Religious Services:   . Active Member of Clubs or Organizations:   . Attends Archivist Meetings:   Marland Kitchen Marital Status:      FAMILY HISTORY:  We obtained a detailed, 4-generation family history.  Significant diagnoses are listed below: Family History  Problem Relation Age of Onset  . Prostate cancer Father        dx. in his 58s  . Allergic rhinitis Mother   . Asthma Mother   . Alpha-1 antitrypsin deficiency Mother   . COPD Mother   . Diabetes Maternal Grandmother   . Pancreatic cancer Maternal Grandmother        dx. in her early 54s  . Heart disease Maternal Grandfather        CABG in 60's  . Diabetes Maternal Grandfather   . Diabetes Paternal Grandmother   . Diabetes Paternal Grandfather   . Heart disease Paternal Grandfather   . Prostate cancer Paternal Uncle        dx. in his 31s  . Angioedema Neg Hx   . Eczema Neg Hx    [name] has one son who is 6 and has not had cancer. She has a brother who is 62 and also has not had cancer.  Jacqueline Holmes mother is 51 years old and does not have a history of cancer. She notes that her mother does have a genetic lung disease (possibly alpha-1 antitrypsin deficiency) which was confirmed with genetic testing. Jacqueline Holmes has two maternal aunts and one maternal uncle. Her uncle died in his early-30s and did not have cancer. Her maternal aunts also have not had cancer. Her maternal grandmother died in her early 26s from pancreatic cancer. Jacqueline Holmes notes that her grandmother chewed tobacco, but did not smoke. Her maternal grandfather died in his  mid-41s and did not have cancer.  Jacqueline Holmes father is 21 years old and has a history of prostate cancer diagnosed in his early- to [place], which was treated with surgery. She has four paternal uncles. One of her uncles had prostate cancer diagnosed in his 81s which was treated with chemotherapy. Her other uncles are in their early-60s to late-70s. One of her paternal uncles had a granddaughter who had leukemia diagnosed when  she was 35 or 50 years old. Jacqueline Holmes paternal grandmother died at age 33, and her paternal grandfather died at age 75.  Jacqueline Holmes is unaware of previous family history of genetic testing for hereditary cancer risks. Her maternal ancestors are of [place] descent, and paternal ancestors are of [place] descent. There is no reported Ashkenazi Jewish ancestry. There is no known consanguinity.  GENETIC COUNSELING ASSESSMENT: Jacqueline Holmes is a 49 y.o. female with a family history of prostate cancer and pancreatic cancer, which is not highly suggestive of a hereditary cancer syndrome in Jacqueline Holmes. We, therefore, discussed and recommended the following at today's visit.   DISCUSSION:  We discussed that, in general, most cancer is not inherited in families, but instead is sporadic or familial. Sporadic cancers occur by chance and typically happen at older ages (>50 years) as this type of cancer is caused by genetic changes acquired during an individual's lifetime. Some families have more cancers than would be expected by chance; however, the ages or types of cancer are not consistent with a known genetic mutation or known genetic mutations have been ruled out. This type of familial cancer is thought to be due to a combination of multiple genetic, environmental, hormonal, and lifestyle factors. While this combination of factors likely increases the risk of cancer, the exact source of this risk is not currently identifiable or testable.    We discussed that approximately 5-10% of cancer is  hereditary, meaning that it is due to a mutation in a single gene that is passed down from generation to generation in a family. We discussed that testing for these hereditary cancer syndromes can be beneficial for several reasons, including knowing about other cancer risks, identifying potential screening and risk-reduction options that may be appropriate, and to understand if other family members could be at risk for cancer and allow them to undergo genetic testing.   We reviewed the characteristics, features and inheritance patterns of hereditary cancer syndromes. We also discussed genetic testing, including the appropriate family members to test, the process of testing, and insurance coverage.  We discussed with Jacqueline Holmes that the family history does not meet insurance or NCCN criteria for genetic testing and, therefore, is not highly consistent with a familial hereditary cancer syndrome.  We feel she is at low risk to harbor a gene mutation associated with such a condition. Despite that she does not meet criteria, Jacqueline Holmes is still interested in proceeding with genetic testing. The out of pocket cost will be $250. We discussed the implications of a negative, positive and/or variant of uncertain significant result. Given Jacqueline Holmes's interest in testing, we recommended that she pursue genetic testing for the [facility] Multi-Cancer panel.  The Multi-Cancer Panel offered by [facility] includes sequencing and/or deletion duplication testing of the following 85 genes: AIP, ALK, APC, ATM, AXIN2,BAP1,  BARD1, BLM, BMPR1A, BRCA1, BRCA2, BRIP1, CASR, CDC73, CDH1, CDK4, CDKN1B, CDKN1C, CDKN2A (p14ARF), CDKN2A (p16INK4a), CEBPA, CHEK2, CTNNA1, DICER1, DIS3L2, EGFR (c.2369C>T, p.Thr790Met variant only), EPCAM (Deletion/duplication testing only), FH, FLCN, GATA2, GPC3, GREM1 (Promoter region deletion/duplication testing only), HOXB13 (c.251G>A, p.Gly84Glu), HRAS, KIT, MAX, MEN1, MET, MITF (c.952G>A, p.Glu318Lys variant  only), MLH1, MSH2, MSH3, MSH6, MUTYH, NBN, NF1, NF2, NTHL1, PALB2, PDGFRA, PHOX2B, PMS2, POLD1, POLE, POT1, PRKAR1A, PTCH1, PTEN, RAD50, RAD51C, RAD51D, RB1, RECQL4, RET, RNF43, RUNX1, SDHAF2, SDHA (sequence changes only), SDHB, SDHC, SDHD, SMAD4, SMARCA4, SMARCB1, SMARCE1, STK11, SUFU, TERC, TERT, TMEM127, TP53, TSC1, TSC2, VHL, WRN and WT1.    We discussed that some  people do not want to undergo genetic testing due to fear of genetic discrimination.  A federal law called the Genetic Information Non-Discrimination Act (GINA) of 2008 helps protect individuals against genetic discrimination based on their genetic test results.  It impacts both health insurance and employment.  With health insurance, it protects against increased premiums, being kicked off insurance or being forced to take a test in order to be insured.  For employment it protects against hiring, firing and promoting decisions based on genetic test results.  Health status due to a cancer diagnosis is not protected under GINA.  Additionally, life, disability, and long-term care insurance is not protected under GINA.   PLAN: After considering the risks, benefits, and limitations, Jacqueline Holmes provided informed consent to pursue genetic testing and the blood sample was sent to [facility] for analysis of the Multi-Cancer Panel. Results should be available within approximately two-three weeks' time, at which point they will be disclosed by telephone to Jacqueline Holmes, as will any additional recommendations warranted by these results. Ms. Grajeda will receive a summary of her genetic counseling visit and a copy of her results once available. This information will also be available in Epic.   Ms. Biscardi questions were answered to her satisfaction today. Our contact information was provided should additional questions or concerns arise. Thank you for the referral and allowing [place] to share in the care of your patient.   Clint Guy, MS,  [facility] Genetic Counselor [email or web address] Phone: [phone]  The patient was seen for a total of 40 minutes in face-to-face genetic counseling.  This patient was discussed with Drs. Magrinat, Lindi Adie and/or Burr Medico who agrees with the above.    _______________________________________________________________________ For Office Staff:  Number of people involved in session: 1 Was an Intern/ student involved with case: no

## 2020-02-22 ENCOUNTER — Ambulatory Visit (INDEPENDENT_AMBULATORY_CARE_PROVIDER_SITE_OTHER): Payer: BC Managed Care – PPO

## 2020-02-22 DIAGNOSIS — J309 Allergic rhinitis, unspecified: Secondary | ICD-10-CM

## 2020-02-22 NOTE — Progress Notes (Signed)
Ms. Biehler will be providing a saliva sample rather than having her blood drawn for genetic testing. She will receive the saliva collection kit and will be responsible for mailing her sample to the laboratory. Once the laboratory receives her sample, we expect to have results within two-three weeks.

## 2020-02-27 ENCOUNTER — Ambulatory Visit (INDEPENDENT_AMBULATORY_CARE_PROVIDER_SITE_OTHER): Payer: BC Managed Care – PPO

## 2020-02-27 DIAGNOSIS — J309 Allergic rhinitis, unspecified: Secondary | ICD-10-CM | POA: Diagnosis not present

## 2020-03-05 ENCOUNTER — Encounter: Payer: Self-pay | Admitting: Genetic Counselor

## 2020-03-05 ENCOUNTER — Telehealth: Payer: Self-pay | Admitting: Genetic Counselor

## 2020-03-05 ENCOUNTER — Ambulatory Visit: Payer: Self-pay | Admitting: Genetic Counselor

## 2020-03-05 DIAGNOSIS — Z1379 Encounter for other screening for genetic and chromosomal anomalies: Secondary | ICD-10-CM | POA: Insufficient documentation

## 2020-03-05 NOTE — Progress Notes (Signed)
HPI:  Jacqueline Holmes was previously seen in the [facility] clinic due to a family history of cancer and concerns regarding a hereditary predisposition to cancer. Please refer to our prior cancer genetics clinic note for more information regarding our discussion, assessment and recommendations, at the time. Jacqueline Holmes recent genetic test results were disclosed to her, as were recommendations warranted by these results. These results and recommendations are discussed in more detail below.  FAMILY HISTORY:  We obtained a detailed, 4-generation family history.  Significant diagnoses are listed below: Family History  Problem Relation Age of Onset  . Prostate cancer Father        dx. in his 27s  . Allergic rhinitis Mother   . Asthma Mother   . Alpha-1 antitrypsin deficiency Mother   . COPD Mother   . Diabetes Maternal Grandmother   . Pancreatic cancer Maternal Grandmother        dx. in her early 39s  . Heart disease Maternal Grandfather        CABG in 60's  . Diabetes Maternal Grandfather   . Diabetes Paternal Grandmother   . Diabetes Paternal Grandfather   . Heart disease Paternal Grandfather   . Prostate cancer Paternal Uncle        dx. in his 33s  . Angioedema Neg Hx   . Eczema Neg Hx    Jacqueline Holmes has one son who is 5 and has not had cancer. She has a brother who is 39 and also has not had cancer.  Jacqueline Holmes mother is 63 years old and does not have a history of cancer. She notes that her mother does have a genetic lung disease (possibly alpha-1 antitrypsin deficiency) which was confirmed with genetic testing. Jacqueline Holmes has two maternal aunts and one maternal uncle. Her uncle died in his early-30s and did not have cancer. Her maternal aunts also have not had cancer. Her maternal grandmother died in her early 36s from pancreatic cancer. Jacqueline Holmes notes that her grandmother chewed tobacco, but did not smoke. Her maternal grandfather died in his mid-69s and did not have  cancer.  Jacqueline Holmes father is 58 years old and has a history of prostate cancer diagnosed in his early- to [place], which was treated with surgery. She has four paternal uncles. One of her uncles had prostate cancer diagnosed in his 37s which was treated with chemotherapy. Her other uncles are in their early-60s to late-70s. One of her paternal uncles had a granddaughter who had leukemia diagnosed when she was 26 or 50 years old. Jacqueline Holmes paternal grandmother died at age 34, and her paternal grandfather died at age 53.  Jacqueline Holmes is unaware of previous family history of genetic testing for hereditary cancer risks. Her maternal ancestors are of [place] descent, and paternal ancestors are of [place] descent. There is no reported Ashkenazi Jewish ancestry. There is no known consanguinity.  GENETIC TEST RESULTS: Genetic testing reported out on 03/04/2020 through the [facility] Multi-Cancer panel. No pathogenic variants were detected.   The Multi-Cancer Panel offered by [facility] includes sequencing and/or deletion duplication testing of the following 85 genes: AIP, ALK, APC, ATM, AXIN2,BAP1,  BARD1, BLM, BMPR1A, BRCA1, BRCA2, BRIP1, CASR, CDC73, CDH1, CDK4, CDKN1B, CDKN1C, CDKN2A (p14ARF), CDKN2A (p16INK4a), CEBPA, CHEK2, CTNNA1, DICER1, DIS3L2, EGFR (c.2369C>T, p.Thr790Met variant only), EPCAM (Deletion/duplication testing only), FH, FLCN, GATA2, GPC3, GREM1 (Promoter region deletion/duplication testing only), HOXB13 (c.251G>A, p.Gly84Glu), HRAS, KIT, MAX, MEN1, MET, MITF (c.952G>A, p.Glu318Lys variant only), MLH1, MSH2, MSH3,  MSH6, MUTYH, NBN, NF1, NF2, NTHL1, PALB2, PDGFRA, PHOX2B, PMS2, POLD1, POLE, POT1, PRKAR1A, PTCH1, PTEN, RAD50, RAD51C, RAD51D, RB1, RECQL4, RET, RNF43, RUNX1, SDHAF2, SDHA (sequence changes only), SDHB, SDHC, SDHD, SMAD4, SMARCA4, SMARCB1, SMARCE1, STK11, SUFU, TERC, TERT, TMEM127, TP53, TSC1, TSC2, VHL, WRN and WT1. The test report will be scanned into EPIC and located under the Molecular  Pathology section of the Results Review tab.  A portion of the result report is included below for reference.     We discussed with Jacqueline Holmes that because current genetic testing is not perfect, it is possible there may be a gene mutation in one of these genes that current testing cannot detect, but that chance is small.  We also discussed, that there could be another gene that has not yet been discovered, or that we have not yet tested, that is responsible for the cancer diagnoses in the family. It is also possible there is a hereditary cause for the cancer in the family that Jacqueline Holmes did not inherit and therefore was not identified in her testing.  Therefore, it is important to remain in touch with cancer genetics in the future so that we can continue to offer Jacqueline Holmes the most up to date genetic testing.   Genetic testing did identify a variant of uncertain significance (VUS) in the [facility] gene called c.119G>A.  At this time, it is unknown if this variant is associated with increased cancer risk or if this is a normal finding, but most variants such as this get reclassified to being inconsequential. It should not be used to make medical management decisions. With time, we suspect the lab will determine the significance of this variant, if any. If we do learn more about it, we will try to contact Jacqueline Holmes to discuss it further. However, it is important to stay in touch with [place] periodically and keep the address and phone number up to date.  CANCER SCREENING RECOMMENDATIONS: Jacqueline Holmes test result is considered negative (normal).  This means that we have not identified a hereditary cause for her family history of cancer at this time. Most cancers happen by chance and this negative test suggests that her family history of cancer may fall into this category.    While reassuring, this does not definitively rule out a hereditary predisposition to cancer. It is still possible that there could be genetic  mutations that are undetectable by current technology. There could be genetic mutations in genes that have not been tested or identified to increase cancer risk.  Therefore, it is recommended she continue to follow the cancer management and screening guidelines provided by her primary healthcare provider.   An individual's cancer risk and medical management are not determined by genetic test results alone. Overall cancer risk assessment incorporates additional factors, including personal medical history, family history, and any available genetic information that may result in a personalized plan for cancer prevention and surveillance.  Based on Jacqueline Holmes's personal and family history, as well as her genetic test results, a statistical model Midwife) was used to estimate her risk of developing breast cancer. Tyrer-Cuzick estimates her lifetime risk of developing breast cancer to be approximately 7.0%.  This lifetime breast cancer risk is a preliminary estimate based on available information using one of several models endorsed by the [place] (ACS). The ACS recommends consideration of breast MRI screening as an adjunct to mammography for patients at high risk (defined as 20% or greater lifetime risk). A more detailed  breast cancer risk assessment can be considered, if clinically indicated.      RECOMMENDATIONS FOR FAMILY MEMBERS:  Individuals in this family might be at some increased risk of developing cancer, over the general population risk, simply due to the family history of cancer.  We recommended women in this family have a yearly mammogram beginning at age 50, or 12 years younger than the earliest onset of cancer, an annual clinical breast exam, and perform monthly breast self-exams. Women in this family should also have a gynecological exam as recommended by their primary provider. All family members should have a colonoscopy by age 52.  It is also possible there is a  hereditary cause for the cancer in Jacqueline Holmes's family that she did not inherit and therefore was not identified in her.  Based on Jacqueline Holmes's family history, we recommended first-degree relatives of her maternal grandmother, who died from pancreatic cancer in her early 47s, have genetic counseling and testing. Ms. Moritz will let us know if we can be of any assistance in coordinating genetic counseling and/or testing for this family member.   FOLLOW-UP: Lastly, we discussed with Ms. Monsour that cancer genetics is a rapidly advancing field and it is possible that new genetic tests will be appropriate for her and/or her family members in the future. We encouraged her to remain in contact with cancer genetics on an annual basis so we can update her personal and family histories and let her know of advances in cancer genetics that may benefit this family.   Our contact number was provided. Ms. Buda questions were answered to her satisfaction, and she knows she is welcome to call us at anytime with additional questions or concerns.   Clint Guy, MS, [facility] Genetic Counselor [email or web address] Phone: [phone]

## 2020-03-05 NOTE — Telephone Encounter (Signed)
Revealed negative genetic testing.  Discussed that we do not know why there is cancer in the family.  There could be a genetic mutation in the family that Jacqueline Holmes did not inherit.  There could also be a mutation in a different gene that we are not testing, or our current technology may not be able detect certain mutations.  It will therefore be a good idea for her to keep in contact with genetics to keep up with whether additional testing may be appropriate in the future.   A variant of uncertain significance was detected in the [facility] gene called c.119G>A. Her result is still considered normal at this time and should not impact her medical management.

## 2020-03-05 NOTE — Telephone Encounter (Signed)
LVM that her genetic test results are available and requested that she call back to discuss them.  

## 2020-03-06 ENCOUNTER — Ambulatory Visit (INDEPENDENT_AMBULATORY_CARE_PROVIDER_SITE_OTHER): Payer: BC Managed Care – PPO | Admitting: *Deleted

## 2020-03-06 DIAGNOSIS — J309 Allergic rhinitis, unspecified: Secondary | ICD-10-CM | POA: Diagnosis not present

## 2020-03-06 NOTE — H&P (View-Only) (Signed)
Gynecologic Oncology Telehealth Consult Note: Gyn-Onc  I connected with Jacqueline Holmes on 03/07/20 at  3:30 PM EDT by telephone and verified that I am speaking with the correct person using two identifiers.  I discussed the limitations, risks, security and privacy concerns of performing an evaluation and management service by telemedicine and the availability of in-person appointments. I also discussed with the patient that there may be a patient responsible charge related to this service. The patient expressed understanding and agreed to proceed.  Other persons participating in the visit and their role in the encounter: None.  Patient's location: Home Provider's location: [facility]  Reason for Visit: Follow-up bilateral adnexal masses, repeat imaging  Treatment History: Presented with postcoital bleeding. 2/25: Seen in clinic and underwent endometrial biopsy.  Pathology from this revealed scant atrophic- appearing endometrium present in the background of a  majority of endocervical glandular and squamous epithelium. No malignancy identified. Pelvic US: Uterus with fibroids (3). Larges it 0.97 cm. Endometrial lining - 3.29 mm. Bilateral ovaries with complex cysts - right ovarian cyst - 3.8 cm with papillations, largest 20 mm, left ovarian cyst - 3.0 cm with papillations, largest 15 mm. No abnormal vascularization of ovaries. No free fluid. Tumor markers: CA-125: 11.9 CEA: 2 Genetic testing: negative for pathogenic mutations.  Interval History: Patient reports overall doing about the same although some symptoms have worsened since her visit with me.  She has had a couple episodes of pain, most recently last Friday, that almost took her into the emergency room.  She notes now that the pain is more superior on her abdomen and stretches across much of her belly.  She also endorses intermittent low back ache now.  Her appetite has remained stable and she continues to have  intermittent nausea which has not changed.  Her bowel function has become a little bit more regular.  Her abdominal bloating is the same to slightly increased.  Past Medical/Surgical History: Past Medical History:  Diagnosis Date  . Angio-edema   . Asthma   . Cough   . Diabetes mellitus arising in pregnancy    now pre-diabetes  . Family history of breast cancer    MGM  . Family history of pancreatic cancer   . Family history of prostate cancer   . Fatigue   . Generalized headaches    migraines on occasion.  Berenice Primas disease   . Hyperlipidemia   . Hypothyroidism    s/p thyroidectomy for Grave's disease (Dr. Tye Savoy at [facility])  . IBS (irritable bowel syndrome)   . Incontinence   . Migraine    without aura  . Night sweats   . Recurrent upper respiratory infection (URI)   . Sinus problem    runny nose  . SOB (shortness of breath)   . Urticaria     Past Surgical History:  Procedure Laterality Date  . ABDOMINOPLASTY  12/2015   Dr. Towanda Malkin  . BREAST REDUCTION SURGERY  11/03/2018  . BREAST SURGERY  10/2018   breast reduction  . CESAREAN SECTION    . DILATATION & CURETTAGE/HYSTEROSCOPY WITH TRUECLEAR N/A 01/16/2014   Procedure: DILATATION & CURETTAGE/HYSTEROSCOPY WITH TRUCLEAR;  Surgeon: Marylynn Pearson, MD;  Location: [facility];  Service: Gynecology;  Laterality: N/A;  . LASIK    . prk laser eye surgery Left 08/2019  . TOTAL THYROIDECTOMY  07/30/11   Dr. Harlow Asa (Grave's disease)  . VULVA /PERINEUM BIOPSY N/A 01/16/2014   Procedure: VULVAR BIOPSY;  Surgeon: Marylynn Pearson,  MD;  Location: [facility];  Service: Gynecology;  Laterality: N/A;    Family History  Problem Relation Age of Onset  . Prostate cancer Father        dx. in his 25s  . Allergic rhinitis Mother   . Asthma Mother   . Alpha-1 antitrypsin deficiency Mother   . COPD Mother   . Diabetes Maternal Grandmother   . Pancreatic cancer Maternal Grandmother        dx. in her early 93s  . Heart disease  Maternal Grandfather        CABG in 60's  . Diabetes Maternal Grandfather   . Diabetes Paternal Grandmother   . Diabetes Paternal Grandfather   . Heart disease Paternal Grandfather   . Prostate cancer Paternal Uncle        dx. in his 87s  . Angioedema Neg Hx   . Eczema Neg Hx     Social History   Socioeconomic History  . Marital status: Married    Spouse name: Not on file  . Number of children: 1  . Years of education: Not on file  . Highest education level: Not on file  Occupational History  . 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 Smoking status: Never Smoker  . Smokeless tobacco: Never Used  Substance and Sexual Activity  . Alcohol use: Yes    Comment: once a month  . Drug use: No  . Sexual activity: Yes    Partners: Male    Birth control/protection: None    Comment: postmenopausal (LMP3/2018)  Other Topics Concern  . Not on file  Social History Narrative   Divorced and re-married.  Lives with her husband.  Son is grown and lives in own apartment   Social Determinants of Health   Financial Resource Strain:   . Difficulty of Paying Living Expenses:   Food Insecurity:   . Worried About Charity fundraiser in the Last Year:   . Arboriculturist in the Last Year:   Transportation Needs:   . Film/video editor (Medical):   Marland Kitchen Lack of Transportation (Non-Medical):   Physical Activity:   . Days of Exercise per Week:   . Minutes of Exercise per Session:   Stress:   . Feeling of Stress :   Social Connections:   . Frequency of Communication with Friends and Family:   . Frequency of Social Gatherings with Friends and Family:   . Attends Religious Services:   . Active Member of Clubs or Organizations:   . Attends Archivist Meetings:   Marland Kitchen Marital Status:     Current Medications:  Current Outpatient Medications:  .  acetaminophen (TYLENOL) 500 MG tablet, Take 1,000  mg by mouth every 6 (six) hours as needed for headache. Reported on 06/17/2016, Disp: , Rfl:  .  ASHWAGANDHA PO, Take 300 mg by mouth 2 (two) times daily., Disp: , Rfl:  .  Barberry-Oreg Grape-Goldenseal (BERBERINE COMPLEX PO), Take by mouth 2 (two) times daily., Disp: , Rfl:  .  budesonide-formoterol (SYMBICORT) 160-4.5 MCG/ACT inhaler, Two puffs with spacer device twice a day during asthma flares.  Rinse, gargle and spit after use., Disp: 1 Inhaler, Rfl: 5 .  cholecalciferol (VITAMIN D) 1000 UNITS tablet, Take 1,000 Units by mouth daily. , Disp: , Rfl:  .  estradiol (ESTRACE) 1 MG tablet, Take 1 tablet by mouth daily., Disp: , Rfl:  .  Fexofenadine HCl (ALLEGRA PO), Take by mouth., Disp: , Rfl:  .  guaiFENesin (MUCINEX) 600 MG 12 hr tablet, Take 600 mg by mouth 2 (two) times daily., Disp: , Rfl:  .  hydrOXYzine (ATARAX/VISTARIL) 25 MG tablet, as needed. , Disp: , Rfl:  .  ibuprofen (ADVIL,MOTRIN) 200 MG tablet, Take 600-800 mg by mouth every 6 (six) hours as needed., Disp: , Rfl:  .  ipratropium (ATROVENT) 0.03 % nasal spray, 2 sprays per nostril every 8 hours if needed, Disp: 30 mL, Rfl: 5 .  levocetirizine (XYZAL) 5 MG tablet, TAKE 1 TABLET ONCE DAILY IN THE EVENING., Disp: 90 tablet, Rfl: 1 .  levothyroxine (SYNTHROID) 75 MCG tablet, Take 75 mcg by mouth daily., Disp: , Rfl:  .  metFORMIN (GLUCOPHAGE) 1000 MG tablet, Take 1,000 mg by mouth daily., Disp: , Rfl:  .  NON FORMULARY, 306 mg daily. Estrogen Control, Disp: , Rfl:  .  Pregnenolone POWD, 200 mg by Does not apply route daily., Disp: , Rfl:  .  PROAIR HFA 108 (90 Base) MCG/ACT inhaler, Inhale 2 puffs into the lungs every 4 (four) hours as needed., Disp: 8.5 g, Rfl: 1 .  progesterone (PROMETRIUM) 200 MG capsule, , Disp: , Rfl:  .  QVAR REDIHALER 80 MCG/ACT inhaler, INHALE 2 PUFFS TWICE DAILY, Disp: 10.6 g, Rfl: 0 .  Spacer/Aero-Holding Chambers [facility] DIAMOND) DEVI, Use as directed with inhaler. Dx:  J45.40 Asthma, Disp: 1 Device,  Rfl: 2 .  thyroid (NP THYROID) 90 MG tablet, Take 1 tablet by mouth daily., Disp: , Rfl:   Review of Symptoms: Complete 10-system review is negative except as above in Interval History.  Physical Exam: LMP 01/28/2017  Not performed given limitations of phone visit.  Laboratory & Radiologic Studies: Genetic testing reported out on 03/04/2020 through the [facility] Multi-Cancer panel. No pathogenic variants were detected  Pelvic ultrasound on 4/8: Uterus Measurements: 8.0 x 3.5 x 4.0 cm = volume: 59 mL. Anteverted. Two small uterine leiomyomata are identified including a small intramural leiomyoma at the anterior mid uterus 8 x 6 x 9 mm and a small subserosal leiomyoma at fundus 11 x 9 x 10 mm. Heterogeneous myometrium. Nabothian cysts at cervix. Endometrium Thickness: 4 mm.  Trace endometrial fluid. Right ovary Measurements: 4.9 x 3.2 x 4.0 cm = volume: 31.9 mL. Numerous follicles up to 2.1 cm diameter. The largest follicle measures 2.1 x 2.0 x 1.6 cm and contains an 8 x 9 x 8 mm area of mural nodularity, lesion overall minimally increased in size. Left ovary Measurements: 3.1 x 2.3 x 2.6 cm = volume: 9.9 mL. Multiple follicles. The dominant follicle within the LEFT ovary at measures 1.4 x 1.3 x 1.3 cm and contains a small 8 x 6 x 6 mm area of mural nodularity. This lesion has decreased in size and complexity since the prior study. Other findings Trace free pelvic fluid.  No other adnexal masses. IMPRESSION: Two small uterine leiomyomata. Multiple follicles within both ovaries, several of which are complex in appearance and contain small mural nodules, grossly stable on LEFT and dominant follicle on RIGHT minimally larger.  Assessment & Plan: Jacqueline Holmes is a 49 y.o. postmenopausal woman with bilateral complex adnexal masses.  Reviewed ultrasound results from yesterday.  Bilateral ovaries continue to have dominant follicle versus cyst with small area of mural nodularity in each.  Overall,  both ovaries appear stable in size and character.  Given the patient's persistent and in some cases worsening symptoms, I discussed proceeding  with CT of the abdomen and pelvis to look for other possible etiologies of her abdominal pain and to assess for any evidence of metastatic disease.  We will talk by phone once the CT scan has been performed and plan to proceed based on these results.  Ultimately, we may proceed with diagnostic laparoscopy and possible removal of an ovary to send for frozen section if any concern persists.  I discussed the assessment and treatment plan with the patient. The patient was provided with an opportunity to ask questions and all were answered. The patient agreed with the plan and demonstrated an understanding of the instructions.   The patient was advised to call back or see an in-person evaluation if the symptoms worsen or if the condition fails to improve as anticipated.   15 minutes of total time was spent for this patient encounter, including preparation, face-to-face counseling with the patient and coordination of care, and documentation of the encounter.   Jeral Pinch, MD  Division of Gynecologic Oncology  Department of Obstetrics and Gynecology  [facility]

## 2020-03-06 NOTE — Progress Notes (Signed)
Gynecologic Oncology Telehealth Consult Note: Gyn-Onc  I connected with Jacqueline Holmes on 03/07/20 at  3:30 PM EDT by telephone and verified that I am speaking with the correct person using two identifiers.  I discussed the limitations, risks, security and privacy concerns of performing an evaluation and management service by telemedicine and the availability of in-person appointments. I also discussed with the patient that there may be a patient responsible charge related to this service. The patient expressed understanding and agreed to proceed.  Other persons participating in the visit and their role in the encounter: None.  Patient's location: Home Provider's location: [facility]  Reason for Visit: Follow-up bilateral adnexal masses, repeat imaging  Treatment History: Presented with postcoital bleeding. 2/25: Seen in clinic and underwent endometrial biopsy.  Pathology from this revealed scant atrophic- appearing endometrium present in the background of a  majority of endocervical glandular and squamous epithelium. No malignancy identified. Pelvic US: Uterus with fibroids (3). Larges it 0.97 cm. Endometrial lining - 3.29 mm. Bilateral ovaries with complex cysts - right ovarian cyst - 3.8 cm with papillations, largest 20 mm, left ovarian cyst - 3.0 cm with papillations, largest 15 mm. No abnormal vascularization of ovaries. No free fluid. Tumor markers: CA-125: 11.9 CEA: 2 Genetic testing: negative for pathogenic mutations.  Interval History: Patient reports overall doing about the same although some symptoms have worsened since her visit with me.  She has had a couple episodes of pain, most recently last Friday, that almost took her into the emergency room.  She notes now that the pain is more superior on her abdomen and stretches across much of her belly.  She also endorses intermittent low back ache now.  Her appetite has remained stable and she continues to have  intermittent nausea which has not changed.  Her bowel function has become a little bit more regular.  Her abdominal bloating is the same to slightly increased.  Past Medical/Surgical History: Past Medical History:  Diagnosis Date  . Angio-edema   . Asthma   . Cough   . Diabetes mellitus arising in pregnancy    now pre-diabetes  . Family history of breast cancer    MGM  . Family history of pancreatic cancer   . Family history of prostate cancer   . Fatigue   . Generalized headaches    migraines on occasion.  Berenice Primas disease   . Hyperlipidemia   . Hypothyroidism    s/p thyroidectomy for Grave's disease (Dr. Tye Savoy at [facility])  . IBS (irritable bowel syndrome)   . Incontinence   . Migraine    without aura  . Night sweats   . Recurrent upper respiratory infection (URI)   . Sinus problem    runny nose  . SOB (shortness of breath)   . Urticaria     Past Surgical History:  Procedure Laterality Date  . ABDOMINOPLASTY  12/2015   Dr. Towanda Malkin  . BREAST REDUCTION SURGERY  11/03/2018  . BREAST SURGERY  10/2018   breast reduction  . CESAREAN SECTION    . DILATATION & CURETTAGE/HYSTEROSCOPY WITH TRUECLEAR N/A 01/16/2014   Procedure: DILATATION & CURETTAGE/HYSTEROSCOPY WITH TRUCLEAR;  Surgeon: Marylynn Pearson, MD;  Location: [facility];  Service: Gynecology;  Laterality: N/A;  . LASIK    . prk laser eye surgery Left 08/2019  . TOTAL THYROIDECTOMY  07/30/11   Dr. Harlow Asa (Grave's disease)  . VULVA /PERINEUM BIOPSY N/A 01/16/2014   Procedure: VULVAR BIOPSY;  Surgeon: Marylynn Pearson,  MD;  Location: [facility];  Service: Gynecology;  Laterality: N/A;    Family History  Problem Relation Age of Onset  . Prostate cancer Father        dx. in his 66s  . Allergic rhinitis Mother   . Asthma Mother   . Alpha-1 antitrypsin deficiency Mother   . COPD Mother   . Diabetes Maternal Grandmother   . Pancreatic cancer Maternal Grandmother        dx. in her early 26s  . Heart disease  Maternal Grandfather        CABG in 60's  . Diabetes Maternal Grandfather   . Diabetes Paternal Grandmother   . Diabetes Paternal Grandfather   . Heart disease Paternal Grandfather   . Prostate cancer Paternal Uncle        dx. in his 63s  . Angioedema Neg Hx   . Eczema Neg Hx     Social History   Socioeconomic History  . Marital status: Married    Spouse name: Not on file  . Number of children: 1  . Years of education: Not on file  . Highest education level: Not on file  Occupational History  . 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 Smoking status: Never Smoker  . Smokeless tobacco: Never Used  Substance and Sexual Activity  . Alcohol use: Yes    Comment: once a month  . Drug use: No  . Sexual activity: Yes    Partners: Male    Birth control/protection: None    Comment: postmenopausal (LMP3/2018)  Other Topics Concern  . Not on file  Social History Narrative   Divorced and re-married.  Lives with her husband.  Son is grown and lives in own apartment   Social Determinants of Health   Financial Resource Strain:   . Difficulty of Paying Living Expenses:   Food Insecurity:   . Worried About Charity fundraiser in the Last Year:   . Arboriculturist in the Last Year:   Transportation Needs:   . Film/video editor (Medical):   Marland Kitchen Lack of Transportation (Non-Medical):   Physical Activity:   . Days of Exercise per Week:   . Minutes of Exercise per Session:   Stress:   . Feeling of Stress :   Social Connections:   . Frequency of Communication with Friends and Family:   . Frequency of Social Gatherings with Friends and Family:   . Attends Religious Services:   . Active Member of Clubs or Organizations:   . Attends Archivist Meetings:   Marland Kitchen Marital Status:     Current Medications:  Current Outpatient Medications:  .  acetaminophen (TYLENOL) 500 MG tablet, Take 1,000  mg by mouth every 6 (six) hours as needed for headache. Reported on 06/17/2016, Disp: , Rfl:  .  ASHWAGANDHA PO, Take 300 mg by mouth 2 (two) times daily., Disp: , Rfl:  .  Barberry-Oreg Grape-Goldenseal (BERBERINE COMPLEX PO), Take by mouth 2 (two) times daily., Disp: , Rfl:  .  budesonide-formoterol (SYMBICORT) 160-4.5 MCG/ACT inhaler, Two puffs with spacer device twice a day during asthma flares.  Rinse, gargle and spit after use., Disp: 1 Inhaler, Rfl: 5 .  cholecalciferol (VITAMIN D) 1000 UNITS tablet, Take 1,000 Units by mouth daily. , Disp: , Rfl:  .  estradiol (ESTRACE) 1 MG tablet, Take 1 tablet by mouth daily., Disp: , Rfl:  .  Fexofenadine HCl (ALLEGRA PO), Take by mouth., Disp: , Rfl:  .  guaiFENesin (MUCINEX) 600 MG 12 hr tablet, Take 600 mg by mouth 2 (two) times daily., Disp: , Rfl:  .  hydrOXYzine (ATARAX/VISTARIL) 25 MG tablet, as needed. , Disp: , Rfl:  .  ibuprofen (ADVIL,MOTRIN) 200 MG tablet, Take 600-800 mg by mouth every 6 (six) hours as needed., Disp: , Rfl:  .  ipratropium (ATROVENT) 0.03 % nasal spray, 2 sprays per nostril every 8 hours if needed, Disp: 30 mL, Rfl: 5 .  levocetirizine (XYZAL) 5 MG tablet, TAKE 1 TABLET ONCE DAILY IN THE EVENING., Disp: 90 tablet, Rfl: 1 .  levothyroxine (SYNTHROID) 75 MCG tablet, Take 75 mcg by mouth daily., Disp: , Rfl:  .  metFORMIN (GLUCOPHAGE) 1000 MG tablet, Take 1,000 mg by mouth daily., Disp: , Rfl:  .  NON FORMULARY, 306 mg daily. Estrogen Control, Disp: , Rfl:  .  Pregnenolone POWD, 200 mg by Does not apply route daily., Disp: , Rfl:  .  PROAIR HFA 108 (90 Base) MCG/ACT inhaler, Inhale 2 puffs into the lungs every 4 (four) hours as needed., Disp: 8.5 g, Rfl: 1 .  progesterone (PROMETRIUM) 200 MG capsule, , Disp: , Rfl:  .  QVAR REDIHALER 80 MCG/ACT inhaler, INHALE 2 PUFFS TWICE DAILY, Disp: 10.6 g, Rfl: 0 .  Spacer/Aero-Holding Chambers [facility] DIAMOND) DEVI, Use as directed with inhaler. Dx:  J45.40 Asthma, Disp: 1 Device,  Rfl: 2 .  thyroid (NP THYROID) 90 MG tablet, Take 1 tablet by mouth daily., Disp: , Rfl:   Review of Symptoms: Complete 10-system review is negative except as above in Interval History.  Physical Exam: LMP 01/28/2017  Not performed given limitations of phone visit.  Laboratory & Radiologic Studies: Genetic testing reported out on 03/04/2020 through the [facility] Multi-Cancer panel. No pathogenic variants were detected  Pelvic ultrasound on 4/8: Uterus Measurements: 8.0 x 3.5 x 4.0 cm = volume: 59 mL. Anteverted. Two small uterine leiomyomata are identified including a small intramural leiomyoma at the anterior mid uterus 8 x 6 x 9 mm and a small subserosal leiomyoma at fundus 11 x 9 x 10 mm. Heterogeneous myometrium. Nabothian cysts at cervix. Endometrium Thickness: 4 mm.  Trace endometrial fluid. Right ovary Measurements: 4.9 x 3.2 x 4.0 cm = volume: 31.9 mL. Numerous follicles up to 2.1 cm diameter. The largest follicle measures 2.1 x 2.0 x 1.6 cm and contains an 8 x 9 x 8 mm area of mural nodularity, lesion overall minimally increased in size. Left ovary Measurements: 3.1 x 2.3 x 2.6 cm = volume: 9.9 mL. Multiple follicles. The dominant follicle within the LEFT ovary at measures 1.4 x 1.3 x 1.3 cm and contains a small 8 x 6 x 6 mm area of mural nodularity. This lesion has decreased in size and complexity since the prior study. Other findings Trace free pelvic fluid.  No other adnexal masses. IMPRESSION: Two small uterine leiomyomata. Multiple follicles within both ovaries, several of which are complex in appearance and contain small mural nodules, grossly stable on LEFT and dominant follicle on RIGHT minimally larger.  Assessment & Plan: Jacqueline Holmes is a 49 y.o. postmenopausal woman with bilateral complex adnexal masses.  Reviewed ultrasound results from yesterday.  Bilateral ovaries continue to have dominant follicle versus cyst with small area of mural nodularity in each.  Overall,  both ovaries appear stable in size and character.  Given the patient's persistent and in some cases worsening symptoms, I discussed proceeding  with CT of the abdomen and pelvis to look for other possible etiologies of her abdominal pain and to assess for any evidence of metastatic disease.  We will talk by phone once the CT scan has been performed and plan to proceed based on these results.  Ultimately, we may proceed with diagnostic laparoscopy and possible removal of an ovary to send for frozen section if any concern persists.  I discussed the assessment and treatment plan with the patient. The patient was provided with an opportunity to ask questions and all were answered. The patient agreed with the plan and demonstrated an understanding of the instructions.   The patient was advised to call back or see an in-person evaluation if the symptoms worsen or if the condition fails to improve as anticipated.   15 minutes of total time was spent for this patient encounter, including preparation, face-to-face counseling with the patient and coordination of care, and documentation of the encounter.   Jeral Pinch, MD  Division of Gynecologic Oncology  Department of Obstetrics and Gynecology  [facility]

## 2020-03-07 ENCOUNTER — Ambulatory Visit (HOSPITAL_COMMUNITY)
Admission: RE | Admit: 2020-03-07 | Discharge: 2020-03-07 | Disposition: A | Discharge status: Home / Self Care | Payer: BC Managed Care – PPO | Source: Ambulatory Visit | Attending: Gynecologic Oncology | Admitting: Gynecologic Oncology

## 2020-03-07 ENCOUNTER — Other Ambulatory Visit: Payer: Self-pay

## 2020-03-07 DIAGNOSIS — N83202 Unspecified ovarian cyst, left side: Secondary | ICD-10-CM | POA: Diagnosis not present

## 2020-03-07 DIAGNOSIS — N83201 Unspecified ovarian cyst, right side: Secondary | ICD-10-CM | POA: Insufficient documentation

## 2020-03-07 DIAGNOSIS — D259 Leiomyoma of uterus, unspecified: Secondary | ICD-10-CM | POA: Diagnosis not present

## 2020-03-07 IMAGING — US US PELVIS COMPLETE WITH TRANSVAGINAL
1 series · 13 of 25 positions shown · non-contrast
Comparison: [DATE]

CLINICAL DATA: BILATERAL ovarian cysts, follow up, pelvic pain more
on LEFT



[Series 1: us pelvis complete with transvaginal · 121 acquisitions, 13 frames shown]
[im 1/121]
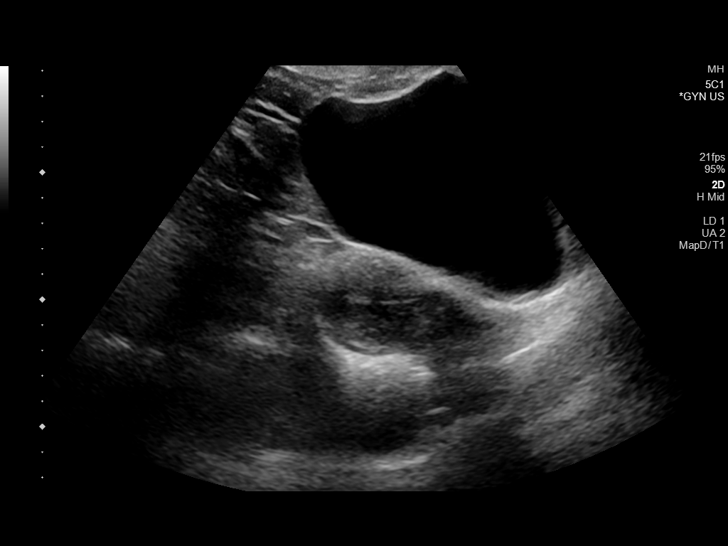
[im 11/121]
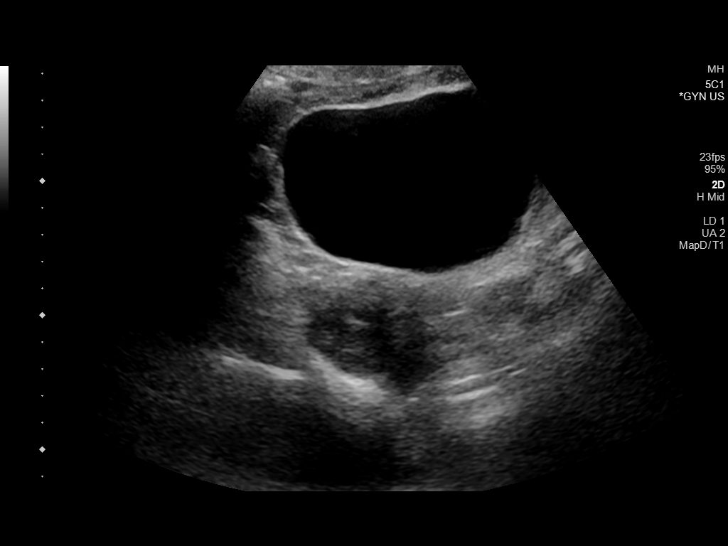
[im 21/121]
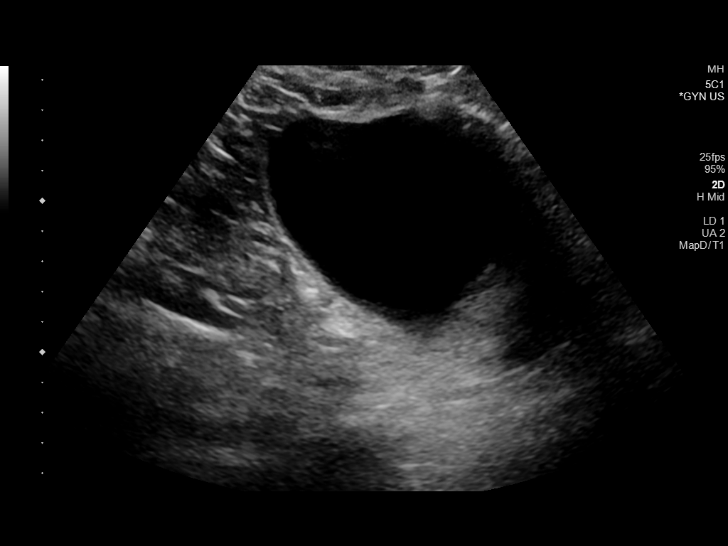
[im 31/121]
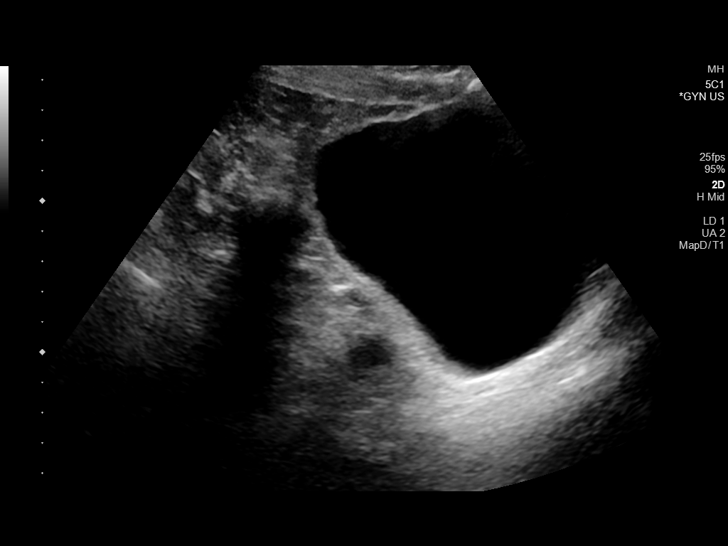
[im 41/121]
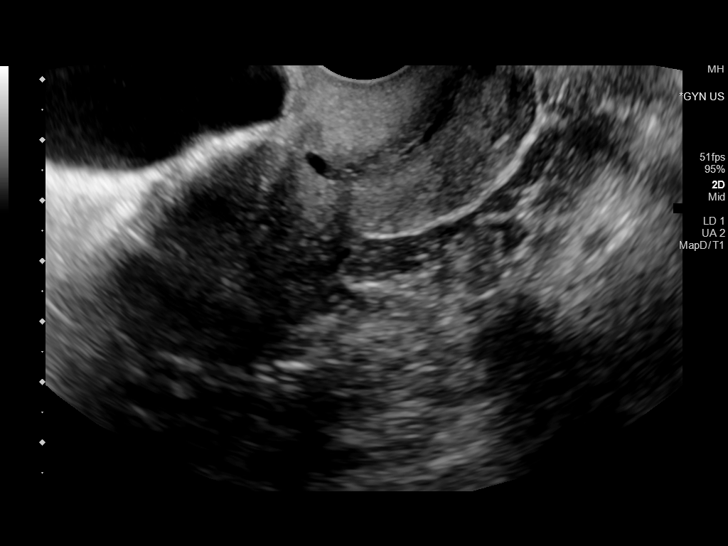
[im 51/121]
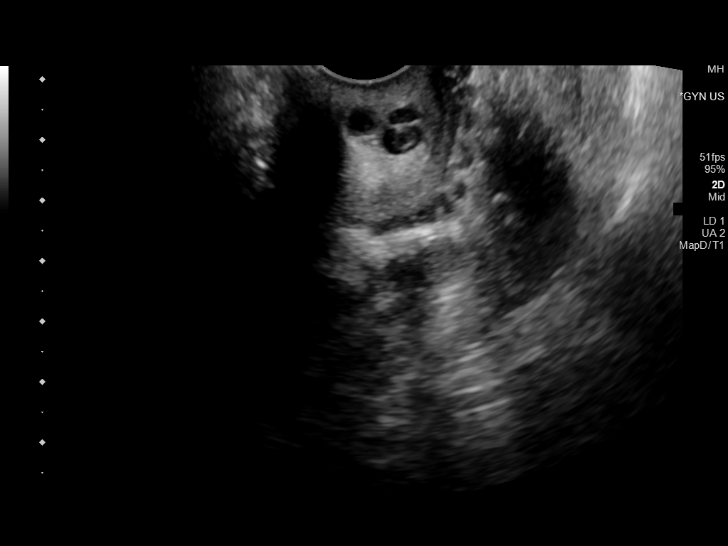
[im 61/121]
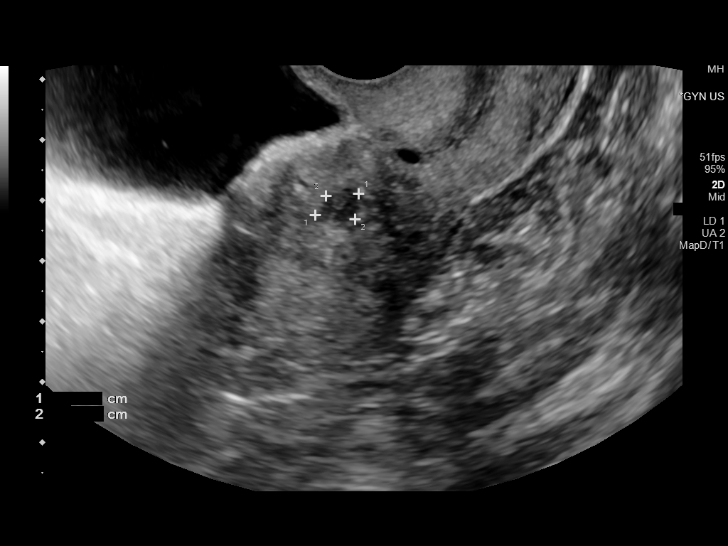
[im 71/121]
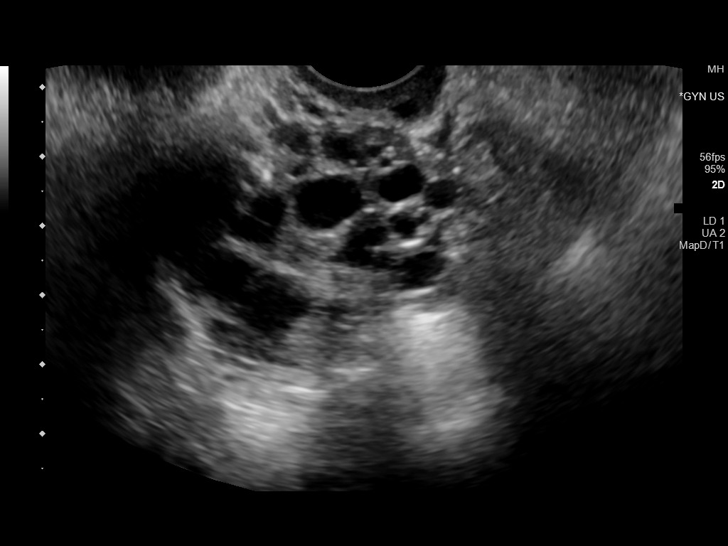
[im 81/121]
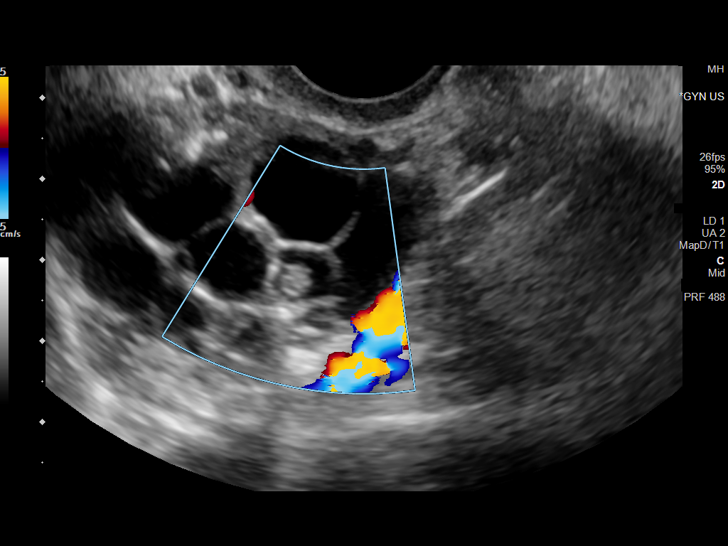
[im 91/121]
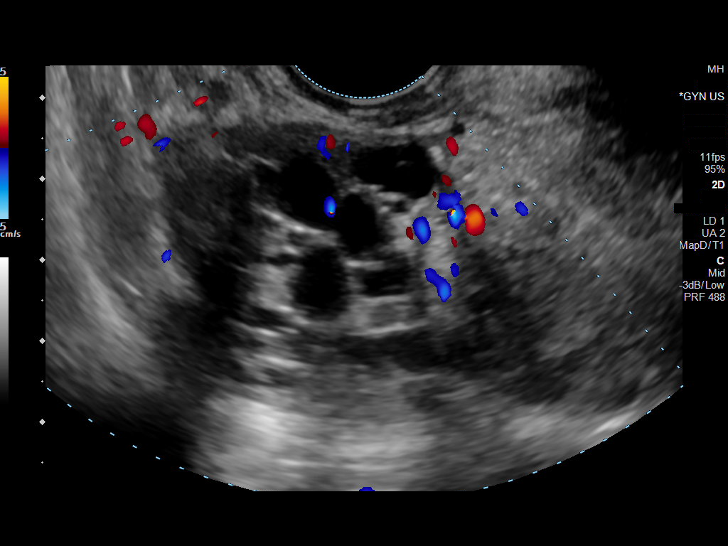
[im 101/121]
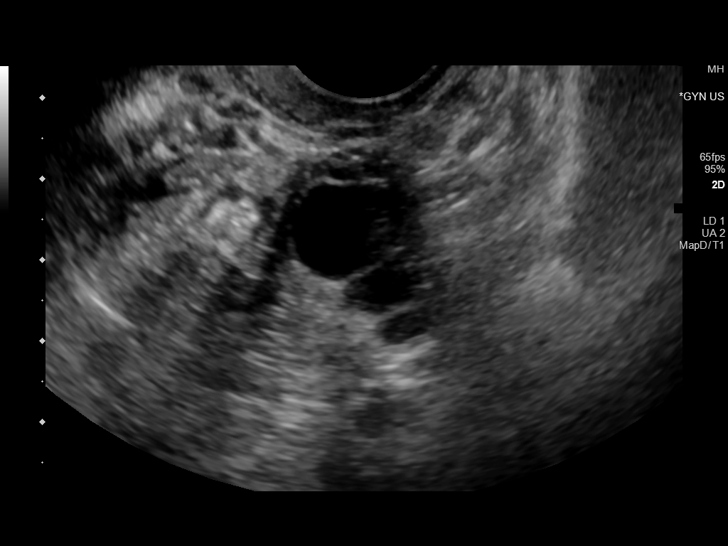
[im 111/121]
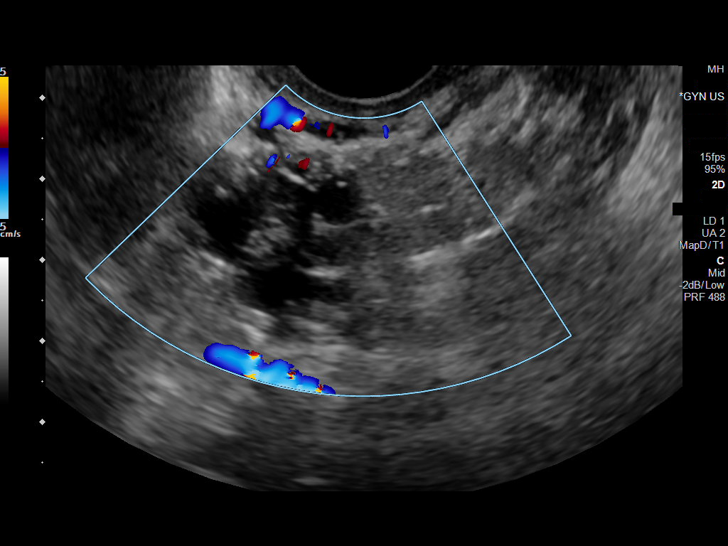
[im 121/121]
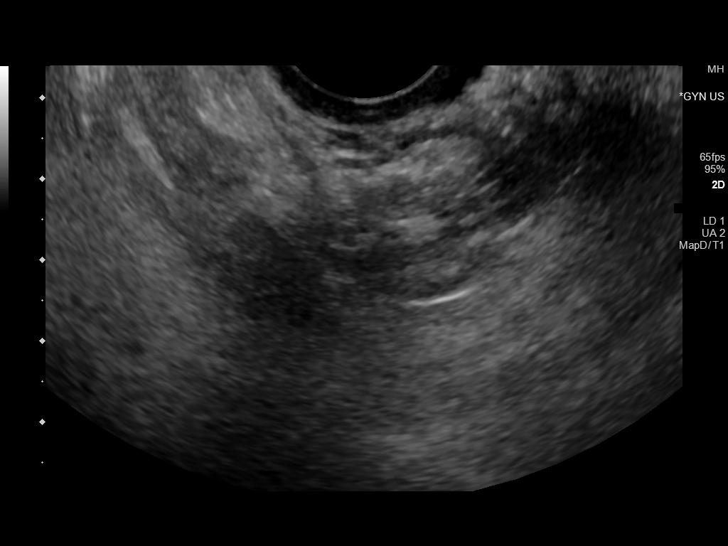

[13 of 25 positions shown; findings below may reference images not displayed]

FINDINGS: Uterus

Measurements: 8.0 x 3.5 x 4.0 cm = volume: 59 mL. Anteverted. Two
small uterine leiomyomata are identified including a small
intramural leiomyoma at the anterior mid uterus 8 x 6 x 9 mm and a
small subserosal leiomyoma at fundus 11 x 9 x 10 mm. Heterogeneous
myometrium. Nabothian cysts at cervix.

Endometrium

Thickness: 4 mm.  Trace endometrial fluid.

Right ovary

Measurements: 4.9 x 3.2 x 4.0 cm = volume: 31.9 mL. Numerous
follicles up to 2.1 cm diameter. The largest follicle measures 2.1 x
2.0 x 1.6 cm and contains an 8 x 9 x 8 mm area of mural nodularity,
lesion overall minimally increased in size.

Left ovary

Measurements: 3.1 x 2.3 x 2.6 cm = volume: 9.9 mL. Multiple
follicles. The dominant follicle within the LEFT ovary at measures
1.4 x 1.3 x 1.3 cm and contains a small 8 x 6 x 6 mm area of mural
nodularity. This lesion has decreased in size and complexity since
the prior study.

Other findings

Trace free pelvic fluid.  No other adnexal masses.
IMPRESSION: Two small uterine leiomyomata.

Multiple follicles within both ovaries, several of which are complex
in appearance and contain small mural nodules, grossly stable on
LEFT and dominant follicle on RIGHT minimally larger.

## 2020-03-08 ENCOUNTER — Inpatient Hospital Stay: Payer: BC Managed Care – PPO | Attending: Gynecologic Oncology | Admitting: Gynecologic Oncology

## 2020-03-08 ENCOUNTER — Telehealth: Payer: Self-pay | Admitting: *Deleted

## 2020-03-08 ENCOUNTER — Encounter: Payer: Self-pay | Admitting: Gynecologic Oncology

## 2020-03-08 DIAGNOSIS — R5381 Other malaise: Secondary | ICD-10-CM | POA: Diagnosis not present

## 2020-03-08 DIAGNOSIS — N83202 Unspecified ovarian cyst, left side: Secondary | ICD-10-CM | POA: Diagnosis not present

## 2020-03-08 DIAGNOSIS — N83201 Unspecified ovarian cyst, right side: Secondary | ICD-10-CM | POA: Diagnosis not present

## 2020-03-08 DIAGNOSIS — R7303 Prediabetes: Secondary | ICD-10-CM | POA: Diagnosis not present

## 2020-03-08 DIAGNOSIS — E039 Hypothyroidism, unspecified: Secondary | ICD-10-CM | POA: Diagnosis not present

## 2020-03-08 DIAGNOSIS — N951 Menopausal and female climacteric states: Secondary | ICD-10-CM | POA: Diagnosis not present

## 2020-03-08 NOTE — Telephone Encounter (Signed)
Called and scheduled the patient for a CT scan. Called the patient and gave the appt date/time. Also gave the patient the instructions with the contrast. Scheduled the patient for lab appt. Patient will pick up contrast at her lab appt

## 2020-03-12 ENCOUNTER — Other Ambulatory Visit: Payer: Self-pay | Admitting: Gynecologic Oncology

## 2020-03-12 DIAGNOSIS — N83202 Unspecified ovarian cyst, left side: Secondary | ICD-10-CM

## 2020-03-12 DIAGNOSIS — N83201 Unspecified ovarian cyst, right side: Secondary | ICD-10-CM

## 2020-03-12 NOTE — Progress Notes (Signed)
BMET 

## 2020-03-13 ENCOUNTER — Inpatient Hospital Stay: Payer: BC Managed Care – PPO

## 2020-03-13 ENCOUNTER — Other Ambulatory Visit: Payer: Self-pay

## 2020-03-13 DIAGNOSIS — E039 Hypothyroidism, unspecified: Secondary | ICD-10-CM | POA: Diagnosis not present

## 2020-03-13 DIAGNOSIS — N83202 Unspecified ovarian cyst, left side: Secondary | ICD-10-CM

## 2020-03-13 DIAGNOSIS — N83201 Unspecified ovarian cyst, right side: Secondary | ICD-10-CM

## 2020-03-13 DIAGNOSIS — R7303 Prediabetes: Secondary | ICD-10-CM | POA: Diagnosis not present

## 2020-03-13 DIAGNOSIS — R5381 Other malaise: Secondary | ICD-10-CM | POA: Diagnosis not present

## 2020-03-13 DIAGNOSIS — R5383 Other fatigue: Secondary | ICD-10-CM | POA: Diagnosis not present

## 2020-03-13 LAB — BASIC METABOLIC PANEL
Anion gap: 8 (ref 5–15)
BUN: 24 mg/dL — ABNORMAL HIGH (ref 6–20)
CO2: 27 mmol/L (ref 22–32)
Calcium: 9.2 mg/dL (ref 8.9–10.3)
Chloride: 105 mmol/L (ref 98–111)
Creatinine, Ser: 0.74 mg/dL (ref 0.44–1.00)
GFR calc Af Amer: >60 mL/min (ref >60)
GFR calc non Af Amer: >60 mL/min (ref >60)
Glucose, Bld: 96 mg/dL (ref 70–99)
Potassium: 4.8 mmol/L (ref 3.5–5.1)
Sodium: 140 mmol/L (ref 135–145)

## 2020-03-14 ENCOUNTER — Ambulatory Visit (HOSPITAL_COMMUNITY)
Admission: RE | Admit: 2020-03-14 | Discharge: 2020-03-14 | Disposition: A | Discharge status: Home / Self Care | Payer: BC Managed Care – PPO | Source: Ambulatory Visit | Attending: Gynecologic Oncology | Admitting: Gynecologic Oncology

## 2020-03-14 ENCOUNTER — Telehealth: Payer: Self-pay | Admitting: Gynecologic Oncology

## 2020-03-14 DIAGNOSIS — N83202 Unspecified ovarian cyst, left side: Secondary | ICD-10-CM | POA: Diagnosis not present

## 2020-03-14 DIAGNOSIS — N83201 Unspecified ovarian cyst, right side: Secondary | ICD-10-CM | POA: Diagnosis not present

## 2020-03-14 DIAGNOSIS — R1030 Lower abdominal pain, unspecified: Secondary | ICD-10-CM | POA: Diagnosis not present

## 2020-03-14 IMAGING — CT CT ABD-PELV W/ CM
2 of 5 series · 16 of 46 positions shown, 18 images · IV contrast (OMNIPAQUE)
Comparison: Ultrasound [DATE]

CLINICAL DATA: Bilateral ovarian cysts/adnexal masses. Lower
abdominal and pelvic pain.

EXAM:
CT ABDOMEN AND PELVIS WITH CONTRAST
TECHNIQUE: Multidetector CT imaging of the abdomen and pelvis was performed
using the standard protocol following bolus administration of
intravenous contrast.
CONTRAST:  100mL OMNIPAQUE IOHEXOL 300 MG/ML  SOLN

[Series 2: axial st · axial · 0.74mm/px · z∈[-453,-33]mm · 13 of 100 slices shown, 15 images]
[im 8/100  soft-tissue]
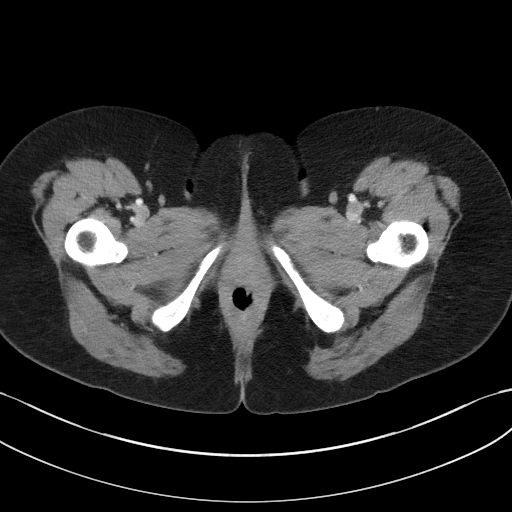
[im 8/100  bone]
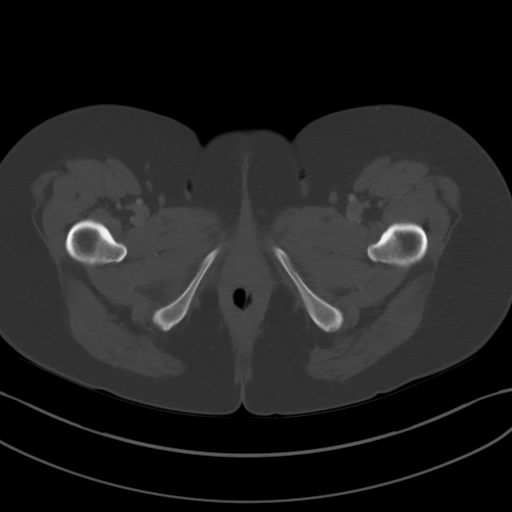
[im 15/100  soft-tissue]
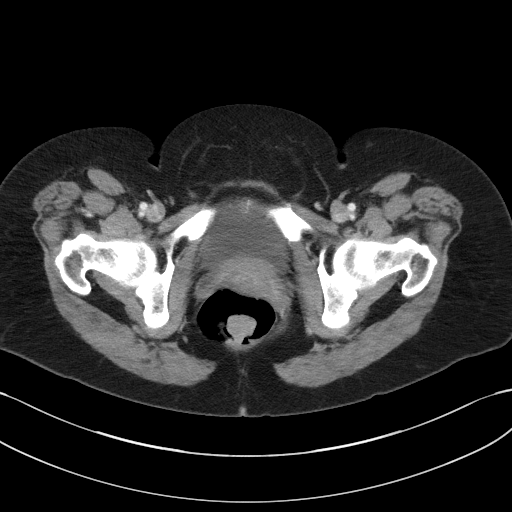
[im 22/100  soft-tissue]
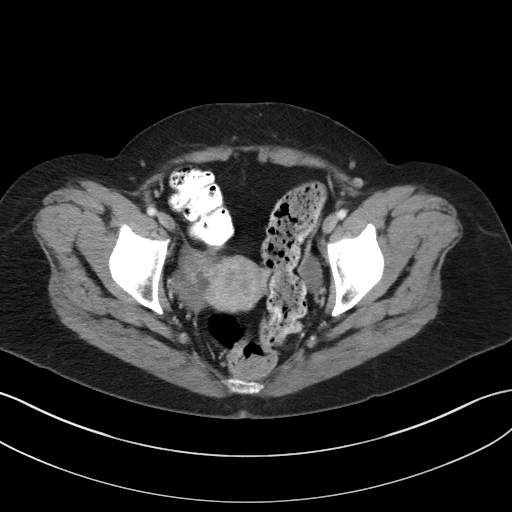
[im 29/100  soft-tissue]
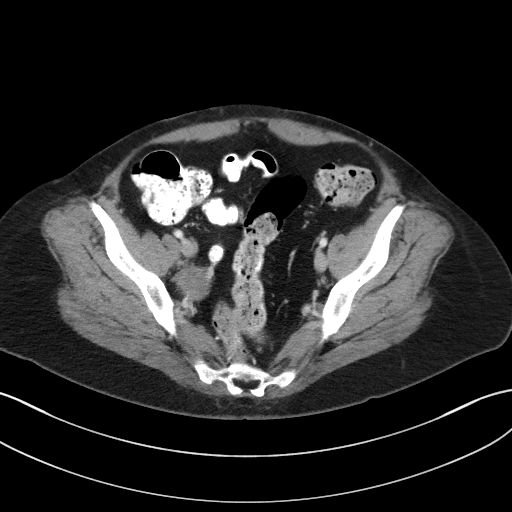
[im 36/100  soft-tissue]
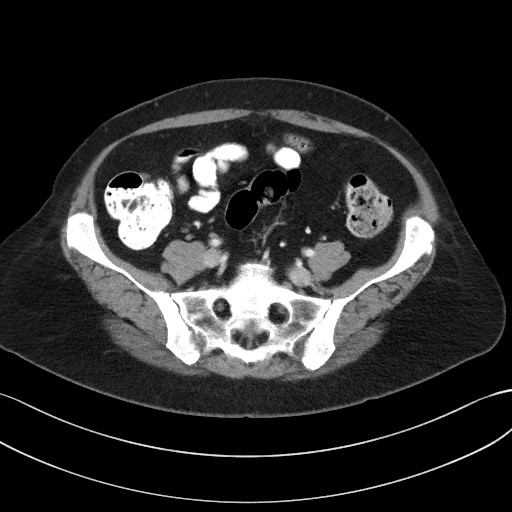
[im 43/100  soft-tissue]
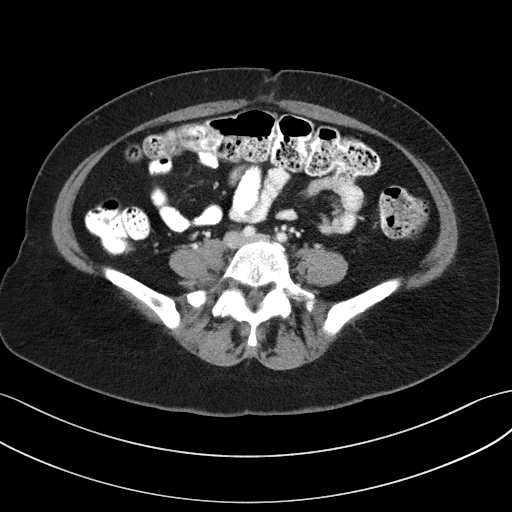
[im 50/100  soft-tissue]
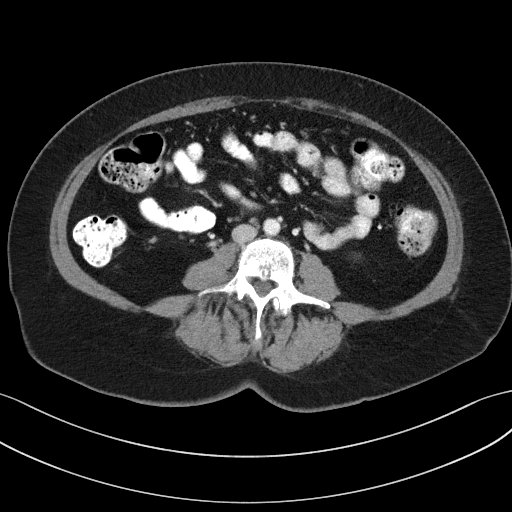
[im 57/100  soft-tissue]
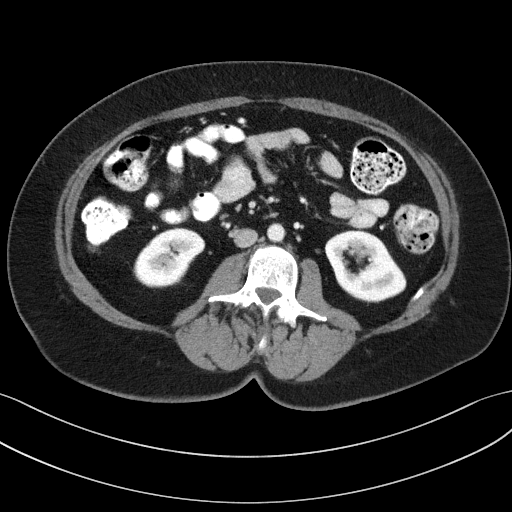
[im 64/100  soft-tissue]
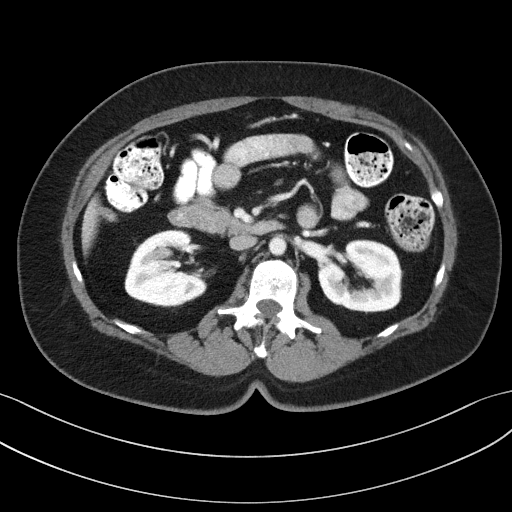
[im 64/100  bone]
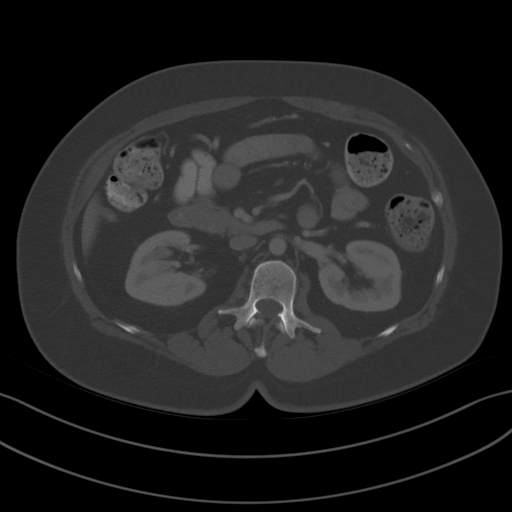
[im 71/100  soft-tissue]
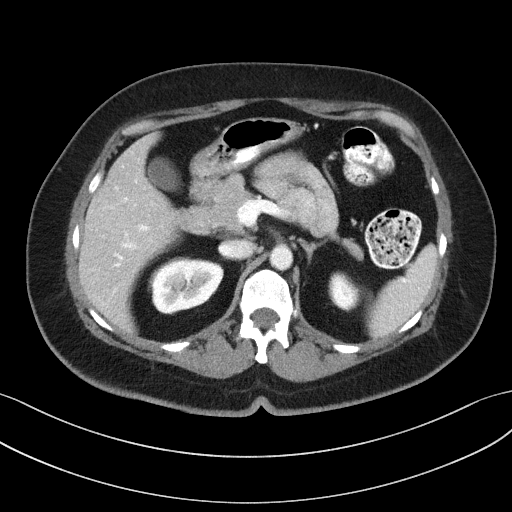
[im 78/100  soft-tissue]
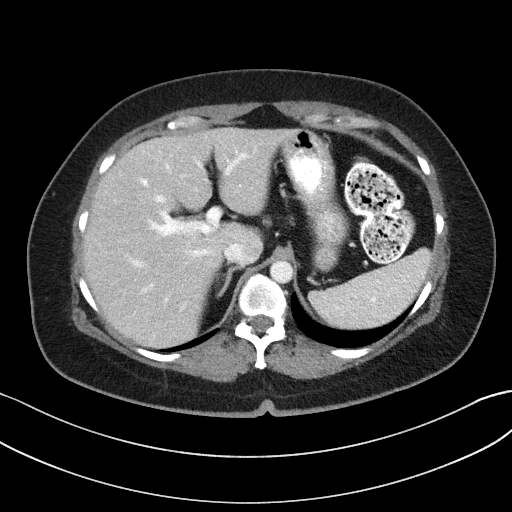
[im 85/100  soft-tissue]
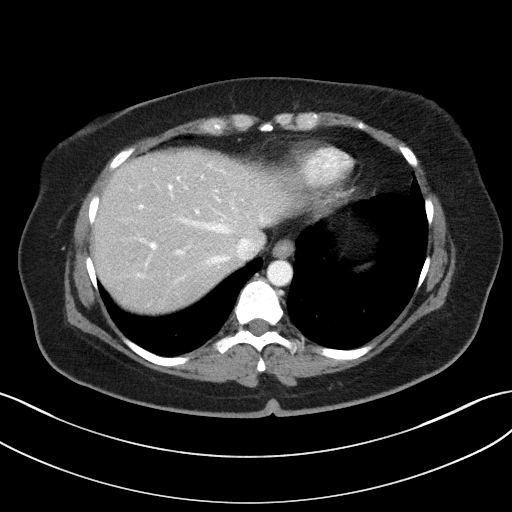
[im 92/100  soft-tissue]
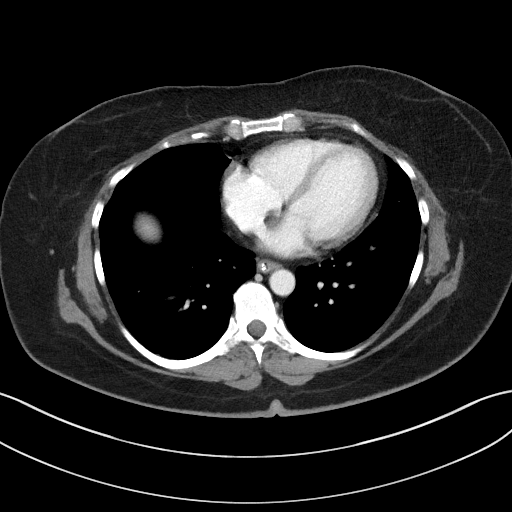

[Series 5: coronal st · coronal · 0.74mm/px · 3 of 101 slices shown]
[im 34/101  soft-tissue]
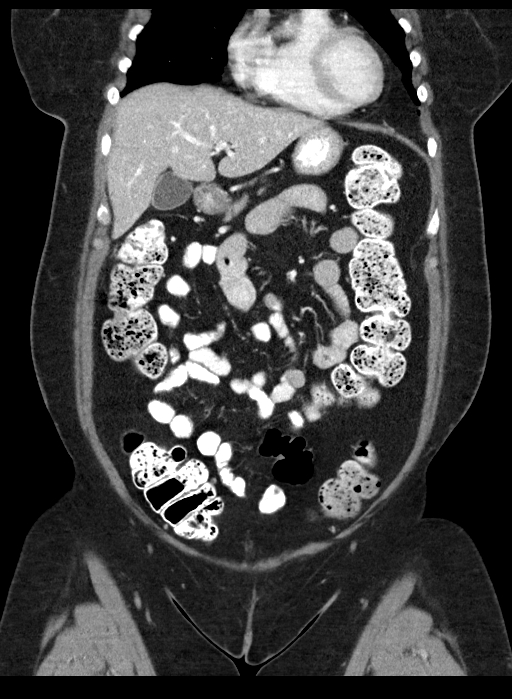
[im 45/101  soft-tissue]
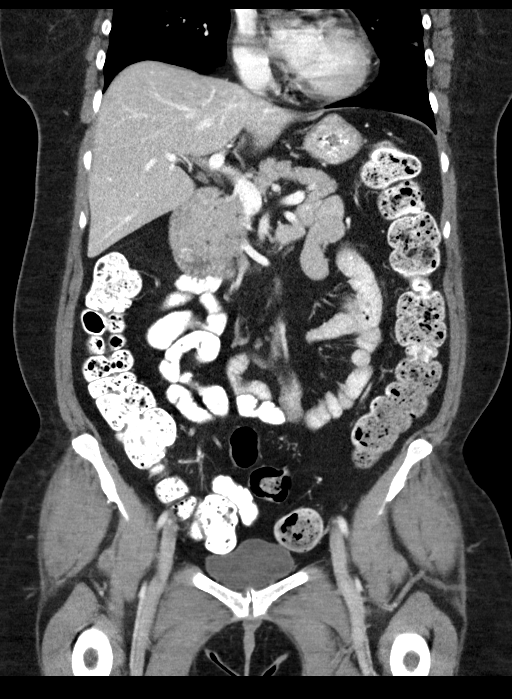
[im 56/101  soft-tissue]
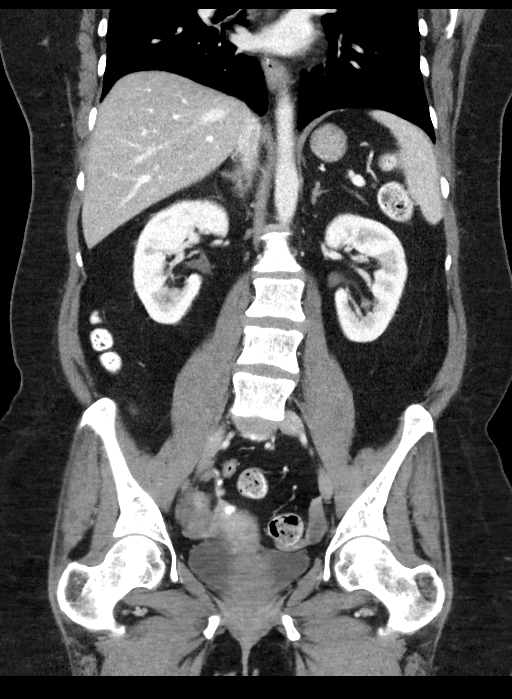

[16 of 46 positions shown; findings below may reference images not displayed]

FINDINGS: Lower chest: Lung bases are clear. No effusions. Heart is normal
size.

Hepatobiliary: No focal hepatic abnormality. Gallbladder
unremarkable.

Pancreas: No focal abnormality or ductal dilatation.

Spleen: No focal abnormality.  Normal size.

Adrenals/Urinary Tract: No adrenal abnormality. No focal renal
abnormality. No stones or hydronephrosis. Urinary bladder is
unremarkable.

Stomach/Bowel: Moderate stool burden. Scattered sigmoid diverticula.
Stomach and small bowel decompressed, unremarkable.

Vascular/Lymphatic: No evidence of aneurysm or adenopathy.

Reproductive: Uterus unremarkable. Bilateral ovarian follicles as
seen on prior ultrasound. Right ovary is mildly prominent relative
to the left.

Other: No free fluid or free air.

Musculoskeletal: No acute bony abnormality.
IMPRESSION: Small bilateral ovarian follicles with mild prominence of the right
ovary. These would be better characterized and followed with pelvic
ultrasound.

Moderate stool burden.

No acute findings in the abdomen or pelvis.

## 2020-03-14 MED ORDER — IOHEXOL 300 MG/ML  SOLN
100.0000 mL | Freq: Once | INTRAMUSCULAR | Status: AC | PRN
  Administered 2020-03-14: 100 mL via INTRAVENOUS

## 2020-03-14 MED ORDER — SODIUM CHLORIDE (PF) 0.9 % IJ SOLN
INTRAMUSCULAR | Status: AC
  Filled 2020-03-14: qty 50

## 2020-03-14 NOTE — Telephone Encounter (Signed)
Called patient with CT results.  No evidence of metastatic disease.  Discussed possible management options including repeat ultrasound in 6-8 weeks versus exploratory surgery.  Given her family history despite negative genetic testing as well as appearance of the ovaries, the patient feels strongly about BSO.  I think this is reasonable in the setting of her ultrasound findings.  We discussed protective benefits of leaving ovaries in situ including brain, bone, and cardiac health.  She voiced understanding of this and accepts any risk associated with ovarian removal in her late 64s after menopause.  I will have Melissa cross call the patient to decide on a surgical date.  Jeral Pinch MD Gynecologic Oncology

## 2020-03-15 ENCOUNTER — Other Ambulatory Visit: Payer: Self-pay | Admitting: Gynecologic Oncology

## 2020-03-15 ENCOUNTER — Telehealth: Payer: Self-pay | Admitting: Gynecologic Oncology

## 2020-03-15 DIAGNOSIS — N83202 Unspecified ovarian cyst, left side: Secondary | ICD-10-CM

## 2020-03-15 DIAGNOSIS — N83201 Unspecified ovarian cyst, right side: Secondary | ICD-10-CM

## 2020-03-15 MED ORDER — IBUPROFEN 800 MG PO TABS: 800.0000 mg | ORAL_TABLET | Freq: Three times a day (TID) | ORAL | 0 refills | Status: DC | PRN

## 2020-03-15 MED ORDER — OXYCODONE HCL 5 MG PO TABS: 5.0000 mg | ORAL_TABLET | ORAL | 0 refills | Status: DC | PRN

## 2020-03-15 MED ORDER — SENNOSIDES-DOCUSATE SODIUM 8.6-50 MG PO TABS: 2.0000 | ORAL_TABLET | Freq: Every day | ORAL | 0 refills | Status: DC

## 2020-03-15 NOTE — Progress Notes (Signed)
Post-operative medications (oxycodone, ibuprofen, and senna-kot S) prescribed pre-operatively so patient can have them available after surgery. Discussed recommendations for alternating tylenol and ibuprofen and taking the oxycodone only as needed for severe pain.

## 2020-03-15 NOTE — Telephone Encounter (Signed)
Preparing for your Surgery  Plan for surgery on March 28, 2020 with Dr. Jeral Pinch at [facility]. You will be scheduled for a robotic assisted bilateral salpingo-oophorectomy, possible staging if cancer identified.   Pre-operative Testing -You will receive a phone call from presurgical testing at [facility] to arrange for a pre-operative appointment over the phone, lab appointment, and COVID test. The COVID test normally happens 3 days prior to the surgery and they ask that you self quarantine after the test up until surgery to decrease chance of exposure.  -Bring your insurance card, copy of an advanced directive if applicable, medication list  -At that visit, you will be asked to sign a consent for a possible blood transfusion in case a transfusion becomes necessary during surgery.  The need for a blood transfusion is rare but having consent is a necessary part of your care.     -You should not be taking blood thinners or aspirin at least ten days prior to surgery unless instructed by your surgeon.  -Do not take supplements such as fish oil (omega 3), red yeast rice, turmeric before your surgery.   Day Before Surgery at [place] will be asked to take in a light diet the day before surgery.  Avoid carbonated beverages.  You will be advised you can have clear liquids after midnight and up until 3 hours before your surgery.    Eat a light diet the day before surgery.  Examples including soups, broths, toast, yogurt, mashed potatoes.  AVOID FOOD ITEMS THAT CAUSE GAS. Things to avoid include carbonated beverages (fizzy beverages), raw fruits and raw vegetables, or beans.   If your bowels are filled with gas, your surgeon will have difficulty visualizing your pelvic organs which increases your surgical risks.  Your role in recovery Your role is to become active as soon as directed by your doctor, while still giving yourself time to heal.   Rest when you feel tired. You will be asked to do the following in order to speed your recovery:  - Cough and breathe deeply. This helps to clear and expand your lungs and can prevent pneumonia after surgery.  - [place]. Do mild physical activity. Walking or moving your legs help your circulation and body functions return to normal. Do not try to get up or walk alone the first time after surgery.   -If you develop swelling on one leg or the other, pain in the back of your leg, redness/warmth in one of your legs, please call the office or go to the Emergency Room to have a doppler to rule out a blood clot. For shortness of breath, chest pain-seek care in the Emergency Room as soon as possible. - Actively manage your pain. Managing your pain lets you move in comfort. We will ask you to rate your pain on a scale of zero to 10. It is your responsibility to tell your doctor or nurse where and how much you hurt so your pain can be treated.  Special Considerations -If you are diabetic, you may be placed on insulin after surgery to have closer control over your blood sugars to promote healing and recovery.  This does not mean that you will be discharged on insulin.  If applicable, your oral antidiabetics will be resumed when you are tolerating a solid diet.  -Your final pathology results from surgery should be available around one week after surgery and the results  will be relayed to you when available.  -Dr. Lahoma Crocker is the surgeon that assists your GYN Oncologist with surgery.  If you end up staying the night, the next day after your surgery you will either see Dr. Denman George, Dr. Berline Lopes, or Dr. Lahoma Crocker.  -FMLA forms can be faxed to [phone] and please allow 5-7 business days for completion.  Pain Management After Surgery -You will be prescribed your pain medication and bowel regimen medications before surgery so that you can have these available when you are  discharged from the hospital. The pain medication is for use ONLY AFTER surgery and a new prescription will not be given.   -Make sure that you have Tylenol and Ibuprofen at home to use on a regular basis after surgery for pain control. We recommend alternating the medications every hour to six hours since they work differently and are processed in the body differently for pain relief.  -Review the attached handout on narcotic use and their risks and side effects.   Bowel Regimen -You will be prescribed Sennakot-S to take nightly to prevent constipation especially if you are taking the narcotic pain medication intermittently.  It is important to prevent constipation and drink adequate amounts of liquids. You can stop taking this medication when you are not taking pain medication and you are back on your normal bowel routine.  Risks of Surgery Risks of surgery are low but include bleeding, infection, damage to surrounding structures, re-operation, blood clots, and very rarely death.   Blood Transfusion Information (For the consent to be signed before surgery)  We will be checking your blood type before surgery so in case of emergencies, we will know what type of blood you would need.                                            WHAT IS A BLOOD TRANSFUSION?  A transfusion is the replacement of blood or some of its parts. Blood is made up of multiple cells which provide different functions.  Red blood cells carry oxygen and are used for blood loss replacement.  White blood cells fight against infection.  Platelets control bleeding.  Plasma helps clot blood.  Other blood products are available for specialized needs, such as hemophilia or other clotting disorders. BEFORE THE TRANSFUSION  Who gives blood for transfusions?   You may be able to donate blood to be used at a later date on yourself (autologous donation).  Relatives can be asked to donate blood. This is generally not any safer  than if you have received blood from a stranger. The same precautions are taken to ensure safety when a relative's blood is donated.  Healthy volunteers who are fully evaluated to make sure their blood is safe. This is blood bank blood. Transfusion therapy is the safest it has ever been in the practice of medicine. Before blood is taken from a donor, a complete history is taken to make sure that person has no history of diseases nor engages in risky social behavior (examples are intravenous drug use or sexual activity with multiple partners). The donor's travel history is screened to minimize risk of transmitting infections, such as malaria. The donated blood is tested for signs of infectious diseases, such as HIV and hepatitis. The blood is then tested to be sure it is compatible with you in order to minimize the  chance of a transfusion reaction. If you or a relative donates blood, this is often done in anticipation of surgery and is not appropriate for emergency situations. It takes many days to process the donated blood. RISKS AND COMPLICATIONS Although transfusion therapy is very safe and saves many lives, the main dangers of transfusion include:   Getting an infectious disease.  Developing a transfusion reaction. This is an allergic reaction to something in the blood you were given. Every precaution is taken to prevent this. The decision to have a blood transfusion has been considered carefully by your caregiver before blood is given. Blood is not given unless the benefits outweigh the risks.  AFTER SURGERY INSTRUCTIONS  Return to work: 4-6 weeks if applicable  Activity: 1. Be up and out of the bed during the day.  Take a nap if needed.  You may walk up steps but be careful and use the hand rail.  Stair climbing will tire you more than you think, you may need to stop part way and rest.   2. No lifting or straining for 4-6 weeks over 10 pounds. No pushing, pulling, straining for 6 weeks.  3.  No driving for around 1 week(s).  Do not drive if you are taking narcotic pain medicine and make sure that your reaction time has returned.   4. You can shower as soon as the next day after surgery. Shower daily.  Use soap and water on your incision and pat dry; don't rub.  No tub baths or submerging your body in water until cleared by your surgeon. If you have the soap that was given to you by pre-surgical testing that was used before surgery, you do not need to use it afterwards because this can irritate your incisions.   5. No sexual activity and nothing in the vagina for 2 weeks.  6. You may experience a small amount of clear drainage from your incisions, which is normal.  If the drainage persists, increases, or changes color please call the office.  7. Do not use creams, lotions, or ointments such as neosporin on your incisions after surgery until advised by your surgeon because they can cause removal of the dermabond glue on your incisions.    8. You may experience vaginal spotting after surgery. The spotting is normal but if you experience heavy bleeding, call our office.  9. Take Tylenol or ibuprofen first for pain and only use narcotic pain medication for severe pain not relieved by the Tylenol or Ibuprofen.  Monitor your Tylenol intake to a max of 4,000 mg.  Diet: 1. Low sodium Heart Healthy Diet is recommended.  2. It is safe to use a laxative, such as Miralax or Colace, if you have difficulty moving your bowels. You can take Sennakot at bedtime every evening to keep bowel movements regular and to prevent constipation.    Wound Care: 1. Keep clean and dry.  Shower daily.  Reasons to call the Doctor:  Fever - Oral temperature greater than 100.4 degrees Fahrenheit  Foul-smelling vaginal discharge  Difficulty urinating  Nausea and vomiting  Increased pain at the site of the incision that is unrelieved with pain medicine.  Difficulty breathing with or without chest  pain  New calf pain especially if only on one side  Sudden, continuing increased vaginal bleeding with or without clots.   Contacts: For questions or concerns you should contact:  Dr. Jeral Pinch at [phone]  Joylene John, NP at [phone]  After Hours: call [phone]  and have the GYN Oncologist paged/contacted

## 2020-03-15 NOTE — Telephone Encounter (Signed)
Called patient to follow up on conversation with Dr. Berline Lopes.  Discussed potential surgery date of April 29, which will work with her schedule.  Pre-operative information discussed along with post-operative instructions.  Information will be mailed ot the patient along with a work letter and map for the surgery center.  Plan will be for her to have her COVID test on April 26 then begin working from home until surgery.  She will plan to take off at least one week from work post-op. Advised to call for any needs and advised she would be contacted by the [place] and the office closer to her surgical date.

## 2020-03-16 ENCOUNTER — Other Ambulatory Visit: Payer: Self-pay | Admitting: Allergy and Immunology

## 2020-03-16 DIAGNOSIS — J45901 Unspecified asthma with (acute) exacerbation: Secondary | ICD-10-CM

## 2020-03-19 ENCOUNTER — Ambulatory Visit (INDEPENDENT_AMBULATORY_CARE_PROVIDER_SITE_OTHER): Payer: BC Managed Care – PPO

## 2020-03-19 DIAGNOSIS — J309 Allergic rhinitis, unspecified: Secondary | ICD-10-CM

## 2020-03-21 ENCOUNTER — Other Ambulatory Visit: Payer: Self-pay

## 2020-03-21 ENCOUNTER — Encounter (HOSPITAL_BASED_OUTPATIENT_CLINIC_OR_DEPARTMENT_OTHER): Payer: Self-pay | Admitting: Gynecologic Oncology

## 2020-03-21 NOTE — Progress Notes (Addendum)
Spoke w/ via phone for pre-op interview---patient Lab needs dos----   Urine preg           Lab results------has lab appt 03-25-2020 @900  am for ekg, cbc, bmet type and screen and ua COVID test ------03-25-2020 @ 840 Arrive at -------900 am 03-28-2020 No food after midnight clear liquids no carbonated beverages until 800 am then npo Medications to take morning of surgery -----hydrozyazine prn, symbicort and proair inhalers prn/bring inhalers, qvar inhaler, fexofexadine, estrodial, levothyroxine and na np thryoid  Diabetic medication -----none day of surgery Patient Special Instructions -----light diet with no carbonated beverages day before surgery Pre-Op special Istructions ----- Patient verbalized understanding of instructions that were given at this phone interview. Patient denies shortness of breath, chest pain, fever, cough a this phone interview.

## 2020-03-25 ENCOUNTER — Other Ambulatory Visit (HOSPITAL_COMMUNITY)
Admission: RE | Admit: 2020-03-25 | Discharge: 2020-03-25 | Disposition: A | Discharge status: Home / Self Care | Payer: BC Managed Care – PPO | Source: Ambulatory Visit | Attending: Gynecologic Oncology | Admitting: Gynecologic Oncology

## 2020-03-25 ENCOUNTER — Encounter (HOSPITAL_COMMUNITY)
Admission: RE | Admit: 2020-03-25 | Discharge: 2020-03-25 | Disposition: A | Discharge status: Home / Self Care | Payer: BC Managed Care – PPO | Source: Ambulatory Visit | Attending: Gynecologic Oncology | Admitting: Gynecologic Oncology

## 2020-03-25 ENCOUNTER — Other Ambulatory Visit: Payer: Self-pay

## 2020-03-25 DIAGNOSIS — Z01818 Encounter for other preprocedural examination: Secondary | ICD-10-CM | POA: Diagnosis present

## 2020-03-25 DIAGNOSIS — R9431 Abnormal electrocardiogram [ECG] [EKG]: Secondary | ICD-10-CM | POA: Insufficient documentation

## 2020-03-25 DIAGNOSIS — Z20822 Contact with and (suspected) exposure to covid-19: Secondary | ICD-10-CM | POA: Diagnosis not present

## 2020-03-25 LAB — URINALYSIS, ROUTINE W REFLEX MICROSCOPIC
Bilirubin Urine: NEGATIVE
Glucose, UA: NEGATIVE mg/dL
Hgb urine dipstick: NEGATIVE
Ketones, ur: NEGATIVE mg/dL
Leukocytes,Ua: NEGATIVE
Nitrite: NEGATIVE
Protein, ur: NEGATIVE mg/dL
Specific Gravity, Urine: 1.017 (ref 1.005–1.030)
pH: 5 (ref 5.0–8.0)

## 2020-03-25 LAB — CBC
HCT: 44.9 % (ref 36.0–46.0)
Hemoglobin: 14.6 g/dL (ref 12.0–15.0)
MCH: 28.6 pg (ref 26.0–34.0)
MCHC: 32.5 g/dL (ref 30.0–36.0)
MCV: 87.9 fL (ref 80.0–100.0)
Platelets: 334 10*3/uL (ref 150–400)
RBC: 5.11 MIL/uL (ref 3.87–5.11)
RDW: 12.1 % (ref 11.5–15.5)
WBC: 8.3 10*3/uL (ref 4.0–10.5)
nRBC: 0 % (ref 0.0–0.2)

## 2020-03-25 LAB — BASIC METABOLIC PANEL
Anion gap: 10 (ref 5–15)
BUN: 21 mg/dL — ABNORMAL HIGH (ref 6–20)
CO2: 24 mmol/L (ref 22–32)
Calcium: 8.9 mg/dL (ref 8.9–10.3)
Chloride: 102 mmol/L (ref 98–111)
Creatinine, Ser: 0.68 mg/dL (ref 0.44–1.00)
GFR calc Af Amer: >60 mL/min (ref >60)
GFR calc non Af Amer: >60 mL/min (ref >60)
Glucose, Bld: 134 mg/dL — ABNORMAL HIGH (ref 70–99)
Potassium: 4.6 mmol/L (ref 3.5–5.1)
Sodium: 136 mmol/L (ref 135–145)

## 2020-03-25 LAB — PREGNANCY, URINE: Preg Test, Ur: NEGATIVE

## 2020-03-25 LAB — ABO/RH: ABO/RH(D): O POS

## 2020-03-25 LAB — SARS CORONAVIRUS 2 (TAT 6-24 HRS): SARS Coronavirus 2: NEGATIVE

## 2020-03-27 ENCOUNTER — Telehealth: Payer: Self-pay

## 2020-03-27 NOTE — Anesthesia Preprocedure Evaluation (Addendum)
Anesthesia Evaluation  Patient identified by MRN, date of birth, ID band Patient awake    Reviewed: Allergy & Precautions, NPO status   Airway Mallampati: II  TM Distance: >3 FB     Dental   Pulmonary    breath sounds clear to auscultation       Cardiovascular negative cardio ROS   Rhythm:Regular Rate:Normal     Neuro/Psych    GI/Hepatic negative GI ROS,   Endo/Other  diabetesHypothyroidism Hyperthyroidism   Renal/GU      Musculoskeletal   Abdominal   Peds  Hematology   Anesthesia Other Findings   Reproductive/Obstetrics                            Anesthesia Physical Anesthesia Plan  ASA: III  Anesthesia Plan: General   Post-op Pain Management:    Induction: Intravenous  PONV Risk Score and Plan: 3  Airway Management Planned: Oral ETT  Additional Equipment:   Intra-op Plan:   Post-operative Plan: Extubation in OR  Informed Consent: I have reviewed the patients History and Physical, chart, labs and discussed the procedure including the risks, benefits and alternatives for the proposed anesthesia with the patient or authorized representative who has indicated his/her understanding and acceptance.     Dental advisory given  Plan Discussed with: Anesthesiologist and CRNA  Anesthesia Plan Comments:        Anesthesia Quick Evaluation

## 2020-03-27 NOTE — Telephone Encounter (Signed)
Faxed hysterectomy form to the surgery center to be signed in am 03-28-20 as pt could possibly have a hysterectomy during surgery tomorrow with Dr. Jeral Pinch.

## 2020-03-27 NOTE — Telephone Encounter (Signed)
Ms Worman states that she understands her pre op instructions and does not have any questions or concerns at this time.  She is aware of the surgery being moved up to 0730 tomorrow and that she needs to arrive at the surgery center at [place].

## 2020-03-28 ENCOUNTER — Ambulatory Visit (HOSPITAL_BASED_OUTPATIENT_CLINIC_OR_DEPARTMENT_OTHER): Payer: BC Managed Care – PPO | Admitting: Anesthesiology

## 2020-03-28 ENCOUNTER — Ambulatory Visit (HOSPITAL_BASED_OUTPATIENT_CLINIC_OR_DEPARTMENT_OTHER)
Admission: RE | Admit: 2020-03-28 | Discharge: 2020-03-28 | Disposition: A | Discharge status: Home / Self Care | Payer: BC Managed Care – PPO | Attending: Gynecologic Oncology | Admitting: Gynecologic Oncology

## 2020-03-28 ENCOUNTER — Encounter (HOSPITAL_BASED_OUTPATIENT_CLINIC_OR_DEPARTMENT_OTHER)
Admission: RE | Disposition: A | Discharge status: Home / Self Care | Payer: Self-pay | Source: Home / Self Care | Attending: Gynecologic Oncology

## 2020-03-28 ENCOUNTER — Encounter (HOSPITAL_BASED_OUTPATIENT_CLINIC_OR_DEPARTMENT_OTHER): Payer: Self-pay | Admitting: Gynecologic Oncology

## 2020-03-28 DIAGNOSIS — N83201 Unspecified ovarian cyst, right side: Secondary | ICD-10-CM

## 2020-03-28 DIAGNOSIS — Z7984 Long term (current) use of oral hypoglycemic drugs: Secondary | ICD-10-CM | POA: Diagnosis not present

## 2020-03-28 DIAGNOSIS — J45909 Unspecified asthma, uncomplicated: Secondary | ICD-10-CM | POA: Diagnosis not present

## 2020-03-28 DIAGNOSIS — Z79899 Other long term (current) drug therapy: Secondary | ICD-10-CM | POA: Insufficient documentation

## 2020-03-28 DIAGNOSIS — Z833 Family history of diabetes mellitus: Secondary | ICD-10-CM | POA: Insufficient documentation

## 2020-03-28 DIAGNOSIS — E119 Type 2 diabetes mellitus without complications: Secondary | ICD-10-CM | POA: Diagnosis not present

## 2020-03-28 DIAGNOSIS — N83202 Unspecified ovarian cyst, left side: Secondary | ICD-10-CM | POA: Diagnosis not present

## 2020-03-28 DIAGNOSIS — Z7951 Long term (current) use of inhaled steroids: Secondary | ICD-10-CM | POA: Insufficient documentation

## 2020-03-28 DIAGNOSIS — N854 Malposition of uterus: Secondary | ICD-10-CM | POA: Diagnosis not present

## 2020-03-28 DIAGNOSIS — E890 Postprocedural hypothyroidism: Secondary | ICD-10-CM | POA: Diagnosis not present

## 2020-03-28 DIAGNOSIS — D271 Benign neoplasm of left ovary: Secondary | ICD-10-CM | POA: Insufficient documentation

## 2020-03-28 DIAGNOSIS — K660 Peritoneal adhesions (postprocedural) (postinfection): Secondary | ICD-10-CM | POA: Diagnosis not present

## 2020-03-28 DIAGNOSIS — D270 Benign neoplasm of right ovary: Secondary | ICD-10-CM | POA: Diagnosis not present

## 2020-03-28 DIAGNOSIS — R1909 Other intra-abdominal and pelvic swelling, mass and lump: Secondary | ICD-10-CM | POA: Diagnosis present

## 2020-03-28 HISTORY — DX: Sleep apnea, unspecified: G47.30

## 2020-03-28 HISTORY — PX: ROBOTIC ASSISTED SALPINGO OOPHERECTOMY: SHX6082

## 2020-03-28 HISTORY — DX: Prediabetes: R73.03

## 2020-03-28 HISTORY — DX: Abnormal uterine and vaginal bleeding, unspecified: N93.9

## 2020-03-28 LAB — POCT PREGNANCY, URINE: Preg Test, Ur: NEGATIVE

## 2020-03-28 LAB — TYPE AND SCREEN
ABO/RH(D): O POS
Antibody Screen: NEGATIVE

## 2020-03-28 SURGERY — SALPINGO-OOPHORECTOMY, ROBOT-ASSISTED
Anesthesia: General | Site: Abdomen | Laterality: Bilateral

## 2020-03-28 MED ORDER — PROPOFOL 10 MG/ML IV BOLUS
INTRAVENOUS | Status: AC
  Filled 2020-03-28: qty 40

## 2020-03-28 MED ORDER — BUPIVACAINE HCL 0.25 % IJ SOLN
INTRAMUSCULAR | Status: DC | PRN
  Administered 2020-03-28: 26 mL

## 2020-03-28 MED ORDER — FENTANYL CITRATE (PF) 100 MCG/2ML IJ SOLN
INTRAMUSCULAR | Status: AC
  Filled 2020-03-28: qty 2

## 2020-03-28 MED ORDER — ROCURONIUM BROMIDE 100 MG/10ML IV SOLN
INTRAVENOUS | Status: DC | PRN
  Administered 2020-03-28: 20 mg via INTRAVENOUS
  Administered 2020-03-28: 60 mg via INTRAVENOUS
  Administered 2020-03-28: 20 mg via INTRAVENOUS

## 2020-03-28 MED ORDER — FENTANYL CITRATE (PF) 100 MCG/2ML IJ SOLN: 25.0000 ug | INTRAMUSCULAR | Status: DC | PRN

## 2020-03-28 MED ORDER — ONDANSETRON HCL 4 MG/2ML IJ SOLN
INTRAMUSCULAR | Status: DC | PRN
Start: 2020-03-28 — End: 2020-03-28
  Administered 2020-03-28: 4 mg via INTRAVENOUS

## 2020-03-28 MED ORDER — ACETAMINOPHEN 325 MG RE SUPP: 650.0000 mg | RECTAL | Status: DC | PRN

## 2020-03-28 MED ORDER — DEXAMETHASONE SODIUM PHOSPHATE 10 MG/ML IJ SOLN
INTRAMUSCULAR | Status: AC
  Filled 2020-03-28: qty 1

## 2020-03-28 MED ORDER — LABETALOL HCL 5 MG/ML IV SOLN
INTRAVENOUS | Status: DC | PRN
  Administered 2020-03-28 (×2): 5 mg via INTRAVENOUS

## 2020-03-28 MED ORDER — ACETAMINOPHEN 500 MG PO TABS
ORAL_TABLET | ORAL | Status: AC
  Filled 2020-03-28: qty 2

## 2020-03-28 MED ORDER — SUGAMMADEX SODIUM 200 MG/2ML IV SOLN
INTRAVENOUS | Status: DC | PRN
  Administered 2020-03-28: 225 mg via INTRAVENOUS

## 2020-03-28 MED ORDER — SUGAMMADEX SODIUM 500 MG/5ML IV SOLN
INTRAVENOUS | Status: AC
  Filled 2020-03-28: qty 5

## 2020-03-28 MED ORDER — KETOROLAC TROMETHAMINE 30 MG/ML IJ SOLN
INTRAMUSCULAR | Status: AC
  Filled 2020-03-28: qty 1

## 2020-03-28 MED ORDER — MIDAZOLAM HCL 5 MG/5ML IJ SOLN
INTRAMUSCULAR | Status: DC | PRN
  Administered 2020-03-28: 2 mg via INTRAVENOUS

## 2020-03-28 MED ORDER — GABAPENTIN 300 MG PO CAPS
300.0000 mg | ORAL_CAPSULE | ORAL | Status: AC
  Administered 2020-03-28: 300 mg via ORAL

## 2020-03-28 MED ORDER — ACETAMINOPHEN 325 MG PO TABS: 650.0000 mg | ORAL_TABLET | ORAL | Status: DC | PRN

## 2020-03-28 MED ORDER — SODIUM CHLORIDE 0.9% FLUSH: 3.0000 mL | INTRAVENOUS | Status: DC | PRN

## 2020-03-28 MED ORDER — SODIUM CHLORIDE 0.9% FLUSH: 3.0000 mL | Freq: Two times a day (BID) | INTRAVENOUS | Status: DC

## 2020-03-28 MED ORDER — FENTANYL CITRATE (PF) 250 MCG/5ML IJ SOLN
INTRAMUSCULAR | Status: AC
  Filled 2020-03-28: qty 5

## 2020-03-28 MED ORDER — CELECOXIB 200 MG PO CAPS
400.0000 mg | ORAL_CAPSULE | ORAL | Status: AC
  Administered 2020-03-28: 400 mg via ORAL

## 2020-03-28 MED ORDER — SCOPOLAMINE 1 MG/3DAYS TD PT72
MEDICATED_PATCH | TRANSDERMAL | Status: AC
  Filled 2020-03-28: qty 1

## 2020-03-28 MED ORDER — HEPARIN SODIUM (PORCINE) 5000 UNIT/ML IJ SOLN
INTRAMUSCULAR | Status: AC
  Filled 2020-03-28: qty 1

## 2020-03-28 MED ORDER — MIDAZOLAM HCL 2 MG/2ML IJ SOLN
INTRAMUSCULAR | Status: AC
  Filled 2020-03-28: qty 2

## 2020-03-28 MED ORDER — SODIUM CHLORIDE 0.9 % IV SOLN: 250.0000 mL | INTRAVENOUS | Status: DC | PRN

## 2020-03-28 MED ORDER — ONDANSETRON HCL 4 MG/2ML IJ SOLN
INTRAMUSCULAR | Status: AC
  Filled 2020-03-28: qty 2

## 2020-03-28 MED ORDER — PROPOFOL 10 MG/ML IV BOLUS
INTRAVENOUS | Status: DC | PRN
  Administered 2020-03-28: 200 mg via INTRAVENOUS
  Administered 2020-03-28: 100 mg via INTRAVENOUS

## 2020-03-28 MED ORDER — ACETAMINOPHEN 500 MG PO TABS
1000.0000 mg | ORAL_TABLET | ORAL | Status: AC
  Administered 2020-03-28: 1000 mg via ORAL

## 2020-03-28 MED ORDER — GABAPENTIN 300 MG PO CAPS
ORAL_CAPSULE | ORAL | Status: AC
  Filled 2020-03-28: qty 1

## 2020-03-28 MED ORDER — OXYCODONE HCL 5 MG PO TABS: 5.0000 mg | ORAL_TABLET | ORAL | Status: DC | PRN

## 2020-03-28 MED ORDER — LIDOCAINE HCL (CARDIAC) PF 100 MG/5ML IV SOSY
PREFILLED_SYRINGE | INTRAVENOUS | Status: DC | PRN
  Administered 2020-03-28: 60 mg via INTRAVENOUS

## 2020-03-28 MED ORDER — LIDOCAINE 2% (20 MG/ML) 5 ML SYRINGE
INTRAMUSCULAR | Status: AC
  Filled 2020-03-28: qty 5

## 2020-03-28 MED ORDER — CELECOXIB 200 MG PO CAPS
ORAL_CAPSULE | ORAL | Status: AC
  Filled 2020-03-28: qty 2

## 2020-03-28 MED ORDER — SCOPOLAMINE 1 MG/3DAYS TD PT72
1.0000 | MEDICATED_PATCH | TRANSDERMAL | Status: DC
  Administered 2020-03-28: 1.5 mg via TRANSDERMAL

## 2020-03-28 MED ORDER — HEPARIN SODIUM (PORCINE) 5000 UNIT/ML IJ SOLN
5000.0000 [IU] | INTRAMUSCULAR | Status: AC
  Administered 2020-03-28: 5000 [IU] via SUBCUTANEOUS

## 2020-03-28 MED ORDER — FENTANYL CITRATE (PF) 100 MCG/2ML IJ SOLN
INTRAMUSCULAR | Status: DC | PRN
  Administered 2020-03-28: 50 ug via INTRAVENOUS
  Administered 2020-03-28: 100 ug via INTRAVENOUS
  Administered 2020-03-28 (×6): 50 ug via INTRAVENOUS

## 2020-03-28 MED ORDER — LABETALOL HCL 5 MG/ML IV SOLN
INTRAVENOUS | Status: AC
  Filled 2020-03-28: qty 4

## 2020-03-28 MED ORDER — ROCURONIUM BROMIDE 10 MG/ML (PF) SYRINGE
PREFILLED_SYRINGE | INTRAVENOUS | Status: AC
  Filled 2020-03-28: qty 10

## 2020-03-28 MED ORDER — DEXAMETHASONE SODIUM PHOSPHATE 10 MG/ML IJ SOLN: 4.0000 mg | INTRAMUSCULAR | Status: DC

## 2020-03-28 MED ORDER — DEXAMETHASONE SODIUM PHOSPHATE 4 MG/ML IJ SOLN
INTRAMUSCULAR | Status: DC | PRN
  Administered 2020-03-28: 10 mg via INTRAVENOUS

## 2020-03-28 MED ORDER — SODIUM CHLORIDE 0.9 % IR SOLN
Status: DC | PRN
  Administered 2020-03-28: 1000 mL

## 2020-03-28 MED ORDER — LACTATED RINGERS IV SOLN: INTRAVENOUS | Status: DC

## 2020-03-28 SURGICAL SUPPLY — 62 items
APPLICATOR SURGIFLO ENDO (HEMOSTASIS) IMPLANT
BAG LAPAROSCOPIC 12 15 PORT 16 (BASKET) IMPLANT
BAG RETRIEVAL 12/15 (BASKET)
BLADE SURG 10 STRL SS (BLADE) IMPLANT
COVER BACK TABLE 60X90IN (DRAPES) ×2 IMPLANT
COVER TIP SHEARS 8 DVNC (MISCELLANEOUS) ×1 IMPLANT
COVER TIP SHEARS 8MM DA VINCI (MISCELLANEOUS) ×1
COVER WAND RF STERILE (DRAPES) ×2 IMPLANT
DECANTER SPIKE VIAL GLASS SM (MISCELLANEOUS) IMPLANT
DERMABOND ADVANCED (GAUZE/BANDAGES/DRESSINGS) ×1
DERMABOND ADVANCED .7 DNX12 (GAUZE/BANDAGES/DRESSINGS) ×1 IMPLANT
DRAPE ARM DVNC X/XI (DISPOSABLE) ×4 IMPLANT
DRAPE COLUMN DVNC XI (DISPOSABLE) ×1 IMPLANT
DRAPE DA VINCI XI ARM (DISPOSABLE) ×4
DRAPE DA VINCI XI COLUMN (DISPOSABLE) ×1
DRAPE SHEET LG 3/4 BI-LAMINATE (DRAPES) ×2 IMPLANT
DRAPE SURG IRRIG POUCH 19X23 (DRAPES) ×2 IMPLANT
DRSG TEGADERM 8X12 (GAUZE/BANDAGES/DRESSINGS) ×2 IMPLANT
ELECT REM PT RETURN 9FT ADLT (ELECTROSURGICAL) ×2
ELECTRODE REM PT RTRN 9FT ADLT (ELECTROSURGICAL) ×1 IMPLANT
GAUZE 4X4 16PLY RFD (DISPOSABLE) ×2 IMPLANT
GLOVE BIO SURGEON STRL SZ 6 (GLOVE) ×8 IMPLANT
GLOVE BIO SURGEON STRL SZ 6.5 (GLOVE) ×4 IMPLANT
HOLDER FOLEY CATH W/STRAP (MISCELLANEOUS) ×2 IMPLANT
IRRIG SUCT STRYKERFLOW 2 WTIP (MISCELLANEOUS) ×2
IRRIGATION SUCT STRKRFLW 2 WTP (MISCELLANEOUS) ×1 IMPLANT
KIT PROCEDURE DA VINCI SI (MISCELLANEOUS)
KIT PROCEDURE DVNC SI (MISCELLANEOUS) IMPLANT
KIT TURNOVER CYSTO (KITS) IMPLANT
LEGGING LITHOTOMY PAIR STRL (DRAPES) ×2 IMPLANT
MANIPULATOR UTERINE 4.5 ZUMI (MISCELLANEOUS) ×2 IMPLANT
NDL SAFETY ECLIPSE 18X1.5 (NEEDLE) IMPLANT
NEEDLE HYPO 18GX1.5 SHARP (NEEDLE)
NEEDLE HYPO 22GX1.5 SAFETY (NEEDLE) ×2 IMPLANT
NEEDLE SPNL 18GX3.5 QUINCKE PK (NEEDLE) IMPLANT
OBTURATOR OPTICAL STANDARD 8MM (TROCAR) ×1
OBTURATOR OPTICAL STND 8 DVNC (TROCAR) ×1
OBTURATOR OPTICALSTD 8 DVNC (TROCAR) ×1 IMPLANT
PACK ROBOT GYN WLCUSTOM (TRAY / TRAY PROCEDURE) ×2 IMPLANT
PACK ROBOTIC GOWN (GOWN DISPOSABLE) ×2 IMPLANT
PAD POSITIONING PINK XL (MISCELLANEOUS) ×2 IMPLANT
PENCIL BUTTON HOLSTER BLD 10FT (ELECTRODE) IMPLANT
PORT ACCESS TROCAR AIRSEAL 12 (TROCAR) ×1 IMPLANT
PORT ACCESS TROCAR AIRSEAL 5M (TROCAR) ×1
POUCH SPECIMEN RETRIEVAL 10MM (ENDOMECHANICALS) ×4 IMPLANT
SEAL CANN UNIV 5-8 DVNC XI (MISCELLANEOUS) ×3 IMPLANT
SEAL XI 5MM-8MM UNIVERSAL (MISCELLANEOUS) ×3
SET TRI-LUMEN FLTR TB AIRSEAL (TUBING) ×2 IMPLANT
SURGIFLO W/THROMBIN 8M KIT (HEMOSTASIS) IMPLANT
SUT VIC AB 0 CT1 36 (SUTURE) IMPLANT
SUT VIC AB 3-0 SH 27 (SUTURE)
SUT VIC AB 3-0 SH 27X BRD (SUTURE) IMPLANT
SUT VIC AB 4-0 PS2 18 (SUTURE) ×4 IMPLANT
SYR 10ML LL (SYRINGE) IMPLANT
SYR CONTROL 10ML LL (SYRINGE) ×4 IMPLANT
TRAP SPECIMEN MUCUS 40CC (MISCELLANEOUS) ×2 IMPLANT
TRAY FOLEY W/BAG SLVR 14FR (SET/KITS/TRAYS/PACK) ×2 IMPLANT
TROCAR BLADELESS OPT 12M 100M (ENDOMECHANICALS) IMPLANT
TUBE CONNECTING 12X1/4 (SUCTIONS) ×2 IMPLANT
UNDERPAD 30X30 (UNDERPADS AND DIAPERS) ×2 IMPLANT
WATER STERILE IRR 1000ML POUR (IV SOLUTION) IMPLANT
YANKAUER SUCT BULB TIP NO VENT (SUCTIONS) IMPLANT

## 2020-03-28 NOTE — Discharge Instructions (Signed)
03/28/2020  Return to work: 4-6 weeks  Activity: 1. Be up and out of the bed during the day.  Take a nap if needed.  You may walk up steps but be careful and use the hand rail.  Stair climbing will tire you more than you think, you may need to stop part way and rest.   2. No lifting or straining for 6 weeks.  3. No driving for 1-2 weeks.  Do Not drive if you are taking narcotic pain medicine and you feel that you can brake safely.  4. Shower daily.  Use soap and water on your incision and pat dry; don't rub.   5. No sexual activity and nothing in the vagina for 4-6 weeks.  Medications:  - Take ibuprofen and tylenol first line for pain control. Take these regularly (every 6 hours) to decrease the build up of pain.  - If necessary, for severe pain not relieved by ibuprofen, take percocet.  - While taking percocet you should take sennakot every night to reduce the likelihood of constipation. If this causes diarrhea, stop its use.  Diet: 1. Low sodium Heart Healthy Diet is recommended.  2. It is safe to use a laxative if you have difficulty moving your bowels.   Wound Care: 1. Keep clean and dry.  Shower daily.  Reasons to call the Doctor:   Fever - Oral temperature greater than 100.4 degrees Fahrenheit  Foul-smelling vaginal discharge  Difficulty urinating  Nausea and vomiting  Increased pain at the site of the incision that is unrelieved with pain medicine.  Difficulty breathing with or without chest pain  New calf pain especially if only on one side  Sudden, continuing increased vaginal bleeding with or without clots.   Follow-up: 1. See Jeral Pinch in 3 weeks.  Contacts: For questions or concerns you should contact:  Dr. Jeral Pinch at [phone] After hours and on week-ends call [phone] and ask to speak to the physician on call for Gynecologic Oncology   Post Anesthesia Home Care Instructions  Activity: Get plenty of rest for the  remainder of the day. A responsible individual must stay with you for 24 hours following the procedure.  For the next 24 hours, DO NOT: -Drive a car -Paediatric nurse -Drink alcoholic beverages -Take any medication unless instructed by your physician -Make any legal decisions or sign important papers.  Meals: Start with liquid foods such as gelatin or soup. Progress to regular foods as tolerated. Avoid greasy, spicy, heavy foods. If nausea and/or vomiting occur, drink only clear liquids until the nausea and/or vomiting subsides. Call your physician if vomiting continues.  Special Instructions/Symptoms: Your throat may feel dry or sore from the anesthesia or the breathing tube placed in your throat during surgery. If this causes discomfort, gargle with warm salt water. The discomfort should disappear within 24 hours.  If you had a scopolamine patch placed behind your ear for the management of post- operative nausea and/or vomiting:  1. The medication in the patch is effective for 72 hours, after which it should be removed.  Wrap patch in a tissue and discard in the trash. Wash hands thoroughly with soap and water. 2. You may remove the patch earlier than 72 hours if you experience unpleasant side effects which may include dry mouth, dizziness or visual disturbances. 3. Avoid touching the patch. Wash your hands with soap and water after contact with the patch.   Bilateral Salpingo-Oophorectomy, Care After This sheet gives you information  about how to care for yourself after your procedure. Your health care provider may also give you more specific instructions. If you have problems or questions, contact your health care provider. What can I expect after the procedure? After the procedure, it is common to have:  Abdominal pain.  Some occasional vaginal bleeding (spotting).  Tiredness.  Symptoms of menopause, such as hot flashes, night sweats, or mood swings. Follow these instructions at  home: Incision care   Keep your incision area and your bandage (dressing) clean and dry.  Follow instructions from your health care provider about how to take care of your incision. Make sure you: ? Wash your hands with soap and water before you change your dressing. If soap and water are not available, use hand sanitizer. ? Change your dressing as told by your health care provider. ? Leave stitches (sutures), staples, skin glue, or adhesive strips in place. These skin closures may need to stay in place for 2 weeks or longer. If adhesive strip edges start to loosen and curl up, you may trim the loose edges. Do not remove adhesive strips completely unless your health care provider tells you to do that.  Check your incision area every day for signs of infection. Check for: ? Redness, swelling, or pain. ? Fluid or blood. ? Warmth. ? Pus or a bad smell. Activity   Do not drive or use heavy machinery while taking prescription pain medicine.  Do not drive for 24 hours if you received a medicine to help you relax (sedative) during your procedure.  Take frequent, short walks throughout the day. Rest when you get tired. Ask your health care provider what activities are safe for you.  Avoid activity that requires great effort. Also, avoid heavy lifting. Do not lift anything that is heavier than 10 lbs. (4.5 kg), or the limit that your health care provider tells you, until he or she says that it is safe to do so.  Do not douche, use tampons, or have sex until your health care provider approves. General instructions   To prevent or treat constipation while you are taking prescription pain medicine, your health care provider may recommend that you: ? Drink enough fluid to keep your urine clear or pale yellow. ? Take over-the-counter or prescription medicines. ? Eat foods that are high in fiber, such as fresh fruits and vegetables, whole grains, and beans. ? Limit foods that are high in fat and  processed sugars, such as fried and sweet foods.  Take over-the-counter and prescription medicines only as told by your health care provider.  Do not take baths, swim, or use a hot tub until your health care provider approves. Ask your health care provider if you can take showers. You may only be allowed to take sponge baths for bathing.  Wear compression stockings as told by your health care provider. These stockings help to prevent blood clots and reduce swelling in your legs.  Keep all follow-up visits as told by your health care provider. This is important. Contact a health care provider if:  You have pain when you urinate.  You have pus or a bad smelling discharge coming from your vagina.  You have redness, swelling, or pain around your incision.  You have fluid or blood coming from your incision.  Your incision feels warm to the touch.  You have pus or a bad smell coming from your incision.  You have a fever.  Your incision starts to break open.  You  have pain in the abdomen, and it gets worse or does not get better when you take medicine.  You develop a rash.  You develop nausea and vomiting.  You feel lightheaded. Get help right away if:  You develop pain in your chest or leg.  You become short of breath.  You faint.  You have increased bleeding from your vagina. Summary  After the procedure, it is common to have pain, bleeding in the vagina, and symptoms of menopause.  Follow instructions from your health care provider about how to take care of your incision.  Follow instructions from your health care provider about activities and restrictions.  Check your incision every day for signs of infection and report any symptoms to your health care provider. This information is not intended to replace advice given to you by your health care provider. Make sure you discuss any questions you have with your health care provider. Document Revised: 01/20/2019 Document  Reviewed: 12/21/2016 Elsevier Patient Education  2020 [employer].

## 2020-03-28 NOTE — Transfer of Care (Signed)
Immediate Anesthesia Transfer of Care Note  Patient: Jacqueline Holmes  Procedure(s) Performed: Procedure(s) (LRB): XI ROBOTIC ASSISTED SALPINGO OOPHORECTOMY; (Bilateral)  Patient Location: PACU  Anesthesia Type: General  Level of Consciousness: awake, sedated, patient cooperative and responds to stimulation  Airway & Oxygen Therapy: Patient Spontanous Breathing and Patient connected to [place] 02 and soft FM   Post-op Assessment: Report given to PACU RN, Post -op Vital signs reviewed and stable and Patient moving all extremities  Post vital signs: Reviewed and stable  Complications: No apparent anesthesia complications

## 2020-03-28 NOTE — Anesthesia Procedure Notes (Signed)
Procedure Name: Intubation Date/Time: 03/28/2020 7:33 AM Performed by: Justice Rocher, CRNA Pre-anesthesia Checklist: Patient identified, Emergency Drugs available, Suction available, Patient being monitored and Timeout performed Patient Re-evaluated:Patient Re-evaluated prior to induction Oxygen Delivery Method: Circle system utilized Preoxygenation: Pre-oxygenation with 100% oxygen Induction Type: IV induction Ventilation: Mask ventilation without difficulty Laryngoscope Size: Mac and 3 Grade View: Grade II Tube type: Oral Tube size: 7.5 mm Number of attempts: 1 Airway Equipment and Method: Stylet and Oral airway Placement Confirmation: ETT inserted through vocal cords under direct vision,  positive ETCO2,  breath sounds checked- equal and bilateral and CO2 detector Secured at: 21 cm Tube secured with: Tape Dental Injury: Teeth and Oropharynx as per pre-operative assessment

## 2020-03-28 NOTE — Anesthesia Postprocedure Evaluation (Signed)
Anesthesia Post Note  Patient: Jacqueline Holmes  Procedure(s) Performed: XI ROBOTIC ASSISTED SALPINGO OOPHORECTOMY; (Bilateral Abdomen)     Patient location during evaluation: PACU Anesthesia Type: General Level of consciousness: awake Pain management: pain level controlled Respiratory status: spontaneous breathing Cardiovascular status: stable Postop Assessment: no apparent nausea or vomiting Anesthetic complications: no    Last Vitals:  Vitals:   03/28/20 1045 03/28/20 1115  BP: 130/84 121/84  Pulse: 88 78  Resp: 17 16  Temp:  36.6 C  SpO2: 94% 96%    Last Pain:  Vitals:   03/28/20 1115  TempSrc:   PainSc: 1                  Mililani Murthy

## 2020-03-28 NOTE — Op Note (Signed)
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   Anesthesia: GET  Urine Output: 200 cc  Operative Findings: On EUA, small, mobile retroverted uterus. On intra-abdominal entry, normal upper abdominal survey. Omentum with some filmy adhesions to the right sidewall. Cecum adherent to to the right IP ligament. Sigmoid mesentery adherent to the left sidewall. Right ovary 4cm and multi-cystic with solid components, smooth overall. Left ovary 2-3cm and multicystic. Bilateral fallopian tubes normal in appearance. Uterus 8cm and retroverted, normal. No ascites. No intra-abdominal or pelvic evidence of disease.  Estimated Blood Loss:  less than 50 mL      Total IV Fluids: 1,200 ml         Specimens: bilateral tubes and ovaries, washings         Complications:  None apparent; patient tolerated the procedure well.         Disposition: PACU - hemodynamically stable.  Procedure Details  The patient was seen in the Holding Room. The risks, benefits, complications, treatment options, and expected outcomes were discussed with the patient.  The patient concurred with the proposed plan, giving informed consent.  The site of surgery properly noted/marked. The patient was identified as Jacqueline Holmes and the procedure verified as a Robotic-assisted bilateral salpingo oophorectomy.   After induction of anesthesia, the patient was draped and prepped in the usual sterile manner. Patient was placed in supine position after  anesthesia and draped and prepped in the usual sterile manner as follows: Her arms were tucked to her side with all appropriate precautions.  The shoulders were stabilized with padded shoulder blocks applied to the acromium processes.  The patient was placed in the semi-lithotomy position in [place].  The perineum and vagina were prepped with Betadine. The patient was draped after the CholoraPrep had been allowed to dry for 3 minutes.  A Time Out was held and the above information confirmed.  The urethra was prepped with Betadine. Foley catheter was placed.  A sterile speculum was placed in the vagina.  The cervix was grasped with a single-tooth tenaculum. The cervix was dilated with Kennon Rounds dilators.  The ZUMI uterine manipulator with a medium colpotomizer ring was placed without difficulty.  OG tube placement was confirmed and to suction.   Next, a 10 mm skin incision was made 1 cm below the subcostal margin in the midclavicular line.  The 5 mm Optiview port and scope was used for direct entry.  Opening pressure was under 10 mm CO2.  The abdomen was insufflated and the findings were noted as above.   At this point and all points during the procedure, the patient's intra-abdominal pressure did not exceed 15 mmHg. Next, an 8 mm skin incision was made superior the umbilicus and a right and left port were placed about 8 cm lateral to the robot port on the right and left side.  The 5 mm assist trocar was exchanged for a 10-12 mm port. All ports were placed under direct visualization.  The patient was placed in steep Trendelenburg.  Bowel was folded away into the upper abdomen.  The robot was docked in the normal manner.  Attention was first turned to lysing adhesions of the cecum to the right sidewall and IP ligament as well as the sigmoid mesentery to the left sidewall. The right and left peritoneum were opened parallel to the IP ligament to open the retroperitoneal spaces bilaterally. The round ligament  was transected on the right to facilitate excision of the right adnexa without cyst/mass rupture. The ureter was noted to be on the medial leaf of the broad ligament.  The peritoneum above the ureter was incised and stretched and the infundibulopelvic ligament was skeletonized, cauterized and cut. The fallopian tube and utero-ovarian ligament were cauterized and transected just lateral to the uterus, ultimately freeing the adnexa. The right adnexa was placed in an Endocatch bag and removed piecemeal in a contained manner. The adnexa was then handed off the field and sent to pathology for frozen section. While awaiting pathology the left adnexa was placed in a bag and removed in a contained manner.  The posterior peritoneum was taken down to the level of the KOH ring.  The anterior peritoneum was also taken down.  The bladder flap was created to the level of the KOH ring.  The uterine artery on the right side was skeletonized, cauterized and cut in the normal manner.  A similar procedure was performed on the left.  The colpotomy was made and the uterus, cervix, bilateral ovaries and tubes were amputated and delivered through the vagina.  Pedicles were inspected and excellent hemostasis was achieved.    Once the frozen section back, irrigation was used and excellent hemostasis was achieved.  At this point in the procedure was completed.  Robotic instruments were removed under direct visulaization.  The robot was undocked. The fascia at the 10-12 mm port was closed with 0 Vicryl on a UR-5 needle.  The subcuticular tissue was closed with 4-0 Vicryl and the skin was closed with 4-0 Monocryl in a subcuticular manner.  Dermabond was applied.    The vagina was swabbed with  minimal bleeding noted.   All sponge, lap and needle counts were correct x  3. The foley catheter was removed.  The patient was transferred to the recovery room in stable condition.  Jeral Pinch, MD

## 2020-03-28 NOTE — Interval H&P Note (Signed)
History and Physical Interval Note:  03/28/2020 7:09 AM  Jacqueline Holmes  has presented today for surgery, with the diagnosis of BILATERAL OVARIAN CYST.  The various methods of treatment have been discussed with the patient and family. After consideration of risks, benefits and other options for treatment, the patient has consented to  Procedure(s): XI ROBOTIC ASSISTED SALPINGO OOPHORECTOMY; POSSIBLE STAGING POSSIBLE LAPAROTOMY (Bilateral) as a surgical intervention.  The patient's history has been reviewed, patient examined, no change in status, stable for surgery.  I have reviewed the patient's chart and labs.  Questions were answered to the patient's satisfaction.     Lafonda Mosses

## 2020-03-29 ENCOUNTER — Telehealth: Payer: Self-pay | Admitting: Gynecologic Oncology

## 2020-03-29 ENCOUNTER — Telehealth: Payer: Self-pay

## 2020-03-29 ENCOUNTER — Encounter: Payer: Self-pay | Admitting: Obstetrics and Gynecology

## 2020-03-29 LAB — CYTOLOGY - NON PAP

## 2020-03-29 LAB — SURGICAL PATHOLOGY

## 2020-03-29 NOTE — Telephone Encounter (Signed)
Called patient with final pathology from surgery.  She was happy with this news.  Overall feeling well.  Jeral Pinch MD Gynecologic Oncology

## 2020-03-29 NOTE — Telephone Encounter (Signed)
Jacqueline Holmes states that she is eating, drinking, and urinating well. She is passing gas. Began senokot-s this am. Suggested increasing to 2 tabs bid tomorrow if no BM by mid morning. She can add a capful of Miralax bid on Sunday of no BM. Afebrile. Incisions are D&I. Pain being controlled with Ibuprofen and occasional Oxycodone. She is aware of her post op appointments and the office number [phone] if she has any questions or concerns.

## 2020-04-03 ENCOUNTER — Inpatient Hospital Stay: Payer: BC Managed Care – PPO | Admitting: Gynecologic Oncology

## 2020-04-15 ENCOUNTER — Ambulatory Visit (INDEPENDENT_AMBULATORY_CARE_PROVIDER_SITE_OTHER): Payer: BC Managed Care – PPO

## 2020-04-15 DIAGNOSIS — J309 Allergic rhinitis, unspecified: Secondary | ICD-10-CM | POA: Diagnosis not present

## 2020-04-16 NOTE — Progress Notes (Signed)
Gynecologic Oncology Return Clinic Visit  04/18/20  Reason for Visit: Postop follow-up  Treatment History: Presented with postcoital bleeding. 2/25: Seen in clinic and underwent endometrial biopsy.  Pathology from this revealed scant atrophic- appearing endometrium present in the background of a  majority of endocervical glandular and squamous epithelium. No malignancy identified. Pelvic US: Uterus with fibroids (3). Larges it 0.97 cm. Endometrial lining - 3.29 mm. Bilateral ovaries with complex cysts - right ovarian cyst - 3.8 cm with papillations, largest 20 mm, left ovarian cyst - 3.0 cm with papillations, largest 15 mm. No abnormal vascularization of ovaries. No free fluid. Tumor markers: CA-125: 11.9 CEA: 2 Genetic testing: negative for pathogenic mutations 4/8: pelvic ultrasound shows persistent complex ovarian masses with small mural nodules. 4/15: CT A/P - bilateral ovarian masses, no other findings. 03/28/20: Robotic BSO - benign serous cystadenofibroma of the ovaries  Interval History: Patient reports overall doing well.  She has had some decreased appetite and early satiety but notes that this began prior to surgery.  She reports regular bowel and bladder function.  She denies any changes in hot flashes or other menopausal symptoms and is continued on the same estrogen dose.  She denies any vaginal bleeding or discharge.  She has had some pain on the left side of her abdomen and upper abdomen with some significant bruising initially around the left most port site.  She notes significant improvement in this pain with time.  She notes increased discomfort with movement and describes this pain as a dull pain and feeling like something is "bruised".  She has not required any pain medication for some time.  Past Medical/Surgical History: Past Medical History:  Diagnosis Date  . Abnormal uterine bleeding (AUB)    march/april 2021  . Angio-edema   . Asthma   . Cough    non prod  .  Diabetes mellitus arising in pregnancy    now pre-diabetes  . Family history of breast cancer    MGM  . Family history of pancreatic cancer   . Family history of prostate cancer   . Fatigue   . Generalized headaches    migraines on occasion.  Berenice Primas disease   . Hyperlipidemia   . Hypothyroidism    s/p thyroidectomy for Grave's disease (Dr. Tye Savoy at [facility])  . IBS (irritable bowel syndrome)   . Incontinence   . Migraine    without aura  . Pre-diabetes   . Recurrent upper respiratory infection (URI)    last uri dec 2020  . Sinus problem    runny nose  . Sleep apnea    mild no cpap needed     Past Surgical History:  Procedure Laterality Date  . ABDOMINOPLASTY  12/2015   Dr. Towanda Malkin  . BREAST REDUCTION SURGERY  11/03/2018  . BREAST SURGERY  10/2018   breast reduction  . CESAREAN SECTION     x 1  . DILATATION & CURETTAGE/HYSTEROSCOPY WITH TRUECLEAR N/A 01/16/2014   Procedure: DILATATION & CURETTAGE/HYSTEROSCOPY WITH TRUCLEAR;  Surgeon: Marylynn Pearson, MD;  Location: [facility];  Service: Gynecology;  Laterality: N/A;  . LASIK    . prk laser eye surgery Left 08/2019  . ROBOTIC ASSISTED SALPINGO OOPHERECTOMY Bilateral 03/28/2020   Procedure: XI ROBOTIC ASSISTED SALPINGO OOPHORECTOMY;;  Surgeon: Lafonda Mosses, MD;  Location: [facility];  Service: Gynecology;  Laterality: Bilateral;  . TOTAL THYROIDECTOMY  07/30/11   Dr. Harlow Asa (Grave's disease)  . VULVA /PERINEUM BIOPSY N/A 01/16/2014  Procedure: VULVAR BIOPSY;  Surgeon: Marylynn Pearson, MD;  Location: [facility];  Service: Gynecology;  Laterality: N/A;    Family History  Problem Relation Age of Onset  . Prostate cancer Father        dx. in his 16s  . Allergic rhinitis Mother   . Asthma Mother   . Alpha-1 antitrypsin deficiency Mother   . COPD Mother   . Diabetes Maternal Grandmother   . Pancreatic cancer Maternal Grandmother        dx. in her early 62s  . Heart disease Maternal  Grandfather        CABG in 60's  . Diabetes Maternal Grandfather   . Diabetes Paternal Grandmother   . Diabetes Paternal Grandfather   . Heart disease Paternal Grandfather   . Prostate cancer Paternal Uncle        dx. in his 3s  . Angioedema Neg Hx   . Eczema Neg Hx     Social History   Socioeconomic History  . Marital status: Married    Spouse name: Not on file  . Number of children: 1  . Years of education: Not on file  . Highest education level: Not on file  Occupational History  . 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 Smoking status: Never Smoker  . Smokeless tobacco: Never Used  Substance and Sexual Activity  . Alcohol use: Yes    Comment: once a month  . Drug use: No  . Sexual activity: Yes    Partners: Male    Birth control/protection: None    Comment: postmenopausal (LMP3/2018)  Other Topics Concern  . Not on file  Social History Narrative   Divorced and re-married.  Lives with her husband.  Son is grown and lives in own apartment   Social Determinants of Health   Financial Resource Strain:   . Difficulty of Paying Living Expenses:   Food Insecurity:   . Worried About Charity fundraiser in the Last Year:   . Arboriculturist in the Last Year:   Transportation Needs:   . Film/video editor (Medical):   Marland Kitchen Lack of Transportation (Non-Medical):   Physical Activity:   . Days of Exercise per Week:   . Minutes of Exercise per Session:   Stress:   . Feeling of Stress :   Social Connections:   . Frequency of Communication with Friends and Family:   . Frequency of Social Gatherings with Friends and Family:   . Attends Religious Services:   . Active Member of Clubs or Organizations:   . Attends Archivist Meetings:   Marland Kitchen Marital Status:     Current Medications:  Current Outpatient Medications:  .  acetaminophen (TYLENOL) 500 MG tablet, Take 1,000 mg by  mouth every 6 (six) hours as needed for headache. Reported on 06/17/2016, Disp: , Rfl:  .  ASHWAGANDHA PO, Take 300 mg by mouth 2 (two) times daily., Disp: , Rfl:  .  Barberry-Oreg Grape-Goldenseal (BERBERINE COMPLEX PO), Take by mouth 2 (two) times daily., Disp: , Rfl:  .  budesonide-formoterol (SYMBICORT) 160-4.5 MCG/ACT inhaler, Two puffs with spacer device twice a day during asthma flares.  Rinse, gargle and spit after use., Disp: 1 Inhaler, Rfl: 5 .  cholecalciferol (VITAMIN D) 1000 UNITS tablet, Take 1,000 Units by mouth daily. , Disp: , Rfl:  .  estradiol (ESTRACE) 1 MG tablet, Take 1 tablet by mouth  daily., Disp: , Rfl:  .  Fexofenadine HCl (ALLEGRA PO), Take by mouth., Disp: , Rfl:  .  guaiFENesin (MUCINEX) 600 MG 12 hr tablet, Take 600 mg by mouth 2 (two) times daily., Disp: , Rfl:  .  hydrOXYzine (ATARAX/VISTARIL) 25 MG tablet, as needed. , Disp: , Rfl:  .  ibuprofen (ADVIL) 800 MG tablet, Take 1 tablet (800 mg total) by mouth every 8 (eight) hours as needed for moderate pain. For AFTER surgery, Disp: 30 tablet, Rfl: 0 .  levocetirizine (XYZAL) 5 MG tablet, TAKE 1 TABLET ONCE DAILY IN THE EVENING., Disp: 90 tablet, Rfl: 1 .  levothyroxine (SYNTHROID) 75 MCG tablet, Take 75 mcg by mouth daily., Disp: , Rfl:  .  metFORMIN (GLUCOPHAGE) 1000 MG tablet, Take 1,000 mg by mouth daily., Disp: , Rfl:  .  NON FORMULARY, 306 mg daily. Estrogen Control, Disp: , Rfl:  .  oxyCODONE (OXY IR/ROXICODONE) 5 MG immediate release tablet, Take 1 tablet (5 mg total) by mouth every 4 (four) hours as needed for severe pain. For AFTER surgery, do not take and drive, Disp: 15 tablet, Rfl: 0 .  Pregnenolone POWD, 200 mg by Does not apply route. Powder 2x week, Disp: , Rfl:  .  PROAIR HFA 108 (90 Base) MCG/ACT inhaler, Inhale 2 puffs into the lungs every 4 (four) hours as needed., Disp: 8.5 g, Rfl: 1 .  progesterone (PROMETRIUM) 200 MG capsule, at bedtime. , Disp: , Rfl:  .  QVAR REDIHALER 80 MCG/ACT inhaler, INHALE  2 PUFFS TWICE DAILY, Disp: 10.6 g, Rfl: 4 .  senna-docusate (SENOKOT-S) 8.6-50 MG tablet, Take 2 tablets by mouth at bedtime. For AFTER surgery, do not take if having diarrhea, Disp: 30 tablet, Rfl: 0 .  Spacer/Aero-Holding Chambers (OPTICHAMBER DIAMOND) DEVI, Use as directed with inhaler. Dx:  J45.40 Asthma, Disp: 1 Device, Rfl: 2 .  testosterone (ANDROGEL) 50 MG/5GM (1%) GEL, Place 5 g onto the skin daily., Disp: , Rfl:  .  thyroid (NP THYROID) 90 MG tablet, Take 1 tablet by mouth in the morning and at bedtime. , Disp: , Rfl:   Review of Systems: Denies appetite changes, fevers, chills, fatigue, unexplained weight changes. Denies hearing loss, neck lumps or masses, mouth sores, ringing in ears or voice changes. Denies cough or wheezing.  Denies shortness of breath. Denies chest pain or palpitations. Denies leg swelling. Denies abdominal distention, pain, blood in stools, constipation, diarrhea, nausea, vomiting, or early satiety. Denies pain with intercourse, dysuria, frequency, hematuria or incontinence. Denies hot flashes, pelvic pain, vaginal bleeding or vaginal discharge.   Denies joint pain, back pain or muscle pain/cramps. Denies itching, rash, or wounds. Denies dizziness, headaches, numbness or seizures. Denies swollen lymph nodes or glands, denies easy bruising or bleeding. Denies anxiety, depression, confusion, or decreased concentration.  Physical Exam: BP 130/88 (BP Location: Left Arm, Patient Position: Sitting)   Pulse (!) 105   Temp 98.2 F (36.8 C) (Oral)   Resp 16   Ht 5' 1.5" (1.562 m)   Wt 166 lb 6 oz (75.5 kg)   LMP 01/28/2017   SpO2 98%   BMI 30.93 kg/m  General: Alert, oriented, no acute distress. HEENT: Normocephalic, atraumatic, sclera anicteric. Chest: Unlabored breathing on room air. Abdomen: Obese, soft, mild tenderness to palpation around left mid incision.  Normoactive bowel sounds.  No masses or hepatosplenomegaly appreciated.  Healing laparoscopic  incisions, no erythema, induration, bruising or exudate. Extremities: Grossly normal range of motion.  Warm, well perfused.  No edema bilaterally.  Laboratory & Radiologic Studies: A. OVARY AND FALLOPIAN TUBE, RIGHT, SALPINGO OOPHORECTOMY:  - Benign serous cystadenofibroma  - Segment of benign unremarkable fallopian tube  - No evidence of malignancy   B. OVARY AND FALLOPIAN TUBE, LEFT, SALPINGO OOPHORECTOMY:  - Benign serous cystadenofibroma  - Segment of benign unremarkable fallopian tube  - No evidence of malignancy   Assessment & Plan: Jacqueline Holmes is a 49 y.o. woman with strong family history of gynecologic and breast cancer who had persistent bilateral complex adnexal masses now status post robotic BSO with benign cystadenofibromas.  Overall, the patient is doing well from a postoperative perspective.  We discussed that the left-sided abdominal pain is likely due to bruising or perhaps the development of a small hematoma or seroma postop.  I do not palpate anything on exam today and the patient has had significant improvement in her pain over the last week.  I have asked her to reach out if she develops any new or worsening symptoms.  I suspect that this will improve and completely resolve within than a few weeks.  Patient was given out of printed report of her pathology report.  She has not felt any change in her hot flashes or other menopausal symptoms.  Hormone replacement therapy is managed by another physician and I encouraged her to follow-up with this person at her regular visit in July or August.  The fact that she is not noticed significant change in her symptoms leads me to believe that she will not need to change her dose of estrogen.  The patient will be released back to her GYN for further care but no she can contact my office with any concerns or questions in the future.  15 minutes of total time was spent for this patient encounter, including preparation, face-to-face  counseling with the patient and coordination of care, and documentation of the encounter.  Jeral Pinch, MD  Division of Gynecologic Oncology  Department of Obstetrics and Gynecology  [facility]

## 2020-04-18 ENCOUNTER — Other Ambulatory Visit: Payer: Self-pay

## 2020-04-18 ENCOUNTER — Encounter: Payer: Self-pay | Admitting: Gynecologic Oncology

## 2020-04-18 ENCOUNTER — Inpatient Hospital Stay: Payer: BC Managed Care – PPO | Attending: Gynecologic Oncology | Admitting: Gynecologic Oncology

## 2020-04-18 VITALS — BP 130/88 | HR 105 | Temp 98.2°F | Resp 16 | Ht 61.5 in | Wt 166.4 lb

## 2020-04-18 DIAGNOSIS — N83202 Unspecified ovarian cyst, left side: Secondary | ICD-10-CM

## 2020-04-18 DIAGNOSIS — N83201 Unspecified ovarian cyst, right side: Secondary | ICD-10-CM

## 2020-04-18 NOTE — Patient Instructions (Signed)
You are healing very well from surgery!  Please let me know if you have any further questions or concerns in the future.  Otherwise, plan to continue regular gynecologic care with your gynecologist.

## 2020-04-26 ENCOUNTER — Ambulatory Visit (INDEPENDENT_AMBULATORY_CARE_PROVIDER_SITE_OTHER): Payer: BC Managed Care – PPO

## 2020-04-26 DIAGNOSIS — J309 Allergic rhinitis, unspecified: Secondary | ICD-10-CM | POA: Diagnosis not present

## 2020-05-07 ENCOUNTER — Ambulatory Visit (INDEPENDENT_AMBULATORY_CARE_PROVIDER_SITE_OTHER): Payer: BC Managed Care – PPO

## 2020-05-07 DIAGNOSIS — J309 Allergic rhinitis, unspecified: Secondary | ICD-10-CM | POA: Diagnosis not present

## 2020-05-09 ENCOUNTER — Ambulatory Visit: Payer: Self-pay

## 2020-05-27 ENCOUNTER — Ambulatory Visit (INDEPENDENT_AMBULATORY_CARE_PROVIDER_SITE_OTHER): Payer: BC Managed Care – PPO

## 2020-05-27 DIAGNOSIS — J309 Allergic rhinitis, unspecified: Secondary | ICD-10-CM | POA: Diagnosis not present

## 2020-05-27 NOTE — Progress Notes (Signed)
VIALS EXP 05-27-21

## 2020-05-29 DIAGNOSIS — J301 Allergic rhinitis due to pollen: Secondary | ICD-10-CM | POA: Diagnosis not present

## 2020-05-31 ENCOUNTER — Telehealth: Payer: Self-pay

## 2020-05-31 NOTE — Telephone Encounter (Signed)
OV needed, as these are usually controlled substances (unless she wants to just use OTC benadryl and melatonin), and we need OV.  Can be virtual if easier for patient.

## 2020-05-31 NOTE — Telephone Encounter (Signed)
I got the pt. Scheduled next week for a virtual on 06/06/20

## 2020-05-31 NOTE — Telephone Encounter (Signed)
Pt. Called stating she is going out of the country on 7/12/21and she wanted to know if you could call her in something to help her sleep on the plane or does she need to be seen for an apt with you.

## 2020-06-05 NOTE — Progress Notes (Signed)
Start time: 1:30 End time: 1:49  Virtual Visit via Video Note  I connected with Jacqueline Holmes on 06/06/2020 by a video enabled telemedicine application and verified that I am speaking with the correct person using two identifiers.  Location: Patient: home Provider: office   I discussed the limitations of evaluation and management by telemedicine and the availability of in person appointments. The patient expressed understanding and agreed to proceed.  History of Present Illness:  Chief Complaint  Patient presents with  . Consult    VIRTUAL visit to discuss possible medication to help sleep on plane ride.    Patient is going out of the country on 06/10/20, and is requesting a medication to help her sleep on the plane.  She will be flying to [place]. Previously traveled to [place].  She couldn't sleep on the plane, had jet lag. Benadryl and melatonin wasn't helpful when tried in the past.  PMH, PSH, [place] reviewed  Outpatient Encounter Medications as of 06/06/2020  Medication Sig Note  . ASHWAGANDHA PO Take 300 mg by mouth 2 (two) times daily.   Jolyne Loa Grape-Goldenseal (BERBERINE COMPLEX PO) Take by mouth 2 (two) times daily.   . budesonide-formoterol (SYMBICORT) 160-4.5 MCG/ACT inhaler Two puffs with spacer device twice a day during asthma flares.  Rinse, gargle and spit after use.   . chlorhexidine (PERIDEX) 0.12 % solution 15 mLs 2 (two) times daily.   . cholecalciferol (VITAMIN D) 1000 UNITS tablet Take 1,000 Units by mouth daily.    Marland Kitchen estradiol (ESTRACE) 1 MG tablet Take 1 tablet by mouth daily.   Marland Kitchen Fexofenadine HCl (ALLEGRA PO) Take by mouth.   . levocetirizine (XYZAL) 5 MG tablet TAKE 1 TABLET ONCE DAILY IN THE EVENING.   Marland Kitchen levothyroxine (SYNTHROID) 75 MCG tablet Take 75 mcg by mouth daily.   . metFORMIN (GLUCOPHAGE) 1000 MG tablet Take 1,000 mg by mouth daily. 09/14/2019: Pt resports it is the extended release form 500mg , takes 2 once daily  . NON FORMULARY  306 mg daily. Estrogen Control   . Pregnenolone POWD 200 mg by Does not apply route. Powder 2x week   . progesterone (PROMETRIUM) 100 MG capsule Take 100 mg by mouth at bedtime.   Marland Kitchen QVAR REDIHALER 80 MCG/ACT inhaler INHALE 2 PUFFS TWICE DAILY   . Spacer/Aero-Holding Chambers [facility] DIAMOND) DEVI Use as directed with inhaler. Dx:  J45.40 Asthma   . testosterone (ANDROGEL) 50 MG/5GM (1%) GEL Place 5 g onto the skin daily.   Marland Kitchen thyroid (NP THYROID) 90 MG tablet Take 1 tablet by mouth in the morning and at bedtime.    Marland Kitchen acetaminophen (TYLENOL) 500 MG tablet Take 1,000 mg by mouth every 6 (six) hours as needed for headache. Reported on 06/17/2016 (Patient not taking: Reported on 06/06/2020)   . ALPRAZolam (XANAX) 0.5 MG tablet Take 1/2 to up to 2 tablets by mouth if needed for sleep/anxiety   . guaiFENesin (MUCINEX) 600 MG 12 hr tablet Take 600 mg by mouth 2 (two) times daily. (Patient not taking: Reported on 06/06/2020) 10/06/2019: PRN  . ibuprofen (ADVIL) 800 MG tablet Take 1 tablet (800 mg total) by mouth every 8 (eight) hours as needed for moderate pain. For AFTER surgery (Patient not taking: Reported on 06/06/2020)   . PROAIR HFA 108 (90 Base) MCG/ACT inhaler Inhale 2 puffs into the lungs every 4 (four) hours as needed. (Patient not taking: Reported on 06/06/2020)   . [DISCONTINUED] hydrOXYzine (ATARAX/VISTARIL) 25 MG tablet as needed.    . [  DISCONTINUED] metFORMIN (GLUCOPHAGE-XR) 500 MG 24 hr tablet Take 500 mg by mouth 2 (two) times daily.   . [DISCONTINUED] oxyCODONE (OXY IR/ROXICODONE) 5 MG immediate release tablet Take 1 tablet (5 mg total) by mouth every 4 (four) hours as needed for severe pain. For AFTER surgery, do not take and drive   . [DISCONTINUED] progesterone (PROMETRIUM) 200 MG capsule at bedtime.    . [DISCONTINUED] senna-docusate (SENOKOT-S) 8.6-50 MG tablet Take 2 tablets by mouth at bedtime. For AFTER surgery, do not take if having diarrhea    No facility-administered encounter  medications on file as of 06/06/2020.   (NOT taking alprazolam prior to today's visit)  No Known Allergies  ROS: no depression/anxiety/panic attacks.  Moods are good. She otherwise is feeling good.    Observations/Objective:  Ht 5' 1.5" (1.562 m)   Wt 165 lb (74.8 kg)   LMP 01/28/2017   BMI 30.67 kg/m   Pleasant, well-appearing female, in good spirits. She is alert, oriented, cranial nerves are grossly intact. Normal eye contact, speech, grooming Normal mood, affect.   Assessment and Plan:  Travel advice encounter - requesting meds for sleep on plane. Options discussed included OTC (already failed), alprazolam, Ambien. Risks/SE and how to take reviewed  Insomnia, unspecified type - Plan: ALPRAZolam (XANAX) 0.5 MG tablet     Follow Up Instructions:    I discussed the assessment and treatment plan with the patient. The patient was provided an opportunity to ask questions and all were answered. The patient agreed with the plan and demonstrated an understanding of the instructions.   The patient was advised to call back or seek an in-person evaluation if the symptoms worsen or if the condition fails to improve as anticipated.  I provided 19 minutes of video face-to-face time during this encounter. Additional time was spent in chart review and documentation.   Vikki Ports, MD

## 2020-06-06 ENCOUNTER — Encounter: Payer: Self-pay | Admitting: Family Medicine

## 2020-06-06 ENCOUNTER — Telehealth (INDEPENDENT_AMBULATORY_CARE_PROVIDER_SITE_OTHER): Payer: BC Managed Care – PPO | Admitting: Family Medicine

## 2020-06-06 ENCOUNTER — Other Ambulatory Visit: Payer: Self-pay

## 2020-06-06 VITALS — Ht 61.5 in | Wt 165.0 lb

## 2020-06-06 DIAGNOSIS — G4700 Insomnia, unspecified: Secondary | ICD-10-CM

## 2020-06-06 DIAGNOSIS — Z7184 Encounter for health counseling related to travel: Secondary | ICD-10-CM | POA: Diagnosis not present

## 2020-06-06 MED ORDER — ALPRAZOLAM 0.5 MG PO TABS: ORAL_TABLET | ORAL | 0 refills | Status: DC

## 2020-06-06 NOTE — Patient Instructions (Addendum)
We are prescribing alprazolam to use to help you sleep on the plane. Some people are very sensitive, and 1/2 tablet helps (0.25mg ). For others, 0.5mg  causes some light sedation and allows people to get a good rest. If you take a full tablet (0.5mg ), and after 30-60 minutes aren't sleepy, you can take an additional 1/2 tablet.  You should not mix this with alcohol.  I think that taking Melatonin at bedtime during the trip will help with jetlag (even if it doesn't make you very sleepy).  Have a great time!  And HAPPY BIRTHDAY!!!

## 2020-06-26 DIAGNOSIS — Z8616 Personal history of COVID-19: Secondary | ICD-10-CM

## 2020-06-26 HISTORY — DX: Personal history of COVID-19: Z86.16

## 2020-06-30 HISTORY — PX: OTHER SURGICAL HISTORY: SHX169

## 2020-07-24 ENCOUNTER — Other Ambulatory Visit: Payer: Self-pay

## 2020-07-24 DIAGNOSIS — H1013 Acute atopic conjunctivitis, bilateral: Secondary | ICD-10-CM

## 2020-07-24 DIAGNOSIS — J3089 Other allergic rhinitis: Secondary | ICD-10-CM

## 2020-07-24 MED ORDER — LEVOCETIRIZINE DIHYDROCHLORIDE 5 MG PO TABS: ORAL_TABLET | ORAL | 0 refills | Status: DC

## 2020-07-25 ENCOUNTER — Other Ambulatory Visit: Payer: Self-pay

## 2020-07-25 ENCOUNTER — Ambulatory Visit (INDEPENDENT_AMBULATORY_CARE_PROVIDER_SITE_OTHER): Payer: BC Managed Care – PPO

## 2020-07-25 DIAGNOSIS — J309 Allergic rhinitis, unspecified: Secondary | ICD-10-CM

## 2020-07-25 DIAGNOSIS — J3089 Other allergic rhinitis: Secondary | ICD-10-CM

## 2020-07-25 DIAGNOSIS — H1013 Acute atopic conjunctivitis, bilateral: Secondary | ICD-10-CM

## 2020-07-25 MED ORDER — LEVOCETIRIZINE DIHYDROCHLORIDE 5 MG PO TABS: 5.0000 mg | ORAL_TABLET | Freq: Every evening | ORAL | 0 refills | Status: DC

## 2020-07-30 NOTE — Progress Notes (Signed)
Chief Complaint  Patient presents with  . Consult    would like to discuss thyroid and see you for her thyroid issues now. Was seeing Dr. Tye Savoy.     Patient has been under the care of Dr. Tye Savoy at [place], who treats her hypothyroidism, postmenopausal symptoms with estrogen replacement, and pre-diabetes with metformin. We have no records from their office (notes or lab results)  Today she is requesting to switch her thyroid and pre-diabetes care here (will get hormones from her GYN).  She reports their clinic is too far away, and she wasn't comfortable going there--they were not wearing masks (and apparently one of the doctors posted something about ivermectin use).  She reports that her last thyroid check was in March. She has been on NP thyroid for 1.5-2 years (prior to that was on Naturthroid, but can no longer get). She tried Armor thyroid, but liked the NP thyroid better. (NP thyroid--porcine--has T3 and T4 in a 1:4 ratio)  She states that levothyroxine was added after her last labs. Hasn't had labs checked since adding the med, she is due now. Denies any symptoms. Maybe just some palpitations in the past week Always has hard time losing weight. No changes to hair/skin/nails/bowels/energy/moods  Immunization History  Administered Date(s) Administered  . Influenza Inj Mdck Quad Pf 10/29/2017  . Influenza,inj,Quad PF,6+ Mos 08/30/2018, 09/14/2019  . Influenza-Unspecified 09/13/2016  . PFIZER SARS-COV-2 Vaccination 02/02/2020, 03/01/2020  . Pneumococcal Polysaccharide-23 05/16/2018  . Tdap 05/16/2018   PMH, PSH, SH reviewed  Outpatient Encounter Medications as of 07/31/2020  Medication Sig Note  . ASHWAGANDHA PO Take 300 mg by mouth 2 (two) times daily.   Jolyne Loa Grape-Goldenseal (BERBERINE COMPLEX PO) Take by mouth 2 (two) times daily.   . cholecalciferol (VITAMIN D) 1000 UNITS tablet Take 1,000 Units by mouth daily.    Marland Kitchen estradiol (ESTRACE) 1  MG tablet Take 1 tablet by mouth daily.   Marland Kitchen Fexofenadine HCl (ALLEGRA PO) Take by mouth.   . levocetirizine (XYZAL) 5 MG tablet Take 1 tablet (5 mg total) by mouth every evening. Pt will need office visit in order to get more refills.   . NON FORMULARY 306 mg daily. Estrogen Control   . Pregnenolone POWD 200 mg by Does not apply route. Powder 2x week   . progesterone (PROMETRIUM) 100 MG capsule Take 100 mg by mouth at bedtime.   Marland Kitchen QVAR REDIHALER 80 MCG/ACT inhaler INHALE 2 PUFFS TWICE DAILY   . Spacer/Aero-Holding Chambers [facility] DIAMOND) DEVI Use as directed with inhaler. Dx:  J45.40 Asthma   . thyroid (NP THYROID) 90 MG tablet Take 1 tablet (90 mg total) by mouth in the morning and at bedtime.   . [DISCONTINUED] levothyroxine (SYNTHROID) 75 MCG tablet Take 75 mcg by mouth daily. 07/31/2020: Taking generic  . [DISCONTINUED] metFORMIN (GLUCOPHAGE) 1000 MG tablet Take 1,000 mg by mouth daily. 09/14/2019: Pt resports it is the extended release form 500mg , takes 2 once daily  . [DISCONTINUED] thyroid (NP THYROID) 90 MG tablet Take 1 tablet by mouth in the morning and at bedtime.    Marland Kitchen acetaminophen (TYLENOL) 500 MG tablet Take 1,000 mg by mouth every 6 (six) hours as needed for headache. Reported on 06/17/2016 (Patient not taking: Reported on 06/06/2020)   . ALPRAZolam (XANAX) 0.5 MG tablet Take 1/2 to up to 2 tablets by mouth if needed for sleep/anxiety (Patient not taking: Reported on 07/31/2020)   . budesonide-formoterol (SYMBICORT) 160-4.5 MCG/ACT inhaler Two puffs with spacer  device twice a day during asthma flares.  Rinse, gargle and spit after use. (Patient not taking: Reported on 07/31/2020)   . guaiFENesin (MUCINEX) 600 MG 12 hr tablet Take 600 mg by mouth 2 (two) times daily. (Patient not taking: Reported on 06/06/2020) 10/06/2019: PRN  . ibuprofen (ADVIL) 800 MG tablet Take 1 tablet (800 mg total) by mouth every 8 (eight) hours as needed for moderate pain. For AFTER surgery (Patient not taking:  Reported on 06/06/2020)   . levothyroxine (SYNTHROID) 25 MCG tablet Take 1 tablet (25 mcg total) by mouth daily before breakfast.   . metFORMIN (GLUCOPHAGE-XR) 500 MG 24 hr tablet Take 2 tablets (1,000 mg total) by mouth daily with breakfast.   . PROAIR HFA 108 (90 Base) MCG/ACT inhaler Inhale 2 puffs into the lungs every 4 (four) hours as needed. (Patient not taking: Reported on 06/06/2020)   . [DISCONTINUED] chlorhexidine (PERIDEX) 0.12 % solution 15 mLs 2 (two) times daily.   . [DISCONTINUED] metFORMIN (GLUCOPHAGE-XR) 500 MG 24 hr tablet Take 500 mg by mouth 2 (two) times daily.   . [DISCONTINUED] testosterone (ANDROGEL) 50 MG/5GM (1%) GEL Place 5 g onto the skin daily.    No facility-administered encounter medications on file as of 07/31/2020.   (was taking 36mcg of levothyroxine prior to today's visit, not 71mcg)  No Known Allergies  ROS: no fever, chills, URI symptoms, headaches, dizziness, chest pain, shortness of breath.  No GI or GU complaints. No thyroid symptoms.  Recently has had some palpitations.  No edema, bleeding, bruising, rash. Moods are good.   PHYSICAL EXAM:  BP 138/86   Pulse 84   Ht 5' 1.5" (1.562 m)   Wt 169 lb (76.7 kg)   LMP 01/28/2017   BMI 31.42 kg/m   Wt Readings from Last 3 Encounters:  07/31/20 169 lb (76.7 kg)  06/06/20 165 lb (74.8 kg)  04/18/20 166 lb 6 oz (75.5 kg)   Well developed, pleasant female, in good spirits, in no distress HEENT: conjunctiva and sclera are clear, EOMI, wearing mask Neck: no lymphadenopathy or mass.  WHSS lower anterior neck Heart: regular rate and rhythm, no murmur or ectopy Lungs: clear bilaterally Back:  No spinal or CVA tenderness Abdomen: soft, nontender, no mass Extremities: no edema Psych: normal mood, affect, hygiene and grooming Neuro: alert and oriented, normal gait.   ASSESSMENT/PLAN:  Hypothyroidism, unspecified type - May need medications adjusted--will wait on the results. Will request records from [facility]: TSH, T4, Free, T3, Free  Prediabetes - Plan: Hemoglobin A1c, metFORMIN (GLUCOPHAGE-XR) 500 MG 24 hr tablet  Declined flu shot due to timing with her allergy shots, will get another time.  TSH, free T4 and Free T3 A1c

## 2020-07-31 ENCOUNTER — Encounter: Payer: Self-pay | Admitting: Family Medicine

## 2020-07-31 ENCOUNTER — Other Ambulatory Visit: Payer: Self-pay

## 2020-07-31 ENCOUNTER — Ambulatory Visit (INDEPENDENT_AMBULATORY_CARE_PROVIDER_SITE_OTHER): Payer: BC Managed Care – PPO | Admitting: Family Medicine

## 2020-07-31 VITALS — BP 138/86 | HR 84 | Ht 61.5 in | Wt 169.0 lb

## 2020-07-31 DIAGNOSIS — E039 Hypothyroidism, unspecified: Secondary | ICD-10-CM | POA: Diagnosis not present

## 2020-07-31 DIAGNOSIS — R7303 Prediabetes: Secondary | ICD-10-CM | POA: Diagnosis not present

## 2020-07-31 MED ORDER — METFORMIN HCL ER 500 MG PO TB24: 1000.0000 mg | ORAL_TABLET | Freq: Every day | ORAL | 0 refills | Status: DC

## 2020-08-01 ENCOUNTER — Other Ambulatory Visit: Payer: Self-pay | Admitting: *Deleted

## 2020-08-01 DIAGNOSIS — E039 Hypothyroidism, unspecified: Secondary | ICD-10-CM

## 2020-08-01 LAB — HEMOGLOBIN A1C
Est. average glucose Bld gHb Est-mCnc: 128 mg/dL
Hgb A1c MFr Bld: 6.1 % — ABNORMAL HIGH (ref 4.8–5.6)

## 2020-08-01 LAB — T4, FREE: Free T4: 1.58 ng/dL (ref 0.82–1.77)

## 2020-08-01 LAB — T3, FREE: T3, Free: 4.1 pg/mL (ref 2.0–4.4)

## 2020-08-01 LAB — TSH: TSH: <0.005 u[IU]/mL — ABNORMAL LOW (ref 0.450–4.500)

## 2020-08-01 MED ORDER — LEVOTHYROXINE SODIUM 25 MCG PO TABS: 25.0000 ug | ORAL_TABLET | Freq: Every day | ORAL | 1 refills | Status: DC

## 2020-08-01 MED ORDER — THYROID 90 MG PO TABS: 90.0000 mg | ORAL_TABLET | Freq: Two times a day (BID) | ORAL | 1 refills | Status: DC

## 2020-08-02 ENCOUNTER — Ambulatory Visit (INDEPENDENT_AMBULATORY_CARE_PROVIDER_SITE_OTHER): Payer: BC Managed Care – PPO

## 2020-08-02 DIAGNOSIS — J309 Allergic rhinitis, unspecified: Secondary | ICD-10-CM | POA: Diagnosis not present

## 2020-08-12 ENCOUNTER — Ambulatory Visit (INDEPENDENT_AMBULATORY_CARE_PROVIDER_SITE_OTHER): Payer: BC Managed Care – PPO

## 2020-08-12 DIAGNOSIS — J309 Allergic rhinitis, unspecified: Secondary | ICD-10-CM

## 2020-08-22 NOTE — Progress Notes (Signed)
[place] [address] Dept: [phone]  FOLLOW UP NOTE  Patient ID: Jacqueline Holmes, female    DOB: 06/26/1971  Age: 49 y.o. MRN: [id number] Date of Office Visit: 08/23/2020  Assessment  Chief Complaint: Asthma (No concerns at this time. Covid in July end of the month)  HPI Jacqueline Holmes is a 49 year old female who presents in the clinic for follow-up visit.  She was last seen in this clinic on 9 02/16/2020 by Gareth Morgan, FNP, for evaluation of rhinitis, and allergic conjunctivitis.  In the interim, she reports that she had Covid in July with symptoms including shortness of breath and cough for which she began her asthma action plan and started Symbicort.  She reports that the symptoms have largely resolved at this time.  At today's visit, she reports asthma has been not back to normal but not horrible with symptoms including shortness of breath with vigorous activity and cough which she reports has been.  She denies shortness of breath, cough, and wheeze with she continues Qvar 80-2 puffs twice a day and has not used albuterol since July.  She is interested in in moving forward with lab work for alpha-1 antitrypsin as her mother does have alpha 1 antitrypsin deficiency.  Allergic rhinitis is reported as moderately well controlled with nasal congestion occurring daily.  She continues Xyzal 5 mg once a day and is not currently using nasal sprays or nasal saline rinses as she reports these milligrams as well.  She continues allergen immunotherapy with no large local reactions.  She reports a significant decrease in her symptoms of allergic rhinitis and allergic conjunctivitis while continuing on allergen immunotherapy.  Allergic conjunctivitis is reported as moderately well controlled with occasional ocular pruritus for which she uses olopatadine with relief of symptoms.  Her current medications are listed in the chart.   Drug Allergies:  No Known Allergies  Physical Exam: BP  112/82   Pulse 89   Temp 98.2 F (36.8 C)   Resp 16   Ht 5\' 1"  (1.549 m)   Wt 171 lb 3.2 oz (77.7 kg)   LMP 01/28/2017   SpO2 97%   BMI 32.35 kg/m    Physical Exam Vitals reviewed.  Constitutional:      Appearance: Normal appearance.  HENT:     Head: Normocephalic and atraumatic.     Right Ear: Tympanic membrane normal.     Left Ear: Tympanic membrane normal.     Nose:     Comments: Bilateral nares slightly erythematous with clear nasal drainage noted.  Pharynx normal.  Ears normal.  Eyes normal.    Mouth/Throat:     Pharynx: Oropharynx is clear.  Eyes:     Conjunctiva/sclera: Conjunctivae normal.  Cardiovascular:     Rate and Rhythm: Normal rate and regular rhythm.     Heart sounds: Normal heart sounds. No murmur heard.   Pulmonary:     Effort: Pulmonary effort is normal.     Breath sounds: Normal breath sounds.     Comments: Lungs clear to auscultation Musculoskeletal:        General: Normal range of motion.     Cervical back: Normal range of motion and neck supple.  Skin:    General: Skin is warm and dry.  Neurological:     Mental Status: She is alert and oriented to person, place, and time.  Psychiatric:        Mood and Affect: Mood normal.  Behavior: Behavior normal.        Thought Content: Thought content normal.        Judgment: Judgment normal.     Diagnostics: FVC 2.83, FEV1 2.07.  Predicted FVC 3.28, predicted FEV1 2.53.  Spirometry indicates normal ventilatory function.  Assessment and Plan: 1. Moderate persistent asthma without complication   2. Seasonal and perennial allergic rhinitis   3. Allergic conjunctivitis of both eyes   4. Allergic rhinitis     Meds ordered this encounter  Medications  . budesonide-formoterol (SYMBICORT) 160-4.5 MCG/ACT inhaler    Sig: Two puffs with spacer device twice a day during asthma flares.  Rinse, gargle and spit after use.    Dispense:  10.2 g    Refill:  5  . EPINEPHrine (AUVI-Q) 0.3 mg/0.3 mL IJ  SOAJ injection    Sig: Inject 0.3 mg into the muscle once for 1 dose. As directed for life-threatening allergic reactions    Dispense:  2 each    Refill:  2  . levocetirizine (XYZAL) 5 MG tablet    Sig: Take 1 tablet (5 mg total) by mouth every evening. Pt will need office visit in order to get more refills.    Dispense:  90 tablet    Refill:  1  . PROAIR HFA 108 (90 Base) MCG/ACT inhaler    Sig: Inhale 2 puffs into the lungs every 4 (four) hours as needed.    Dispense:  8.5 g    Refill:  1    Patient Instructions  Asthma Continue Qvar 80-2 puffs twice a day with a spacer to prevent cough or wheeze Continue albuterol 2 puffs every 4 hours with a spacer as needed for cough or wheeze You may use albuterol 2 puffs 5-15 minutes before activity to decrease cough or wheeze For asthma flares, begin Symbicort 160-2 puffs twice a day with a spacer for 2 weeks or until back to breathing baseline. Stop Qvar while using Symbicort 160 We will draw the alpha 1 antitrypsin lab today. We will call you with results as soon as they become available  Allergic rhinitis Continue Xyzal 5 mg once a day as needed for a runny nose Continue Nasacort 1-2 sprays in each nostril once a day for a stuffy nose Continue azelastine nasal spray 2 sprays in each nostril once a day as needed for a runny nose For thick post nasal drainage, begin Mucinex [phone] mg twice a dayConsider saline nasal rinses as needed for nasal symptoms. Use this before any medicated nasal sprays for best result Continue allergy injections and have access to an epinephrine auto-injector set  Allergic conjunctivitis Continue Pazeo eye drops one drop in each eye once a day as needed for red, itchy eyes. Some over the counter options include Zaditor 1 drop in each eye twice a day as needed for red, itchy eyes OR Opcon-A 2 drops in each eye up to 4 times a day as needed for red, itchy eyes  Call the clinic if this treatment plan is not working  well for you  Follow up in 6 months or sooner if needed.    Return in about 6 months (around 02/20/2021), or if symptoms worsen or fail to improve.    Thank you for the opportunity to care for this patient.  Please do not hesitate to contact me with questions.  Gareth Morgan, FNP [facility]

## 2020-08-22 NOTE — Patient Instructions (Addendum)
Asthma Continue Qvar 80-2 puffs twice a day with a spacer to prevent cough or wheeze Continue albuterol 2 puffs every 4 hours with a spacer as needed for cough or wheeze You may use albuterol 2 puffs 5-15 minutes before activity to decrease cough or wheeze For asthma flares, begin Symbicort 160-2 puffs twice a day with a spacer for 2 weeks or until back to breathing baseline. Stop Qvar while using Symbicort 160 We will draw the alpha 1 antitrypsin lab today. We will call you with results as soon as they become available  Allergic rhinitis Continue Xyzal 5 mg once a day as needed for a runny nose Continue Nasacort 1-2 sprays in each nostril once a day for a stuffy nose Continue azelastine nasal spray 2 sprays in each nostril once a day as needed for a runny nose For thick post nasal drainage, begin Mucinex [phone] mg twice a dayConsider saline nasal rinses as needed for nasal symptoms. Use this before any medicated nasal sprays for best result Continue allergy injections and have access to an epinephrine auto-injector set  Allergic conjunctivitis Continue Pazeo eye drops one drop in each eye once a day as needed for red, itchy eyes. Some over the counter options include Zaditor 1 drop in each eye twice a day as needed for red, itchy eyes OR Opcon-A 2 drops in each eye up to 4 times a day as needed for red, itchy eyes  Call the clinic if this treatment plan is not working well for you  Follow up in 6 months or sooner if needed.

## 2020-08-23 ENCOUNTER — Encounter: Payer: Self-pay | Admitting: Family Medicine

## 2020-08-23 ENCOUNTER — Other Ambulatory Visit: Payer: Self-pay | Admitting: *Deleted

## 2020-08-23 ENCOUNTER — Other Ambulatory Visit: Payer: Self-pay

## 2020-08-23 ENCOUNTER — Ambulatory Visit: Payer: BC Managed Care – PPO | Admitting: Family Medicine

## 2020-08-23 ENCOUNTER — Ambulatory Visit: Payer: Self-pay

## 2020-08-23 VITALS — BP 112/82 | HR 89 | Temp 98.2°F | Resp 16 | Ht 61.0 in | Wt 171.2 lb

## 2020-08-23 DIAGNOSIS — J4540 Moderate persistent asthma, uncomplicated: Secondary | ICD-10-CM | POA: Diagnosis not present

## 2020-08-23 DIAGNOSIS — J302 Other seasonal allergic rhinitis: Secondary | ICD-10-CM

## 2020-08-23 DIAGNOSIS — J309 Allergic rhinitis, unspecified: Secondary | ICD-10-CM

## 2020-08-23 DIAGNOSIS — H1013 Acute atopic conjunctivitis, bilateral: Secondary | ICD-10-CM

## 2020-08-23 DIAGNOSIS — J3089 Other allergic rhinitis: Secondary | ICD-10-CM

## 2020-08-23 MED ORDER — PROAIR HFA 108 (90 BASE) MCG/ACT IN AERS
2.0000 | INHALATION_SPRAY | RESPIRATORY_TRACT | 1 refills | Status: DC | PRN
Start: 2020-08-23 — End: 2021-10-14

## 2020-08-23 MED ORDER — BUDESONIDE-FORMOTEROL FUMARATE 160-4.5 MCG/ACT IN AERO: INHALATION_SPRAY | RESPIRATORY_TRACT | 5 refills | Status: DC

## 2020-08-23 MED ORDER — ALBUTEROL SULFATE HFA 108 (90 BASE) MCG/ACT IN AERS: 2.0000 | INHALATION_SPRAY | Freq: Four times a day (QID) | RESPIRATORY_TRACT | 2 refills | Status: DC | PRN

## 2020-08-23 MED ORDER — EPINEPHRINE 0.3 MG/0.3ML IJ SOAJ: 0.3000 mg | Freq: Once | INTRAMUSCULAR | 2 refills | Status: AC

## 2020-08-23 MED ORDER — LEVOCETIRIZINE DIHYDROCHLORIDE 5 MG PO TABS: 5.0000 mg | ORAL_TABLET | Freq: Every evening | ORAL | 1 refills | Status: DC

## 2020-09-03 ENCOUNTER — Encounter: Payer: Self-pay | Admitting: Family Medicine

## 2020-09-03 LAB — ALPHA-1-ANTITRYPSIN DEFICIENCY

## 2020-09-04 ENCOUNTER — Ambulatory Visit (INDEPENDENT_AMBULATORY_CARE_PROVIDER_SITE_OTHER): Payer: BC Managed Care – PPO | Admitting: *Deleted

## 2020-09-04 DIAGNOSIS — J309 Allergic rhinitis, unspecified: Secondary | ICD-10-CM

## 2020-09-04 NOTE — Progress Notes (Signed)
Can you please let this patient know her alpha 1 antitrypsin testing was negative. Thank you

## 2020-09-05 ENCOUNTER — Encounter: Payer: Self-pay | Admitting: Family Medicine

## 2020-09-10 ENCOUNTER — Ambulatory Visit: Payer: BC Managed Care – PPO | Admitting: Podiatry

## 2020-09-10 ENCOUNTER — Encounter: Payer: Self-pay | Admitting: Podiatry

## 2020-09-10 ENCOUNTER — Ambulatory Visit (INDEPENDENT_AMBULATORY_CARE_PROVIDER_SITE_OTHER): Payer: BC Managed Care – PPO

## 2020-09-10 ENCOUNTER — Other Ambulatory Visit: Payer: Self-pay

## 2020-09-10 ENCOUNTER — Encounter: Payer: Self-pay | Admitting: Obstetrics and Gynecology

## 2020-09-10 ENCOUNTER — Ambulatory Visit: Payer: BC Managed Care – PPO | Admitting: Obstetrics and Gynecology

## 2020-09-10 VITALS — BP 142/82 | HR 115 | Ht 61.0 in | Wt 170.0 lb

## 2020-09-10 DIAGNOSIS — Z01419 Encounter for gynecological examination (general) (routine) without abnormal findings: Secondary | ICD-10-CM | POA: Diagnosis not present

## 2020-09-10 DIAGNOSIS — Z1211 Encounter for screening for malignant neoplasm of colon: Secondary | ICD-10-CM

## 2020-09-10 DIAGNOSIS — M7730 Calcaneal spur, unspecified foot: Secondary | ICD-10-CM

## 2020-09-10 DIAGNOSIS — M79671 Pain in right foot: Secondary | ICD-10-CM

## 2020-09-10 DIAGNOSIS — M79672 Pain in left foot: Secondary | ICD-10-CM

## 2020-09-10 DIAGNOSIS — J309 Allergic rhinitis, unspecified: Secondary | ICD-10-CM

## 2020-09-10 DIAGNOSIS — L600 Ingrowing nail: Secondary | ICD-10-CM

## 2020-09-10 DIAGNOSIS — M722 Plantar fascial fibromatosis: Secondary | ICD-10-CM

## 2020-09-10 MED ORDER — MELOXICAM 15 MG PO TABS: 15.0000 mg | ORAL_TABLET | Freq: Every day | ORAL | 3 refills | Status: DC

## 2020-09-10 MED ORDER — ESTRADIOL 0.5 MG PO TABS
0.5000 mg | ORAL_TABLET | Freq: Every day | ORAL | 3 refills | Status: DC
Start: 2020-09-10 — End: 2021-10-02

## 2020-09-10 MED ORDER — PROGESTERONE MICRONIZED 100 MG PO CAPS
100.0000 mg | ORAL_CAPSULE | Freq: Every day | ORAL | 3 refills | Status: DC
Start: 2020-09-10 — End: 2021-10-02

## 2020-09-10 NOTE — Progress Notes (Signed)
49 y.o. G2P1 Married Caucasian female here for annual exam.    Wants Dr.Silva to be the provider to prescribe her HRT. She received Rx from [facility]. She is taking Estrace 1 mg and Prometrium 100 mg nightly.  No hot flashes and would like to continue.  Has been taking for about 3 years, to treat hot flashes.  Wants to try to reduce dosage.  No more vaginal bleeding.   Had Covid infection end of July/beginning of August.  Had [employer] vaccine, completed her second dose in mid or early April.  She will do her flu vaccine next week.   She discussed her cardiac exam with her PCP.  No murmur was detected per patient.   PCP:  [name], MD  Patient's last menstrual period was 01/28/2017.           Sexually active: Yes.    The current method of family planning is post menopausal status.    Exercising: No.  The patient does not participate in regular exercise at present. Smoker:  no  Health Maintenance: Pap: 01-22-20 Neg:Neg HR HPV, 09-02-18 Neg:Neg HR HPV, 05/2017 normal per patient History of abnormal Pap:  Yes, ~2005 patient thinks she had a biopsy. MMG: 03-19-20 Lt.Br.Diag./Neg/density B/BiRads 1/rec.6 month f/u--is scheduled per patient with [facility] in early November for bilateral diagnostic.  Colonoscopy:  NEVER BMD: n/a  Result  n/a TDaP:  04/2018 Gardasil:   no HWE:[id number] Hep C:01-27-18 Neg Screening Labs:  PCP.    reports that she has never smoked. She has never used smokeless tobacco. She reports current alcohol use. She reports that she does not use drugs.  Past Medical History:  Diagnosis Date  . Abnormal uterine bleeding (AUB)    march/april 2021  . Angio-edema   . Asthma   . Cough    non prod  . Diabetes mellitus arising in pregnancy    now pre-diabetes  . Family history of breast cancer    MGM  . Family history of pancreatic cancer   . Family history of prostate cancer   . Fatigue   . Generalized headaches    migraines on occasion.  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 History of COVID-19 06/26/2020  . Hyperlipidemia   . Hypothyroidism    s/p thyroidectomy for Grave's disease (Dr. Tye Savoy at [facility])  . IBS (irritable bowel syndrome)   . Incontinence   . Infertility, female   . Migraine    without aura  . Plantar fasciitis, bilateral 2021  . Pre-diabetes   . Recurrent upper respiratory infection (URI)    last uri dec 2020  . Sinus problem    runny nose  . Sleep apnea    mild no cpap needed   . Uterine polyp     Past Surgical History:  Procedure Laterality Date  . ABDOMINOPLASTY  12/2015   Dr. Towanda Malkin  . BREAST REDUCTION SURGERY  11/03/2018  . BREAST SURGERY  10/2018   breast reduction  . CESAREAN SECTION     x 1  . DILATATION & CURETTAGE/HYSTEROSCOPY WITH TRUECLEAR N/A 01/16/2014   Procedure: DILATATION & CURETTAGE/HYSTEROSCOPY WITH TRUCLEAR;  Surgeon: Marylynn Pearson, MD;  Location: [facility];  Service: Gynecology;  Laterality: N/A;  . LASIK    . prk laser eye surgery Left 08/2019  . ROBOTIC ASSISTED SALPINGO OOPHERECTOMY Bilateral 03/28/2020   Procedure: XI ROBOTIC ASSISTED SALPINGO OOPHORECTOMY;;  Surgeon: Lafonda Mosses, MD;  Location: [facility];  Service: Gynecology;  Laterality: Bilateral;  .  skin cancer remove  06/2020  . TOTAL THYROIDECTOMY  07/30/11   Dr. Harlow Asa (Grave's disease)  . VULVA Milagros Loll BIOPSY N/A 01/16/2014   Procedure: VULVAR BIOPSY;  Surgeon: Marylynn Pearson, MD;  Location: [facility];  Service: Gynecology;  Laterality: N/A;    Current Outpatient Medications  Medication Sig Dispense Refill  . acetaminophen (TYLENOL) 500 MG tablet Take 1,000 mg by mouth every 6 (six) hours as needed for headache. Reported on 06/17/2016    . albuterol (VENTOLIN HFA) 108 (90 Base) MCG/ACT inhaler Inhale 2 puffs into the lungs every 6 (six) hours as needed for wheezing or shortness of breath. 18 g 2  . ALPRAZolam (XANAX) 0.5 MG tablet Take 1/2 to up to 2 tablets by mouth if needed for  sleep/anxiety 10 tablet 0  . budesonide-formoterol (SYMBICORT) 160-4.5 MCG/ACT inhaler Two puffs with spacer device twice a day during asthma flares.  Rinse, gargle and spit after use. 10.2 g 5  . cholecalciferol (VITAMIN D) 1000 UNITS tablet Take 1,000 Units by mouth daily.     Marland Kitchen estradiol (ESTRACE) 1 MG tablet Take 1 tablet by mouth daily.    Marland Kitchen Fexofenadine HCl (ALLEGRA PO) Take by mouth.    Marland Kitchen guaiFENesin (MUCINEX) 600 MG 12 hr tablet Take 600 mg by mouth 2 (two) times daily.     Marland Kitchen ibuprofen (ADVIL) 800 MG tablet Take 1 tablet (800 mg total) by mouth every 8 (eight) hours as needed for moderate pain. For AFTER surgery 30 tablet 0  . levocetirizine (XYZAL) 5 MG tablet Take 1 tablet (5 mg total) by mouth every evening. Pt will need office visit in order to get more refills. 90 tablet 1  . levothyroxine (SYNTHROID) 25 MCG tablet Take 1 tablet (25 mcg total) by mouth daily before breakfast. 30 tablet 1  . meloxicam (MOBIC) 15 MG tablet Take 1 tablet (15 mg total) by mouth daily. 30 tablet 3  . metFORMIN (GLUCOPHAGE-XR) 500 MG 24 hr tablet Take 2 tablets (1,000 mg total) by mouth daily with breakfast. 1 tablet 0  . PROAIR HFA 108 (90 Base) MCG/ACT inhaler Inhale 2 puffs into the lungs every 4 (four) hours as needed. 8.5 g 1  . progesterone (PROMETRIUM) 100 MG capsule Take 100 mg by mouth at bedtime.    Marland Kitchen QVAR REDIHALER 80 MCG/ACT inhaler INHALE 2 PUFFS TWICE DAILY 10.6 g 4  . Spacer/Aero-Holding Chambers [facility] DIAMOND) DEVI Use as directed with inhaler. Dx:  J45.40 Asthma 1 Device 2  . thyroid (NP THYROID) 90 MG tablet Take 1 tablet (90 mg total) by mouth in the morning and at bedtime. 60 tablet 1   No current facility-administered medications for this visit.    Family History  Problem Relation Age of Onset  . Prostate cancer Father        dx. in his 110s  . Allergic rhinitis Mother   . Asthma Mother   . Alpha-1 antitrypsin deficiency Mother   . COPD Mother   . Diabetes Maternal  Grandmother   . Pancreatic cancer Maternal Grandmother        dx. in her early 10s  . Heart disease Maternal Grandfather        CABG in 60's  . Diabetes Maternal Grandfather   . Diabetes Paternal Grandmother   . Diabetes Paternal Grandfather   . Heart disease Paternal Grandfather   . Prostate cancer Paternal Uncle        dx. in his 3s  . Angioedema Neg Hx   .  Eczema Neg Hx     Review of Systems  All other systems reviewed and are negative.   Exam:   BP (!) 142/82 (Cuff Size: Large)   Pulse (!) 115   Ht 5\' 1"  (1.549 m)   Wt 170 lb (77.1 kg)   LMP 01/28/2017   SpO2 97%   BMI 32.12 kg/m     General appearance: alert, cooperative and appears stated age Head: normocephalic, without obvious abnormality, atraumatic Neck: no adenopathy, supple, symmetrical, trachea midline and thyroid normal to inspection and palpation Lungs: clear to auscultation bilaterally Breasts: normal appearance, no masses or tenderness, No nipple retraction or dimpling, No nipple discharge or bleeding, No axillary adenopathy Heart: regular rate and rhythm.  Systolic murmur.  Abdomen: soft, non-tender; no masses, no organomegaly Extremities: extremities normal, atraumatic, no cyanosis or edema Skin: skin color, texture, turgor normal. No rashes or lesions Lymph nodes: cervical, supraclavicular, and axillary nodes normal. Neurologic: grossly normal  Pelvic: External genitalia:  no lesions              No abnormal inguinal nodes palpated.              Urethra:  normal appearing urethra with no masses, tenderness or lesions              Bartholins and Skenes: normal                 Vagina: normal appearing vagina with normal color and discharge, no lesions              Cervix: no lesions              Pap taken: No. Bimanual Exam:  Uterus:  normal size, contour, position, consistency, mobility, non-tender              Adnexa: no mass, fullness, tenderness              Rectal exam: Yes.  .  Confirms.               Anus:  normal sphincter tone, no lesions  Chaperone was present for exam.  Assessment:   Well woman visit with normal exam. Status post robotic assisted BSO.  Benign serous cystadenofibromas. Fibroids.  FH of prostate and pancreatic cancers.  Negative genetic testing.  Cardiac murmur?   Plan: Mammogram screening discussed. Self breast awareness reviewed. Pap and HR HPV as above. Guidelines for Calcium, Vitamin D, regular exercise program including cardiovascular and weight bearing exercise. Rx for Estrace 0.5 mg daily and Prometrium 100 mg daily.   Discused WHI and use of HRT which can increase risk of PE, DVT, MI, stroke and breast cancer.  Referral for colonoscopy.  She will follow up with her PCP for shortness of breath, chest discomfort, lower extremity swelling, or any concern about murmur. Follow up annually and prn.   After visit summary provided.

## 2020-09-10 NOTE — Patient Instructions (Signed)

## 2020-09-10 NOTE — Progress Notes (Signed)
  Subjective:  Patient ID: Jacqueline Holmes, female    DOB: 1971/08/26,  MRN: [id number]  Chief Complaint  Patient presents with  . Foot Pain    heel pain    49 y.o. female presents with the above complaint. History confirmed with patient.  She has had some heel pain in both heels for several months.  Acutely worsened a few weeks ago when she had been in [place] for 2 weeks on vacation with her family and did a lot of walking.  Increasing ability since she has returned and has pain  Objective:  Physical Exam: warm, good capillary refill, no trophic changes or ulcerative lesions, normal DP and PT pulses and normal sensory exam. Left Foot: point tenderness over the heel pad  Right Foot: point tenderness over the heel pad   No images are attached to the encounter.  Radiographs: X-ray of both feet: no fracture, dislocation, swelling or degenerative changes noted, plantar calcaneal spur and posterior calcaneal spur Assessment:   1. Plantar fasciitis, left   2. Plantar fasciitis of right foot   3. Pain in right foot   4. Pain in left foot   5. Calcaneal spur, unspecified laterality   6. Ingrowing right great toenail      Plan:  Patient was evaluated and treated and all questions answered.   -XR reviewed with patient -Educated patient on stretching and icing of the affected limb -Injection delivered to the plantar fascia of both feet. -Rx for meloxicam. Educated on use, risks and benefits of the medication  -Currently wears custom molded orthotics and she will continue to wear these in her shoes  After sterile prep with povidone-iodine solution and alcohol, the bilateral heel was injected with 0.5cc 2% xylocaine plain, 0.5cc 0.5% marcaine plain, 5mg  triamcinolone acetonide, and 2mg  dexamethasone was injected along  the plantar fascia at the insertion on the plantar calcaneus. The patient tolerated the procedure well without complication.   Return in about 1 month (around 10/11/2020).

## 2020-09-18 ENCOUNTER — Other Ambulatory Visit: Payer: BC Managed Care – PPO

## 2020-09-18 ENCOUNTER — Other Ambulatory Visit: Payer: Self-pay

## 2020-09-18 DIAGNOSIS — E039 Hypothyroidism, unspecified: Secondary | ICD-10-CM

## 2020-09-19 LAB — T3, FREE: T3, Free: 3.9 pg/mL (ref 2.0–4.4)

## 2020-09-19 LAB — TSH: TSH: <0.005 u[IU]/mL — ABNORMAL LOW (ref 0.450–4.500)

## 2020-09-19 LAB — T4, FREE: Free T4: 1.61 ng/dL (ref 0.82–1.77)

## 2020-09-26 ENCOUNTER — Ambulatory Visit (INDEPENDENT_AMBULATORY_CARE_PROVIDER_SITE_OTHER): Payer: BC Managed Care – PPO

## 2020-09-26 DIAGNOSIS — J309 Allergic rhinitis, unspecified: Secondary | ICD-10-CM

## 2020-09-27 ENCOUNTER — Encounter: Payer: Self-pay | Admitting: Family Medicine

## 2020-09-27 DIAGNOSIS — E039 Hypothyroidism, unspecified: Secondary | ICD-10-CM

## 2020-09-27 MED ORDER — THYROID 90 MG PO TABS: 90.0000 mg | ORAL_TABLET | Freq: Two times a day (BID) | ORAL | 1 refills | Status: DC

## 2020-09-29 ENCOUNTER — Other Ambulatory Visit: Payer: Self-pay | Admitting: Allergy and Immunology

## 2020-09-29 DIAGNOSIS — J45901 Unspecified asthma with (acute) exacerbation: Secondary | ICD-10-CM

## 2020-10-09 ENCOUNTER — Ambulatory Visit (INDEPENDENT_AMBULATORY_CARE_PROVIDER_SITE_OTHER): Payer: BC Managed Care – PPO | Admitting: *Deleted

## 2020-10-09 DIAGNOSIS — J309 Allergic rhinitis, unspecified: Secondary | ICD-10-CM

## 2020-10-11 ENCOUNTER — Other Ambulatory Visit: Payer: Self-pay

## 2020-10-11 ENCOUNTER — Ambulatory Visit: Payer: BC Managed Care – PPO | Admitting: Podiatry

## 2020-10-11 DIAGNOSIS — M722 Plantar fascial fibromatosis: Secondary | ICD-10-CM | POA: Diagnosis not present

## 2020-10-11 MED ORDER — MELOXICAM 15 MG PO TABS
15.0000 mg | ORAL_TABLET | Freq: Every day | ORAL | 3 refills | Status: DC
Start: 2020-10-11 — End: 2020-11-19

## 2020-10-11 MED ORDER — METHYLPREDNISOLONE 4 MG PO TBPK: ORAL_TABLET | ORAL | 0 refills | Status: DC

## 2020-10-14 ENCOUNTER — Encounter: Payer: Self-pay | Admitting: Podiatry

## 2020-10-14 NOTE — Progress Notes (Signed)
  Subjective:  Patient ID: Jacqueline Holmes, female    DOB: 11-15-1971,  MRN: [id number]  Chief Complaint  Patient presents with  . Plantar Fasciitis    Pt states some mild improvement but still having heel pain.    49 y.o. female returns with the above complaint. History confirmed with patient.  Overall some improvement but still painful.  Objective:  Physical Exam: warm, good capillary refill, no trophic changes or ulcerative lesions, normal DP and PT pulses and normal sensory exam. Left Foot: point tenderness over the heel pad  Right Foot: point tenderness over the heel pad   No images are attached to the encounter.  Radiographs: X-ray of both feet: no fracture, dislocation, swelling or degenerative changes noted, plantar calcaneal spur and posterior calcaneal spur Assessment:   1. Plantar fasciitis, left   2. Plantar fasciitis of right foot      Plan:  Patient was evaluated and treated and all questions answered.    -Continue stretching and icing of the affected limb -No injection today -Rx refilled for meloxicam.  It may be beneficial to have an increased anti-inflammatory dose from a methylprednisolone taper.  I sent this to her pharmacy, she will take this for the next week and then resume meloxicam educated on use, risks and benefits of the medication  -Continue wearing orthotics -Recommend she begin physical therapy, referral sent to [facility]  Return in about 1 month (around 11/10/2020).

## 2020-10-15 ENCOUNTER — Encounter: Payer: Self-pay | Admitting: Obstetrics and Gynecology

## 2020-10-16 ENCOUNTER — Encounter: Payer: Self-pay | Admitting: Gastroenterology

## 2020-10-22 ENCOUNTER — Encounter: Payer: Self-pay | Admitting: Medical

## 2020-10-22 ENCOUNTER — Other Ambulatory Visit: Payer: Self-pay

## 2020-10-22 ENCOUNTER — Ambulatory Visit: Payer: BC Managed Care – PPO | Admitting: Medical

## 2020-10-22 VITALS — BP 122/88 | HR 97 | Ht 61.0 in | Wt 169.6 lb

## 2020-10-22 DIAGNOSIS — R109 Unspecified abdominal pain: Secondary | ICD-10-CM

## 2020-10-22 DIAGNOSIS — I10 Essential (primary) hypertension: Secondary | ICD-10-CM

## 2020-10-22 DIAGNOSIS — R6881 Early satiety: Secondary | ICD-10-CM

## 2020-10-22 DIAGNOSIS — R110 Nausea: Secondary | ICD-10-CM | POA: Diagnosis not present

## 2020-10-22 DIAGNOSIS — R142 Eructation: Secondary | ICD-10-CM | POA: Diagnosis not present

## 2020-10-22 DIAGNOSIS — K219 Gastro-esophageal reflux disease without esophagitis: Secondary | ICD-10-CM

## 2020-10-22 LAB — POCT URINALYSIS DIP (PROADVANTAGE DEVICE)
Bilirubin, UA: NEGATIVE
Blood, UA: NEGATIVE
Glucose, UA: NEGATIVE mg/dL
Ketones, POC UA: NEGATIVE mg/dL
Leukocytes, UA: NEGATIVE
Nitrite, UA: NEGATIVE
Protein Ur, POC: NEGATIVE mg/dL
Specific Gravity, Urine: 1.01
Urobilinogen, Ur: 0.2
pH, UA: 6.5 (ref 5.0–8.0)

## 2020-10-22 MED ORDER — OMEPRAZOLE 40 MG PO CPDR: 40.0000 mg | DELAYED_RELEASE_CAPSULE | Freq: Every day | ORAL | 1 refills | Status: DC

## 2020-10-22 NOTE — Progress Notes (Signed)
Subjective:  Jacqueline Holmes is a 49 y.o. female who presents for Chief Complaint  Patient presents with  . Abdominal Pain     Here for abdominal pain.  She has been having some pains for a little over a week.  Appetite decreased.  Has early satiety.  Over the last week she has had worsening nausea, indigestion, belching a lot.  Not as hungry.  Does not necessarily have pain with eating.  Sometimes has hunger pains.  No urinary symptoms, no constipation or diarrhea, no blood in the stool, no pelvic pain, no vaginal bleeding.  She has been on meloxicam recently for a while for plantar fasciitis, but in the last week she was on a round of steroid for the plantar fasciitis.  This was instead of the meloxicam.  She had also taken some ibuprofen in recent weeks.  She avoids GERD triggers such as peppers and hot sauce and spicy foods.  No other chest pain, no shortness of breath, no swelling in the legs.  No palpitations.  No alcohol in a long time  Nonsmoker  No other aggravating or relieving factors.    No other c/o.  The following portions of the patient's history were reviewed and updated as appropriate: allergies, current medications, past family history, past medical history, past social history, past surgical history and problem list.  ROS Otherwise as in subjective above    Objective: BP 122/88   Pulse 97   Ht 5\' 1"  (1.549 m)   Wt 169 lb 9.6 oz (76.9 kg)   LMP 01/28/2017   SpO2 97%   BMI 32.05 kg/m   General appearance: alert, no distress, well developed, well nourished Oral cavity: MMM, no lesions Neck: supple, no lymphadenopathy, no thyromegaly, no masses Heart: RRR, normal S1, S2, no murmurs Lungs: CTA bilaterally, no wheezes, rhonchi, or rales Abdomen: +bs, soft, generalized tenderness lower abdomen and in the umbilical region but not specifically tender in the upper abdomen, non tender, non distended, no masses, no hepatomegaly, no splenomegaly Back nontender Pulses: 2+  radial pulses, 2+ pedal pulses, normal cap refill Ext: no edema   Assessment: Encounter Diagnoses  Name Primary?  . Abdominal pain, unspecified abdominal location Yes  . Nausea   . Belching   . Elevated blood pressure reading in office with diagnosis of hypertension   . Gastroesophageal reflux disease, unspecified whether esophagitis present   . Early satiety      Plan: We discussed her symptoms and concerns of possible differential.  I doubt any cardiac issue.  She had some white coat hypertension that improved at the end of the visit.  I suspect given the recent NSAIDs followed by round of prednisone and stress last week probably triggered some gastritis or GERD.  Begin omeprazole.  Discussed proper use of medication.  Can use Tums as needed.  Advised Pepto-Bismol twice a day for the next few days.  Advised to use the omeprazole for at least 2 to 3 weeks  Elevated blood pressure-she will monitor blood pressures outside of doctor's office for now.  Discussed risk of uncontrolled high blood pressure.  Arleny was seen today for abdominal pain.  Diagnoses and all orders for this visit:  Abdominal pain, unspecified abdominal location -     POCT Urinalysis DIP (Proadvantage Device)  Nausea -     POCT Urinalysis DIP (Proadvantage Device)  Belching -     POCT Urinalysis DIP (Proadvantage Device)  Elevated blood pressure reading in office with diagnosis of hypertension  Gastroesophageal reflux disease, unspecified whether esophagitis present  Early satiety  Other orders -     omeprazole (PRILOSEC) 40 MG capsule; Take 1 capsule (40 mg total) by mouth daily.    Follow up: 2 weeks

## 2020-10-31 ENCOUNTER — Encounter: Payer: Self-pay | Admitting: Family Medicine

## 2020-11-01 ENCOUNTER — Ambulatory Visit (INDEPENDENT_AMBULATORY_CARE_PROVIDER_SITE_OTHER): Payer: BC Managed Care – PPO | Admitting: *Deleted

## 2020-11-01 DIAGNOSIS — J309 Allergic rhinitis, unspecified: Secondary | ICD-10-CM | POA: Diagnosis not present

## 2020-11-04 ENCOUNTER — Other Ambulatory Visit: Payer: Self-pay

## 2020-11-04 ENCOUNTER — Ambulatory Visit: Payer: BC Managed Care – PPO | Attending: Podiatry

## 2020-11-04 DIAGNOSIS — M25674 Stiffness of right foot, not elsewhere classified: Secondary | ICD-10-CM | POA: Diagnosis present

## 2020-11-04 DIAGNOSIS — M25572 Pain in left ankle and joints of left foot: Secondary | ICD-10-CM | POA: Diagnosis not present

## 2020-11-04 DIAGNOSIS — R262 Difficulty in walking, not elsewhere classified: Secondary | ICD-10-CM | POA: Diagnosis present

## 2020-11-04 DIAGNOSIS — M722 Plantar fascial fibromatosis: Secondary | ICD-10-CM | POA: Insufficient documentation

## 2020-11-04 DIAGNOSIS — M25675 Stiffness of left foot, not elsewhere classified: Secondary | ICD-10-CM | POA: Insufficient documentation

## 2020-11-04 NOTE — Therapy (Signed)
[place], [place], [address] Phone: [phone]   Fax:  [phone]  Physical Therapy Evaluation  Patient Details  Name: Jacqueline Holmes MRN: [id number] Date of Birth: 04/16/1971 Referring Provider (PT): Lanae Crumbly, DPM   Encounter Date: 11/04/2020   PT End of Session - 11/04/20 1628    Visit Number 1    Number of Visits 12    Date for PT Re-Evaluation 12/13/20    Authorization Type BCBS    PT Start Time 0330    PT Stop Time 0415    PT Time Calculation (min) 45 min    Activity Tolerance Patient tolerated treatment well    Behavior During Therapy [facility] for tasks assessed/performed           Past Medical History:  Diagnosis Date  . Abnormal uterine bleeding (AUB)    march/april 2021  . Angio-edema   . Asthma   . Cough    non prod  . Diabetes mellitus arising in pregnancy    now pre-diabetes  . Family history of breast cancer    MGM  . Family history of pancreatic cancer   . Family history of prostate cancer   . Fatigue   . Generalized headaches    migraines on occasion.  Berenice Primas disease 03/2010  . History of COVID-19 06/26/2020  . Hyperlipidemia   . Hypothyroidism    s/p thyroidectomy for Grave's disease (Dr. Tye Savoy at [facility])  . IBS (irritable bowel syndrome)   . Incontinence   . Infertility, female   . Migraine    without aura  . Plantar fasciitis, bilateral 2021  . Pre-diabetes   . Recurrent upper respiratory infection (URI)    last uri dec 2020  . Sinus problem    runny nose  . Sleep apnea    mild no cpap needed   . Uterine polyp     Past Surgical History:  Procedure Laterality Date  . ABDOMINOPLASTY  12/2015   Dr. Towanda Malkin  . BREAST REDUCTION SURGERY  11/03/2018  . BREAST SURGERY  10/2018   breast reduction  . CESAREAN SECTION     x 1  . DILATATION & CURETTAGE/HYSTEROSCOPY WITH TRUECLEAR N/A 01/16/2014   Procedure: DILATATION &  CURETTAGE/HYSTEROSCOPY WITH TRUCLEAR;  Surgeon: Marylynn Pearson, MD;  Location: [facility];  Service: Gynecology;  Laterality: N/A;  . LASIK    . prk laser eye surgery Left 08/2019  . ROBOTIC ASSISTED SALPINGO OOPHERECTOMY Bilateral 03/28/2020   Procedure: XI ROBOTIC ASSISTED SALPINGO OOPHORECTOMY;;  Surgeon: Lafonda Mosses, MD;  Location: [facility];  Service: Gynecology;  Laterality: Bilateral;  . skin cancer remove  06/2020  . TOTAL THYROIDECTOMY  07/30/11   Dr. Harlow Asa (Grave's disease)  . VULVA Milagros Loll BIOPSY N/A 01/16/2014   Procedure: VULVAR BIOPSY;  Surgeon: Marylynn Pearson, MD;  Location: [facility];  Service: Gynecology;  Laterality: N/A;    There were no vitals filed for this visit.    Subjective Assessment - 11/04/20 1536    Subjective She reports LT foot pain .   She reports always having  foot pain when on her feet. 3 months ago her pain increased when she is on her feet from changing from heel to ball of foot.   She had received injections without benefits.  Meds with some benefit. Steriods  with some benefit.    She has orthotics. made in past year.    She stretches when in bed.  Limitations Standing;Walking    How long can you stand comfortably? 10 min, She ws able in past to stand 20 min    How long can you walk comfortably? max time depends on day  But she is not sure.   prior she was able to walk for an hour.    Diagnostic tests xrays : spurs    Patient Stated Goals She wants to  have less pain.    Currently in Pain? Yes    Pain Score 3     Pain Location Foot    Pain Orientation Medial;Left   plantar   Pain Descriptors / Indicators Dull;Aching;Sharp    Pain Type Chronic pain    Pain Onset More than a month ago    Pain Frequency Constant    Aggravating Factors  weight bearing    Pain Relieving Factors sitting.              [facility] PT Assessment - 11/04/20 0001      Assessment   Medical Diagnosis plantar fascitis  LT     Referring Provider (PT)  Lanae Crumbly, DPM    Onset Date/Surgical Date --   worse 3 months ago   Next MD Visit Nest week    Prior Therapy no      Precautions   Precautions None      Restrictions   Weight Bearing Restrictions No      Balance Screen   Has the patient fallen in the past 6 months No      Prior Function   Level of Independence Independent    Vocation Full time employment    Vocation Requirements No impact. Walks on campus can increase pain    Leisure Less walking      Cognition   Overall Cognitive Status Within Functional Limits for tasks assessed      ROM / Strength   AROM / PROM / Strength AROM;PROM;Strength      AROM   AROM Assessment Site Ankle    Right/Left Ankle Right;Left    Right Ankle Dorsiflexion 90    Right Ankle Plantar Flexion 55    Right Ankle Inversion 30    Right Ankle Eversion 30    Left Ankle Dorsiflexion 90    Left Ankle Plantar Flexion 55    Left Ankle Inversion 28    Left Ankle Eversion 28      Ambulation/Gait   Gait Comments RT foot /leg ER                      Objective measurements completed on examination: See above findings.               PT Education - 11/04/20 1634    Education Details POC , HEP    FOTO score and possible improvement    Person(s) Educated Patient    Methods Explanation;Demonstration;Verbal cues;Handout    Comprehension Verbalized understanding;Returned demonstration            PT Short Term Goals - 11/04/20 1619      PT SHORT TERM GOAL #1   Title she will be indpendent with initial HEp    Time 3    Period Weeks    Status New      PT SHORT TERM GOAL #2   Title FOTO score improved 10 points    Time 3    Period Weeks    Status New      PT SHORT TERM GOAL #3  Title She willl report able to stand for > 10 min before incr pain    Time 3    Period Weeks    Status New      PT SHORT TERM GOAL #4   Title Active DF will incr to 95 degrees bilaterally    Time 3    Period Weeks    Status New               PT Long Term Goals - 11/04/20 1621      PT LONG TERM GOAL #1   Title She will be indpendent with all HEP issued    Time 6    Period Weeks    Status New      PT LONG TERM GOAL #2   Title FOTO score will improve to 67% or better    Time 6    Period Weeks    Status New      PT LONG TERM GOAL #3   Title Her pain will be intermittant and only with walking 1/2 mile or more    Time 6    Period Weeks    Status New      PT LONG TERM GOAL #4   Title Active DF of each foot to 100 degreees to decr tension with walking to decr pain.    Time 6    Period Weeks    Status New      PT LONG TERM GOAL #5   Title She will e able to walk into work with no pain    Time 6    Period Weeks    Status New                  Plan - 11/04/20 1623    Clinical Impression Statement Ms Snook presents with pain both feet LT> RT in plantar aspect  of feet. She prevously had foot pain but not this bad. She has had medical intervention with limited benefit. She has tight ness in both feet with DF and this can incr tension with walking and standing.  She should benefit from consistent sttretching exeriss and skilled Pt to decr pain and improve painfree function.    Personal Factors and Comorbidities Time since onset of injury/illness/exacerbation;Past/Current Experience    Examination-Activity Limitations Stairs;Locomotion Level;Stand    Examination-Participation Restrictions Community Activity;Occupation    Stability/Clinical Decision Making Stable/Uncomplicated    Clinical Decision Making Low    Rehab Potential Good    PT Frequency 2x / week    PT Duration 6 weeks    PT Treatment/Interventions Taping;Passive range of motion;Manual techniques;Patient/family education;Therapeutic activities;Therapeutic exercise;Cryotherapy;Moist Heat;Ultrasound;Iontophoresis 4mg /ml Dexamethasone    PT Next Visit Plan Review stretches for home and add to HEP for strength and ROm. Maual stretching and  mobs, Modlities as helpful    PT Home Exercise Plan DF stretch gastroc and soleus, arch lift    Consulted and Agree with Plan of Care Patient           Patient will benefit from skilled therapeutic intervention in order to improve the following deficits and impairments:  Pain, Difficulty walking, Decreased range of motion, Decreased activity tolerance  Visit Diagnosis: Pain in left ankle and joints of left foot  Plantar fasciitis of left foot  Difficulty in walking, not elsewhere classified     Problem List Patient Active Problem List   Diagnosis Date Noted  . Elevated blood pressure reading in office with diagnosis of hypertension 10/22/2020  . Belching  10/22/2020  . Nausea 10/22/2020  . Abdominal pain 10/22/2020  . Gastroesophageal reflux disease 10/22/2020  . Early satiety 10/22/2020  . Seasonal and perennial allergic rhinitis 08/23/2020  . Genetic testing 03/05/2020  . Family history of prostate cancer   . Family history of pancreatic cancer   . Cysts of both ovaries 02/09/2020  . Allergic conjunctivitis of both eyes 10/06/2019  . Elevated ALT measurement 01/28/2018  . Moderate persistent asthma with acute exacerbation 01/18/2018  . Allergic conjunctivitis 01/18/2018  . Elevated blood-pressure reading without diagnosis of hypertension 01/18/2018  . Non-seasonal allergic rhinitis due to pollen 05/03/2017  . Mild persistent asthma, uncomplicated 17/91/5056  . Thyroid nodule, uninodular 09/28/2011  . Unspecified hypothyroidism 09/10/2011  . Allergic rhinitis 09/10/2011  . Asthma 04/25/2008  . Contact dermatitis and other eczema due to plants (except food) 08/04/2007    Darrel Hoover  PT 11/04/2020, 4:34 PM  [facility] [address] [place], [place], [address] Phone: [phone]   Fax:  [phone]  Name: Avalyn Molino MRN: [id number] Date of Birth: 1971/02/19

## 2020-11-04 NOTE — Patient Instructions (Signed)
Seated and standing DF stretch for gastroc and soleus  X 2-3 reps 2-3x/day 30 sec RT/LT foot  Arch raises 10 reps 1-2 sets 2x/day RT/LT foot

## 2020-11-14 ENCOUNTER — Ambulatory Visit: Payer: BC Managed Care – PPO | Admitting: Podiatry

## 2020-11-18 ENCOUNTER — Ambulatory Visit
Admission: RE | Admit: 2020-11-18 | Discharge: 2020-11-18 | Disposition: A | Discharge status: Home / Self Care | Payer: BC Managed Care – PPO | Source: Ambulatory Visit | Attending: Family Medicine | Admitting: Family Medicine

## 2020-11-19 ENCOUNTER — Telehealth: Payer: Self-pay

## 2020-11-19 ENCOUNTER — Ambulatory Visit (HOSPITAL_COMMUNITY)
Admission: EM | Admit: 2020-11-19 | Discharge: 2020-11-19 | Disposition: A | Discharge status: Home / Self Care | Payer: BC Managed Care – PPO

## 2020-11-19 ENCOUNTER — Other Ambulatory Visit: Payer: Self-pay

## 2020-11-19 ENCOUNTER — Ambulatory Visit: Payer: BC Managed Care – PPO

## 2020-11-19 ENCOUNTER — Ambulatory Visit (INDEPENDENT_AMBULATORY_CARE_PROVIDER_SITE_OTHER): Payer: BC Managed Care – PPO

## 2020-11-19 ENCOUNTER — Encounter (HOSPITAL_COMMUNITY): Payer: Self-pay

## 2020-11-19 DIAGNOSIS — S8002XA Contusion of left knee, initial encounter: Secondary | ICD-10-CM

## 2020-11-19 DIAGNOSIS — M25562 Pain in left knee: Secondary | ICD-10-CM

## 2020-11-19 DIAGNOSIS — J309 Allergic rhinitis, unspecified: Secondary | ICD-10-CM

## 2020-11-19 DIAGNOSIS — W19XXXA Unspecified fall, initial encounter: Secondary | ICD-10-CM | POA: Diagnosis not present

## 2020-11-19 DIAGNOSIS — Y9321 Activity, ice skating: Secondary | ICD-10-CM | POA: Diagnosis not present

## 2020-11-19 DIAGNOSIS — R030 Elevated blood-pressure reading, without diagnosis of hypertension: Secondary | ICD-10-CM

## 2020-11-19 IMAGING — DX DG KNEE COMPLETE 4+V*L*
5 series · 5 of 5 positions shown · non-contrast
Comparison: None

CLINICAL DATA: LEFT knee pain mainly patellar, fell ice skating 1
week ago

EXAM:
LEFT KNEE - COMPLETE 4+ VIEW

[knee ap]
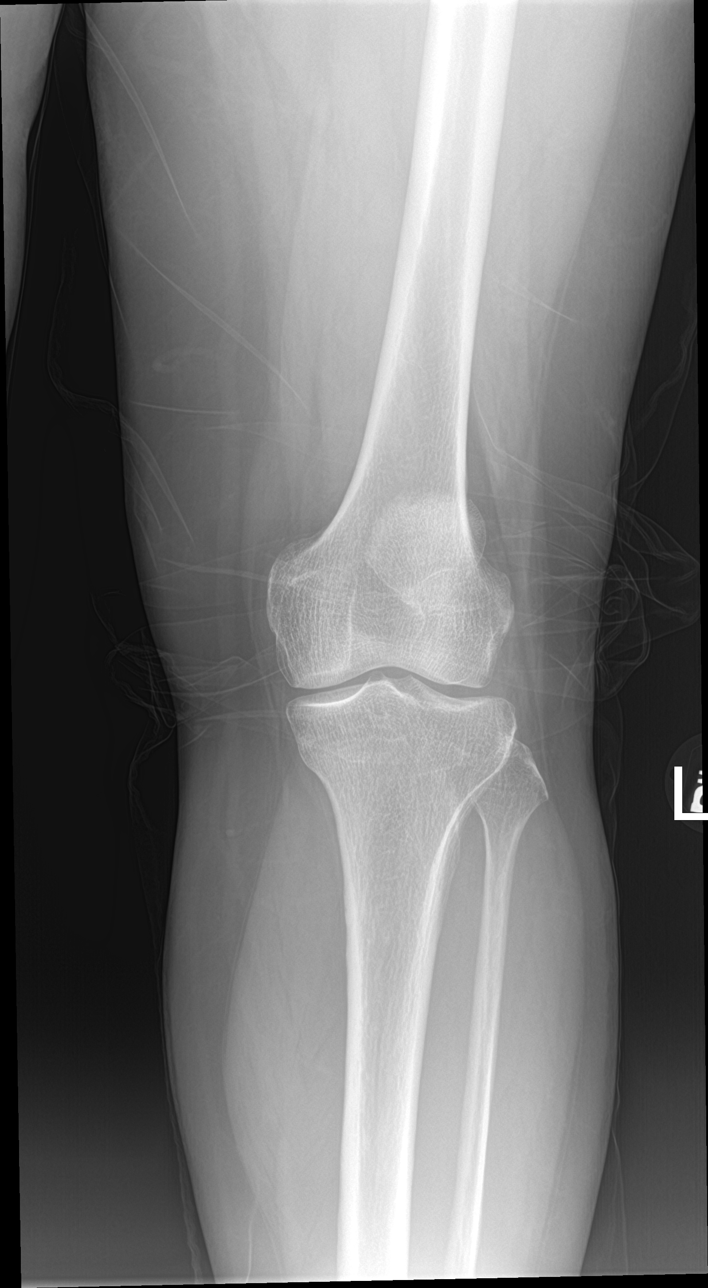

[knee obl (1 of 2)]
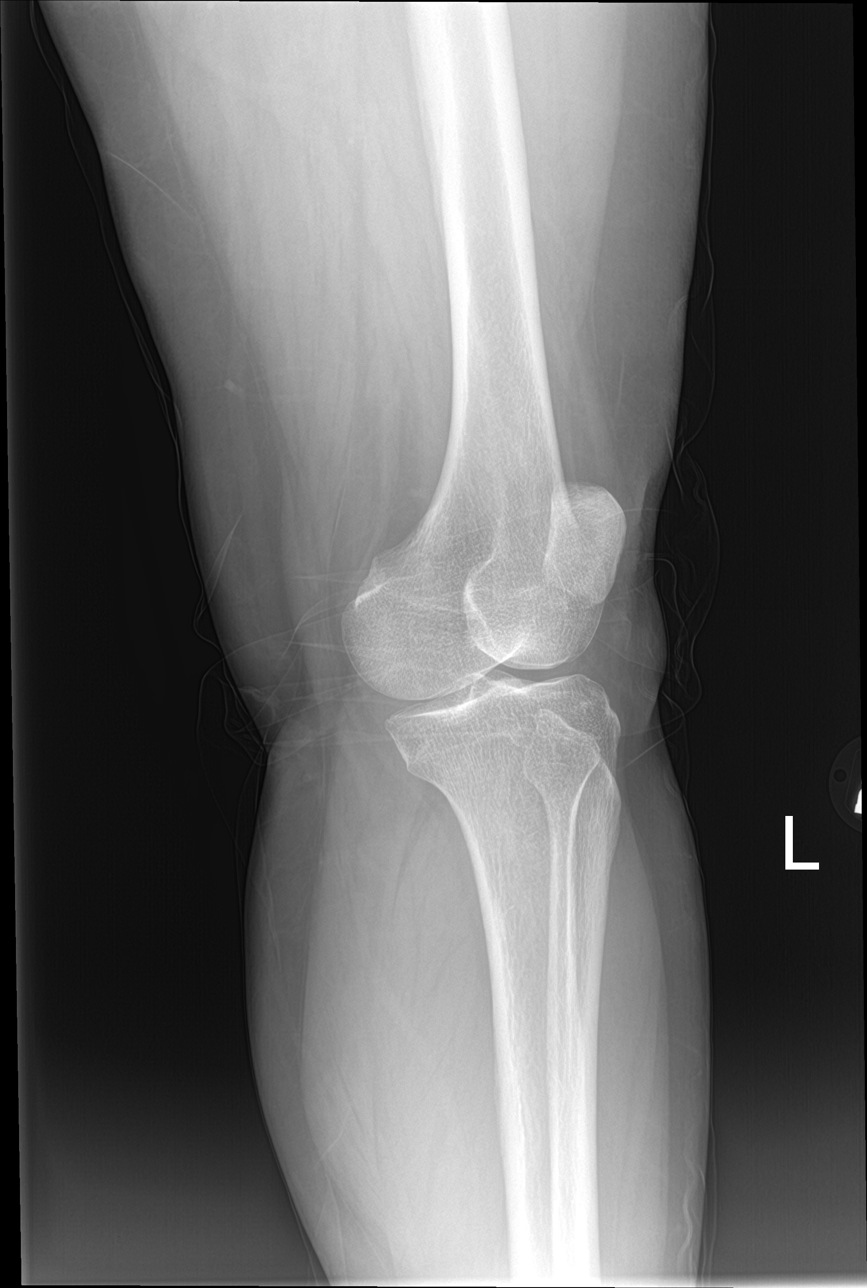

[knee obl (2 of 2)]
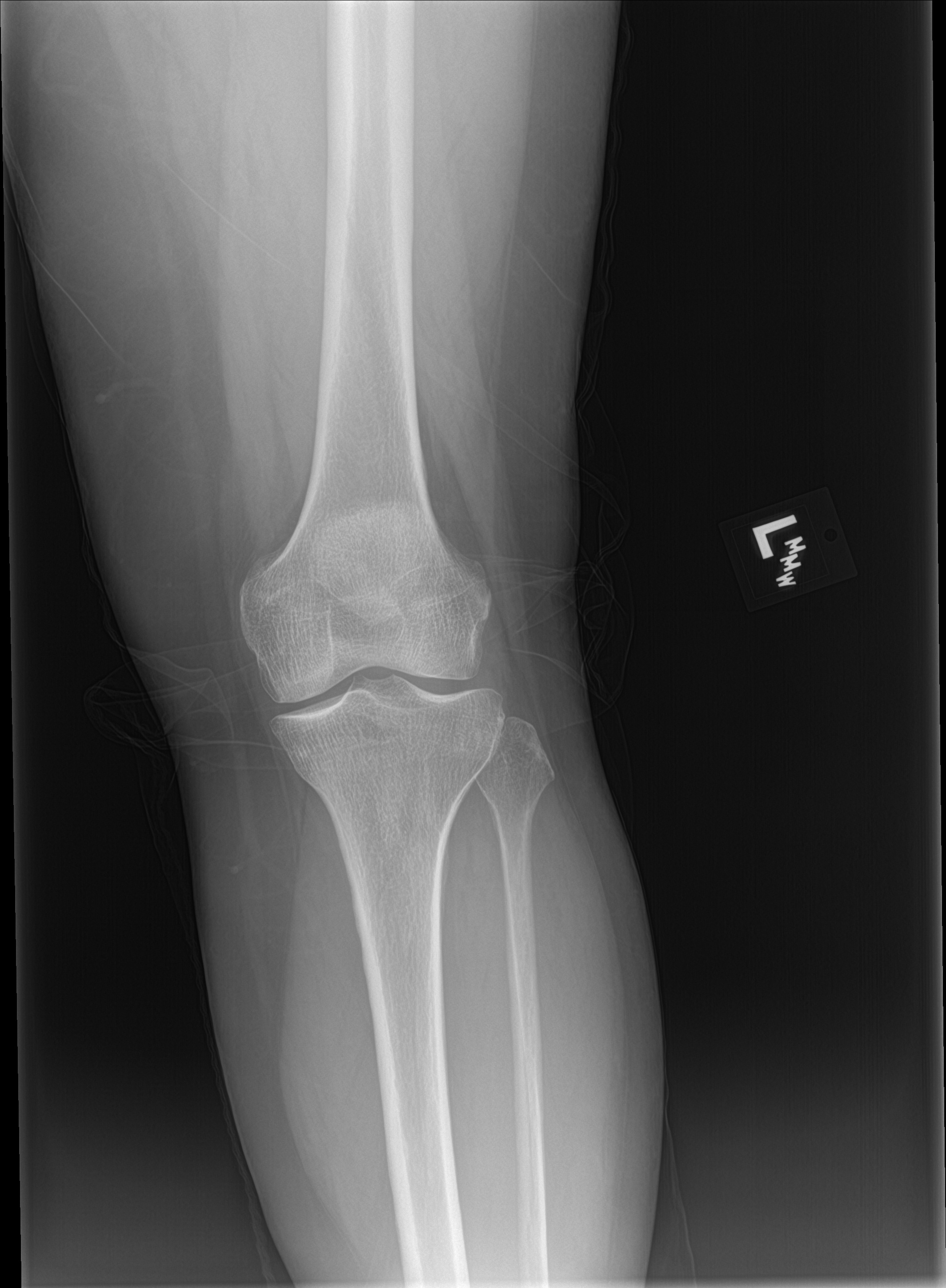

[knee lat]
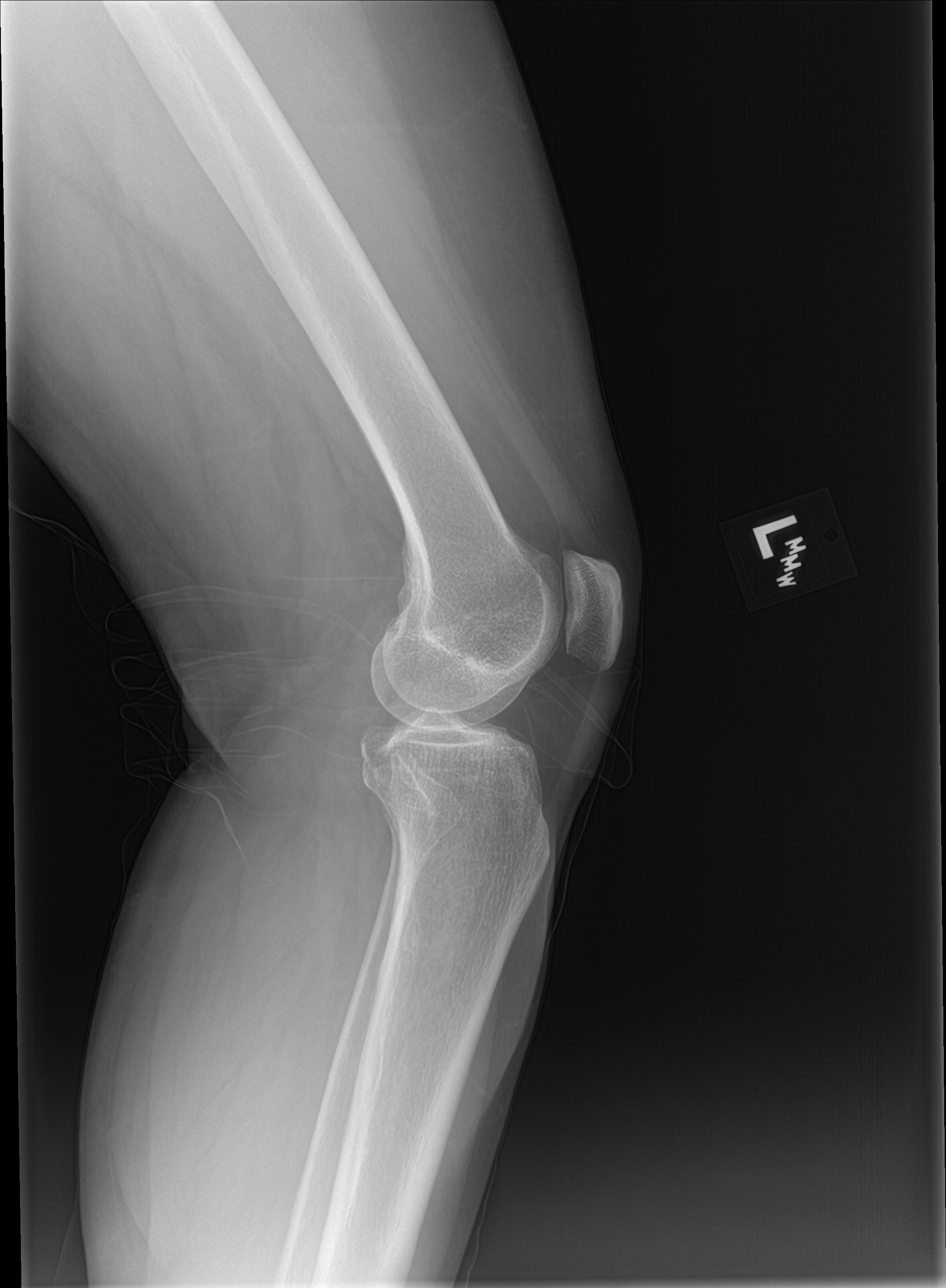

[knee sunrise]
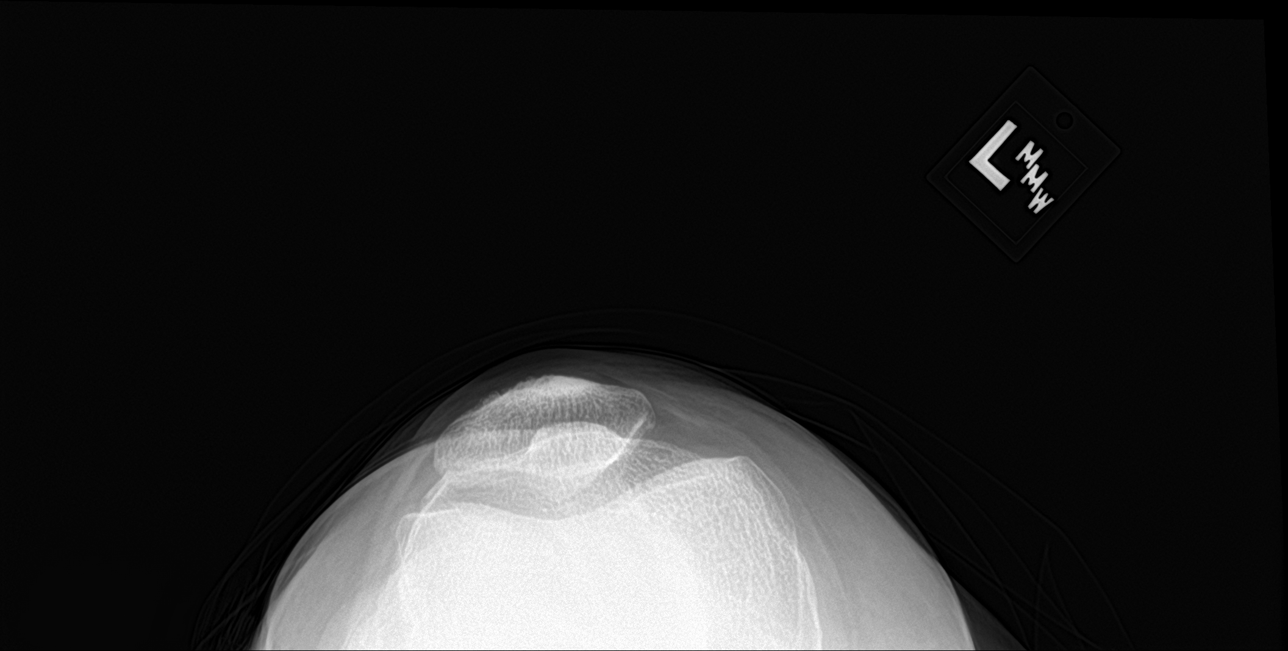

[5 of 5 positions shown; findings below may reference images not displayed]

FINDINGS: Osseous mineralization low normal.

Joint spaces preserved.

Superimposed clothing artifacts.

No acute fracture, dislocation, or bone destruction.

No joint effusion.
IMPRESSION: No acute osseous abnormalities.

## 2020-11-19 NOTE — Discharge Instructions (Addendum)
Recommend continue to monitor symptoms. May take OTC Aleve 1 to 2 tablets every 12 hours as needed for pain and swelling. Recommend if pain continues, call [facility] later this week or early next week for further evaluation.

## 2020-11-19 NOTE — ED Triage Notes (Signed)
Pt presents with left knee pain x 1 week. States she was ice skating and fell over left knee. Pain is worse when leg is straight.

## 2020-11-19 NOTE — Telephone Encounter (Signed)
Patient states she forgot about scheduled appointment today. Was reminded of next appointment scheduled on 11/21/20 and states she plans to attend this appointment.

## 2020-11-19 NOTE — ED Provider Notes (Signed)
[place]    CSN: [id number] Arrival date & time: 11/19/20  [id number]      History   Chief Complaint Chief Complaint  Patient presents with   Knee Pain    HPI Jacqueline Holmes is a 49 y.o. female.   49 year old female presents with left knee anterior pain that has been present for about 1 week. She went ice skating about 1 week ago and fell on the ice and landed on her left knee on the lower part of her patella. She did not skate more but was able to walk off the ice. She continues to have some pain and now the pain is more of a dull ache on the anterior, medial aspect of her left knee but will have occasional shooting pains that last a few minutes, especially when straightening her leg. No radiation of pain or numbness. No previous injury to her left knee. Has been taking Tylenol with minimal relief. Does have issues with left foot plantar fascitis and concerned her left knee injury is making the plantar fascitis worse. Other chronic health issues include thyroid disorder, environmental allergies, asthma, abnormal uterine bleeding and GERD. Currently takes Allegra, Armour thyroid, Estradiol, Progesterone, Qvar, Prilosec daily and Symbicort and Albuterol prn.   The history is provided by the patient.    Past Medical History:  Diagnosis Date   Abnormal uterine bleeding (AUB)    march/april 2021   Angio-edema    Asthma    Cough    non prod   Diabetes mellitus arising in pregnancy    now pre-diabetes   Family history of breast cancer    MGM   Family history of pancreatic cancer    Family history of prostate cancer    Fatigue    Generalized headaches    migraines on occasion.   Graves disease 03/2010   History of COVID-19 06/26/2020   Hyperlipidemia    Hypothyroidism    s/p thyroidectomy for Grave's disease (Dr. Tye Savoy at [facility])   IBS (irritable bowel syndrome)    Incontinence    Infertility, female    Migraine    without aura    Plantar fasciitis, bilateral 2021   Pre-diabetes    Recurrent upper respiratory infection (URI)    last uri dec 2020   Sinus problem    runny nose   Sleep apnea    mild no cpap needed    Uterine polyp     Patient Active Problem List   Diagnosis Date Noted   Elevated blood pressure reading in office with diagnosis of hypertension 10/22/2020   Belching 10/22/2020   Nausea 10/22/2020   Abdominal pain 10/22/2020   Gastroesophageal reflux disease 10/22/2020   Early satiety 10/22/2020   Seasonal and perennial allergic rhinitis 08/23/2020   Genetic testing 03/05/2020   Family history of prostate cancer    Family history of pancreatic cancer    Cysts of both ovaries 02/09/2020   Allergic conjunctivitis of both eyes 10/06/2019   Elevated ALT measurement 01/28/2018   Moderate persistent asthma with acute exacerbation 01/18/2018   Allergic conjunctivitis 01/18/2018   Elevated blood-pressure reading without diagnosis of hypertension 01/18/2018   Non-seasonal allergic rhinitis due to pollen 05/03/2017   Mild persistent asthma, uncomplicated [id number]   Thyroid nodule, uninodular 09/28/2011   Unspecified hypothyroidism 09/10/2011   Allergic rhinitis 09/10/2011   Asthma 04/25/2008   Contact dermatitis and other eczema due to plants (except food) 08/04/2007    Past Surgical History:  Procedure Laterality Date   ABDOMINOPLASTY  12/2015   Dr. Towanda Malkin   BREAST REDUCTION SURGERY  11/03/2018   BREAST SURGERY  10/2018   breast reduction   CESAREAN SECTION     x 1   DILATATION & CURETTAGE/HYSTEROSCOPY WITH TRUECLEAR N/A 01/16/2014   Procedure: DILATATION & CURETTAGE/HYSTEROSCOPY WITH TRUCLEAR;  Surgeon: Marylynn Pearson, MD;  Location: [facility];  Service: Gynecology;  Laterality: N/A;   LASIK     prk laser eye surgery Left 08/2019   ROBOTIC ASSISTED SALPINGO OOPHERECTOMY Bilateral 03/28/2020   Procedure: XI ROBOTIC ASSISTED SALPINGO OOPHORECTOMY;;   Surgeon: Lafonda Mosses, MD;  Location: [facility];  Service: Gynecology;  Laterality: Bilateral;   skin cancer remove  06/2020   TOTAL THYROIDECTOMY  07/30/11   Dr. Harlow Asa (Grave's disease)   [place] Dana Milagros Loll BIOPSY N/A 01/16/2014   Procedure: VULVAR BIOPSY;  Surgeon: Marylynn Pearson, MD;  Location: [facility];  Service: Gynecology;  Laterality: N/A;    OB History    Gravida  2   Para  1   Term      Preterm      AB      Living  1     SAB      IAB      Ectopic      Multiple      Live Births               Home Medications    Prior to Admission medications   Medication Sig Start Date End Date Taking? Authorizing Provider  acetaminophen (TYLENOL) 500 MG tablet Take 1,000 mg by mouth every 6 (six) hours as needed for headache. Reported on 06/17/2016    [provider]  budesonide-formoterol (SYMBICORT) 160-4.5 MCG/ACT inhaler Two puffs with spacer device twice a day during asthma flares.  Rinse, gargle and spit after use. 08/23/20   Ambs, Kathrine Cords, FNP  estradiol (ESTRACE) 0.5 MG tablet Take 1 tablet (0.5 mg total) by mouth daily. 09/10/20   Nunzio Cobbs, MD  Fexofenadine HCl (ALLEGRA PO) Take by mouth.    [provider]  omeprazole (PRILOSEC) 40 MG capsule Take 1 capsule (40 mg total) by mouth daily. 10/22/20   Tysinger, Camelia Eng, PA-C  PROAIR HFA 108 (90 Base) MCG/ACT inhaler Inhale 2 puffs into the lungs every 4 (four) hours as needed. 08/23/20   Dara Hoyer, FNP  progesterone (PROMETRIUM) 100 MG capsule Take 1 capsule (100 mg total) by mouth at bedtime. 09/10/20   Nunzio Cobbs, MD  QVAR REDIHALER 80 MCG/ACT inhaler INHALE 2 PUFFS TWICE DAILY 09/30/20   Bobbitt, [place] Muta, MD  Spacer/Aero-Holding Chambers [facility] DIAMOND) [facility] Use as directed with inhaler. Dx:  J45.40 Asthma 01/20/18   Bobbitt, [place] Muta, MD  thyroid (ARMOUR) 90 MG tablet Take 90 mg by mouth daily.    [provider]   levocetirizine (XYZAL) 5 MG tablet Take 1 tablet (5 mg total) by mouth every evening. Pt will need office visit in order to get more refills. 08/23/20 11/19/20  Dara Hoyer, FNP  metFORMIN (GLUCOPHAGE-XR) 500 MG 24 hr tablet Take 2 tablets (1,000 mg total) by mouth daily with breakfast. 07/31/20 11/19/20  [name], MD    Family History Family History  Problem Relation Age of Onset   Prostate cancer Father        dx. in his 69s   Allergic rhinitis Mother    Asthma Mother    Alpha-1  antitrypsin deficiency Mother    COPD Mother    Diabetes Maternal Grandmother    Pancreatic cancer Maternal Grandmother        dx. in her early 79s   Heart disease Maternal Grandfather        CABG in 60's   Diabetes Maternal Grandfather    Diabetes Paternal Grandmother    Diabetes Paternal Grandfather    Heart disease Paternal Grandfather    Prostate cancer Paternal Uncle        dx. in his 43s   Angioedema Neg Hx    Eczema Neg Hx     Social History Social History   Tobacco Use   Smoking status: Never Smoker   Smokeless tobacco: Never Used  Scientific laboratory technician Use: Never used  Substance Use Topics   Alcohol use: Yes    Comment: once a month   Drug use: No     Allergies   Patient has no known allergies.   Review of Systems Review of Systems  Constitutional: Negative for activity change, appetite change, chills, fatigue and fever.  Respiratory: Negative for chest tightness and shortness of breath.   Cardiovascular: Negative for chest pain and leg swelling.  Gastrointestinal: Negative for nausea and vomiting.  Musculoskeletal: Positive for arthralgias and joint swelling. Negative for gait problem and myalgias.  Skin: Positive for color change. Negative for rash and wound.  Allergic/Immunologic: Positive for environmental allergies. Negative for food allergies and immunocompromised state.  Neurological: Negative for dizziness, tremors, seizures, syncope, weakness,  light-headedness and numbness.  Hematological: Negative for adenopathy. Does not bruise/bleed easily.     Physical Exam Triage Vital Signs ED Triage Vitals  Enc Vitals Group     BP 11/19/20 0856 (!) 155/90     Pulse Rate 11/19/20 0856 (!) 107     Resp 11/19/20 0856 20     Temp 11/19/20 0856 97.6 F (36.4 C)     Temp Source 11/19/20 0856 Oral     SpO2 11/19/20 0856 96 %     Weight --      Height --      Head Circumference --      Peak Flow --      Pain Score 11/19/20 0853 2     Pain Loc --      Pain Edu? --      Excl. in [place]? --    No data found.  Updated Vital Signs BP (!) 155/90 (BP Location: Right Arm)    Pulse (!) 107    Temp 97.6 F (36.4 C) (Oral)    Resp 20    LMP 01/28/2017    SpO2 96%   Visual Acuity Right Eye Distance:   Left Eye Distance:   Bilateral Distance:    Right Eye Near:   Left Eye Near:    Bilateral Near:     Physical Exam Vitals and nursing note reviewed.  Constitutional:      General: She is awake. She is not in acute distress.    Appearance: She is well-developed and well-groomed. She is not ill-appearing.     Comments: She is sitting in the exam chair in no acute distress.   HENT:     Head: Normocephalic and atraumatic.  Cardiovascular:     Rate and Rhythm: Tachycardia present.     Pulses: Normal pulses.  Pulmonary:     Effort: Pulmonary effort is normal.  Musculoskeletal:        General: Swelling and tenderness present.  Normal range of motion.     Right knee: Normal.     Left knee: Swelling and ecchymosis present. No erythema or crepitus. Normal range of motion. Tenderness present over the MCL. Normal alignment, normal meniscus and normal patellar mobility. Normal pulse.     Instability Tests: Anterior drawer test negative.     Right lower leg: No edema.     Left lower leg: No edema.       Legs:     Comments: Has full range of motion of left knee but pain with full extension. Tender along anterior medial to lower aspect of patella.  Bruising and some swelling present in area. No pain above patella or at lateral or posterior aspect of knee. Good distal pulses. No neuro deficits noted.   Skin:    General: Skin is warm and dry.     Capillary Refill: Capillary refill takes less than 2 seconds.     Findings: Bruising present. No abrasion, burn, erythema, laceration, lesion, petechiae, rash or wound.  Neurological:     General: No focal deficit present.     Mental Status: She is alert and oriented to person, place, and time.     Sensory: Sensation is intact. No sensory deficit.     Motor: Motor function is intact.     Gait: Gait is intact.  Psychiatric:        Mood and Affect: Mood normal.        Behavior: Behavior normal. Behavior is cooperative.        Thought Content: Thought content normal.        Judgment: Judgment normal.      UC Treatments / Results  Labs (all labs ordered are listed, but only abnormal results are displayed) Labs Reviewed - No data to display  EKG   Radiology DG Knee Complete 4 Views Left  Result Date: 11/19/2020 CLINICAL DATA:  LEFT knee pain mainly patellar, fell ice skating 1 week ago EXAM: LEFT KNEE - COMPLETE 4+ VIEW COMPARISON:  None FINDINGS: Osseous mineralization low normal. Joint spaces preserved. Superimposed clothing artifacts. No acute fracture, dislocation, or bone destruction. No joint effusion. IMPRESSION: No acute osseous abnormalities. Electronically Signed   By: Lavonia Dana M.D.   On: 11/19/2020 09:55    Procedures Procedures (including critical care time)  Medications Ordered in UC Medications - No data to display  Initial Impression / Assessment and Plan / UC Course  I have reviewed the triage vital signs and the nursing notes.  Pertinent labs & imaging results that were available during my care of the patient were reviewed by me and considered in my medical decision making (see chart for details).    Reviewed x-ray findings with patient- no fracture or joint  effusion. Discussed that she may have a moderate contusion to her patella and that this should slowly improve over the next 2 weeks. Recommend take OTC Aleve 1 to 2 tablets every 12 hours as needed for pain and swelling. Continue to monitor blood pressure- slightly elevated today- patient indicates blood pressure sometimes higher in office than at home. Recommend attend PT today for plantar fascitis and discuss recent knee contusion/injury. Recommend if pain continues or gets worse over the next 4 to 5 days, call [facility] for further evaluation.  Final Clinical Impressions(s) / UC Diagnoses   Final diagnoses:  Anterior knee pain, left  Contusion of left knee, initial encounter  Fall, initial encounter  Elevated blood pressure reading in office without diagnosis of  hypertension     Discharge Instructions     Recommend continue to monitor symptoms. May take OTC Aleve 1 to 2 tablets every 12 hours as needed for pain and swelling. Recommend if pain continues, call [facility] later this week or early next week for further evaluation.     ED Prescriptions    None     PDMP not reviewed this encounter.   Katy Apo, NP 11/19/20 [phone]

## 2020-11-21 ENCOUNTER — Other Ambulatory Visit: Payer: Self-pay

## 2020-11-21 ENCOUNTER — Ambulatory Visit: Payer: BC Managed Care – PPO

## 2020-11-21 DIAGNOSIS — M25674 Stiffness of right foot, not elsewhere classified: Secondary | ICD-10-CM

## 2020-11-21 DIAGNOSIS — M25675 Stiffness of left foot, not elsewhere classified: Secondary | ICD-10-CM

## 2020-11-21 DIAGNOSIS — M722 Plantar fascial fibromatosis: Secondary | ICD-10-CM

## 2020-11-21 DIAGNOSIS — M25572 Pain in left ankle and joints of left foot: Secondary | ICD-10-CM | POA: Diagnosis not present

## 2020-11-21 DIAGNOSIS — R262 Difficulty in walking, not elsewhere classified: Secondary | ICD-10-CM

## 2020-11-21 NOTE — Therapy (Signed)
[place], [place], [address] Phone: [phone]   Fax:  [phone]  Physical Therapy Treatment  Patient Details  Name: Jacqueline Holmes MRN: [id number] Date of Birth: 1971-10-20 Referring Provider (PT): Lanae Crumbly, DPM   Encounter Date: 11/21/2020   PT End of Session - 11/21/20 1737    Visit Number 2    Number of Visits 12    Date for PT Re-Evaluation 12/13/20    Authorization Type BCBS    PT Start Time 1634    PT Stop Time 6761    PT Time Calculation (min) 41 min    Activity Tolerance Patient tolerated treatment well    Behavior During Therapy [facility] for tasks assessed/performed           Past Medical History:  Diagnosis Date  . Abnormal uterine bleeding (AUB)    march/april 2021  . Angio-edema   . Asthma   . Cough    non prod  . Diabetes mellitus arising in pregnancy    now pre-diabetes  . Family history of breast cancer    MGM  . Family history of pancreatic cancer   . Family history of prostate cancer   . Fatigue   . Generalized headaches    migraines on occasion.  Berenice Primas disease 03/2010  . History of COVID-19 06/26/2020  . Hyperlipidemia   . Hypothyroidism    s/p thyroidectomy for Grave's disease (Dr. Tye Savoy at [facility]  . IBS (irritable bowel syndrome)   . Incontinence   . Infertility, female   . Migraine    without aura  . Plantar fasciitis, bilateral 2021  . Pre-diabetes   . Recurrent upper respiratory infection (URI)    last uri dec 2020  . Sinus problem    runny nose  . Sleep apnea    mild no cpap needed   . Uterine polyp     Past Surgical History:  Procedure Laterality Date  . ABDOMINOPLASTY  12/2015   Dr. Towanda Malkin  . BREAST REDUCTION SURGERY  11/03/2018  . BREAST SURGERY  10/2018   breast reduction  . CESAREAN SECTION     x 1  . DILATATION & CURETTAGE/HYSTEROSCOPY WITH TRUECLEAR N/A 01/16/2014   Procedure: DILATATION &  CURETTAGE/HYSTEROSCOPY WITH TRUCLEAR;  Surgeon: Marylynn Pearson, MD;  Location: [facility];  Service: Gynecology;  Laterality: N/A;  . LASIK    . prk laser eye surgery Left 08/2019  . ROBOTIC ASSISTED SALPINGO OOPHERECTOMY Bilateral 03/28/2020   Procedure: XI ROBOTIC ASSISTED SALPINGO OOPHORECTOMY;;  Surgeon: Lafonda Mosses, MD;  Location: [facility];  Service: Gynecology;  Laterality: Bilateral;  . skin cancer remove  06/2020  . TOTAL THYROIDECTOMY  07/30/11   Dr. Harlow Asa (Grave's disease)  . VULVA Milagros Loll BIOPSY N/A 01/16/2014   Procedure: VULVAR BIOPSY;  Surgeon: Marylynn Pearson, MD;  Location: [facility];  Service: Gynecology;  Laterality: N/A;    There were no vitals filed for this visit.   Subjective Assessment - 11/21/20 1635    Subjective She reports the feet are feeling about the same. She went ice skating and fell on her Lt knee and it's still pretty sore and has been taking it easy. She reports pain can fluctuate all over plantar aspect of both feet.    Limitations Standing;Walking    How long can you stand comfortably? 10 min,  in past 20 min    How long can you walk comfortably? max depends on day  but  not sure.   prior she was able to walk for an hour.    Diagnostic tests xrays : spurs    Patient Stated Goals She wants to  have less pain.    Currently in Pain? Yes    Pain Score 2     Pain Location Foot    Pain Orientation Right;Left    Pain Descriptors / Indicators Aching    Pain Type Chronic pain    Pain Onset More than a month ago    Pain Frequency Constant    Multiple Pain Sites No              [facility] PT Assessment - 11/21/20 0001      AROM   Right Ankle Dorsiflexion 90    Left Ankle Dorsiflexion 92                         [facility] Adult PT Treatment/Exercise - 11/21/20 0001      Self-Care   Other Self-Care Comments  see patient education      Manual Therapy   Manual therapy comments STM/DTM to bilateral plantar fascia,  gastroc/soleus, passive gastroc/soleus stretch, great toe flexor stretch      Ankle Exercises: Stretches   Plantar Fascia Stretch Limitations STM with tennis ball    Soleus Stretch Limitations 90 sec each; supine and standing    Gastroc Stretch Limitations 90 sec each; supine and standing    Other Stretch great toe stretch 90 sec each      Ankle Exercises: Seated   Other Seated Ankle Exercises arch lifts 2 x 10                  PT Education - 11/21/20 1736    Education Details Updated HEP.    Person(s) Educated Patient    Methods Explanation;Demonstration;Handout    Comprehension Verbalized understanding;Returned demonstration            PT Short Term Goals - 11/04/20 1619      PT SHORT TERM GOAL #1   Title she will be indpendent with initial HEp    Time 3    Period Weeks    Status New      PT SHORT TERM GOAL #2   Title FOTO score improved 10 points    Time 3    Period Weeks    Status New      PT SHORT TERM GOAL #3   Title She willl report able to stand for > 10 min before incr pain    Time 3    Period Weeks    Status New      PT SHORT TERM GOAL #4   Title Active DF will incr to 95 degrees bilaterally    Time 3    Period Weeks    Status New             PT Long Term Goals - 11/04/20 1621      PT LONG TERM GOAL #1   Title She will be indpendent with all HEP issued    Time 6    Period Weeks    Status New      PT LONG TERM GOAL #2   Title FOTO score will improve to 67% or better    Time 6    Period Weeks    Status New      PT LONG TERM GOAL #3   Title Her pain will be intermittant and  only with walking 1/2 mile or more    Time 6    Period Weeks    Status New      PT LONG TERM GOAL #4   Title Active DF of each foot to 100 degreees to decr tension with walking to decr pain.    Time 6    Period Weeks    Status New      PT LONG TERM GOAL #5   Title She will e able to walk into work with no pain    Time 6    Period Weeks    Status New                  Plan - 11/21/20 1652    Clinical Impression Statement Moderate tautness and palpable tenderness about bilateral plantar fascia and gastroc/soleus complex with partial release from manual therapy. Patient continues to demonstrate significant restriction in active dorsiflexion ROM bilaterally. Overall good tolerance to light progression of stretching and foot intrinsic strengthening without reports of pain.    Personal Factors and Comorbidities Time since onset of injury/illness/exacerbation;Past/Current Experience    Examination-Activity Limitations Stairs;Locomotion Level;Stand    Examination-Participation Restrictions Community Activity;Occupation    Stability/Clinical Decision Making Stable/Uncomplicated    Rehab Potential Good    PT Frequency 2x / week    PT Duration 6 weeks    PT Treatment/Interventions Taping;Passive range of motion;Manual techniques;Patient/family education;Therapeutic activities;Therapeutic exercise;Cryotherapy;Moist Heat;Ultrasound;Iontophoresis 4mg /ml Dexamethasone    PT Next Visit Plan Review HEP. Continue manual therapy. Consider DN to gastroc/soleus, progress foot intrinsic strengthening, static balance activity.    PT Home Exercise Plan Access Code [email or web address]    Consulted and Agree with Plan of Care Patient           Patient will benefit from skilled therapeutic intervention in order to improve the following deficits and impairments:  Pain,Difficulty walking,Decreased range of motion,Decreased activity tolerance  Visit Diagnosis: Pain in left ankle and joints of left foot  Plantar fasciitis of left foot  Difficulty in walking, not elsewhere classified  Stiffness of right foot, not elsewhere classified  Stiffness of left foot, not elsewhere classified  Plantar fasciitis of right foot     Problem List Patient Active Problem List   Diagnosis Date Noted  . Elevated blood pressure reading in office with diagnosis of hypertension  10/22/2020  . Belching 10/22/2020  . Nausea 10/22/2020  . Abdominal pain 10/22/2020  . Gastroesophageal reflux disease 10/22/2020  . Early satiety 10/22/2020  . Seasonal and perennial allergic rhinitis 08/23/2020  . Genetic testing 03/05/2020  . Family history of prostate cancer   . Family history of pancreatic cancer   . Cysts of both ovaries 02/09/2020  . Allergic conjunctivitis of both eyes 10/06/2019  . Elevated ALT measurement 01/28/2018  . Moderate persistent asthma with acute exacerbation 01/18/2018  . Allergic conjunctivitis 01/18/2018  . Elevated blood-pressure reading without diagnosis of hypertension 01/18/2018  . Non-seasonal allergic rhinitis due to pollen 05/03/2017  . Mild persistent asthma, uncomplicated 38/25/0539  . Thyroid nodule, uninodular 09/28/2011  . Unspecified hypothyroidism 09/10/2011  . Allergic rhinitis 09/10/2011  . Asthma 04/25/2008  . Contact dermatitis and other eczema due to plants (except food) 08/04/2007   Gwendolyn Grant, PT, DPT, ATC 11/21/20 5:42 PM [place] [facility] [address] [place], [place], [address] Phone: [phone]   Fax:  [phone]  Name: Jacqueline Holmes MRN: [id number] Date of Birth: [birth year]

## 2020-11-26 ENCOUNTER — Other Ambulatory Visit: Payer: Self-pay

## 2020-11-26 ENCOUNTER — Ambulatory Visit: Payer: BC Managed Care – PPO

## 2020-11-26 DIAGNOSIS — M25674 Stiffness of right foot, not elsewhere classified: Secondary | ICD-10-CM

## 2020-11-26 DIAGNOSIS — M25675 Stiffness of left foot, not elsewhere classified: Secondary | ICD-10-CM

## 2020-11-26 DIAGNOSIS — M25572 Pain in left ankle and joints of left foot: Secondary | ICD-10-CM

## 2020-11-26 DIAGNOSIS — R262 Difficulty in walking, not elsewhere classified: Secondary | ICD-10-CM

## 2020-11-26 DIAGNOSIS — M722 Plantar fascial fibromatosis: Secondary | ICD-10-CM

## 2020-11-26 NOTE — Therapy (Signed)
[facility] Outpatient Rehabilitation [facility] [address] [place], [place], [address] Phone: [phone]   Fax:  [phone]  Physical Therapy Treatment  Patient Details  Name: Jacqueline Holmes MRN: [id number] Date of Birth: [birth year] Referring Provider (PT): Sharl Ma, DPM   Encounter Date: 11/26/2020   PT End of Session - 11/26/20 1717    Visit Number 3    Number of Visits 12    Date for PT Re-Evaluation 12/13/20    Authorization Type BCBS    PT Start Time 1630    PT Stop Time 1715    PT Time Calculation (min) 45 min    Activity Tolerance Patient tolerated treatment well    Behavior During Therapy [facility] for tasks assessed/performed           Past Medical History:  Diagnosis Date  . Abnormal uterine bleeding (AUB)    march/april 2021  . Angio-edema   . Asthma   . Cough    non prod  . Diabetes mellitus arising in pregnancy    now pre-diabetes  . Family history of breast cancer    MGM  . Family history of pancreatic cancer   . Family history of prostate cancer   . Fatigue   . Generalized headaches    migraines on occasion.  Luiz Blare disease 03/2010  . History of COVID-19 06/26/2020  . Hyperlipidemia   . Hypothyroidism    s/p thyroidectomy for Grave's disease (Dr. Lelon Perla at [facility])  . IBS (irritable bowel syndrome)   . Incontinence   . Infertility, female   . Migraine    without aura  . Plantar fasciitis, bilateral 2021  . Pre-diabetes   . Recurrent upper respiratory infection (URI)    last uri dec 2020  . Sinus problem    runny nose  . Sleep apnea    mild no cpap needed   . Uterine polyp     Past Surgical History:  Procedure Laterality Date  . ABDOMINOPLASTY  12/2015   Dr. Shon Hough  . BREAST REDUCTION SURGERY  11/03/2018  . BREAST SURGERY  10/2018   breast reduction  . CESAREAN SECTION     x 1  . DILATATION & CURETTAGE/HYSTEROSCOPY WITH TRUECLEAR N/A 01/16/2014   Procedure: DILATATION &  CURETTAGE/HYSTEROSCOPY WITH TRUCLEAR;  Surgeon: Zelphia Cairo, MD;  Location: [facility];  Service: Gynecology;  Laterality: N/A;  . LASIK    . prk laser eye surgery Left 08/2019  . ROBOTIC ASSISTED SALPINGO OOPHERECTOMY Bilateral 03/28/2020   Procedure: XI ROBOTIC ASSISTED SALPINGO OOPHORECTOMY;;  Surgeon: Carver Fila, MD;  Location: [facility];  Service: Gynecology;  Laterality: Bilateral;  . skin cancer remove  06/2020  . TOTAL THYROIDECTOMY  07/30/11   Dr. Gerrit Friends (Grave's disease)  . VULVA Ples Specter BIOPSY N/A 01/16/2014   Procedure: VULVAR BIOPSY;  Surgeon: Zelphia Cairo, MD;  Location: [facility];  Service: Gynecology;  Laterality: N/A;    There were no vitals filed for this visit.   Subjective Assessment - 11/26/20 1631    Subjective Patient reports her feet are feeling about the same. Her Lt knee is still a little achy. She reports she felt a little looser after last session. She denies any soreness/pain after last session and reports compliance with HEP.    Diagnostic tests xrays : spurs    Currently in Pain? No/denies  [place] Adult PT Treatment/Exercise - 11/26/20 0001      Self-Care   Other Self-Care Comments  see patient education      Lumbar Exercises: Supine   Clam Limitations 2 x 15 blue TB      Knee/Hip Exercises: Stretches   Passive Hamstring Stretch Limitations seated 30 sec each    Quad Stretch Limitations prone 30 sec each      Manual Therapy   Manual therapy comments STM/DTM to bilateral plantar fascia, gastroc/soleus, passive calf stretch bilaterally      Ankle Exercises: Stretches   Set designer --   90 sec each gastroc/soleus     Ankle Exercises: Standing   Other Standing Ankle Exercises tandem 3 x 30 sec each; rhomberg EC on foam 3 x 30 sec                  PT Education - 11/26/20 1737    Education Details Updated HEP.    Person(s) Educated Patient    Methods  Explanation;Demonstration;Handout    Comprehension Verbalized understanding;Returned demonstration            PT Short Term Goals - 11/04/20 1619      PT SHORT TERM GOAL #1   Title she will be indpendent with initial HEp    Time 3    Period Weeks    Status New      PT SHORT TERM GOAL #2   Title FOTO score improved 10 points    Time 3    Period Weeks    Status New      PT SHORT TERM GOAL #3   Title She willl report able to stand for > 10 min before incr pain    Time 3    Period Weeks    Status New      PT SHORT TERM GOAL #4   Title Active DF will incr to 95 degrees bilaterally    Time 3    Period Weeks    Status New             PT Long Term Goals - 11/04/20 1621      PT LONG TERM GOAL #1   Title She will be indpendent with all HEP issued    Time 6    Period Weeks    Status New      PT LONG TERM GOAL #2   Title FOTO score will improve to 67% or better    Time 6    Period Weeks    Status New      PT LONG TERM GOAL #3   Title Her pain will be intermittant and only with walking 1/2 mile or more    Time 6    Period Weeks    Status New      PT LONG TERM GOAL #4   Title Active DF of each foot to 100 degreees to decr tension with walking to decr pain.    Time 6    Period Weeks    Status New      PT LONG TERM GOAL #5   Title She will e able to walk into work with no pain    Time 6    Period Weeks    Status New                 Plan - 11/26/20 1706    Clinical Impression Statement Patient tolerated manual therapy of bilateral plantar fascia and gastroc/soleus complex well,  though has notable trigger points throughout gastroc with partial release from manual therapy. She will potentially benefit from dry needling at future sessions if trigger points remain. Plan updated below to reflect addition of dry needling to POC.  Introduction to static balance activity with patient demonstrating fair postural control during tandem and rhomberg balance without  reports of pain. Incorporated hip and knee strengthening/stretches in order to improve stability about the chain necessary for progression of standing activity.    Personal Factors and Comorbidities Time since onset of injury/illness/exacerbation;Past/Current Experience    Examination-Activity Limitations Stairs;Locomotion Level;Stand    Examination-Participation Restrictions Community Activity;Occupation    Stability/Clinical Decision Making Stable/Uncomplicated    Rehab Potential Good    PT Frequency 2x / week    PT Duration 6 weeks    PT Treatment/Interventions Taping;Passive range of motion;Manual techniques;Patient/family education;Therapeutic activities;Therapeutic exercise;Cryotherapy;Moist Heat;Ultrasound;Iontophoresis 4mg /ml Dexamethasone;Dry needling    PT Next Visit Plan Manual therapy as tolerated. Consider DN to gastroc/soleus, progress foot intrinsic strengthening, static balance activity. progress hip strengthening    PT Home Exercise Plan Access Code [email or web address]    Consulted and Agree with Plan of Care Patient           Patient will benefit from skilled therapeutic intervention in order to improve the following deficits and impairments:  Pain,Difficulty walking,Decreased range of motion,Decreased activity tolerance  Visit Diagnosis: Pain in left ankle and joints of left foot  Plantar fasciitis of left foot  Difficulty in walking, not elsewhere classified  Stiffness of right foot, not elsewhere classified  Stiffness of left foot, not elsewhere classified  Plantar fasciitis of right foot     Problem List Patient Active Problem List   Diagnosis Date Noted  . Elevated blood pressure reading in office with diagnosis of hypertension 10/22/2020  . Belching 10/22/2020  . Nausea 10/22/2020  . Abdominal pain 10/22/2020  . Gastroesophageal reflux disease 10/22/2020  . Early satiety 10/22/2020  . Seasonal and perennial allergic rhinitis 08/23/2020  . Genetic testing  03/05/2020  . Family history of prostate cancer   . Family history of pancreatic cancer   . Cysts of both ovaries 02/09/2020  . Allergic conjunctivitis of both eyes 10/06/2019  . Elevated ALT measurement 01/28/2018  . Moderate persistent asthma with acute exacerbation 01/18/2018  . Allergic conjunctivitis 01/18/2018  . Elevated blood-pressure reading without diagnosis of hypertension 01/18/2018  . Non-seasonal allergic rhinitis due to pollen 05/03/2017  . Mild persistent asthma, uncomplicated [id number]  . Thyroid nodule, uninodular 09/28/2011  . Unspecified hypothyroidism 09/10/2011  . Allergic rhinitis 09/10/2011  . Asthma 04/25/2008  . Contact dermatitis and other eczema due to plants (except food) 08/04/2007   Gwendolyn Grant, PT, DPT, ATC 11/26/20 5:39 PM  [facility] [address] [place], [place], [address] Phone: [phone]   Fax:  [phone]  Name: Shaine Merle MRN: [id number] Date of Birth: Oct 25, 1971

## 2020-11-28 ENCOUNTER — Ambulatory Visit: Payer: BC Managed Care – PPO

## 2020-11-28 ENCOUNTER — Other Ambulatory Visit: Payer: Self-pay

## 2020-11-28 DIAGNOSIS — M25675 Stiffness of left foot, not elsewhere classified: Secondary | ICD-10-CM

## 2020-11-28 DIAGNOSIS — M25572 Pain in left ankle and joints of left foot: Secondary | ICD-10-CM | POA: Diagnosis not present

## 2020-11-28 DIAGNOSIS — M25674 Stiffness of right foot, not elsewhere classified: Secondary | ICD-10-CM

## 2020-11-28 DIAGNOSIS — M722 Plantar fascial fibromatosis: Secondary | ICD-10-CM

## 2020-11-28 DIAGNOSIS — R262 Difficulty in walking, not elsewhere classified: Secondary | ICD-10-CM

## 2020-11-28 NOTE — Therapy (Signed)
[place], [place], [address] Phone: [phone]   Fax:  [phone]  Physical Therapy Treatment  Patient Details  Name: Jacqueline Holmes MRN: [id number] Date of Birth: 1971-03-26 Referring Provider (PT): Lanae Crumbly, DPM   Encounter Date: 11/28/2020   PT End of Session - 11/28/20 1642    Visit Number 4    Number of Visits 12    Date for PT Re-Evaluation 12/13/20    Authorization Type BCBS    PT Start Time 1632    PT Stop Time 1714    PT Time Calculation (min) 42 min    Activity Tolerance Patient tolerated treatment well    Behavior During Therapy [facility] for tasks assessed/performed           Past Medical History:  Diagnosis Date  . Abnormal uterine bleeding (AUB)    march/april 2021  . Angio-edema   . Asthma   . Cough    non prod  . Diabetes mellitus arising in pregnancy    now pre-diabetes  . Family history of breast cancer    MGM  . Family history of pancreatic cancer   . Family history of prostate cancer   . Fatigue   . Generalized headaches    migraines on occasion.  Berenice Primas disease 03/2010  . History of COVID-19 06/26/2020  . Hyperlipidemia   . Hypothyroidism    s/p thyroidectomy for Grave's disease ([facility]. Tye Savoy at [facility])  . IBS (irritable bowel syndrome)   . Incontinence   . Infertility, female   . Migraine    without aura  . Plantar fasciitis, bilateral 2021  . Pre-diabetes   . Recurrent upper respiratory infection (URI)    last uri dec 2020  . Sinus problem    runny nose  . Sleep apnea    mild no cpap needed   . Uterine polyp     Past Surgical History:  Procedure Laterality Date  . ABDOMINOPLASTY  12/2015   Dr. Towanda Malkin  . BREAST REDUCTION SURGERY  11/03/2018  . BREAST SURGERY  10/2018   breast reduction  . CESAREAN SECTION     x 1  . DILATATION & CURETTAGE/HYSTEROSCOPY WITH TRUECLEAR N/A 01/16/2014   Procedure: DILATATION &  CURETTAGE/HYSTEROSCOPY WITH TRUCLEAR;  Surgeon: Marylynn Pearson, MD;  Location: [facility];  Service: Gynecology;  Laterality: N/A;  . LASIK    . prk laser eye surgery Left 08/2019  . ROBOTIC ASSISTED SALPINGO OOPHERECTOMY Bilateral 03/28/2020   Procedure: XI ROBOTIC ASSISTED SALPINGO OOPHORECTOMY;;  Surgeon: Lafonda Mosses, MD;  Location: [facility];  Service: Gynecology;  Laterality: Bilateral;  . skin cancer remove  06/2020  . TOTAL THYROIDECTOMY  07/30/11   Dr. Harlow Asa (Grave's disease)  . VULVA Milagros Loll BIOPSY N/A 01/16/2014   Procedure: VULVAR BIOPSY;  Surgeon: Marylynn Pearson, MD;  Location: [facility];  Service: Gynecology;  Laterality: N/A;    There were no vitals filed for this visit.   Subjective Assessment - 11/28/20 1634    Subjective Patient denies any pain currently, but reports she has been mostly sitting. She reports soreness in the back of her hips after last session that did not last long. She reports compliance with HEP.              [facility] PT Assessment - 11/28/20 0001      AROM   Right Ankle Dorsiflexion 5    Left Ankle Dorsiflexion 6  [facility] Adult PT Treatment/Exercise - 11/28/20 0001      Lumbar Exercises: Sidelying   Hip Abduction Limitations 2 x 10 bil      Knee/Hip Exercises: Stretches   Gastroc Stretch Limitations 90 sec supine      Manual Therapy   Manual therapy comments STM/DTM to bilateral gastroc/soleus and plantar fascia            Trigger Point Dry Needling - 11/28/20 0001    Consent Given? Yes    Education Handout Provided Yes    Muscles Treated Lower Quadrant Gastrocnemius    Dry Needling Comments Performed by Lorayne Bender    Gastrocnemius Response Twitch response elicited;Palpable increased muscle length                PT Education - 11/28/20 1648    Education Details Education on dry needling indications, expectations, side effects. Verbal consent obtained.    Person(s)  Educated Patient    Methods Explanation    Comprehension Verbalized understanding            PT Short Term Goals - 11/04/20 1619      PT SHORT TERM GOAL #1   Title she will be indpendent with initial HEp    Time 3    Period Weeks    Status New      PT SHORT TERM GOAL #2   Title FOTO score improved 10 points    Time 3    Period Weeks    Status New      PT SHORT TERM GOAL #3   Title She willl report able to stand for > 10 min before incr pain    Time 3    Period Weeks    Status New      PT SHORT TERM GOAL #4   Title Active DF will incr to 95 degrees bilaterally    Time 3    Period Weeks    Status New             PT Long Term Goals - 11/04/20 1621      PT LONG TERM GOAL #1   Title She will be indpendent with all HEP issued    Time 6    Period Weeks    Status New      PT LONG TERM GOAL #2   Title FOTO score will improve to 67% or better    Time 6    Period Weeks    Status New      PT LONG TERM GOAL #3   Title Her pain will be intermittant and only with walking 1/2 mile or more    Time 6    Period Weeks    Status New      PT LONG TERM GOAL #4   Title Active DF of each foot to 100 degreees to decr tension with walking to decr pain.    Time 6    Period Weeks    Status New      PT LONG TERM GOAL #5   Title She will e able to walk into work with no pain    Time 6    Period Weeks    Status New                 Plan - 11/28/20 1645    Clinical Impression Statement Sound twitch response to TPDN in bilateral gastroc with patient reporting minimal soreness post intervention. Patient's dorsifleixon AROM is gradually improving compared to  initial evaluation, though she still lacks functional ROM. Good tolerance to light progression of hip strengthening, though challenged in maintaining pelvic stability.    Personal Factors and Comorbidities Time since onset of injury/illness/exacerbation;Past/Current Experience    Examination-Activity Limitations  Stairs;Locomotion Level;Stand    Examination-Participation Restrictions Community Activity;Occupation    Stability/Clinical Decision Making Stable/Uncomplicated    Rehab Potential Good    PT Frequency 2x / week    PT Duration 6 weeks    PT Treatment/Interventions Taping;Passive range of motion;Manual techniques;Patient/family education;Therapeutic activities;Therapeutic exercise;Cryotherapy;Moist Heat;Ultrasound;Iontophoresis 4mg /ml Dexamethasone;Dry needling    PT Next Visit Plan Manual therapy as tolerated. Consider DN to foot intrinsic musculature. Assess response to DN. Progress balance and LE strength as tolerated.    PT Home Exercise Plan Access Code [email or web address]    Consulted and Agree with Plan of Care Patient           Patient will benefit from skilled therapeutic intervention in order to improve the following deficits and impairments:  Pain,Difficulty walking,Decreased range of motion,Decreased activity tolerance  Visit Diagnosis: Pain in left ankle and joints of left foot  Plantar fasciitis of left foot  Difficulty in walking, not elsewhere classified  Stiffness of right foot, not elsewhere classified  Stiffness of left foot, not elsewhere classified  Plantar fasciitis of right foot     Problem List Patient Active Problem List   Diagnosis Date Noted  . Elevated blood pressure reading in office with diagnosis of hypertension 10/22/2020  . Belching 10/22/2020  . Nausea 10/22/2020  . Abdominal pain 10/22/2020  . Gastroesophageal reflux disease 10/22/2020  . Early satiety 10/22/2020  . Seasonal and perennial allergic rhinitis 08/23/2020  . Genetic testing 03/05/2020  . Family history of prostate cancer   . Family history of pancreatic cancer   . Cysts of both ovaries 02/09/2020  . Allergic conjunctivitis of both eyes 10/06/2019  . Elevated ALT measurement 01/28/2018  . Moderate persistent asthma with acute exacerbation 01/18/2018  . Allergic conjunctivitis  01/18/2018  . Elevated blood-pressure reading without diagnosis of hypertension 01/18/2018  . Non-seasonal allergic rhinitis due to pollen 05/03/2017  . Mild persistent asthma, uncomplicated [id number]  . Thyroid nodule, uninodular 09/28/2011  . Unspecified hypothyroidism 09/10/2011  . Allergic rhinitis 09/10/2011  . Asthma 04/25/2008  . Contact dermatitis and other eczema due to plants (except food) 08/04/2007   Gwendolyn Grant, PT, DPT, ATC 11/28/20 5:15 PM  [facility] Outpatient Rehabilitation [facility] [address] [place], [place], [address] Phone: [phone]   Fax:  [phone]  Name: Jacqueline Holmes MRN: [id number] Date of Birth: 04/23/1971

## 2020-12-02 ENCOUNTER — Ambulatory Visit (AMBULATORY_SURGERY_CENTER): Payer: Self-pay | Admitting: *Deleted

## 2020-12-02 ENCOUNTER — Other Ambulatory Visit: Payer: Self-pay

## 2020-12-02 VITALS — Ht 61.0 in | Wt 174.0 lb

## 2020-12-02 DIAGNOSIS — Z1211 Encounter for screening for malignant neoplasm of colon: Secondary | ICD-10-CM

## 2020-12-02 MED ORDER — PLENVU 140 G PO SOLR
1.0000 | ORAL | 0 refills | Status: DC
Start: 2020-12-02 — End: 2020-12-24

## 2020-12-02 NOTE — Progress Notes (Signed)
No egg or soy allergy known to patient  No issues with past sedation with any surgeries or procedures No intubation problems in the past  No FH of Malignant Hyperthermia No diet pills per patient No home 02 use per patient  No blood thinners per patient  Pt denies issues with constipation  No A fib or A flutter  EMMI video to pt or via MyChart  COVID 19 guidelines implemented in [facility] today with Pt and RN  Pt is fully vaccinated  for Covid   Plenvu  Coupon given to pt in PV today , Code to Pharmacy   Due to the COVID-19 pandemic we are asking patients to follow certain guidelines.  Pt aware of COVID protocols and [facility] guidelines   

## 2020-12-03 ENCOUNTER — Ambulatory Visit: Payer: BC Managed Care – PPO | Attending: Podiatry

## 2020-12-03 DIAGNOSIS — R262 Difficulty in walking, not elsewhere classified: Secondary | ICD-10-CM | POA: Diagnosis present

## 2020-12-03 DIAGNOSIS — M25675 Stiffness of left foot, not elsewhere classified: Secondary | ICD-10-CM | POA: Insufficient documentation

## 2020-12-03 DIAGNOSIS — M25674 Stiffness of right foot, not elsewhere classified: Secondary | ICD-10-CM | POA: Diagnosis present

## 2020-12-03 DIAGNOSIS — M25572 Pain in left ankle and joints of left foot: Secondary | ICD-10-CM | POA: Insufficient documentation

## 2020-12-03 DIAGNOSIS — M722 Plantar fascial fibromatosis: Secondary | ICD-10-CM | POA: Insufficient documentation

## 2020-12-03 NOTE — Therapy (Signed)
[facility] [address] [place], [place], [address] Phone: [phone]   Fax:  [phone]  Physical Therapy Treatment  Patient Details  Name: Jacqueline Holmes MRN: [id number] Date of Birth: [birth year] Referring Provider (PT): Sharl Ma, DPM   Encounter Date: 12/03/2020   PT End of Session - 12/03/20 1657    Visit Number 5    Number of Visits 12    Date for PT Re-Evaluation 12/13/20    Authorization Type BCBS    PT Start Time 1632    PT Stop Time 1714    PT Time Calculation (min) 42 min    Activity Tolerance Patient tolerated treatment well    Behavior During Therapy [facility] for tasks assessed/performed           Past Medical History:  Diagnosis Date  . Abnormal uterine bleeding (AUB)    march/april 2021  . Allergy   . Angio-edema   . Asthma   . Cancer (HCC)    skin cancer removed   . Cough    non prod  . Diabetes mellitus arising in pregnancy    now pre-diabetes  . Elevated BP without diagnosis of hypertension    high with DOCTOR  visits , then goes back to normal   . Family history of breast cancer    MGM  . Family history of pancreatic cancer   . Family history of prostate cancer   . Fatigue   . Generalized headaches    migraines on occasion.  Marland Kitchen GERD (gastroesophageal reflux disease)   . Graves disease 03/2010  . Heart murmur   . History of COVID-19 06/26/2020  . Hyperlipidemia   . Hypothyroidism    s/p thyroidectomy for Grave's disease (Dr. Lelon Perla at [facility])  . IBS (irritable bowel syndrome)   . Incontinence   . Infertility, female   . Migraine    without aura  . Plantar fasciitis, bilateral 2021  . Pre-diabetes   . Recurrent upper respiratory infection (URI)    last uri dec 2020  . Sinus problem    runny nose  . Sleep apnea    mild no cpap needed   . Uterine polyp     Past Surgical History:  Procedure Laterality Date  . ABDOMINOPLASTY  12/2015   Dr. Shon Hough  . BREAST  REDUCTION SURGERY  11/03/2018  . BREAST SURGERY  10/2018   breast reduction  . CESAREAN SECTION     x 1  . DILATATION & CURETTAGE/HYSTEROSCOPY WITH TRUECLEAR N/A 01/16/2014   Procedure: DILATATION & CURETTAGE/HYSTEROSCOPY WITH TRUCLEAR;  Surgeon: Zelphia Cairo, MD;  Location: [facility];  Service: Gynecology;  Laterality: N/A;  . LASIK    . prk laser eye surgery Left 08/2019  . ROBOTIC ASSISTED SALPINGO OOPHERECTOMY Bilateral 03/28/2020   Procedure: XI ROBOTIC ASSISTED SALPINGO OOPHORECTOMY;;  Surgeon: Carver Fila, MD;  Location: [facility];  Service: Gynecology;  Laterality: Bilateral;  . skin cancer remove  06/2020  . TOTAL THYROIDECTOMY  07/30/11   Dr. Gerrit Friends (Grave's disease)  . VULVA Ples Specter BIOPSY N/A 01/16/2014   Procedure: VULVAR BIOPSY;  Surgeon: Zelphia Cairo, MD;  Location: [facility];  Service: Gynecology;  Laterality: N/A;    There were no vitals filed for this visit.   Subjective Assessment - 12/03/20 1632    Subjective Patient reports she is doing ok and her feet have been feeling a lot better, but are still tight. She feels the TPDN might have helped with her  pain and she wasn't sore after TPDN at last session. She reports compliance with HEP.    Currently in Pain? No/denies              [facility] PT Assessment - 12/03/20 0001      AROM   Right Ankle Dorsiflexion 8    Left Ankle Dorsiflexion 8                         [facility] Adult PT Treatment/Exercise - 12/03/20 0001      Lumbar Exercises: Sidelying   Clam Limitations 2 x 10 black TB bilaterally      Manual Therapy   Manual therapy comments STM/DTM to bilateral gastroc/soleus and plantar fascia, passive stetch of gastroc/soleus and great toe flexors      Ankle Exercises: Standing   Other Standing Ankle Exercises tandem on airex 3 x 30 sec each    Other Standing Ankle Exercises SLS 3 x 20 sec each      Ankle Exercises: Machines for Strengthening   Cybex Leg Press 2 x 10 40lbs             Trigger Point Dry Needling - 12/03/20 0001    Consent Given? Yes    Education Handout Provided Previously provided    Muscles Treated Lower Quadrant Quadratus plantae    Dry Needling Comments Performed by Voncille Lo PT    Gastrocnemius Response Twitch response elicited;Palpable increased muscle length                PT Education - 12/03/20 1715    Education Details Updated HEP.    Person(s) Educated Patient    Methods Explanation;Handout    Comprehension Verbalized understanding;Returned demonstration            PT Short Term Goals - 11/04/20 1619      PT SHORT TERM GOAL #1   Title she will be indpendent with initial HEp    Time 3    Period Weeks    Status New      PT SHORT TERM GOAL #2   Title FOTO score improved 10 points    Time 3    Period Weeks    Status New      PT SHORT TERM GOAL #3   Title She willl report able to stand for > 10 min before incr pain    Time 3    Period Weeks    Status New      PT SHORT TERM GOAL #4   Title Active DF will incr to 95 degrees bilaterally    Time 3    Period Weeks    Status New             PT Long Term Goals - 11/04/20 1621      PT LONG TERM GOAL #1   Title She will be indpendent with all HEP issued    Time 6    Period Weeks    Status New      PT LONG TERM GOAL #2   Title FOTO score will improve to 67% or better    Time 6    Period Weeks    Status New      PT LONG TERM GOAL #3   Title Her pain will be intermittant and only with walking 1/2 mile or more    Time 6    Period Weeks    Status New      PT  LONG TERM GOAL #4   Title Active DF of each foot to 100 degreees to decr tension with walking to decr pain.    Time 6    Period Weeks    Status New      PT LONG TERM GOAL #5   Title She will e able to walk into work with no pain    Time 6    Period Weeks    Status New                 Plan - 12/03/20 1658    Clinical Impression Statement Good tolerance to TPDN to  bilateral quadratus plantea with sound twitch response elicited. Able to progress static balance activity with patient requiring cues to decrease hip drop during SLS with ability to correct once cued. Her DF AROM continues to gradual improve, nearly achieving functional ROM at today's session. Patient reported mild soreness in bilateral plantar fascia at conclusion of session, though denies pain.    Personal Factors and Comorbidities Time since onset of injury/illness/exacerbation;Past/Current Experience    Examination-Activity Limitations Stairs;Locomotion Level;Stand    Examination-Participation Restrictions Community Activity;Occupation    Stability/Clinical Decision Making Stable/Uncomplicated    Rehab Potential Good    PT Frequency 2x / week    PT Duration 6 weeks    PT Treatment/Interventions Taping;Passive range of motion;Manual techniques;Patient/family education;Therapeutic activities;Therapeutic exercise;Cryotherapy;Moist Heat;Ultrasound;Iontophoresis 4mg /ml Dexamethasone;Dry needling    PT Next Visit Plan Assess response to TPDN, Progress static balance, progress LE strength as tolerated.    PT Home Exercise Plan Access Code [email or web address]    Consulted and Agree with Plan of Care Patient           Patient will benefit from skilled therapeutic intervention in order to improve the following deficits and impairments:  Pain,Difficulty walking,Decreased range of motion,Decreased activity tolerance  Visit Diagnosis: Pain in left ankle and joints of left foot  Plantar fasciitis of left foot  Difficulty in walking, not elsewhere classified  Stiffness of right foot, not elsewhere classified  Stiffness of left foot, not elsewhere classified  Plantar fasciitis of right foot     Problem List Patient Active Problem List   Diagnosis Date Noted  . Elevated blood pressure reading in office with diagnosis of hypertension 10/22/2020  . Belching 10/22/2020  . Nausea 10/22/2020  . Abdominal  pain 10/22/2020  . Gastroesophageal reflux disease 10/22/2020  . Early satiety 10/22/2020  . Seasonal and perennial allergic rhinitis 08/23/2020  . Genetic testing 03/05/2020  . Family history of prostate cancer   . Family history of pancreatic cancer   . Cysts of both ovaries 02/09/2020  . Allergic conjunctivitis of both eyes 10/06/2019  . Elevated ALT measurement 01/28/2018  . Moderate persistent asthma with acute exacerbation 01/18/2018  . Allergic conjunctivitis 01/18/2018  . Elevated blood-pressure reading without diagnosis of hypertension 01/18/2018  . Non-seasonal allergic rhinitis due to pollen 05/03/2017  . Mild persistent asthma, uncomplicated [id number]  . Thyroid nodule, uninodular 09/28/2011  . Unspecified hypothyroidism 09/10/2011  . Allergic rhinitis 09/10/2011  . Asthma 04/25/2008  . Contact dermatitis and other eczema due to plants (except food) 08/04/2007   Gwendolyn Grant, PT, DPT, ATC 12/03/20 5:17 PM  [facility] [address] [place], [place], [address] Phone: [phone]   Fax:  [phone]  Name: Jacqueline Holmes MRN: [id number] Date of Birth: 12-19-1970

## 2020-12-04 ENCOUNTER — Other Ambulatory Visit: Payer: Self-pay | Admitting: Family Medicine

## 2020-12-04 ENCOUNTER — Other Ambulatory Visit: Payer: Self-pay

## 2020-12-04 MED ORDER — BUDESONIDE-FORMOTEROL FUMARATE 160-4.5 MCG/ACT IN AERO: INHALATION_SPRAY | RESPIRATORY_TRACT | 2 refills | Status: DC

## 2020-12-05 ENCOUNTER — Ambulatory Visit: Payer: BC Managed Care – PPO

## 2020-12-05 ENCOUNTER — Other Ambulatory Visit: Payer: Self-pay

## 2020-12-05 DIAGNOSIS — M25675 Stiffness of left foot, not elsewhere classified: Secondary | ICD-10-CM

## 2020-12-05 DIAGNOSIS — M722 Plantar fascial fibromatosis: Secondary | ICD-10-CM

## 2020-12-05 DIAGNOSIS — R262 Difficulty in walking, not elsewhere classified: Secondary | ICD-10-CM

## 2020-12-05 DIAGNOSIS — M25572 Pain in left ankle and joints of left foot: Secondary | ICD-10-CM

## 2020-12-05 DIAGNOSIS — M25674 Stiffness of right foot, not elsewhere classified: Secondary | ICD-10-CM

## 2020-12-05 NOTE — Therapy (Signed)
[place], [place], [address] Phone: [phone]   Fax:  [phone]  Physical Therapy Treatment  Patient Details  Name: Jacqueline Holmes MRN: [id number] Date of Birth: [birth year] Referring Provider (PT): Lanae Crumbly, DPM   Encounter Date: 12/05/2020   PT End of Session - 12/05/20 1710    Visit Number 6    Number of Visits 12    Date for PT Re-Evaluation 12/13/20    Authorization Type BCBS    PT Start Time 1633    PT Stop Time 1715    PT Time Calculation (min) 42 min    Activity Tolerance Patient tolerated treatment well    Behavior During Therapy [facility] for tasks assessed/performed           Past Medical History:  Diagnosis Date  . Abnormal uterine bleeding (AUB)    march/april 2021  . Allergy   . Angio-edema   . Asthma   . Cancer ([place])    skin cancer removed   . Cough    non prod  . Diabetes mellitus arising in pregnancy    now pre-diabetes  . Elevated BP without diagnosis of hypertension    high with DOCTOR  visits , then goes back to normal   . Family history of breast cancer    MGM  . Family history of pancreatic cancer   . Family history of prostate cancer   . Fatigue   . Generalized headaches    migraines on occasion.  Marland Kitchen GERD (gastroesophageal reflux disease)   . Graves disease 03/2010  . Heart murmur   . History of COVID-19 06/26/2020  . Hyperlipidemia   . Hypothyroidism    s/p thyroidectomy for Grave's disease (Dr. Tye Savoy at [facility])  . IBS (irritable bowel syndrome)   . Incontinence   . Infertility, female   . Migraine    without aura  . Plantar fasciitis, bilateral 2021  . Pre-diabetes   . Recurrent upper respiratory infection (URI)    last uri dec 2020  . Sinus problem    runny nose  . Sleep apnea    mild no cpap needed   . Uterine polyp     Past Surgical History:  Procedure Laterality Date  . ABDOMINOPLASTY  12/2015   Dr. Towanda Malkin  . BREAST  REDUCTION SURGERY  11/03/2018  . BREAST SURGERY  10/2018   breast reduction  . CESAREAN SECTION     x 1  . DILATATION & CURETTAGE/HYSTEROSCOPY WITH TRUECLEAR N/A 01/16/2014   Procedure: DILATATION & CURETTAGE/HYSTEROSCOPY WITH TRUCLEAR;  Surgeon: Marylynn Pearson, MD;  Location: [facility];  Service: Gynecology;  Laterality: N/A;  . LASIK    . prk laser eye surgery Left 08/2019  . ROBOTIC ASSISTED SALPINGO OOPHERECTOMY Bilateral 03/28/2020   Procedure: XI ROBOTIC ASSISTED SALPINGO OOPHORECTOMY;;  Surgeon: Lafonda Mosses, MD;  Location: [facility];  Service: Gynecology;  Laterality: Bilateral;  . skin cancer remove  06/2020  . TOTAL THYROIDECTOMY  07/30/11   Dr. Harlow Asa (Grave's disease)  . VULVA Milagros Loll BIOPSY N/A 01/16/2014   Procedure: VULVAR BIOPSY;  Surgeon: Marylynn Pearson, MD;  Location: [facility];  Service: Gynecology;  Laterality: N/A;    There were no vitals filed for this visit.   Subjective Assessment - 12/05/20 1637    Subjective patient felt a little sore after last session and is now having an ache in the ball of both feet that this morning. She denies any pain/soreness yesterday.  Patient unsure if TPDN has helped.    Currently in Pain? Yes    Pain Score 3     Pain Location Foot    Pain Orientation Right;Left    Pain Descriptors / Indicators Aching    Pain Type Chronic pain              [facility] PT Assessment - 12/05/20 0001      Observation/Other Assessments   Focus on Therapeutic Outcomes (FOTO)  64% function                         [facility] Adult PT Treatment/Exercise - 12/05/20 0001      Self-Care   Other Self-Care Comments  see patient education      Pilates   Pilates Reformer calf raise, neutral, IR and ER 1 x 10 each      Knee/Hip Exercises: Stretches   Passive Hamstring Stretch Limitations 1 min each    Quad Stretch Limitations 1 min each prone      Manual Therapy   Manual therapy comments STM/DTM to bilateral  gastroc/soleus and plantar fascia, passive stetch of gastroc/soleus and great toe flexors      Ankle Exercises: Aerobic   Stationary Bike 5 minute level 5      Ankle Exercises: Standing   Rocker Board 1 minute                  PT Education - 12/05/20 1728    Education Details FOTO score and progress thus far    Person(s) Educated Patient    Methods Explanation    Comprehension Verbalized understanding            PT Short Term Goals - 11/04/20 1619      PT SHORT TERM GOAL #1   Title she will be indpendent with initial HEp    Time 3    Period Weeks    Status New      PT SHORT TERM GOAL #2   Title FOTO score improved 10 points    Time 3    Period Weeks    Status New      PT SHORT TERM GOAL #3   Title She willl report able to stand for > 10 min before incr pain    Time 3    Period Weeks    Status New      PT SHORT TERM GOAL #4   Title Active DF will incr to 95 degrees bilaterally    Time 3    Period Weeks    Status New             PT Long Term Goals - 11/04/20 1621      PT LONG TERM GOAL #1   Title She will be indpendent with all HEP issued    Time 6    Period Weeks    Status New      PT LONG TERM GOAL #2   Title FOTO score will improve to 67% or better    Time 6    Period Weeks    Status New      PT LONG TERM GOAL #3   Title Her pain will be intermittant and only with walking 1/2 mile or more    Time 6    Period Weeks    Status New      PT LONG TERM GOAL #4   Title Active DF of each foot to 100 degreees  to decr tension with walking to decr pain.    Time 6    Period Weeks    Status New      PT LONG TERM GOAL #5   Title She will e able to walk into work with no pain    Time 6    Period Weeks    Status New                 Plan - 12/05/20 1704    Clinical Impression Statement Patient reports less palpable tenderness about plantar aspect of foot with minimal tenderness about 1st metatarsal head bilaterally. Mild TrPs remain  about gastroc with partial release from manual therapy. Consider TPDN at future sessions if TrPs remain. Strengthening exercises performed on pilates reformer today with patient demonstrating sound control/stability without reports of increased pain. Patient demonstrates significant improvement on FOTO outcome score at tonight's session.    Personal Factors and Comorbidities Time since onset of injury/illness/exacerbation;Past/Current Experience    Examination-Activity Limitations Stairs;Locomotion Level;Stand    Examination-Participation Restrictions Community Activity;Occupation    Stability/Clinical Decision Making Stable/Uncomplicated    Rehab Potential Good    PT Frequency 2x / week    PT Duration 6 weeks    PT Treatment/Interventions Taping;Passive range of motion;Manual techniques;Patient/family education;Therapeutic activities;Therapeutic exercise;Cryotherapy;Moist Heat;Ultrasound;Iontophoresis 4mg /ml Dexamethasone;Dry needling    PT Next Visit Plan TPDN PRN. Progress static balance, progress LE strength as tolerated.    PT Home Exercise Plan Access Code [email or web address]    Consulted and Agree with Plan of Care Patient           Patient will benefit from skilled therapeutic intervention in order to improve the following deficits and impairments:  Pain,Difficulty walking,Decreased range of motion,Decreased activity tolerance  Visit Diagnosis: Pain in left ankle and joints of left foot  Plantar fasciitis of left foot  Difficulty in walking, not elsewhere classified  Stiffness of right foot, not elsewhere classified  Stiffness of left foot, not elsewhere classified  Plantar fasciitis of right foot     Problem List Patient Active Problem List   Diagnosis Date Noted  . Elevated blood pressure reading in office with diagnosis of hypertension 10/22/2020  . Belching 10/22/2020  . Nausea 10/22/2020  . Abdominal pain 10/22/2020  . Gastroesophageal reflux disease 10/22/2020  . Early  satiety 10/22/2020  . Seasonal and perennial allergic rhinitis 08/23/2020  . Genetic testing 03/05/2020  . Family history of prostate cancer   . Family history of pancreatic cancer   . Cysts of both ovaries 02/09/2020  . Allergic conjunctivitis of both eyes 10/06/2019  . Elevated ALT measurement 01/28/2018  . Moderate persistent asthma with acute exacerbation 01/18/2018  . Allergic conjunctivitis 01/18/2018  . Elevated blood-pressure reading without diagnosis of hypertension 01/18/2018  . Non-seasonal allergic rhinitis due to pollen 05/03/2017  . Mild persistent asthma, uncomplicated [id number]  . Thyroid nodule, uninodular 09/28/2011  . Unspecified hypothyroidism 09/10/2011  . Allergic rhinitis 09/10/2011  . Asthma 04/25/2008  . Contact dermatitis and other eczema due to plants (except food) 08/04/2007   Gwendolyn Grant, PT, DPT, ATC 12/05/20 5:29 PM  [facility] [address] [place], [place], [address] Phone: [phone]   Fax:  [phone]  Name: Jacqueline Holmes MRN: [id number] Date of Birth: 1971/04/04

## 2020-12-10 ENCOUNTER — Ambulatory Visit: Payer: BC Managed Care – PPO

## 2020-12-10 ENCOUNTER — Other Ambulatory Visit: Payer: Self-pay

## 2020-12-10 DIAGNOSIS — R262 Difficulty in walking, not elsewhere classified: Secondary | ICD-10-CM

## 2020-12-10 DIAGNOSIS — M25674 Stiffness of right foot, not elsewhere classified: Secondary | ICD-10-CM

## 2020-12-10 DIAGNOSIS — M25675 Stiffness of left foot, not elsewhere classified: Secondary | ICD-10-CM

## 2020-12-10 DIAGNOSIS — M25572 Pain in left ankle and joints of left foot: Secondary | ICD-10-CM

## 2020-12-10 DIAGNOSIS — M722 Plantar fascial fibromatosis: Secondary | ICD-10-CM

## 2020-12-10 NOTE — Therapy (Signed)
[place], [place], [address] Phone: [phone]   Fax:  [phone]  Physical Therapy Treatment  Patient Details  Name: Jacqueline Holmes MRN: [id number] Date of Birth: 07-24-1971 Referring Provider (PT): Lanae Crumbly, DPM   Encounter Date: 12/10/2020   PT End of Session - 12/10/20 1719    Visit Number 7    Number of Visits 12    Date for PT Re-Evaluation 12/13/20    Authorization Type BCBS    PT Start Time 1630    PT Stop Time 2992    PT Time Calculation (min) 45 min    Activity Tolerance Patient tolerated treatment well    Behavior During Therapy [facility] for tasks assessed/performed           Past Medical History:  Diagnosis Date  . Abnormal uterine bleeding (AUB)    march/april 2021  . Allergy   . Angio-edema   . Asthma   . Cancer ([place])    skin cancer removed   . Cough    non prod  . Diabetes mellitus arising in pregnancy    now pre-diabetes  . Elevated BP without diagnosis of hypertension    high with DOCTOR  visits , then goes back to normal   . Family history of breast cancer    MGM  . Family history of pancreatic cancer   . Family history of prostate cancer   . Fatigue   . Generalized headaches    migraines on occasion.  Marland Kitchen GERD (gastroesophageal reflux disease)   . Graves disease 03/2010  . Heart murmur   . History of COVID-19 06/26/2020  . Hyperlipidemia   . Hypothyroidism    s/p thyroidectomy for Grave's disease (Dr. Tye Savoy at [facility])  . IBS (irritable bowel syndrome)   . Incontinence   . Infertility, female   . Migraine    without aura  . Plantar fasciitis, bilateral 2021  . Pre-diabetes   . Recurrent upper respiratory infection (URI)    last uri dec 2020  . Sinus problem    runny nose  . Sleep apnea    mild no cpap needed   . Uterine polyp     Past Surgical History:  Procedure Laterality Date  . ABDOMINOPLASTY  12/2015   Dr. Towanda Malkin  . BREAST  REDUCTION SURGERY  11/03/2018  . BREAST SURGERY  10/2018   breast reduction  . CESAREAN SECTION     x 1  . DILATATION & CURETTAGE/HYSTEROSCOPY WITH TRUECLEAR N/A 01/16/2014   Procedure: DILATATION & CURETTAGE/HYSTEROSCOPY WITH TRUCLEAR;  Surgeon: Marylynn Pearson, MD;  Location: [facility];  Service: Gynecology;  Laterality: N/A;  . LASIK    . prk laser eye surgery Left 08/2019  . ROBOTIC ASSISTED SALPINGO OOPHERECTOMY Bilateral 03/28/2020   Procedure: XI ROBOTIC ASSISTED SALPINGO OOPHORECTOMY;;  Surgeon: Lafonda Mosses, MD;  Location: [facility];  Service: Gynecology;  Laterality: Bilateral;  . skin cancer remove  06/2020  . TOTAL THYROIDECTOMY  07/30/11   Dr. Harlow Asa (Grave's disease)  . VULVA Milagros Loll BIOPSY N/A 01/16/2014   Procedure: VULVAR BIOPSY;  Surgeon: Marylynn Pearson, MD;  Location: [facility];  Service: Gynecology;  Laterality: N/A;    There were no vitals filed for this visit.   Subjective Assessment - 12/10/20 1636    Subjective Patient reports over the weekend she was having significant soreness in the Rt calf when she would go to stand after sitting for awhile. She reports she hasn't  had much pain in the feet since last session. She reports compliance with HEP.    Currently in Pain? No/denies                             [facility] Adult PT Treatment/Exercise - 12/10/20 0001      Self-Care   Other Self-Care Comments  see patient education      Pilates   Pilates Reformer calf raises in neutral and ER; squats 2 x 10, hip bridge 2 x 10      Manual Therapy   Manual therapy comments STM/DTM to bilateral gastroc/soleus and plantar fascia, passive stetch of gastroc/soleus and great toe flexors      Ankle Exercises: Aerobic   Stationary Bike 5 minute level 5      Ankle Exercises: Standing   Other Standing Ankle Exercises standing 3 way hip on airex 1 x 10 each                  PT Education - 12/10/20 1719    Education Details Updated  HEP.    Person(s) Educated Patient    Methods Explanation;Demonstration;Handout    Comprehension Verbalized understanding;Returned demonstration            PT Short Term Goals - 11/04/20 1619      PT SHORT TERM GOAL #1   Title she will be indpendent with initial HEp    Time 3    Period Weeks    Status New      PT SHORT TERM GOAL #2   Title FOTO score improved 10 points    Time 3    Period Weeks    Status New      PT SHORT TERM GOAL #3   Title She willl report able to stand for > 10 min before incr pain    Time 3    Period Weeks    Status New      PT SHORT TERM GOAL #4   Title Active DF will incr to 95 degrees bilaterally    Time 3    Period Weeks    Status New             PT Long Term Goals - 11/04/20 1621      PT LONG TERM GOAL #1   Title She will be indpendent with all HEP issued    Time 6    Period Weeks    Status New      PT LONG TERM GOAL #2   Title FOTO score will improve to 67% or better    Time 6    Period Weeks    Status New      PT LONG TERM GOAL #3   Title Her pain will be intermittant and only with walking 1/2 mile or more    Time 6    Period Weeks    Status New      PT LONG TERM GOAL #4   Title Active DF of each foot to 100 degreees to decr tension with walking to decr pain.    Time 6    Period Weeks    Status New      PT LONG TERM GOAL #5   Title She will e able to walk into work with no pain    Time 6    Period Weeks    Status New  Plan - 12/10/20 1711    Clinical Impression Statement Patient tolerated session well today with progression of LE stability exercises without reports of calf/foot pain. Patient challenged in maintaining balance during SL activity on unstable surface, though no collapsing of arch noted. Mild tautness/trigger points remain in lateral gastroc bilaterally and abductor hallicus and would likely benefit from additional TPDN at future sessions.    Personal Factors and Comorbidities  Time since onset of injury/illness/exacerbation;Past/Current Experience    Examination-Activity Limitations Stairs;Locomotion Level;Stand    Examination-Participation Restrictions Community Activity;Occupation    Stability/Clinical Decision Making Stable/Uncomplicated    Rehab Potential Good    PT Frequency 2x / week    PT Duration 6 weeks    PT Treatment/Interventions Taping;Passive range of motion;Manual techniques;Patient/family education;Therapeutic activities;Therapeutic exercise;Cryotherapy;Moist Heat;Ultrasound;Iontophoresis 4mg /ml Dexamethasone;Dry needling    PT Next Visit Plan TPDN PRN. Progress static balance, progress LE strength as tolerated.    PT Home Exercise Plan Access Code [email or web address]    Consulted and Agree with Plan of Care Patient           Patient will benefit from skilled therapeutic intervention in order to improve the following deficits and impairments:  Pain,Difficulty walking,Decreased range of motion,Decreased activity tolerance  Visit Diagnosis: Pain in left ankle and joints of left foot  Plantar fasciitis of left foot  Difficulty in walking, not elsewhere classified  Stiffness of right foot, not elsewhere classified  Stiffness of left foot, not elsewhere classified  Plantar fasciitis of right foot     Problem List Patient Active Problem List   Diagnosis Date Noted  . Elevated blood pressure reading in office with diagnosis of hypertension 10/22/2020  . Belching 10/22/2020  . Nausea 10/22/2020  . Abdominal pain 10/22/2020  . Gastroesophageal reflux disease 10/22/2020  . Early satiety 10/22/2020  . Seasonal and perennial allergic rhinitis 08/23/2020  . Genetic testing 03/05/2020  . Family history of prostate cancer   . Family history of pancreatic cancer   . Cysts of both ovaries 02/09/2020  . Allergic conjunctivitis of both eyes 10/06/2019  . Elevated ALT measurement 01/28/2018  . Moderate persistent asthma with acute exacerbation 01/18/2018   . Allergic conjunctivitis 01/18/2018  . Elevated blood-pressure reading without diagnosis of hypertension 01/18/2018  . Non-seasonal allergic rhinitis due to pollen 05/03/2017  . Mild persistent asthma, uncomplicated 09/81/1914  . Thyroid nodule, uninodular 09/28/2011  . Unspecified hypothyroidism 09/10/2011  . Allergic rhinitis 09/10/2011  . Asthma 04/25/2008  . Contact dermatitis and other eczema due to plants (except food) 08/04/2007  Gwendolyn Grant, PT, DPT, ATC 12/10/20 5:22 PM  [facility] [address] [place], [place], [address] Phone: [phone]   Fax:  [phone]  Name: Kanaya Gunnarson MRN: [id number] Date of Birth: Oct 26, 1971

## 2020-12-12 ENCOUNTER — Other Ambulatory Visit: Payer: Self-pay

## 2020-12-12 ENCOUNTER — Ambulatory Visit: Payer: BC Managed Care – PPO

## 2020-12-12 ENCOUNTER — Ambulatory Visit (INDEPENDENT_AMBULATORY_CARE_PROVIDER_SITE_OTHER): Payer: BC Managed Care – PPO | Admitting: *Deleted

## 2020-12-12 DIAGNOSIS — M25674 Stiffness of right foot, not elsewhere classified: Secondary | ICD-10-CM

## 2020-12-12 DIAGNOSIS — R262 Difficulty in walking, not elsewhere classified: Secondary | ICD-10-CM

## 2020-12-12 DIAGNOSIS — J309 Allergic rhinitis, unspecified: Secondary | ICD-10-CM

## 2020-12-12 DIAGNOSIS — M25572 Pain in left ankle and joints of left foot: Secondary | ICD-10-CM | POA: Diagnosis not present

## 2020-12-12 DIAGNOSIS — M25675 Stiffness of left foot, not elsewhere classified: Secondary | ICD-10-CM

## 2020-12-12 DIAGNOSIS — M722 Plantar fascial fibromatosis: Secondary | ICD-10-CM

## 2020-12-12 NOTE — Therapy (Signed)
[place], [place], [address] Phone: [phone]   Fax:  [phone]  Physical Therapy Treatment/Discharge   Patient Details  Name: Jacqueline Holmes MRN: [id number] Date of Birth: 1971/08/20 Referring Provider (PT): Lanae Crumbly, DPM   Encounter Date: 12/12/2020   PT End of Session - 12/12/20 1655    Visit Number 8    Number of Visits 12    Date for PT Re-Evaluation 12/13/20    Authorization Type BCBS    PT Start Time 1630    PT Stop Time 1655    PT Time Calculation (min) 25 min    Activity Tolerance Patient tolerated treatment well    Behavior During Therapy [facility] for tasks assessed/performed           Past Medical History:  Diagnosis Date  . Abnormal uterine bleeding (AUB)    march/april 2021  . Allergy   . Angio-edema   . Asthma   . Cancer ([place])    skin cancer removed   . Cough    non prod  . Diabetes mellitus arising in pregnancy    now pre-diabetes  . Elevated BP without diagnosis of hypertension    high with DOCTOR  visits , then goes back to normal   . Family history of breast cancer    MGM  . Family history of pancreatic cancer   . Family history of prostate cancer   . Fatigue   . Generalized headaches    migraines on occasion.  Marland Kitchen GERD (gastroesophageal reflux disease)   . Graves disease 03/2010  . Heart murmur   . History of COVID-19 06/26/2020  . Hyperlipidemia   . Hypothyroidism    s/p thyroidectomy for Grave's disease ([facility] Tye Savoy [facility])  . IBS (irritable bowel syndrome)   . Incontinence   . Infertility, female   . Migraine    without aura  . Plantar fasciitis, bilateral 2021  . Pre-diabetes   . Recurrent upper respiratory infection (URI)    last uri dec 2020  . Sinus problem    runny nose  . Sleep apnea    mild no cpap needed   . Uterine polyp     Past Surgical History:  Procedure Laterality Date  . ABDOMINOPLASTY  12/2015   Dr. Towanda Malkin   . BREAST REDUCTION SURGERY  11/03/2018  . BREAST SURGERY  10/2018   breast reduction  . CESAREAN SECTION     x 1  . DILATATION & CURETTAGE/HYSTEROSCOPY WITH TRUECLEAR N/A 01/16/2014   Procedure: DILATATION & CURETTAGE/HYSTEROSCOPY WITH TRUCLEAR;  Surgeon: Marylynn Pearson, MD;  Location: [facility];  Service: Gynecology;  Laterality: N/A;  . LASIK    . prk laser eye surgery Left 08/2019  . ROBOTIC ASSISTED SALPINGO OOPHERECTOMY Bilateral 03/28/2020   Procedure: XI ROBOTIC ASSISTED SALPINGO OOPHORECTOMY;;  Surgeon: Lafonda Mosses, MD;  Location: [facility];  Service: Gynecology;  Laterality: Bilateral;  . skin cancer remove  06/2020  . TOTAL THYROIDECTOMY  07/30/11   Dr. Harlow Asa (Grave's disease)  . VULVA Milagros Loll BIOPSY N/A 01/16/2014   Procedure: VULVAR BIOPSY;  Surgeon: Marylynn Pearson, MD;  Location: [facility];  Service: Gynecology;  Laterality: N/A;    There were no vitals filed for this visit.   Subjective Assessment - 12/12/20 1635    Subjective "Overall my feet have been feeling pretty good." She denies any intense pain in the feet/calves recently. She has been able to walk without pain, but hasn't  done much long distances walking yet. She feels that she is ready for D/C and feels confident in continuing her HEP independently.    Diagnostic tests xrays : spurs    Currently in Pain? No/denies              [facility] PT Assessment - 12/12/20 0001      Observation/Other Assessments   Focus on Therapeutic Outcomes (FOTO)  83% function      AROM   Right Ankle Dorsiflexion 10    Right Ankle Plantar Flexion 65    Right Ankle Inversion 28    Right Ankle Eversion 28    Left Ankle Dorsiflexion 10    Left Ankle Plantar Flexion 65    Left Ankle Inversion 25    Left Ankle Eversion 25      Strength   Overall Strength Comments 5/5 ankle strength bilaterally                         [facility] Adult PT Treatment/Exercise - 12/12/20 0001      Ankle Exercises:  Standing   Other Standing Ankle Exercises 3 way hip with resistance 1 x 5 each green TB                  PT Education - 12/12/20 1656    Education Details D/C education. Review of advanced HEP stressing importance of continuing with prescribed program. FOTO outcome results. Education on integrating walking program.    Person(s) Educated Patient    Methods Explanation;Demonstration;Handout    Comprehension Verbalized understanding            PT Short Term Goals - 12/12/20 1643      PT SHORT TERM GOAL #1   Title she will be indpendent with initial HEp    Time 3    Period Weeks    Status Achieved      PT SHORT TERM GOAL #2   Title FOTO score improved 10 points    Time 3    Period Weeks    Status Achieved      PT SHORT TERM GOAL #3   Title She willl report able to stand for > 10 min before incr pain    Time 3    Period Weeks    Status Achieved      PT SHORT TERM GOAL #4   Title Active DF will incr to 95 degrees bilaterally    Time 3    Period Weeks    Status Achieved             PT Long Term Goals - 12/12/20 1658      PT LONG TERM GOAL #1   Title She will be indpendent with all HEP issued    Time 6    Period Weeks    Status Achieved      PT LONG TERM GOAL #2   Title FOTO score will improve to 67% or better    Time 6    Period Weeks    Status Achieved      PT LONG TERM GOAL #3   Title Her pain will be intermittant and only with walking 1/2 mile or more    Time 6    Period Weeks    Status Achieved      PT LONG TERM GOAL #4   Title Active DF of each foot to 100 degreees to decr tension with walking to decr pain.  Time 6    Period Weeks    Status Achieved      PT LONG TERM GOAL #5   Title She will e able to walk into work with no pain    Time 6    Period Weeks    Status Achieved                 Plan - 12/12/20 1644    Clinical Impression Statement Patient has progressed well throughout duration of care having achieved all short  term and long term functional goals. She demonstrates significant improvement in ankle dorsiflexion AROM bilaterally and has tolerated progression into standing activity well without reports of pain. She is therefore appropriate for D/C at this time with advanced home program.    Personal Factors and Comorbidities Time since onset of injury/illness/exacerbation;Past/Current Experience    Examination-Activity Limitations --    Examination-Participation Restrictions --    Stability/Clinical Decision Making Stable/Uncomplicated    Rehab Potential Good    PT Frequency --    PT Duration --    PT Treatment/Interventions Taping;Passive range of motion;Manual techniques;Patient/family education;Therapeutic activities;Therapeutic exercise;Cryotherapy;Moist Heat;Ultrasound;Iontophoresis 82m/ml Dexamethasone;Dry needling    PT Next Visit Plan --    PT Home Exercise Plan Access Code [email or web address]    Consulted and Agree with Plan of Care Patient           Patient will benefit from skilled therapeutic intervention in order to improve the following deficits and impairments:  Pain,Difficulty walking,Decreased range of motion,Decreased activity tolerance  Visit Diagnosis: Pain in left ankle and joints of left foot  Plantar fasciitis of left foot  Difficulty in walking, not elsewhere classified  Stiffness of right foot, not elsewhere classified  Stiffness of left foot, not elsewhere classified  Plantar fasciitis of right foot     Problem List Patient Active Problem List   Diagnosis Date Noted  . Elevated blood pressure reading in office with diagnosis of hypertension 10/22/2020  . Belching 10/22/2020  . Nausea 10/22/2020  . Abdominal pain 10/22/2020  . Gastroesophageal reflux disease 10/22/2020  . Early satiety 10/22/2020  . Seasonal and perennial allergic rhinitis 08/23/2020  . Genetic testing 03/05/2020  . Family history of prostate cancer   . Family history of pancreatic cancer   . Cysts  of both ovaries 02/09/2020  . Allergic conjunctivitis of both eyes 10/06/2019  . Elevated ALT measurement 01/28/2018  . Moderate persistent asthma with acute exacerbation 01/18/2018  . Allergic conjunctivitis 01/18/2018  . Elevated blood-pressure reading without diagnosis of hypertension 01/18/2018  . Non-seasonal allergic rhinitis due to pollen 05/03/2017  . Mild persistent asthma, uncomplicated 093/81/8299 . Thyroid nodule, uninodular 09/28/2011  . Unspecified hypothyroidism 09/10/2011  . Allergic rhinitis 09/10/2011  . Asthma 04/25/2008  . Contact dermatitis and other eczema due to plants (except food) 08/04/2007   PHYSICAL THERAPY DISCHARGE SUMMARY  Visits from Start of Care: 8  Current functional level related to goals / functional outcomes: See above   Remaining deficits: n/a   Education / Equipment: See above   Plan: Patient agrees to discharge.  Patient goals were met. Patient is being discharged due to meeting the stated rehab goals.  ?????       SGwendolyn Grant PT, DPT, ATC 12/12/20 5:06 PM   [facility]GSomerville N[place] [address]Phone: [phone]  Fax:  [phone] Name: Jacqueline WesenbergMRN: [id number] of Birth: 7Aug 23, 1972

## 2020-12-16 ENCOUNTER — Encounter: Payer: BC Managed Care – PPO | Admitting: Gastroenterology

## 2020-12-18 ENCOUNTER — Encounter: Payer: Self-pay | Admitting: Gastroenterology

## 2020-12-19 ENCOUNTER — Other Ambulatory Visit: Payer: Self-pay

## 2020-12-19 ENCOUNTER — Ambulatory Visit (INDEPENDENT_AMBULATORY_CARE_PROVIDER_SITE_OTHER): Payer: BC Managed Care – PPO | Admitting: Podiatry

## 2020-12-19 DIAGNOSIS — M722 Plantar fascial fibromatosis: Secondary | ICD-10-CM | POA: Diagnosis not present

## 2020-12-22 NOTE — Progress Notes (Signed)
  Subjective:  Patient ID: Jacqueline Holmes, female    DOB: [birth year],  MRN: [id number]  Chief Complaint  Patient presents with  . Plantar Fasciitis    PT stated that she is doing much better.    50 y.o. female returns with the above complaint. History confirmed with patient.  She is doing much better.  She is still doing the stretching exercises.  It did come back for a day or 2 when she stopped doing these  Objective:  Physical Exam: warm, good capillary refill, no trophic changes or ulcerative lesions, normal DP and PT pulses and normal sensory exam.  Today she has no pain at all either plantar fascia or heel bilaterally  Assessment:   1. Plantar fasciitis, left   2. Plantar fasciitis of right foot      Plan:  Patient was evaluated and treated and all questions answered.    -Continue stretching and icing of the affected limb until having no pain for 3 to 4 weeks -Meloxicam as needed -She is doing much better now.  I will discharge her from care she will return as needed if it worsens or returns  Return if symptoms worsen or fail to improve.

## 2020-12-24 ENCOUNTER — Other Ambulatory Visit: Payer: Self-pay | Admitting: Gastroenterology

## 2020-12-24 ENCOUNTER — Other Ambulatory Visit: Payer: Self-pay

## 2020-12-24 ENCOUNTER — Ambulatory Visit (AMBULATORY_SURGERY_CENTER): Payer: BC Managed Care – PPO | Admitting: Gastroenterology

## 2020-12-24 ENCOUNTER — Encounter: Payer: Self-pay | Admitting: Gastroenterology

## 2020-12-24 VITALS — BP 140/86 | HR 81 | Temp 98.7°F | Resp 21 | Ht 61.0 in | Wt 174.0 lb

## 2020-12-24 DIAGNOSIS — D122 Benign neoplasm of ascending colon: Secondary | ICD-10-CM

## 2020-12-24 DIAGNOSIS — K633 Ulcer of intestine: Secondary | ICD-10-CM | POA: Diagnosis not present

## 2020-12-24 DIAGNOSIS — Z1211 Encounter for screening for malignant neoplasm of colon: Secondary | ICD-10-CM

## 2020-12-24 MED ORDER — SODIUM CHLORIDE 0.9 % IV SOLN: 500.0000 mL | Freq: Once | INTRAVENOUS | Status: DC

## 2020-12-24 NOTE — Progress Notes (Signed)
Called to room to assist during endoscopic procedure.  Patient ID and intended procedure confirmed with present staff. Received instructions for my participation in the procedure from the performing physician.  

## 2020-12-24 NOTE — Progress Notes (Signed)
To PACU, VSS. Report to [email or web address] 

## 2020-12-24 NOTE — Patient Instructions (Addendum)
Handouts on polyps, diverticulosis, hemorrhoids,& high fiber diet  given to you today.  Await pathology results on polyps removed and on biopsies done    YOU HAD AN ENDOSCOPIC PROCEDURE TODAY AT [place]:   Refer to the procedure report that was given to you for any specific questions about what was found during the examination.  If the procedure report does not answer your questions, please call your gastroenterologist to clarify.  If you requested that your care partner not be given the details of your procedure findings, then the procedure report has been included in a sealed envelope for you to review at your convenience later.  YOU SHOULD EXPECT: Some feelings of bloating in the abdomen. Passage of more gas than usual.  Walking can help get rid of the air that was put into your GI tract during the procedure and reduce the bloating. If you had a lower endoscopy (such as a colonoscopy or flexible sigmoidoscopy) you may notice spotting of blood in your stool or on the toilet paper. If you underwent a bowel prep for your procedure, you may not have a normal bowel movement for a few days.  Please Note:  You might notice some irritation and congestion in your nose or some drainage.  This is from the oxygen used during your procedure.  There is no need for concern and it should clear up in a day or so.  SYMPTOMS TO REPORT IMMEDIATELY:   Following lower endoscopy (colonoscopy or flexible sigmoidoscopy):  Excessive amounts of blood in the stool  Significant tenderness or worsening of abdominal pains  Swelling of the abdomen that is new, acute  Fever of 100F or higher    For urgent or emergent issues, a gastroenterologist can be reached at any hour by calling [phone]. Do not use MyChart messaging for urgent concerns.    DIET:  We do recommend a small meal at first, but then you may proceed to your regular diet.  Drink plenty of fluids but you should avoid  alcoholic beverages for 24 hours.  ACTIVITY:  You should plan to take it easy for the rest of today and you should NOT DRIVE or use heavy machinery until tomorrow (because of the sedation medicines used during the test).    FOLLOW UP: Our staff will call the number listed on your records 48-72 hours following your procedure to check on you and address any questions or concerns that you may have regarding the information given to you following your procedure. If we do not reach you, we will leave a message.  We will attempt to reach you two times.  During this call, we will ask if you have developed any symptoms of COVID 19. If you develop any symptoms (ie: fever, flu-like symptoms, shortness of breath, cough etc.) before then, please call [phone].  If you test positive for Covid 19 in the 2 weeks post procedure, please call and report this information to [place].    If any biopsies were taken you will be contacted by phone or by letter within the next 1-3 weeks.  Please call us at [phone] if you have not heard about the biopsies in 3 weeks.    SIGNATURES/CONFIDENTIALITY: You and/or your care partner have signed paperwork which will be entered into your electronic medical record.  These signatures attest to the fact that that the information above on your After Visit Summary has been reviewed and is understood.  Full responsibility of the  confidentiality of this discharge information lies with you and/or your care-partner. 

## 2020-12-24 NOTE — Op Note (Addendum)
[facility] Patient Name: Jacqueline Holmes Procedure Date: 12/24/2020 11:35 AM MRN: [id number] Endoscopist: Thornton Park MD, MD Age: 50 Referring MD:  Date of Birth: 06-15-1971 Gender: Female Account #: [id number] Procedure:                Colonoscopy Indications:              Screening for colorectal malignant neoplasm, This                            is the patient's first colonoscopy                           Mom may have had colon polyps                           No known family history of colon cancer                           No baseline GI symptoms Medicines:                Monitored Anesthesia Care Procedure:                Pre-Anesthesia Assessment:                           - Prior to the procedure, a History and Physical                            was performed, and patient medications and                            allergies were reviewed. The patient's tolerance of                            previous anesthesia was also reviewed. The risks                            and benefits of the procedure and the sedation                            options and risks were discussed with the patient.                            All questions were answered, and informed consent                            was obtained. Prior Anticoagulants: The patient has                            taken no previous anticoagulant or antiplatelet                            agents. ASA Grade Assessment: III - A patient with  severe systemic disease. After reviewing the risks                            and benefits, the patient was deemed in                            satisfactory condition to undergo the procedure.                           After obtaining informed consent, the colonoscope                            was passed under direct vision. Throughout the                            procedure, the patient's blood pressure, pulse, and                             oxygen saturations were monitored continuously. The                            Olympus CF-HQ190L [id number]) Colonoscope was                            introduced through the anus and advanced to the the                            cecum, identified by appendiceal orifice and                            ileocecal valve. The colonoscopy was performed with                            moderate difficulty due to a redundant colon,                            significant looping and a tortuous colon.                            Successful completion of the procedure was aided by                            changing the patient's position, withdrawing and                            reinserting the scope and applying abdominal                            pressure. I was unable to intubate the IC valve                            despite multiple attempts due to sharp angulation.  The patient tolerated the procedure well. The                            quality of the bowel preparation was good. The                            ileocecal valve, appendiceal orifice, and rectum                            were photographed. The photographs were not saved                            by [employer] ([employer]). The images were captured                            as photos and uploaded into the electronic health                            record (EPIC). Scope In: 11:51:45 AM Scope Out: 12:15:10 PM Scope Withdrawal Time: 0 hours 15 minutes 26 seconds  Total Procedure Duration: 0 hours 23 minutes 25 seconds  Findings:                 The perianal and digital rectal examinations were                            normal.                           Non-bleeding internal hemorrhoids were found.                           A few small and large-mouthed diverticula were                            found in the sigmoid colon.                           Two sessile polyps were found in the ascending                             colon. The polyps were 1 to 2 mm in size. These                            polyps were removed with a cold snare. Resection                            and retrieval were complete. Estimated blood loss                            was minimal.                           Multiple superficial ulcers were found in the  proximal transverse colon, at the hepatic flexure                            and in the ascending colon. The mucosa between the                            ulcers appeared normal. No bleeding was present.                            Biopsies were taken with a cold forceps for                            histology. Estimated blood loss was minimal.                           The exam was otherwise without abnormality on                            direct and retroflexion views. Biopsies were taken                            throughout the remainder of the colon. Complications:            No immediate complications. Estimated blood loss:                            Minimal. Estimated Blood Loss:     Estimated blood loss was minimal. Impression:               - Non-bleeding internal hemorrhoids.                           - Diverticulosis in the sigmoid colon.                           - Two 1 to 2 mm polyps in the ascending colon,                            removed with a cold snare. Resected and retrieved.                           - Multiple ulcers in the proximal transverse colon,                            at the hepatic flexure and in the ascending colon.                            Biopsied, as was the normal appearing colon. This                            is of unclear clinical significance.                           - The examination was otherwise normal on direct  and retroflexion views. Recommendation:           - Patient has a contact number available for                            emergencies. The signs and  symptoms of potential                            delayed complications were discussed with the                            patient. Return to normal activities tomorrow.                            Written discharge instructions were provided to the                            patient.                           - High fiber diet.                           - Continue present medications.                           - Avoid all NSAIDs - such as ibuprofen and                            meloxicam - as this can cause ulcers throughout the                            GI tract.                           - Await pathology results.                           - Repeat colonoscopy date to be determined after                            pending pathology results are reviewed for                            surveillance.                           - Emerging evidence supports eating a diet of                            fruits, vegetables, grains, calcium, and yogurt                            while reducing red meat and alcohol may reduce the  risk of colon cancer.                           - Thank you for allowing me to be involved in your                            colon cancer prevention. Thornton Park MD, MD 12/24/2020 [id number] PM This report has been signed electronically.

## 2020-12-24 NOTE — Progress Notes (Signed)
Pt's states no medical or surgical changes since previsit or office visit.  CW - vitals 

## 2020-12-25 NOTE — Progress Notes (Signed)
Chief Complaint  Patient presents with  . Follow-up    Saw Shane end of last year for GI issues, was started omeprazole-took on and off, mostly on-helped while on. Had colonoscopy on Tuesday-ulcers in her colon were found. Wanted to follow up on whether or not she should continue with omeprazole or what she should do.     She saw Audelia Acton in November for epigastric pain, burning, indigestion.  She has been taking omeprazole ever since then.  She took it for 2 weeks, had recurrent symptoms, so she resumed taking it.  Every time she has tried stopping the omeprazole, symptoms recurred. Review of chart--Shane felt recent NSAIDs and prednisone, in addition to stress could have triggered gastritis or GERD. She tries to avoid foods which trigger GERD, does have a small amount of dark chocolate at night.  She had colonoscopy 12/24/20 with Dr. Tarri Glenn: - Non-bleeding internal hemorrhoids. - Diverticulosis in the sigmoid colon. - Two 1 to 2 mm polyps in the ascending colon, removed with a cold snare. Resected and retrieved. - Multiple ulcers in the proximal transverse colon, at the hepatic flexure and in the ascending colon. Biopsied, as was the normal appearing colon. This is of unclear clinical significance. - The examination was otherwise normal on direct and retroflexion views.  Pathology is still pending.  She wonders if the ulcers seen were related to her other symptoms. She denies any changes to her bowels--no diarrhea, blood or mucus in the stool.  Hypothyroidism.  Synthroid was stopped after her last labs in November. She continues on Armour thyroid 90mg  BID.  She is asking to be switched back to NP thyroid--thinks I changed to Armour to prefer a branded med, but it is much more expensive and is asking to be changed back to generic. Chart was reviewed, as I didn't recall changing anything intentionally at all (other than stopping the Synthroid).  It appears that the refill request came through  that way from her pharmacy, and was approved by nurse. Not intentionally changed to a more expensive medication. She has 1 week left, needs refill. Feels fine off synthroid, denies any thyroid symptoms.   PMH, PSH, SH reviewed  Outpatient Encounter Medications as of 12/26/2020  Medication Sig Note  . budesonide-formoterol (SYMBICORT) 160-4.5 MCG/ACT inhaler Two puffs with spacer device twice a day during asthma flares.  Rinse, gargle and spit after use. (Patient taking differently: Two puffs with spacer device twice a day during asthma flares.  Rinse, gargle and spit after use.) 12/26/2020: As needed  . estradiol (ESTRACE) 0.5 MG tablet Take 1 tablet (0.5 mg total) by mouth daily.   Marland Kitchen Fexofenadine HCl (ALLEGRA PO) Take by mouth.   . levocetirizine (XYZAL) 5 MG tablet Take 5 mg by mouth every evening.   Marland Kitchen omeprazole (PRILOSEC) 40 MG capsule Take 1 capsule (40 mg total) by mouth daily.   . progesterone (PROMETRIUM) 100 MG capsule Take 1 capsule (100 mg total) by mouth at bedtime.   Marland Kitchen QVAR REDIHALER 80 MCG/ACT inhaler INHALE 2 PUFFS TWICE DAILY   . Spacer/Aero-Holding Chambers [facility] DIAMOND) DEVI Use as directed with inhaler. Dx:  J45.40 Asthma   . thyroid (NP THYROID) 90 MG tablet Take 1 tablet (90 mg total) by mouth daily.   . [DISCONTINUED] ARMOUR THYROID 90 MG tablet TAKE 1 TABLET IN THE MORNING AND 1 TABLET AT BEDTIME   . acetaminophen (TYLENOL) 500 MG tablet Take 1,000 mg by mouth every 6 (six) hours as needed for headache. Reported on  06/17/2016 (Patient not taking: Reported on 12/26/2020)   . PROAIR HFA 108 (90 Base) MCG/ACT inhaler Inhale 2 puffs into the lungs every 4 (four) hours as needed. (Patient not taking: Reported on 12/26/2020)   . [DISCONTINUED] metFORMIN (GLUCOPHAGE-XR) 500 MG 24 hr tablet Take 2 tablets (1,000 mg total) by mouth daily with breakfast.    No facility-administered encounter medications on file as of 12/26/2020.   No Known Allergies  ROS: no fever, chills,  URI symptoms, chest pain, shortness of breath.  Indigestion/GERD per HPI.  No abdominal pain, change in bowels. No changes to hair/skin/nails/moods/energy/weight. No nausea or vomiting.   PHYSICAL EXAM:  BP 140/90   Pulse 92   Ht 5\' 1"  (1.549 m)   Wt 173 lb 6.4 oz (78.7 kg)   LMP 01/28/2017   BMI 32.76 kg/m   Pleasant, well-appearing female in no distress HEENT: conjunctiva and sclera clear, EOMI, wearing mask Neck: no lymphadenopathy or mass Heart: regular rate and rhythm Lungs: clear bilaterally Abdomen: Mildly tender LUQ, RLQ.  No rebound or guarding.  No epigasric tenderness Extremities: no edema Psych: normal mood, affect, hygiene and grooming   ASSESSMENT/PLAN:  Gastroesophageal reflux disease, unspecified whether esophagitis present - recurrent sx when stops omeprazole 40mg . Discussed using lowest effective dose, safety, poss H2 blocker trial  Colonic ulcer - path pending--further info to come from her GI  Hypothyroidism, unspecified type - change back to generic NP thyroid. Prefers labs to be done prior to her med check in March - Plan: TSH, T3, Free, T4, Free, thyroid (NP THYROID) 90 MG tablet  Medication monitoring encounter - Plan: TSH, T3, Free, T4, Free  Counseled re: risks/SE of PPI vs H2 blockers vs risks of untreated GERD. Reviewed dietary/behavioral measures to treat reflux.  All questions answered.  I spent 32 minutes dedicated to the care of this patient, including pre-visit review of records, face to face time, post-visit ordering of testing and documentation.

## 2020-12-26 ENCOUNTER — Ambulatory Visit: Payer: BC Managed Care – PPO | Admitting: Family Medicine

## 2020-12-26 ENCOUNTER — Telehealth: Payer: Self-pay | Admitting: *Deleted

## 2020-12-26 ENCOUNTER — Other Ambulatory Visit: Payer: Self-pay

## 2020-12-26 ENCOUNTER — Telehealth: Payer: Self-pay

## 2020-12-26 ENCOUNTER — Encounter: Payer: Self-pay | Admitting: Family Medicine

## 2020-12-26 VITALS — BP 140/90 | HR 92 | Ht 61.0 in | Wt 173.4 lb

## 2020-12-26 DIAGNOSIS — Z5181 Encounter for therapeutic drug level monitoring: Secondary | ICD-10-CM | POA: Diagnosis not present

## 2020-12-26 DIAGNOSIS — K633 Ulcer of intestine: Secondary | ICD-10-CM

## 2020-12-26 DIAGNOSIS — E039 Hypothyroidism, unspecified: Secondary | ICD-10-CM

## 2020-12-26 DIAGNOSIS — K219 Gastro-esophageal reflux disease without esophagitis: Secondary | ICD-10-CM | POA: Diagnosis not present

## 2020-12-26 MED ORDER — THYROID 90 MG PO TABS: 90.0000 mg | ORAL_TABLET | Freq: Every day | ORAL | 1 refills | Status: DC

## 2020-12-26 NOTE — Patient Instructions (Addendum)
I recommend using the lowest effective dose of medication to keep symptoms (GERD) under control--whether that be trying prilosec 20mg  (OTC) or switchiing to Pepcid twice daily (maximum dose would be 40mg  twice daily).  This is a H2 blocker, rather than PPI, so less effect on absorption of other vitamins.  A little safer to use longterm, IF it is effective. Untreated reflux is worse than any worries about effects from the PPI, as long as you take the vitamins (B12, Mg, calcium).  We are switching back to NP thyroid--let us know if you're having thyroid symptoms prior to your March visit, we can check labs sooner. We will check labs prior to your next visit so we have them to discuss. Dr. Denton Lank at [facility] is an endocrinologist who is great with thyroid disease.   Food Choices for Gastroesophageal Reflux Disease, Adult When you have gastroesophageal reflux disease (GERD), the foods you eat and your eating habits are very important. Choosing the right foods can help ease the discomfort of GERD. Consider working with a dietitian to help you make healthy food choices. What are tips for following this plan? Reading food labels  Look for foods that are low in saturated fat. Foods that have less than 5% of daily value (DV) of fat and 0 g of trans fats may help with your symptoms. Cooking  Cook foods using methods other than frying. This may include baking, steaming, grilling, or broiling. These are all methods that do not need a lot of fat for cooking.  To add flavor, try to use herbs that are low in spice and acidity. Meal planning  Choose healthy foods that are low in fat, such as fruits, vegetables, whole grains, low-fat dairy products, lean meats, fish, and poultry.  Eat frequent, small meals instead of three large meals each day. Eat your meals slowly, in a relaxed setting. Avoid bending over or lying down until 2-3 hours after eating.  Limit high-fat foods such as fatty meats or fried  foods.  Limit your intake of fatty foods, such as oils, butter, and shortening.  Avoid the following as told by your health care provider: ? Foods that cause symptoms. These may be different for different people. Keep a food diary to keep track of foods that cause symptoms. ? Alcohol. ? Drinking large amounts of liquid with meals. ? Eating meals during the 2-3 hours before bed.   Lifestyle  Maintain a healthy weight. Ask your health care provider what weight is healthy for you. If you need to lose weight, work with your health care provider to do so safely.  Exercise for at least 30 minutes on 5 or more days each week, or as told by your health care provider.  Avoid wearing clothes that fit tightly around your waist and chest.  Do not use any products that contain nicotine or tobacco. These products include cigarettes, chewing tobacco, and vaping devices, such as e-cigarettes. If you need help quitting, ask your health care provider.  Sleep with the head of your bed raised. Use a wedge under the mattress or blocks under the bed frame to raise the head of the bed.  Chew sugar-free gum after mealtimes. What foods should I eat? Eat a healthy, well-balanced diet of fruits, vegetables, whole grains, low-fat dairy products, lean meats, fish, and poultry. Each person is different. Foods that may trigger symptoms in one person may not trigger any symptoms in another person. Work with your health care provider to identify foods  that are safe for you. The items listed above may not be a complete list of recommended foods and beverages. Contact a dietitian for more information.   What foods should I avoid? Limiting some of these foods may help manage the symptoms of GERD. Everyone is different. Consult a dietitian or your health care provider to help you identify the exact foods to avoid, if any. Fruits Any fruits prepared with added fat. Any fruits that cause symptoms. For some people this may  include citrus fruits, such as oranges, grapefruit, pineapple, and lemons. Vegetables Deep-fried vegetables. [place] fries. Any vegetables prepared with added fat. Any vegetables that cause symptoms. For some people, this may include tomatoes and tomato products, chili peppers, onions and garlic, and horseradish. Grains Pastries or quick breads with added fat. Meats and other proteins High-fat meats, such as fatty beef or pork, hot dogs, ribs, ham, sausage, salami, and bacon. Fried meat or protein, including fried fish and fried chicken. Nuts and nut butters, in large amounts. Dairy Whole milk and chocolate milk. Sour cream. Cream. Ice cream. Cream cheese. Milkshakes. Fats and oils Butter. Margarine. Shortening. Ghee. Beverages Coffee and tea, with or without caffeine. Carbonated beverages. Sodas. Energy drinks. Fruit juice made with acidic fruits, such as orange or grapefruit. Tomato juice. Alcoholic drinks. Sweets and desserts Chocolate and cocoa. Donuts. Seasonings and condiments Pepper. Peppermint and spearmint. Added salt. Any condiments, herbs, or seasonings that cause symptoms. For some people, this may include curry, hot sauce, or vinegar-based salad dressings. The items listed above may not be a complete list of foods and beverages to avoid. Contact a dietitian for more information. Questions to ask your health care provider Diet and lifestyle changes are usually the first steps that are taken to manage symptoms of GERD. If diet and lifestyle changes do not improve your symptoms, talk with your health care provider about taking medicines. Where to find more information  International Foundation for Gastrointestinal Disorders: aboutgerd.org Summary  When you have gastroesophageal reflux disease (GERD), food and lifestyle choices may be very helpful in easing the discomfort of GERD.  Eat frequent, small meals instead of three large meals each day. Eat your meals slowly, in a relaxed  setting. Avoid bending over or lying down until 2-3 hours after eating.  Limit high-fat foods such as fatty meats or fried foods. This information is not intended to replace advice given to you by your health care provider. Make sure you discuss any questions you have with your health care provider. Document Revised: 05/27/2020 Document Reviewed: 05/27/2020 Elsevier Patient Education  [place].

## 2020-12-26 NOTE — Telephone Encounter (Signed)
  Follow up Call-  Call back number 12/24/2020  Post procedure Call Back phone  # [phone]  Permission to leave phone message Yes  Some recent data might be hidden     Patient questions:  Do you have a fever, pain , or abdominal swelling? No. Pain Score  0 *  Have you tolerated food without any problems? Yes.    Have you been able to return to your normal activities? Yes.    Do you have any questions about your discharge instructions: Diet   No. Medications  No. Follow up visit  No.  Do you have questions or concerns about your Care? No.  Actions: * If pain score is 4 or above: No action needed, pain <4. 1. Have you developed a fever since your procedure? no  2.   Have you had an respiratory symptoms (SOB or cough) since your procedure? no  3.   Have you tested positive for COVID 19 since your procedure no  4.   Have you had any family members/close contacts diagnosed with the COVID 19 since your procedure?  no   If yes to any of these questions please route to Joylene John, RN and Joella Prince, RN

## 2020-12-26 NOTE — Telephone Encounter (Signed)
Left message on follow up call. 

## 2021-01-06 ENCOUNTER — Ambulatory Visit: Payer: BC Managed Care – PPO | Admitting: Gastroenterology

## 2021-01-06 ENCOUNTER — Encounter: Payer: Self-pay | Admitting: Gastroenterology

## 2021-01-06 ENCOUNTER — Other Ambulatory Visit: Payer: Self-pay | Admitting: Family Medicine

## 2021-01-06 VITALS — BP 164/110 | HR 128 | Ht 61.25 in | Wt 174.2 lb

## 2021-01-06 DIAGNOSIS — E039 Hypothyroidism, unspecified: Secondary | ICD-10-CM

## 2021-01-06 DIAGNOSIS — R1013 Epigastric pain: Secondary | ICD-10-CM | POA: Diagnosis not present

## 2021-01-06 DIAGNOSIS — K633 Ulcer of intestine: Secondary | ICD-10-CM | POA: Diagnosis not present

## 2021-01-06 MED ORDER — OMEPRAZOLE 40 MG PO CPDR: 40.0000 mg | DELAYED_RELEASE_CAPSULE | Freq: Two times a day (BID) | ORAL | 2 refills | Status: DC

## 2021-01-06 NOTE — Patient Instructions (Addendum)
RECOMMENDATION(S):   I am recommending an endoscopy to better evaluate your symptoms.  . You have been scheduled for an endoscopy. Please follow written instructions given to you at your visit today.  INHALERS:   . If you use inhalers (even only as needed), please bring them with you on the day of your procedure.  PRESCRIPTION MEDICATION(S):   . We have sent the following medication(s) to your pharmacy:   Omeprazole - Please take 40mg  by mouth twice daily   NOTE: If your medication(s) requires a PRIOR AUTHORIZATION, we will receive notification from your pharmacy. Once received, the process to submit for approval may take up to 7-10 business days. You will be contacted about any denials we have received from your insurance company as well as alternatives recommended by your provider.  BMI:  If you are age 56 or younger, your body mass index should be between 19-25. Your Body mass index is 32.66 kg/m. If this is out of the aformentioned range listed, please consider follow up with your Primary Care Provider.   Thank you for trusting me with your gastrointestinal care!    Thornton Park, MD, MPH

## 2021-01-06 NOTE — Progress Notes (Signed)
Referring Provider: [name], MD Primary Care Physician:  [name], MD  Reason for Consultation:  Ulcers seen on colonoscopy   IMPRESSION:  Reflux with intermittent nausea Epigastric pain - improved after stopping NSAIDs Right-sided colon ulcers     - colon biopsies most consistent with ischemic colitis    - biopsies obtained throughout the remainder of the colon and TI were normal    - no histopathology features of IBD    - unclear if abdominal pain was related    - likely due to NSAIDs    - low threshold to pursue CTA and repeat colonoscopy with recurrent abdominal pain History of colon polyps    - 2 tubular adenomas on colonoscopy 12/24/20    - surveillance colonoscopy recommended in 2029 PLAN: - Omeprazole 40 mg BID - No NSAIDs - EGD with gastric biopsies to evaluate for H pylori, PUD, gastritis, and esophagitis - Stool of GI pathogen panel and fecal calprotectin with development of diarrhea - Low-threshold to pursue CTA +/- repeat colonoscopy  Please see the "Patient Instructions" section for addition details about the plan.  HPI: Jacqueline Holmes is a 50 y.o. female who returns after screening colonoscopy. This is her first office visit. Screening colonoscopy 12/24/20 showed internal hemorrhoids, sigmoid diverticulosis, two small ascending colon tubular adenomas, and multiple superficial ulcers in the proximal transverse colon, hepatic flexure, and ascending colon. Colon biopsies most consistent with ischemic colitis. Biopsies obtained throughout the remainder of the colon and TI were normal.  Returns in follow-up to discussed these findings.   Developed mild left, mid abdominal discomfort in November.  Had been using "too much" ibuprofen and then meloxicam for foot pain. Stopped the NSAIDs when the pain developed.   Prilosec OTC intermittently for reflux. Tried to use the lower dose without success.   Chronic nausea since 09/2020, worsened by eating. Coincides with  stressful event with his husband.   No identified food triggers. Coffee stimulates a BM. Postprandial stooling that happens 60-120 minutes after eating.  Alternating diarrhea and constipation.  Weight stable. Appetite is okay. No sitophobia.   No ongoing abdominal pain.   Social alcohol. No street drugs. No tobacco.   Maternal grandmother with pancreatic cancer in her 28s. No known family history of colon cancer or polyps. No family history of uterine/endometrial cancer, pancreatic cancer or gastric/stomach cancer.  Labs 03/25/20: normal CBC  Past Medical History:  Diagnosis Date  . Abnormal uterine bleeding (AUB)    march/april 2021  . Allergy   . Angio-edema   . Asthma   . Cancer ([place])    skin cancer removed   . Cough    non prod  . Diabetes mellitus arising in pregnancy    now pre-diabetes  . Elevated BP without diagnosis of hypertension    high with DOCTOR  visits , then goes back to normal   . Family history of breast cancer    MGM  . Family history of pancreatic cancer   . Family history of prostate cancer   . Fatigue   . Generalized headaches    migraines on occasion.  Marland Kitchen GERD (gastroesophageal reflux disease)   . Graves disease 03/2010  . Heart murmur   . History of COVID-19 06/26/2020  . Hyperlipidemia   . Hypothyroidism    s/p thyroidectomy for Grave's disease (Dr. Tye Savoy at [facility])  . IBS (irritable bowel syndrome)   . Incontinence   . Infertility, female   . Migraine  without aura  . Plantar fasciitis, bilateral 2021  . Pre-diabetes   . Recurrent upper respiratory infection (URI)    last uri dec 2020  . Sinus problem    runny nose  . Sleep apnea    mild no cpap needed   . Uterine polyp     Past Surgical History:  Procedure Laterality Date  . ABDOMINOPLASTY  12/2015   Dr. Towanda Malkin  . BREAST REDUCTION SURGERY  11/03/2018  . BREAST SURGERY  10/2018   breast reduction  . CESAREAN SECTION     x 1  . DILATATION &  CURETTAGE/HYSTEROSCOPY WITH TRUECLEAR N/A 01/16/2014   Procedure: DILATATION & CURETTAGE/HYSTEROSCOPY WITH TRUCLEAR;  Surgeon: Marylynn Pearson, MD;  Location: [facility];  Service: Gynecology;  Laterality: N/A;  . LASIK    . prk laser eye surgery Left 08/2019  . ROBOTIC ASSISTED SALPINGO OOPHERECTOMY Bilateral 03/28/2020   Procedure: XI ROBOTIC ASSISTED SALPINGO OOPHORECTOMY;;  Surgeon: Lafonda Mosses, MD;  Location: [facility];  Service: Gynecology;  Laterality: Bilateral;  . skin cancer remove  06/2020  . TOTAL THYROIDECTOMY  07/30/11   Dr. Harlow Asa (Grave's disease)  . VULVA Milagros Loll BIOPSY N/A 01/16/2014   Procedure: VULVAR BIOPSY;  Surgeon: Marylynn Pearson, MD;  Location: [facility];  Service: Gynecology;  Laterality: N/A;    Current Outpatient Medications  Medication Sig Dispense Refill  . acetaminophen (TYLENOL) 500 MG tablet Take 1,000 mg by mouth every 6 (six) hours as needed for headache. Reported on 06/17/2016    . budesonide-formoterol (SYMBICORT) 160-4.5 MCG/ACT inhaler Two puffs with spacer device twice a day during asthma flares.  Rinse, gargle and spit after use. (Patient taking differently: Two puffs with spacer device twice a day during asthma flares.  Rinse, gargle and spit after use.) 10.2 g 2  . estradiol (ESTRACE) 0.5 MG tablet Take 1 tablet (0.5 mg total) by mouth daily. 90 tablet 3  . Fexofenadine HCl (ALLEGRA PO) Take by mouth.    . levocetirizine (XYZAL) 5 MG tablet Take 5 mg by mouth every evening.    Marland Kitchen omeprazole (PRILOSEC) 40 MG capsule Take 1 capsule (40 mg total) by mouth 2 (two) times daily. 60 capsule 2  . PROAIR HFA 108 (90 Base) MCG/ACT inhaler Inhale 2 puffs into the lungs every 4 (four) hours as needed. 8.5 g 1  . progesterone (PROMETRIUM) 100 MG capsule Take 1 capsule (100 mg total) by mouth at bedtime. 90 capsule 3  . QVAR REDIHALER 80 MCG/ACT inhaler INHALE 2 PUFFS TWICE DAILY 10.6 g 4  . Spacer/Aero-Holding [facility] DIAMOND) DEVI  Use as directed with inhaler. Dx:  J45.40 Asthma 1 Device 2  . thyroid (NP THYROID) 90 MG tablet Take 1 tablet (90 mg total) by mouth in the morning and at bedtime. 60 tablet 1   No current facility-administered medications for this visit.    Allergies as of 01/06/2021  . (No Known Allergies)    Family History  Problem Relation Age of Onset  . Prostate cancer Father        dx. in his 11s  . Allergic rhinitis Mother   . Asthma Mother   . Alpha-1 antitrypsin deficiency Mother   . COPD Mother   . Diabetes Maternal Grandmother   . Pancreatic cancer Maternal Grandmother        dx. in her early 50s  . Heart disease Maternal Grandfather        CABG in 60's  . Diabetes Maternal Grandfather   .  Colon polyps Maternal Grandfather   . Diabetes Paternal Grandmother   . Diabetes Paternal Grandfather   . Heart disease Paternal Grandfather   . Prostate cancer Paternal Uncle        dx. in his 25s  . Angioedema Neg Hx   . Eczema Neg Hx   . Colon cancer Neg Hx   . Esophageal cancer Neg Hx   . Rectal cancer Neg Hx   . Stomach cancer Neg Hx      Physical Exam: General:   Alert,  well-nourished, pleasant and cooperative in NAD Head:  Normocephalic and atraumatic. Eyes:  Sclera clear, no icterus.   Conjunctiva pink. Ears:  Normal auditory acuity. Nose:  No deformity, discharge,  or lesions. Mouth:  No deformity or lesions.   Neck:  Supple; no masses or thyromegaly. Lungs:  Clear throughout to auscultation.   No wheezes. Heart:  Regular rate and rhythm; no murmurs. Abdomen:  Soft, nontender, nondistended, normal bowel sounds, no rebound or guarding. No hepatosplenomegaly.   Rectal:  Deferred  Msk:  Symmetrical. No boney deformities LAD: No inguinal or umbilical LAD Extremities:  No clubbing or edema. Neurologic:  Alert and  oriented x4;  grossly nonfocal Skin:  Intact without significant lesions or rashes. Psych:  Alert and cooperative. Normal mood and affect.    Jihad Brownlow L.  Tarri Glenn, MD, MPH 01/12/2021, 5:57 PM

## 2021-01-12 ENCOUNTER — Encounter: Payer: Self-pay | Admitting: Gastroenterology

## 2021-01-13 DIAGNOSIS — J301 Allergic rhinitis due to pollen: Secondary | ICD-10-CM | POA: Diagnosis not present

## 2021-01-13 NOTE — Progress Notes (Signed)
VIALS EXP 01-13-22 

## 2021-01-14 ENCOUNTER — Ambulatory Visit (INDEPENDENT_AMBULATORY_CARE_PROVIDER_SITE_OTHER): Payer: BC Managed Care – PPO | Admitting: *Deleted

## 2021-01-14 DIAGNOSIS — J309 Allergic rhinitis, unspecified: Secondary | ICD-10-CM

## 2021-01-23 ENCOUNTER — Other Ambulatory Visit: Payer: BC Managed Care – PPO

## 2021-01-23 ENCOUNTER — Other Ambulatory Visit: Payer: Self-pay

## 2021-01-23 DIAGNOSIS — E039 Hypothyroidism, unspecified: Secondary | ICD-10-CM

## 2021-01-23 DIAGNOSIS — Z5181 Encounter for therapeutic drug level monitoring: Secondary | ICD-10-CM

## 2021-01-24 LAB — TSH: TSH: <0.005 u[IU]/mL — ABNORMAL LOW (ref 0.450–4.500)

## 2021-01-24 LAB — T3, FREE: T3, Free: 4 pg/mL (ref 2.0–4.4)

## 2021-01-24 LAB — T4, FREE: Free T4: 1.37 ng/dL (ref 0.82–1.77)

## 2021-01-29 ENCOUNTER — Encounter: Payer: BC Managed Care – PPO | Admitting: Family Medicine

## 2021-01-29 ENCOUNTER — Ambulatory Visit (AMBULATORY_SURGERY_CENTER): Payer: BC Managed Care – PPO | Admitting: Gastroenterology

## 2021-01-29 ENCOUNTER — Encounter: Payer: Self-pay | Admitting: Gastroenterology

## 2021-01-29 ENCOUNTER — Other Ambulatory Visit: Payer: Self-pay

## 2021-01-29 VITALS — BP 141/82 | HR 78 | Temp 97.5°F | Resp 28 | Ht 61.0 in | Wt 174.0 lb

## 2021-01-29 DIAGNOSIS — K319 Disease of stomach and duodenum, unspecified: Secondary | ICD-10-CM

## 2021-01-29 DIAGNOSIS — K2970 Gastritis, unspecified, without bleeding: Secondary | ICD-10-CM | POA: Diagnosis not present

## 2021-01-29 DIAGNOSIS — K219 Gastro-esophageal reflux disease without esophagitis: Secondary | ICD-10-CM | POA: Diagnosis present

## 2021-01-29 DIAGNOSIS — K2950 Unspecified chronic gastritis without bleeding: Secondary | ICD-10-CM | POA: Diagnosis not present

## 2021-01-29 DIAGNOSIS — K2980 Duodenitis without bleeding: Secondary | ICD-10-CM | POA: Diagnosis not present

## 2021-01-29 DIAGNOSIS — R109 Unspecified abdominal pain: Secondary | ICD-10-CM

## 2021-01-29 DIAGNOSIS — K633 Ulcer of intestine: Secondary | ICD-10-CM

## 2021-01-29 MED ORDER — SODIUM CHLORIDE 0.9 % IV SOLN: 500.0000 mL | Freq: Once | INTRAVENOUS | Status: DC

## 2021-01-29 NOTE — Progress Notes (Signed)
Pt Drowsy. VSS. To PACU, report to RN. No anesthetic complications noted.  

## 2021-01-29 NOTE — Progress Notes (Signed)
VS by CW  I have reviewed the patient's medical history in detail and updated the computerized patient record.  Pt has upper right implanted tooth and states "feels loose at times."

## 2021-01-29 NOTE — Progress Notes (Signed)
Called to room to assist during endoscopic procedure.  Patient ID and intended procedure confirmed with present staff. Received instructions for my participation in the procedure from the performing physician.  

## 2021-01-29 NOTE — Patient Instructions (Signed)
Discharge instructions given. Biopsies taken. Resume previous medications. No aspirin,ibuprofen,naproxen,or other non-steroidal anti-inflammatory drugs. YOU HAD AN ENDOSCOPIC PROCEDURE TODAY AT [facility]:   Refer to the procedure report that was given to you for any specific questions about what was found during the examination.  If the procedure report does not answer your questions, please call your gastroenterologist to clarify.  If you requested that your care partner not be given the details of your procedure findings, then the procedure report has been included in a sealed envelope for you to review at your convenience later.  YOU SHOULD EXPECT: Some feelings of bloating in the abdomen. Passage of more gas than usual.  Walking can help get rid of the air that was put into your GI tract during the procedure and reduce the bloating. If you had a lower endoscopy (such as a colonoscopy or flexible sigmoidoscopy) you may notice spotting of blood in your stool or on the toilet paper. If you underwent a bowel prep for your procedure, you may not have a normal bowel movement for a few days.  Please Note:  You might notice some irritation and congestion in your nose or some drainage.  This is from the oxygen used during your procedure.  There is no need for concern and it should clear up in a day or so.  SYMPTOMS TO REPORT IMMEDIATELY:    Following upper endoscopy (EGD)  Vomiting of blood or coffee ground material  New chest pain or pain under the shoulder blades  Painful or persistently difficult swallowing  New shortness of breath  Fever of 100F or higher  Black, tarry-looking stools  For urgent or emergent issues, a gastroenterologist can be reached at any hour by calling [phone]. Do not use MyChart messaging for urgent concerns.    DIET:  We do recommend a small meal at first, but then you may proceed to your regular diet.  Drink plenty of fluids but you should  avoid alcoholic beverages for 24 hours.  ACTIVITY:  You should plan to take it easy for the rest of today and you should NOT DRIVE or use heavy machinery until tomorrow (because of the sedation medicines used during the test).    FOLLOW UP: Our staff will call the number listed on your records 48-72 hours following your procedure to check on you and address any questions or concerns that you may have regarding the information given to you following your procedure. If we do not reach you, we will leave a message.  We will attempt to reach you two times.  During this call, we will ask if you have developed any symptoms of COVID 19. If you develop any symptoms (ie: fever, flu-like symptoms, shortness of breath, cough etc.) before then, please call [phone].  If you test positive for Covid 19 in the 2 weeks post procedure, please call and report this information to [place].    If any biopsies were taken you will be contacted by phone or by letter within the next 1-3 weeks.  Please call us at [phone] if you have not heard about the biopsies in 3 weeks.    SIGNATURES/CONFIDENTIALITY: You and/or your care partner have signed paperwork which will be entered into your electronic medical record.  These signatures attest to the fact that that the information above on your After Visit Summary has been reviewed and is understood.  Full responsibility of the confidentiality of this discharge information lies with you and/or your  care-partner.

## 2021-01-29 NOTE — Op Note (Signed)
[place] Patient Name: Jacqueline Holmes Procedure Date: 01/29/2021 10:09 AM MRN: [id number] Endoscopist: Thornton Park MD, MD Age: 50 Referring MD:  Date of Birth: 01/26/1971 Gender: Female Account #: [id number] Procedure:                Upper GI endoscopy Indications:              Reflux with intermittent nausea                           Epigastric pain Medicines:                Monitored Anesthesia Care Procedure:                Pre-Anesthesia Assessment:                           - Prior to the procedure, a History and Physical                            was performed, and patient medications and                            allergies were reviewed. The patient's tolerance of                            previous anesthesia was also reviewed. The risks                            and benefits of the procedure and the sedation                            options and risks were discussed with the patient.                            All questions were answered, and informed consent                            was obtained. Prior Anticoagulants: The patient has                            taken no previous anticoagulant or antiplatelet                            agents. ASA Grade Assessment: II - A patient with                            mild systemic disease. After reviewing the risks                            and benefits, the patient was deemed in                            satisfactory condition to undergo the procedure.  After obtaining informed consent, the endoscope was                            passed under direct vision. Throughout the                            procedure, the patient's blood pressure, pulse, and                            oxygen saturations were monitored continuously. The                            Endoscope was introduced through the mouth, and                            advanced to the third part of duodenum. The upper                             GI endoscopy was accomplished without difficulty.                            The patient tolerated the procedure well. Scope In: Scope Out: Findings:                 The examined esophagus was normal. Z-line located                            35cm from the incisors. Biopsies were obtained from                            the proximal and distal esophagus with cold forceps                            for histology.                           A few small erosions with no bleeding and no                            stigmata of recent bleeding were found in the                            gastric antrum. Biopsies were taken from the                            antrum, body, and fundus with a cold forceps for                            histology. Estimated blood loss was minimal.                           Localized nodular mucosa was found in the duodenal  bulb. Biopsies were taken with a cold forceps for                            histology. Estimated blood loss was minimal.                           The cardia and gastric fundus were normal on                            retroflexion. Complications:            No immediate complications. Estimated blood loss:                            Minimal. Estimated Blood Loss:     Estimated blood loss was minimal. Estimated blood                            loss was minimal. Impression:               - Normal esophagus. Biopsied.                           - Erosive gastropathy with no bleeding and no                            stigmata of recent bleeding. Biopsied.                           - Normal examined duodenum. Recommendation:           - Patient has a contact number available for                            emergencies. The signs and symptoms of potential                            delayed complications were discussed with the                            patient. Return to normal activities tomorrow.                             Written discharge instructions were provided to the                            patient.                           - Resume previous diet.                           - Continue present medications.                           - Await pathology results.                           -  No aspirin, ibuprofen, naproxen, or other                            non-steroidal anti-inflammatory drugs. Thornton Park MD, MD 01/29/2021 10:29:37 AM This report has been signed electronically.

## 2021-01-29 NOTE — Progress Notes (Signed)
Lidocaine 100mg  IV given to blunt gag reflexes

## 2021-01-31 ENCOUNTER — Telehealth: Payer: Self-pay

## 2021-01-31 NOTE — Telephone Encounter (Signed)
  Follow up Call-  Call back number 01/29/2021 12/24/2020  Post procedure Call Back phone  # [phone]  Permission to leave phone message Yes Yes  Some recent data might be hidden     Patient questions:  Do you have a fever, pain , or abdominal swelling? No. Pain Score  0 *  Have you tolerated food without any problems? Yes.    Have you been able to return to your normal activities? Yes.    Do you have any questions about your discharge instructions: Diet   No. Medications  No. Follow up visit  No.  Do you have questions or concerns about your Care? No.  Actions: * If pain score is 4 or above: No action needed, pain <4.  1. Have you developed a fever since your procedure? no  2.   Have you had an respiratory symptoms (SOB or cough) since your procedure? no  3.   Have you tested positive for COVID 19 since your procedure no  4.   Have you had any family members/close contacts diagnosed with the COVID 19 since your procedure?  no   If yes to any of these questions please route to Joylene John, RN and Joella Prince, RN

## 2021-02-04 ENCOUNTER — Ambulatory Visit (INDEPENDENT_AMBULATORY_CARE_PROVIDER_SITE_OTHER): Payer: BC Managed Care – PPO | Admitting: *Deleted

## 2021-02-04 DIAGNOSIS — J309 Allergic rhinitis, unspecified: Secondary | ICD-10-CM | POA: Diagnosis not present

## 2021-02-05 ENCOUNTER — Encounter: Payer: BC Managed Care – PPO | Admitting: Family Medicine

## 2021-02-21 ENCOUNTER — Ambulatory Visit: Payer: BC Managed Care – PPO | Admitting: Family Medicine

## 2021-02-21 ENCOUNTER — Ambulatory Visit (INDEPENDENT_AMBULATORY_CARE_PROVIDER_SITE_OTHER): Payer: BC Managed Care – PPO | Admitting: *Deleted

## 2021-02-21 DIAGNOSIS — J309 Allergic rhinitis, unspecified: Secondary | ICD-10-CM

## 2021-03-05 ENCOUNTER — Other Ambulatory Visit: Payer: Self-pay

## 2021-03-05 ENCOUNTER — Ambulatory Visit: Payer: Self-pay

## 2021-03-05 ENCOUNTER — Encounter: Payer: Self-pay | Admitting: Family Medicine

## 2021-03-05 ENCOUNTER — Ambulatory Visit: Payer: BC Managed Care – PPO | Admitting: Family Medicine

## 2021-03-05 VITALS — BP 134/84 | HR 97

## 2021-03-05 DIAGNOSIS — J302 Other seasonal allergic rhinitis: Secondary | ICD-10-CM | POA: Diagnosis not present

## 2021-03-05 DIAGNOSIS — H1013 Acute atopic conjunctivitis, bilateral: Secondary | ICD-10-CM

## 2021-03-05 DIAGNOSIS — J4540 Moderate persistent asthma, uncomplicated: Secondary | ICD-10-CM

## 2021-03-05 DIAGNOSIS — J309 Allergic rhinitis, unspecified: Secondary | ICD-10-CM

## 2021-03-05 DIAGNOSIS — J3089 Other allergic rhinitis: Secondary | ICD-10-CM

## 2021-03-05 MED ORDER — SYMBICORT 160-4.5 MCG/ACT IN AERO: INHALATION_SPRAY | RESPIRATORY_TRACT | 5 refills | Status: DC

## 2021-03-05 NOTE — Patient Instructions (Addendum)
Asthma Continue Qvar 80-2 puffs twice a day with a spacer to prevent cough or wheeze Continue albuterol 2 puffs every 4 hours with a spacer as needed for cough or wheeze You may use albuterol 2 puffs 5-15 minutes before activity to decrease cough or wheeze For asthma flares, begin Symbicort 160-2 puffs twice a day with a spacer for 2 weeks or until back to breathing baseline. Stop Qvar while using Symbicort 160  Allergic rhinitis Continue Xyzal 5 mg once a day as needed for a runny nose For thick post nasal drainage, begin Mucinex [phone] mg twice a day Consider saline nasal rinses as needed for nasal symptoms. Use this before any medicated nasal sprays for best result Continue allergy injections and have access to an epinephrine auto-injector set  Allergic conjunctivitis Continue Pazeo eye drops one drop in each eye once a day as needed for red, itchy eyes. Some over the counter options include Zaditor 1 drop in each eye twice a day as needed for red, itchy eyes OR Opcon-A 2 drops in each eye up to 4 times a day as needed for red, itchy eyes  Call the clinic if this treatment plan is not working well for you  Follow up in 6 months or sooner if needed.

## 2021-03-05 NOTE — Progress Notes (Signed)
[facility] [place] [address] Dept: [phone]  FOLLOW UP NOTE  Patient ID: Jacqueline Holmes, female    DOB: 1971/10/01  Age: 50 y.o. MRN: [id number] Date of Office Visit: 03/05/2021  Assessment  Chief Complaint: Asthma (Feeling a little bit of tightness in her chest but she's missed her Qvar a couple of times. ) and Allergies (Doing pretty good. This week she started more sneezing./)  HPI Jacqueline Holmes is a 50 year old female who presents to the clinic for follow-up visit.  She was last seen in this clinic on 08/23/2020 for evaluation of asthma, allergic rhinitis, and allergic conjunctivitis.  At today's visit she reports her asthma has been well controlled with slight chest tightness for the last week which is beginning to improve.  She reports that she was on vacation last week and forgot to use her Qvar for several days.  At today's visit, she denies shortness of breath, cough, or wheeze.  She continues Qvar 80-2 puffs twice a day and uses her albuterol inhaler infrequently.  Allergic rhinitis is reported as moderately well controlled with nasal congestion, occasional cough, and occasional sneeze.  She continues Xyzal 5 mg once a day.  She reports that she is unable to tolerate any nasal sprays or nasal saline rinse due to nasal swelling after use of these products.  She continues allergen immunotherapy with no large local reactions.  She reports a significant decrease in her symptoms of allergic rhinitis while continuing on allergen immunotherapy.  Allergic conjunctivitis is reported as moderately well controlled with Pazeo eyedrops as needed.  Her current medications are listed in the chart.   Drug Allergies:  No Known Allergies  Physical Exam: BP 134/84 (BP Location: Right Arm, Patient Position: Sitting, Cuff Size: Large)   Pulse 97   LMP 01/28/2017   SpO2 97%    Physical Exam Vitals reviewed.  Constitutional:      Appearance: Normal appearance.  HENT:     Head:  Normocephalic and atraumatic.     Right Ear: Tympanic membrane normal.     Left Ear: Tympanic membrane normal.     Nose:     Comments: Bilateral nares normal.  Pharynx normal.  Ears normal.  Eyes normal.    Mouth/Throat:     Pharynx: Oropharynx is clear.  Eyes:     Conjunctiva/sclera: Conjunctivae normal.  Cardiovascular:     Rate and Rhythm: Normal rate and regular rhythm.     Heart sounds: Normal heart sounds. No murmur heard.   Pulmonary:     Effort: Pulmonary effort is normal.     Breath sounds: Normal breath sounds.     Comments: Lungs clear to auscultation Musculoskeletal:        General: Normal range of motion.     Cervical back: Normal range of motion and neck supple.  Skin:    General: Skin is warm and dry.  Neurological:     Mental Status: She is alert and oriented to person, place, and time.  Psychiatric:        Mood and Affect: Mood normal.        Behavior: Behavior normal.        Thought Content: Thought content normal.        Judgment: Judgment normal.     Diagnostics: FVC 2.77, FEV1 2.02.  Predicted FVC 3.18, predicted FEV1 2.53.  Spirometry indicates normal ventilatory function.  Assessment and Plan: 1. Moderate persistent asthma without complication   2. Seasonal and perennial allergic rhinitis  3. Allergic conjunctivitis of both eyes     Meds ordered this encounter  Medications  . SYMBICORT 160-4.5 MCG/ACT inhaler    Sig: Two puffs with spacer device twice a day during asthma flares.    Dispense:  10.2 g    Refill:  5    Patient Instructions  Asthma Continue Qvar 80-2 puffs twice a day with a spacer to prevent cough or wheeze Continue albuterol 2 puffs every 4 hours with a spacer as needed for cough or wheeze You may use albuterol 2 puffs 5-15 minutes before activity to decrease cough or wheeze For asthma flares, begin Symbicort 160-2 puffs twice a day with a spacer for 2 weeks or until back to breathing baseline. Stop Qvar while using  Symbicort 160  Allergic rhinitis Continue Xyzal 5 mg once a day as needed for a runny nose For thick post nasal drainage, begin Mucinex [phone] mg twice a day Consider saline nasal rinses as needed for nasal symptoms. Use this before any medicated nasal sprays for best result Continue allergy injections and have access to an epinephrine auto-injector set  Allergic conjunctivitis Continue Pazeo eye drops one drop in each eye once a day as needed for red, itchy eyes. Some over the counter options include Zaditor 1 drop in each eye twice a day as needed for red, itchy eyes OR Opcon-A 2 drops in each eye up to 4 times a day as needed for red, itchy eyes  Call the clinic if this treatment plan is not working well for you  Follow up in 6 months or sooner if needed.    Return in about 6 months (around 09/04/2021), or if symptoms worsen or fail to improve.    Thank you for the opportunity to care for this patient.  Please do not hesitate to contact me with questions.  Gareth Morgan, FNP [facility]

## 2021-03-13 ENCOUNTER — Ambulatory Visit (INDEPENDENT_AMBULATORY_CARE_PROVIDER_SITE_OTHER): Payer: BC Managed Care – PPO | Admitting: *Deleted

## 2021-03-13 DIAGNOSIS — J309 Allergic rhinitis, unspecified: Secondary | ICD-10-CM | POA: Diagnosis not present

## 2021-03-21 ENCOUNTER — Ambulatory Visit (INDEPENDENT_AMBULATORY_CARE_PROVIDER_SITE_OTHER): Payer: BC Managed Care – PPO

## 2021-03-21 DIAGNOSIS — J309 Allergic rhinitis, unspecified: Secondary | ICD-10-CM

## 2021-03-27 ENCOUNTER — Other Ambulatory Visit: Payer: Self-pay | Admitting: *Deleted

## 2021-03-27 DIAGNOSIS — J45901 Unspecified asthma with (acute) exacerbation: Secondary | ICD-10-CM

## 2021-03-27 MED ORDER — QVAR REDIHALER 80 MCG/ACT IN AERB: 2.0000 | INHALATION_SPRAY | Freq: Two times a day (BID) | RESPIRATORY_TRACT | 5 refills | Status: DC

## 2021-03-28 ENCOUNTER — Ambulatory Visit (INDEPENDENT_AMBULATORY_CARE_PROVIDER_SITE_OTHER): Payer: BC Managed Care – PPO

## 2021-03-28 DIAGNOSIS — J309 Allergic rhinitis, unspecified: Secondary | ICD-10-CM

## 2021-04-05 ENCOUNTER — Other Ambulatory Visit: Payer: Self-pay | Admitting: Gastroenterology

## 2021-04-05 DIAGNOSIS — R1013 Epigastric pain: Secondary | ICD-10-CM

## 2021-04-05 DIAGNOSIS — K633 Ulcer of intestine: Secondary | ICD-10-CM

## 2021-04-14 ENCOUNTER — Other Ambulatory Visit: Payer: Self-pay

## 2021-04-14 ENCOUNTER — Ambulatory Visit: Payer: BC Managed Care – PPO | Admitting: Medical

## 2021-04-14 ENCOUNTER — Encounter: Payer: Self-pay | Admitting: Medical

## 2021-04-14 VITALS — BP 140/88 | HR 103 | Temp 98.4°F | Ht 61.25 in | Wt 178.2 lb

## 2021-04-14 DIAGNOSIS — M549 Dorsalgia, unspecified: Secondary | ICD-10-CM

## 2021-04-14 DIAGNOSIS — R319 Hematuria, unspecified: Secondary | ICD-10-CM | POA: Diagnosis not present

## 2021-04-14 DIAGNOSIS — R110 Nausea: Secondary | ICD-10-CM | POA: Diagnosis not present

## 2021-04-14 DIAGNOSIS — L723 Sebaceous cyst: Secondary | ICD-10-CM

## 2021-04-14 DIAGNOSIS — R109 Unspecified abdominal pain: Secondary | ICD-10-CM | POA: Diagnosis not present

## 2021-04-14 LAB — POCT URINALYSIS DIP (PROADVANTAGE DEVICE)
Bilirubin, UA: NEGATIVE
Blood, UA: NEGATIVE
Glucose, UA: NEGATIVE mg/dL
Ketones, POC UA: NEGATIVE mg/dL
Leukocytes, UA: NEGATIVE
Nitrite, UA: NEGATIVE
Protein Ur, POC: NEGATIVE mg/dL
Specific Gravity, Urine: 1.025
Urobilinogen, Ur: 0.2
pH, UA: 6 (ref 5.0–8.0)

## 2021-04-14 NOTE — Progress Notes (Signed)
Subjective: Chief Complaint  Patient presents with  . Abdominal Pain    Pain started on Wednesday with bloating and nausea and vomiting. Vaginal spotting started yesterday    Medical Team Dr. [name], PCP Dr. Laurier Nancy, GI Dr. Gala Romney, gyn  Here for abdominal pain.  Was worse but a little better today.  She notes last week felt some abdominal bloating, bleaching, nausea, thought she had some ulcers flaring up.  Had an episode of vomiting.  Felt drainage for a few days, fatigued.   Then yesterday saw blood in urine.  Feels pressure on left med to lower abdomen.  Yesterday saw pink tinge on tissue, possible from urine.    She notes maybe some urine frequency.   Sometimes has urine urgency.    Last UTI long time ago.  No prior kidney stone.  No hx/o pancreatitis.   Drinks some alcohol, had a few last week, but not much in general.  Water intake is good.   Wonders about low grade fever over the last few days, had some chills but not sure if it was due to cold office environment at work.   Still has uterus but both ovaries have been surgical removed.  Last period about 4 years.  No vaginal discharge.  No pain within intercourse.    No other aggravating or relieving factors. No other complaint.    Past Medical History:  Diagnosis Date  . Abnormal uterine bleeding (AUB)    march/april 2021  . Allergy   . Angio-edema   . Asthma   . Cancer ([place])    skin cancer removed   . Cough    non prod  . Diabetes mellitus arising in pregnancy    now pre-diabetes  . Elevated BP without diagnosis of hypertension    high with DOCTOR  visits , then goes back to normal   . Family history of breast cancer    MGM  . Family history of pancreatic cancer   . Family history of prostate cancer   . Fatigue   . Generalized headaches    migraines on occasion.  Marland Kitchen GERD (gastroesophageal reflux disease)   . Graves disease 03/2010  . Heart murmur   . History of COVID-19 06/26/2020  .  Hyperlipidemia   . Hypothyroidism    s/p thyroidectomy for Grave's disease (Dr. Tye Savoy at [facility])  . IBS (irritable bowel syndrome)   . Incontinence   . Infertility, female   . Migraine    without aura  . Plantar fasciitis, bilateral 2021  . Pre-diabetes   . Recurrent upper respiratory infection (URI)    last uri dec 2020  . Sinus problem    runny nose  . Sleep apnea    mild no cpap needed   . Uterine polyp    Current Outpatient Medications on File Prior to Visit  Medication Sig Dispense Refill  . acetaminophen (TYLENOL) 500 MG tablet Take 1,000 mg by mouth every 6 (six) hours as needed for headache. Reported on 06/17/2016    . beclomethasone (QVAR REDIHALER) 80 MCG/ACT inhaler Inhale 2 puffs into the lungs 2 (two) times daily. 10.6 g 5  . estradiol (ESTRACE) 0.5 MG tablet Take 1 tablet (0.5 mg total) by mouth daily. 90 tablet 3  . Fexofenadine HCl (ALLEGRA PO) Take by mouth.    . levocetirizine (XYZAL) 5 MG tablet Take 5 mg by mouth every evening.    Marland Kitchen omeprazole (PRILOSEC) 40 MG capsule Take 1 capsule (40  mg total) by mouth 2 (two) times daily. 60 capsule 2  . PROAIR HFA 108 (90 Base) MCG/ACT inhaler Inhale 2 puffs into the lungs every 4 (four) hours as needed. 8.5 g 1  . progesterone (PROMETRIUM) 100 MG capsule Take 1 capsule (100 mg total) by mouth at bedtime. 90 capsule 3  . Spacer/Aero-Holding Chambers [facility] DIAMOND) DEVI Use as directed with inhaler. Dx:  J45.40 Asthma 1 Device 2  . SYMBICORT 160-4.5 MCG/ACT inhaler Two puffs with spacer device twice a day during asthma flares. 10.2 g 5  . thyroid (NP THYROID) 90 MG tablet Take 1 tablet (90 mg total) by mouth in the morning and at bedtime. 60 tablet 1  . [DISCONTINUED] metFORMIN (GLUCOPHAGE-XR) 500 MG 24 hr tablet Take 2 tablets (1,000 mg total) by mouth daily with breakfast. 1 tablet 0   No current facility-administered medications on file prior to visit.    Past Surgical History:  Procedure  Laterality Date  . ABDOMINOPLASTY  12/2015   Dr. Towanda Malkin  . BREAST REDUCTION SURGERY  11/03/2018  . BREAST SURGERY  10/2018   breast reduction  . CESAREAN SECTION     x 1  . DILATATION & CURETTAGE/HYSTEROSCOPY WITH TRUECLEAR N/A 01/16/2014   Procedure: DILATATION & CURETTAGE/HYSTEROSCOPY WITH TRUCLEAR;  Surgeon: Marylynn Pearson, MD;  Location: [facility];  Service: Gynecology;  Laterality: N/A;  . LASIK    . prk laser eye surgery Left 08/2019  . ROBOTIC ASSISTED SALPINGO OOPHERECTOMY Bilateral 03/28/2020   Procedure: XI ROBOTIC ASSISTED SALPINGO OOPHORECTOMY;;  Surgeon: Lafonda Mosses, MD;  Location: [facility];  Service: Gynecology;  Laterality: Bilateral;  . skin cancer remove  06/2020  . TOTAL THYROIDECTOMY  07/30/11   Dr. Harlow Asa (Grave's disease)  . VULVA Milagros Loll BIOPSY N/A 01/16/2014   Procedure: VULVAR BIOPSY;  Surgeon: Marylynn Pearson, MD;  Location: [facility];  Service: Gynecology;  Laterality: N/A;    ROS as in subjective    Objective: BP 140/88   Pulse (!) 103   Temp 98.4 F (36.9 C)   Ht 5' 1.25" (1.556 m)   Wt 178 lb 3.2 oz (80.8 kg)   LMP 01/28/2017   SpO2 97%   BMI 33.40 kg/m   General appearence: alert, no distress, WD/WN,  Neck: supple, no lymphadenopathy, no thyromegaly, no masses, left posterior neck just inferior to scalp with 1cm nodule density suggestive of sebaceous cyst without erythema induration warmth fluctuance or drainage Heart: RRR, normal S1, S2, no murmurs Lungs: CTA bilaterally, no wheezes, rhonchi, or rales Abdomen: +bs, soft, non tender, non distended, no masses, no hepatomegaly, no splenomegaly Back: nontender, no swelling or deformity Pulses: 2+ symmetric, upper and lower extremities, normal cap refill     Assessment: Encounter Diagnoses  Name Primary?  . Abdominal pain, unspecified abdominal location Yes  . Acute left-sided back pain, unspecified back location   . Hematuria, unspecified type   . Nausea   .  Sebaceous cyst      Plan:  We discussed possible causes for her belly pain.  Labs below, urine culture sent.  Advised if any changes in urine symptoms over the next few days to call back.  No obvious signs of urinary tract infection.  We discussed the sebaceous cyst.  It is not infected or inflamed at this point.  She will leave it alone unless it flares up again.  She does see dermatology and may talk to them about excision   Tyisha was seen today for abdominal pain.  Diagnoses and all orders for this visit:  Abdominal pain, unspecified abdominal location -     POCT Urinalysis DIP (Proadvantage Device) -     Comprehensive metabolic panel -     CBC with Differential/Platelet -     Urine Culture -     Lipase  Acute left-sided back pain, unspecified back location -     Comprehensive metabolic panel -     CBC with Differential/Platelet -     Urine Culture -     Lipase  Hematuria, unspecified type -     Comprehensive metabolic panel -     CBC with Differential/Platelet -     Urine Culture -     Lipase  Nausea -     Comprehensive metabolic panel -     CBC with Differential/Platelet -     Urine Culture -     Lipase  Sebaceous cyst   F/u pending labs

## 2021-04-15 LAB — COMPREHENSIVE METABOLIC PANEL
ALT: 49 IU/L — ABNORMAL HIGH (ref 0–32)
AST: 21 IU/L (ref 0–40)
Albumin/Globulin Ratio: 1.4 (ref 1.2–2.2)
Albumin: 4.1 g/dL (ref 3.8–4.8)
Alkaline Phosphatase: 98 IU/L (ref 44–121)
BUN/Creatinine Ratio: 24 — ABNORMAL HIGH (ref 9–23)
BUN: 18 mg/dL (ref 6–24)
Bilirubin Total: 0.2 mg/dL (ref 0.0–1.2)
CO2: 21 mmol/L (ref 20–29)
Calcium: 9.5 mg/dL (ref 8.7–10.2)
Chloride: 103 mmol/L (ref 96–106)
Creatinine, Ser: 0.75 mg/dL (ref 0.57–1.00)
Globulin, Total: 3 g/dL (ref 1.5–4.5)
Glucose: 96 mg/dL (ref 65–99)
Potassium: 5 mmol/L (ref 3.5–5.2)
Sodium: 140 mmol/L (ref 134–144)
Total Protein: 7.1 g/dL (ref 6.0–8.5)
eGFR: 97 mL/min/{1.73_m2} (ref >59)

## 2021-04-15 LAB — CBC WITH DIFFERENTIAL/PLATELET
Basophils Absolute: 0 10*3/uL (ref 0.0–0.2)
Basos: 0 %
EOS (ABSOLUTE): 0.1 10*3/uL (ref 0.0–0.4)
Eos: 1 %
Hematocrit: 45 % (ref 34.0–46.6)
Hemoglobin: 14.9 g/dL (ref 11.1–15.9)
Immature Grans (Abs): 0 10*3/uL (ref 0.0–0.1)
Immature Granulocytes: 0 %
Lymphocytes Absolute: 2.8 10*3/uL (ref 0.7–3.1)
Lymphs: 33 %
MCH: 28.5 pg (ref 26.6–33.0)
MCHC: 33.1 g/dL (ref 31.5–35.7)
MCV: 86 fL (ref 79–97)
Monocytes Absolute: 0.5 10*3/uL (ref 0.1–0.9)
Monocytes: 6 %
Neutrophils Absolute: 5.1 10*3/uL (ref 1.4–7.0)
Neutrophils: 60 %
Platelets: 355 10*3/uL (ref 150–450)
RBC: 5.22 x10E6/uL (ref 3.77–5.28)
RDW: 13.3 % (ref 11.7–15.4)
WBC: 8.5 10*3/uL (ref 3.4–10.8)

## 2021-04-15 LAB — LIPASE: Lipase: 26 U/L (ref 14–72)

## 2021-04-16 LAB — URINE CULTURE

## 2021-04-24 ENCOUNTER — Ambulatory Visit (INDEPENDENT_AMBULATORY_CARE_PROVIDER_SITE_OTHER): Payer: BC Managed Care – PPO | Admitting: *Deleted

## 2021-04-24 DIAGNOSIS — J309 Allergic rhinitis, unspecified: Secondary | ICD-10-CM

## 2021-05-12 ENCOUNTER — Other Ambulatory Visit: Payer: Self-pay

## 2021-05-12 MED ORDER — LEVOCETIRIZINE DIHYDROCHLORIDE 5 MG PO TABS: 5.0000 mg | ORAL_TABLET | Freq: Every evening | ORAL | 4 refills | Status: DC

## 2021-05-19 ENCOUNTER — Ambulatory Visit (INDEPENDENT_AMBULATORY_CARE_PROVIDER_SITE_OTHER): Payer: BC Managed Care – PPO

## 2021-05-19 DIAGNOSIS — J309 Allergic rhinitis, unspecified: Secondary | ICD-10-CM

## 2021-06-19 ENCOUNTER — Ambulatory Visit (INDEPENDENT_AMBULATORY_CARE_PROVIDER_SITE_OTHER): Payer: BC Managed Care – PPO | Admitting: *Deleted

## 2021-06-19 DIAGNOSIS — J309 Allergic rhinitis, unspecified: Secondary | ICD-10-CM

## 2021-07-01 ENCOUNTER — Other Ambulatory Visit: Payer: Self-pay | Admitting: Gastroenterology

## 2021-07-01 DIAGNOSIS — K633 Ulcer of intestine: Secondary | ICD-10-CM

## 2021-07-01 DIAGNOSIS — R1013 Epigastric pain: Secondary | ICD-10-CM

## 2021-07-17 ENCOUNTER — Ambulatory Visit (INDEPENDENT_AMBULATORY_CARE_PROVIDER_SITE_OTHER): Payer: BC Managed Care – PPO | Admitting: *Deleted

## 2021-07-17 DIAGNOSIS — J309 Allergic rhinitis, unspecified: Secondary | ICD-10-CM

## 2021-08-14 ENCOUNTER — Ambulatory Visit (INDEPENDENT_AMBULATORY_CARE_PROVIDER_SITE_OTHER): Payer: BC Managed Care – PPO | Admitting: *Deleted

## 2021-08-14 DIAGNOSIS — J309 Allergic rhinitis, unspecified: Secondary | ICD-10-CM

## 2021-08-19 DIAGNOSIS — J301 Allergic rhinitis due to pollen: Secondary | ICD-10-CM | POA: Diagnosis not present

## 2021-08-19 NOTE — Progress Notes (Signed)
VIALS MADE. EXP 08-19-22

## 2021-08-27 ENCOUNTER — Encounter: Payer: Self-pay | Admitting: *Deleted

## 2021-09-16 ENCOUNTER — Ambulatory Visit: Payer: BC Managed Care – PPO | Admitting: Obstetrics and Gynecology

## 2021-09-16 NOTE — Progress Notes (Deleted)
50 y.o. G2P1 Married Caucasian female here for annual exam.    PCP:     Patient's last menstrual period was 01/28/2017.           Sexually active: {yes no:314532}  The current method of family planning is post menopausal status.    Exercising: {yes no:314532}  {types:19826} Smoker:  no  Health Maintenance: Pap: 01-22-20 Neg:Neg HR HPV, 09-02-18 Neg:Neg HR HPV, 05/2017 normal per patient History of abnormal Pap:  yes,  ~2005 patient thinks she had a biopsy. MMG: 09-30-20 3D/bil.diag.//Neg/BiRads1 Colonoscopy: ***12-24-20 polyps found;next BMD:   ***  Result  *** TDaP:  04/2018 Gardasil:   no BJY:NWGNF Hep C:01-27-18 Neg Screening Labs:  Hb today: ***, Urine today: ***   reports that she has never smoked. She has never used smokeless tobacco. She reports current alcohol use. She reports that she does not use drugs.  Past Medical History:  Diagnosis Date   Abnormal uterine bleeding (AUB)    march/april 2021   Allergy    Angio-edema    Asthma    Cancer ([place])    skin cancer removed    Cough    non prod   Diabetes mellitus arising in pregnancy    now pre-diabetes   Elevated BP without diagnosis of hypertension    high with DOCTOR  visits , then goes back to normal    Family history of breast cancer    MGM   Family history of pancreatic cancer    Family history of prostate cancer    Fatigue    Generalized headaches    migraines on occasion.   GERD (gastroesophageal reflux disease)    Graves disease 03/2010   Heart murmur    History of COVID-19 06/26/2020   Hyperlipidemia    Hypothyroidism    s/p thyroidectomy for Grave's disease (Dr. Tye Savoy at [place])   IBS (irritable bowel syndrome)    Incontinence    Infertility, female    Migraine    without aura   Plantar fasciitis, bilateral 2021   Pre-diabetes    Recurrent upper respiratory infection (URI)    last uri dec 2020   Sinus problem    runny nose   Sleep apnea    mild no cpap needed    Uterine  polyp     Past Surgical History:  Procedure Laterality Date   ABDOMINOPLASTY  12/2015   Dr. Towanda Malkin   BREAST REDUCTION SURGERY  11/03/2018   BREAST SURGERY  10/2018   breast reduction   CESAREAN SECTION     x 1   DILATATION & CURETTAGE/HYSTEROSCOPY WITH TRUECLEAR N/A 01/16/2014   Procedure: DILATATION & CURETTAGE/HYSTEROSCOPY WITH TRUCLEAR;  Surgeon: Marylynn Pearson, MD;  Location: [facility];  Service: Gynecology;  Laterality: N/A;   LASIK     prk laser eye surgery Left 08/2019   ROBOTIC ASSISTED SALPINGO OOPHERECTOMY Bilateral 03/28/2020   Procedure: XI ROBOTIC ASSISTED SALPINGO OOPHORECTOMY;;  Surgeon: Lafonda Mosses, MD;  Location: [facility];  Service: Gynecology;  Laterality: Bilateral;   skin cancer remove  06/2020   TOTAL THYROIDECTOMY  07/30/11   Dr. Harlow Asa (Grave's disease)   [place] Milagros Loll BIOPSY N/A 01/16/2014   Procedure: VULVAR BIOPSY;  Surgeon: Marylynn Pearson, MD;  Location: [facility];  Service: Gynecology;  Laterality: N/A;    Current Outpatient Medications  Medication Sig Dispense Refill   acetaminophen (TYLENOL) 500 MG tablet Take 1,000 mg by mouth every 6 (six) hours as needed for headache. Reported on  06/17/2016     beclomethasone (QVAR REDIHALER) 80 MCG/ACT inhaler Inhale 2 puffs into the lungs 2 (two) times daily. 10.6 g 5   estradiol (ESTRACE) 0.5 MG tablet Take 1 tablet (0.5 mg total) by mouth daily. 90 tablet 3   Fexofenadine HCl (ALLEGRA PO) Take by mouth.     levocetirizine (XYZAL) 5 MG tablet Take 1 tablet (5 mg total) by mouth every evening. 30 tablet 4   omeprazole (PRILOSEC) 40 MG capsule TAKE ONE CAPSULE BY MOUTH TWICE DAILY. 60 capsule 2   PROAIR HFA 108 (90 Base) MCG/ACT inhaler Inhale 2 puffs into the lungs every 4 (four) hours as needed. 8.5 g 1   progesterone (PROMETRIUM) 100 MG capsule Take 1 capsule (100 mg total) by mouth at bedtime. 90 capsule 3   Spacer/Aero-Holding Chambers (OPTICHAMBER DIAMOND) DEVI Use as directed with  inhaler. Dx:  J45.40 Asthma 1 Device 2   SYMBICORT 160-4.5 MCG/ACT inhaler Two puffs with spacer device twice a day during asthma flares. 10.2 g 5   thyroid (NP THYROID) 90 MG tablet Take 1 tablet (90 mg total) by mouth in the morning and at bedtime. 60 tablet 1   No current facility-administered medications for this visit.    Family History  Problem Relation Age of Onset   Prostate cancer Father        dx. in his 18s   Allergic rhinitis Mother    Asthma Mother    Alpha-1 antitrypsin deficiency Mother    COPD Mother    Diabetes Maternal Grandmother    Pancreatic cancer Maternal Grandmother        dx. in her early 13s   Heart disease Maternal Grandfather        CABG in 60's   Diabetes Maternal Grandfather    Colon polyps Maternal Grandfather    Diabetes Paternal Grandmother    Diabetes Paternal Grandfather    Heart disease Paternal Grandfather    Prostate cancer Paternal Uncle        dx. in his 48s   Angioedema Neg Hx    Eczema Neg Hx    Colon cancer Neg Hx    Esophageal cancer Neg Hx    Rectal cancer Neg Hx    Stomach cancer Neg Hx     Review of Systems  Exam:   LMP 01/28/2017     General appearance: alert, cooperative and appears stated age Head: normocephalic, without obvious abnormality, atraumatic Neck: no adenopathy, supple, symmetrical, trachea midline and thyroid normal to inspection and palpation Lungs: clear to auscultation bilaterally Breasts: normal appearance, no masses or tenderness, No nipple retraction or dimpling, No nipple discharge or bleeding, No axillary adenopathy Heart: regular rate and rhythm Abdomen: soft, non-tender; no masses, no organomegaly Extremities: extremities normal, atraumatic, no cyanosis or edema Skin: skin color, texture, turgor normal. No rashes or lesions Lymph nodes: cervical, supraclavicular, and axillary nodes normal. Neurologic: grossly normal  Pelvic: External genitalia:  no lesions              No abnormal inguinal  nodes palpated.              Urethra:  normal appearing urethra with no masses, tenderness or lesions              Bartholins and Skenes: normal                 Vagina: normal appearing vagina with normal color and discharge, no lesions  Cervix: no lesions              Pap taken: {yes no:314532} Bimanual Exam:  Uterus:  normal size, contour, position, consistency, mobility, non-tender              Adnexa: no mass, fullness, tenderness              Rectal exam: {yes no:314532}.  Confirms.              Anus:  normal sphincter tone, no lesions  Chaperone was present for exam:  ***  Assessment:   Well woman visit with gynecologic exam.   Plan: Mammogram screening discussed. Self breast awareness reviewed. Pap and HR HPV as above. Guidelines for Calcium, Vitamin D, regular exercise program including cardiovascular and weight bearing exercise.   Follow up annually and prn.   Additional counseling given.  {yes [id number]. _______ minutes face to face time of which over 50% was spent in counseling.    After visit summary provided.

## 2021-09-24 ENCOUNTER — Other Ambulatory Visit: Payer: Self-pay | Admitting: *Deleted

## 2021-09-24 ENCOUNTER — Other Ambulatory Visit: Payer: Self-pay

## 2021-09-24 ENCOUNTER — Encounter: Payer: Self-pay | Admitting: Family Medicine

## 2021-09-24 ENCOUNTER — Telehealth: Payer: BC Managed Care – PPO | Admitting: Family Medicine

## 2021-09-24 ENCOUNTER — Other Ambulatory Visit (INDEPENDENT_AMBULATORY_CARE_PROVIDER_SITE_OTHER): Payer: BC Managed Care – PPO

## 2021-09-24 VITALS — Temp 99.4°F | Ht 61.0 in | Wt 185.0 lb

## 2021-09-24 DIAGNOSIS — R509 Fever, unspecified: Secondary | ICD-10-CM | POA: Diagnosis not present

## 2021-09-24 DIAGNOSIS — R059 Cough, unspecified: Secondary | ICD-10-CM | POA: Diagnosis not present

## 2021-09-24 DIAGNOSIS — R0981 Nasal congestion: Secondary | ICD-10-CM | POA: Diagnosis not present

## 2021-09-24 DIAGNOSIS — J4541 Moderate persistent asthma with (acute) exacerbation: Secondary | ICD-10-CM | POA: Diagnosis not present

## 2021-09-24 DIAGNOSIS — J0100 Acute maxillary sinusitis, unspecified: Secondary | ICD-10-CM | POA: Diagnosis not present

## 2021-09-24 DIAGNOSIS — R519 Headache, unspecified: Secondary | ICD-10-CM | POA: Diagnosis not present

## 2021-09-24 DIAGNOSIS — R52 Pain, unspecified: Secondary | ICD-10-CM

## 2021-09-24 DIAGNOSIS — M791 Myalgia, unspecified site: Secondary | ICD-10-CM

## 2021-09-24 MED ORDER — AMOXICILLIN-POT CLAVULANATE 875-125 MG PO TABS: 1.0000 | ORAL_TABLET | Freq: Two times a day (BID) | ORAL | 0 refills | Status: DC

## 2021-09-24 MED ORDER — PREDNISONE 20 MG PO TABS: 20.0000 mg | ORAL_TABLET | Freq: Two times a day (BID) | ORAL | 0 refills | Status: DC

## 2021-09-24 NOTE — Progress Notes (Signed)
Start time: 2:19 End time: 2:39  Virtual Visit via Video Note  I connected with Jacqueline Holmes on 09/24/21 by a video enabled telemedicine application and verified that I am speaking with the correct person using two identifiers.  Location: Patient: home Provider: office   I discussed the limitations of evaluation and management by telemedicine and the availability of in person appointments. The patient expressed understanding and agreed to proceed.  History of Present Illness:  Chief Complaint  Patient presents with   Cough    VIRTUAL cough, nasal congestion, body aches,HA  and fever started last Thursday. Body aches have subsided. Home covid test yesterday that was negative.    The evening of 10/20 she started with fatigue, feeling run down, body aches. 10/21 morning she had congestion, stuffy nose, sneezing, slight coughing. Body aches resolved on Monday. Fatigue persisted Tuesday (10/25) she started with fever, worsening cough, chest congestion.  Taking Mucinex 12 hour (plain), only in the morning and Nyquil at night. Taking occasional tylenol for headaches.  HA was worse earlier in the week. "Tiny headache" at top of head now. Nasal drainage is discolored. Some sinus pressure at her cheeks (started last week, was worse, but still has some discomfort remaining in both cheeks). +PND.  Phlegm is discolored and very thick.  Patient has asthma, under the care of allergist. Has been using albuterol regularly in the last couple of days, per her allergist's instructions for when sick. She does have tightness in her chest. Not noticing much improvement with the inhaler. She also switched from Qvar to Symbicort with this illness (per allergist instructions)  Negative home COVID test yesterday She has had COVID vaccines x 3, and no flu shot yet. Husband had similar illness last week, had two negative COVID tests.  PMH, PSH, SH reviewed  Outpatient Encounter Medications as of 09/24/2021   Medication Sig Note   acetaminophen (TYLENOL) 500 MG tablet Take 1,000 mg by mouth every 6 (six) hours as needed for headache. Reported on 06/17/2016 09/24/2021: Last dose 11:30am   estradiol (ESTRACE) 0.5 MG tablet Take 1 tablet (0.5 mg total) by mouth daily.    Fexofenadine HCl (ALLEGRA PO) Take by mouth.    guaiFENesin (MUCINEX) 600 MG 12 hr tablet Take 600 mg by mouth 2 (two) times daily. 09/24/2021: Last dose 7:30am   levocetirizine (XYZAL) 5 MG tablet Take 1 tablet (5 mg total) by mouth every evening.    omeprazole (PRILOSEC) 40 MG capsule TAKE ONE CAPSULE BY MOUTH TWICE DAILY.    PROAIR HFA 108 (90 Base) MCG/ACT inhaler Inhale 2 puffs into the lungs every 4 (four) hours as needed.    progesterone (PROMETRIUM) 100 MG capsule Take 1 capsule (100 mg total) by mouth at bedtime.    Pseudoeph-Doxylamine-DM-APAP (NYQUIL PO) Take 30 mLs by mouth as needed. 09/24/2021: Last dose last night   Spacer/Aero-Holding Chambers [facility] DIAMOND) DEVI Use as directed with inhaler. Dx:  J45.40 Asthma    SYMBICORT 160-4.5 MCG/ACT inhaler Two puffs with spacer device twice a day during asthma flares.    thyroid (NP THYROID) 90 MG tablet Take 1 tablet (90 mg total) by mouth in the morning and at bedtime.    beclomethasone (QVAR REDIHALER) 80 MCG/ACT inhaler Inhale 2 puffs into the lungs 2 (two) times daily. (Patient not taking: Reported on 09/24/2021)    [DISCONTINUED] metFORMIN (GLUCOPHAGE-XR) 500 MG 24 hr tablet Take 2 tablets (1,000 mg total) by mouth daily with breakfast.    [DISCONTINUED] mupirocin ointment (BACTROBAN) 2 %  Apply 1 application topically daily.    No facility-administered encounter medications on file as of 09/24/2021.   No Known Allergies  ROS: +fever, myalgias, headache, URI symptoms and tightness/wheezing per HPI.  No n/v/d, rash or other concerns.   See HPI    Observations/Objective:  Temp 99.4 F (37.4 C) (Temporal)   Ht 5\' 1"  (1.549 m)   Wt 185 lb (83.9 kg)   LMP  01/28/2017   BMI 34.96 kg/m   Well-appearing, pleasant female, speaking comfortably, in no distress She is alert, oriented, cranial nerves are grossly intact. No coughing or throat-clearing during visit. Exam is limited due to virtual nature of the visit.  COVID negative Influenza A&B negative  Assessment and Plan:  Fever, unspecified fever cause  Myalgia - early in illness, improved  Moderate persistent asthma with acute exacerbation - Plan: predniSONE (DELTASONE) 20 MG tablet  Acute non-recurrent maxillary sinusitis - Plan: amoxicillin-clavulanate (AUGMENTIN) 875-125 MG tablet, predniSONE (DELTASONE) 20 MG tablet  Z-pak -never effective, prefers to avoid this ABX. Sent Augmentin and prednisone 20mg  BID x 5d to treat sinusitis and asthma flare to [place]   Follow Up Instructions:    I discussed the assessment and treatment plan with the patient. The patient was provided an opportunity to ask questions and all were answered. The patient agreed with the plan and demonstrated an understanding of the instructions.   The patient was advised to call back or seek an in-person evaluation if the symptoms worsen or if the condition fails to improve as anticipated.  I spent 25 minutes dedicated to the care of this patient, including pre-visit review of records, face to face time, post-visit ordering of testing and documentation.    Vikki Ports, MD

## 2021-09-24 NOTE — Patient Instructions (Addendum)
Drink plenty of water. Take mucinex twice daily.  Take the prednisone 20mg  twice daily for 5 days. Take the augmentin for 10 days, to treat sinus infection.  Seek re-evaluation if you have persistent fevers, worsening shortness of breath, pain with breathing, or other new/concerning symptoms.  Schedule a nurse visit for your flu shot and bivalent COVID booster when you are feeling better.  Your flu and COVID tests today were negative.  You will be sent a work note.

## 2021-09-25 ENCOUNTER — Telehealth: Payer: BC Managed Care – PPO | Admitting: Family Medicine

## 2021-09-25 ENCOUNTER — Encounter: Payer: Self-pay | Admitting: Family Medicine

## 2021-09-25 LAB — POCT INFLUENZA A/B
Influenza A, POC: NEGATIVE
Influenza B, POC: NEGATIVE

## 2021-09-25 LAB — POC COVID19 BINAXNOW: SARS Coronavirus 2 Ag: NEGATIVE

## 2021-09-25 LAB — SARS-COV-2, NAA 2 DAY TAT

## 2021-09-25 LAB — NOVEL CORONAVIRUS, NAA: SARS-CoV-2, NAA: NOT DETECTED

## 2021-09-25 NOTE — Progress Notes (Signed)
Letter sent.

## 2021-09-29 ENCOUNTER — Encounter: Payer: Self-pay | Admitting: Obstetrics and Gynecology

## 2021-09-30 ENCOUNTER — Other Ambulatory Visit: Payer: Self-pay | Admitting: Obstetrics and Gynecology

## 2021-09-30 ENCOUNTER — Ambulatory Visit: Payer: BC Managed Care – PPO | Admitting: Obstetrics and Gynecology

## 2021-10-01 ENCOUNTER — Telehealth: Payer: Self-pay | Admitting: Family Medicine

## 2021-10-01 NOTE — Telephone Encounter (Signed)
I'd like to see her in-person.  She had negative tests at her visit last week.  She should still be taking Augmentin (should have been 10 day course), but have completed the prednisone.  Needs exam in office. Thanks.

## 2021-10-01 NOTE — Telephone Encounter (Signed)
Annual exam scheduled on 01/01/22 Last annual exam was on 09/10/2020 Last mammogram was on 08/2021

## 2021-10-01 NOTE — Telephone Encounter (Signed)
Pt called and stated that as of her last visit she was starting to feel better. But yesterday she started running a fever again and has chest tightness.Do you want me to schedule pt a virtual appt or do a different antibiotic?

## 2021-10-02 ENCOUNTER — Encounter: Payer: Self-pay | Admitting: Family Medicine

## 2021-10-02 ENCOUNTER — Ambulatory Visit
Admission: RE | Admit: 2021-10-02 | Discharge: 2021-10-02 | Disposition: A | Discharge status: Home / Self Care | Payer: BC Managed Care – PPO | Source: Ambulatory Visit | Attending: Family Medicine | Admitting: Family Medicine

## 2021-10-02 ENCOUNTER — Ambulatory Visit: Payer: BC Managed Care – PPO | Admitting: Family Medicine

## 2021-10-02 ENCOUNTER — Other Ambulatory Visit: Payer: Self-pay

## 2021-10-02 VITALS — BP 138/88 | HR 96 | Temp 99.3°F | Ht 61.25 in | Wt 186.8 lb

## 2021-10-02 DIAGNOSIS — R059 Cough, unspecified: Secondary | ICD-10-CM

## 2021-10-02 DIAGNOSIS — R509 Fever, unspecified: Secondary | ICD-10-CM | POA: Diagnosis not present

## 2021-10-02 DIAGNOSIS — J4541 Moderate persistent asthma with (acute) exacerbation: Secondary | ICD-10-CM

## 2021-10-02 DIAGNOSIS — R0602 Shortness of breath: Secondary | ICD-10-CM

## 2021-10-02 IMAGING — CR DG CHEST 2V
2 series · 2 of 2 positions shown · non-contrast
Comparison: [DATE]

CLINICAL DATA: Ongoing febrile illness, cough, shortness of breath,
symptoms despite antibiotics, history asthma

EXAM:
CHEST - 2 VIEW

[w chest pa]
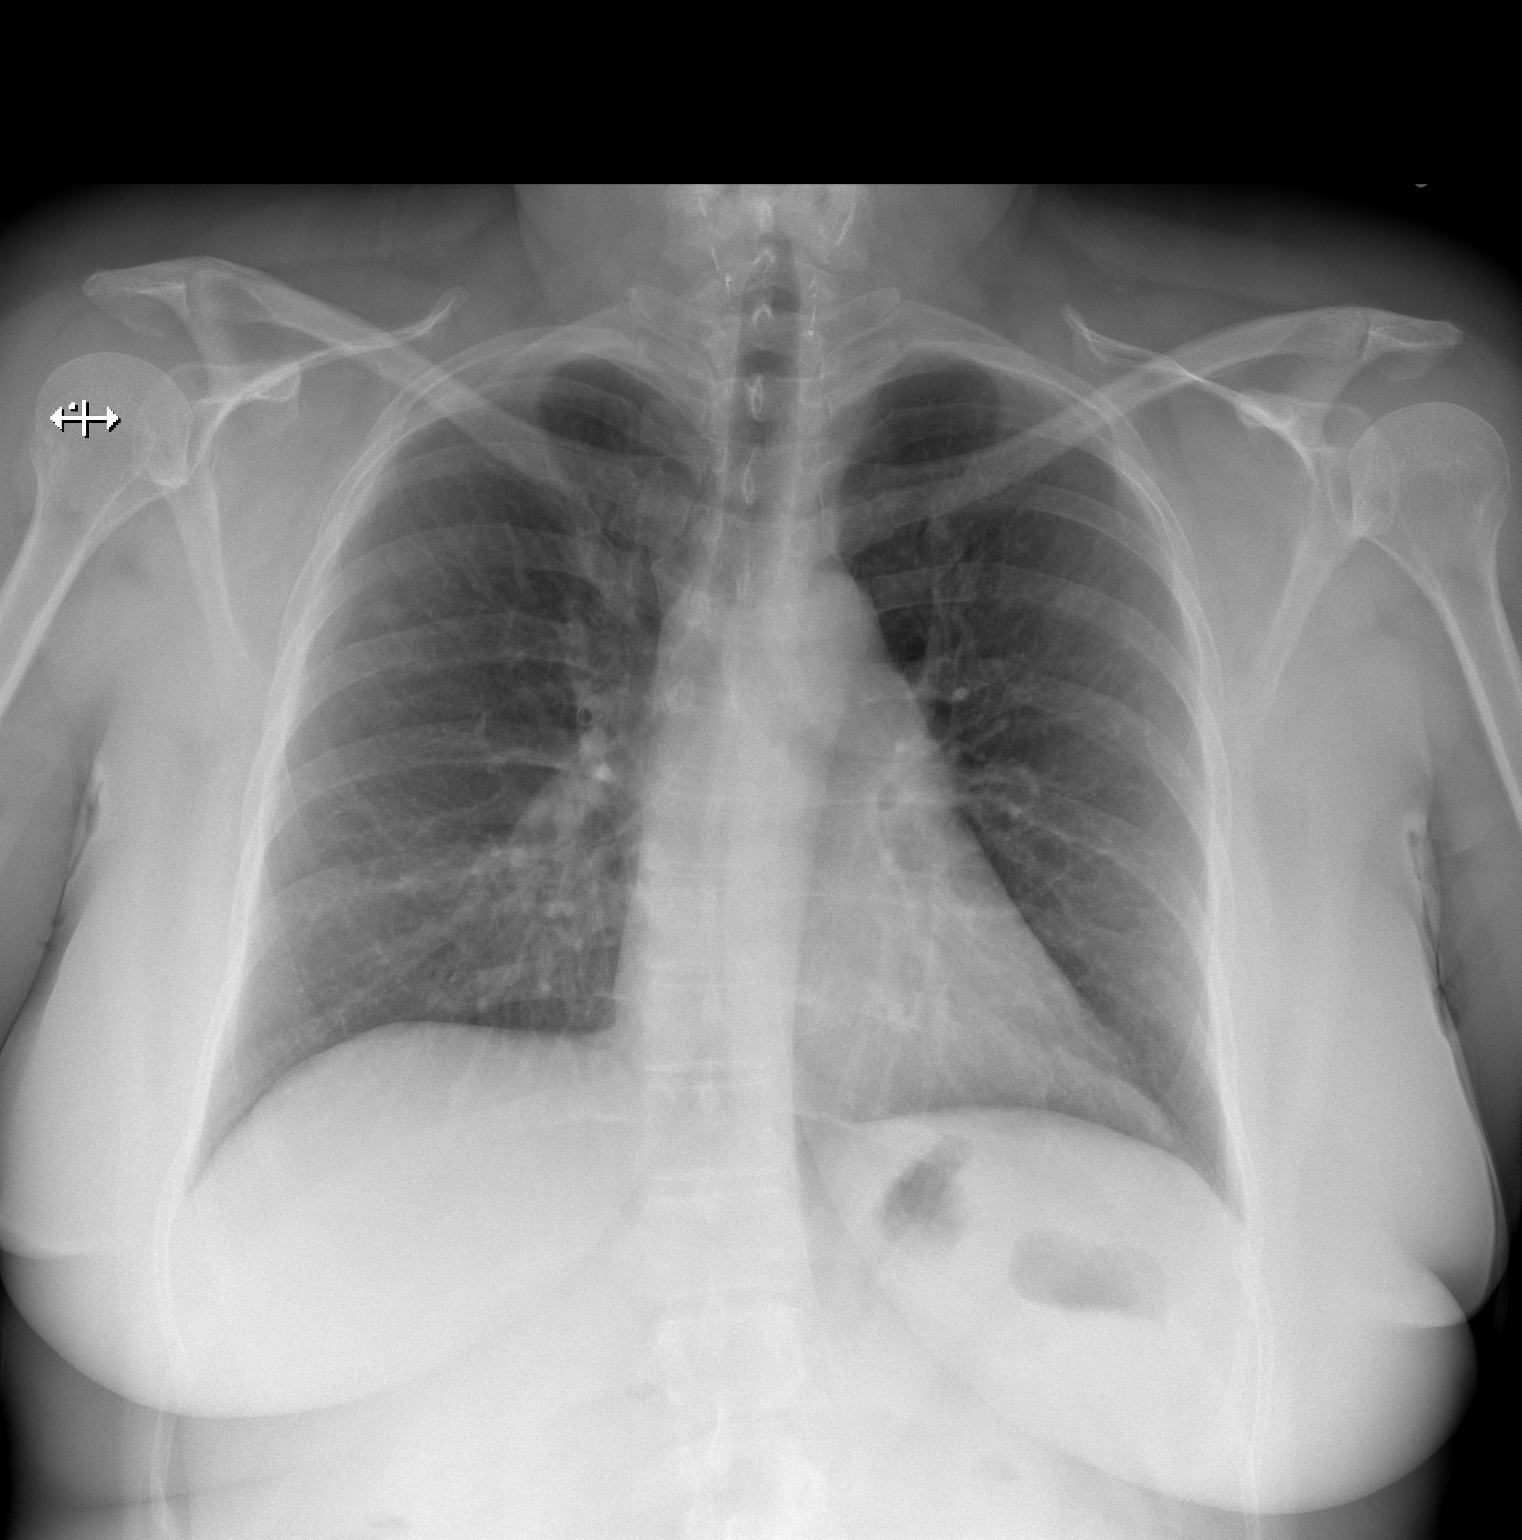

[w chest lat]
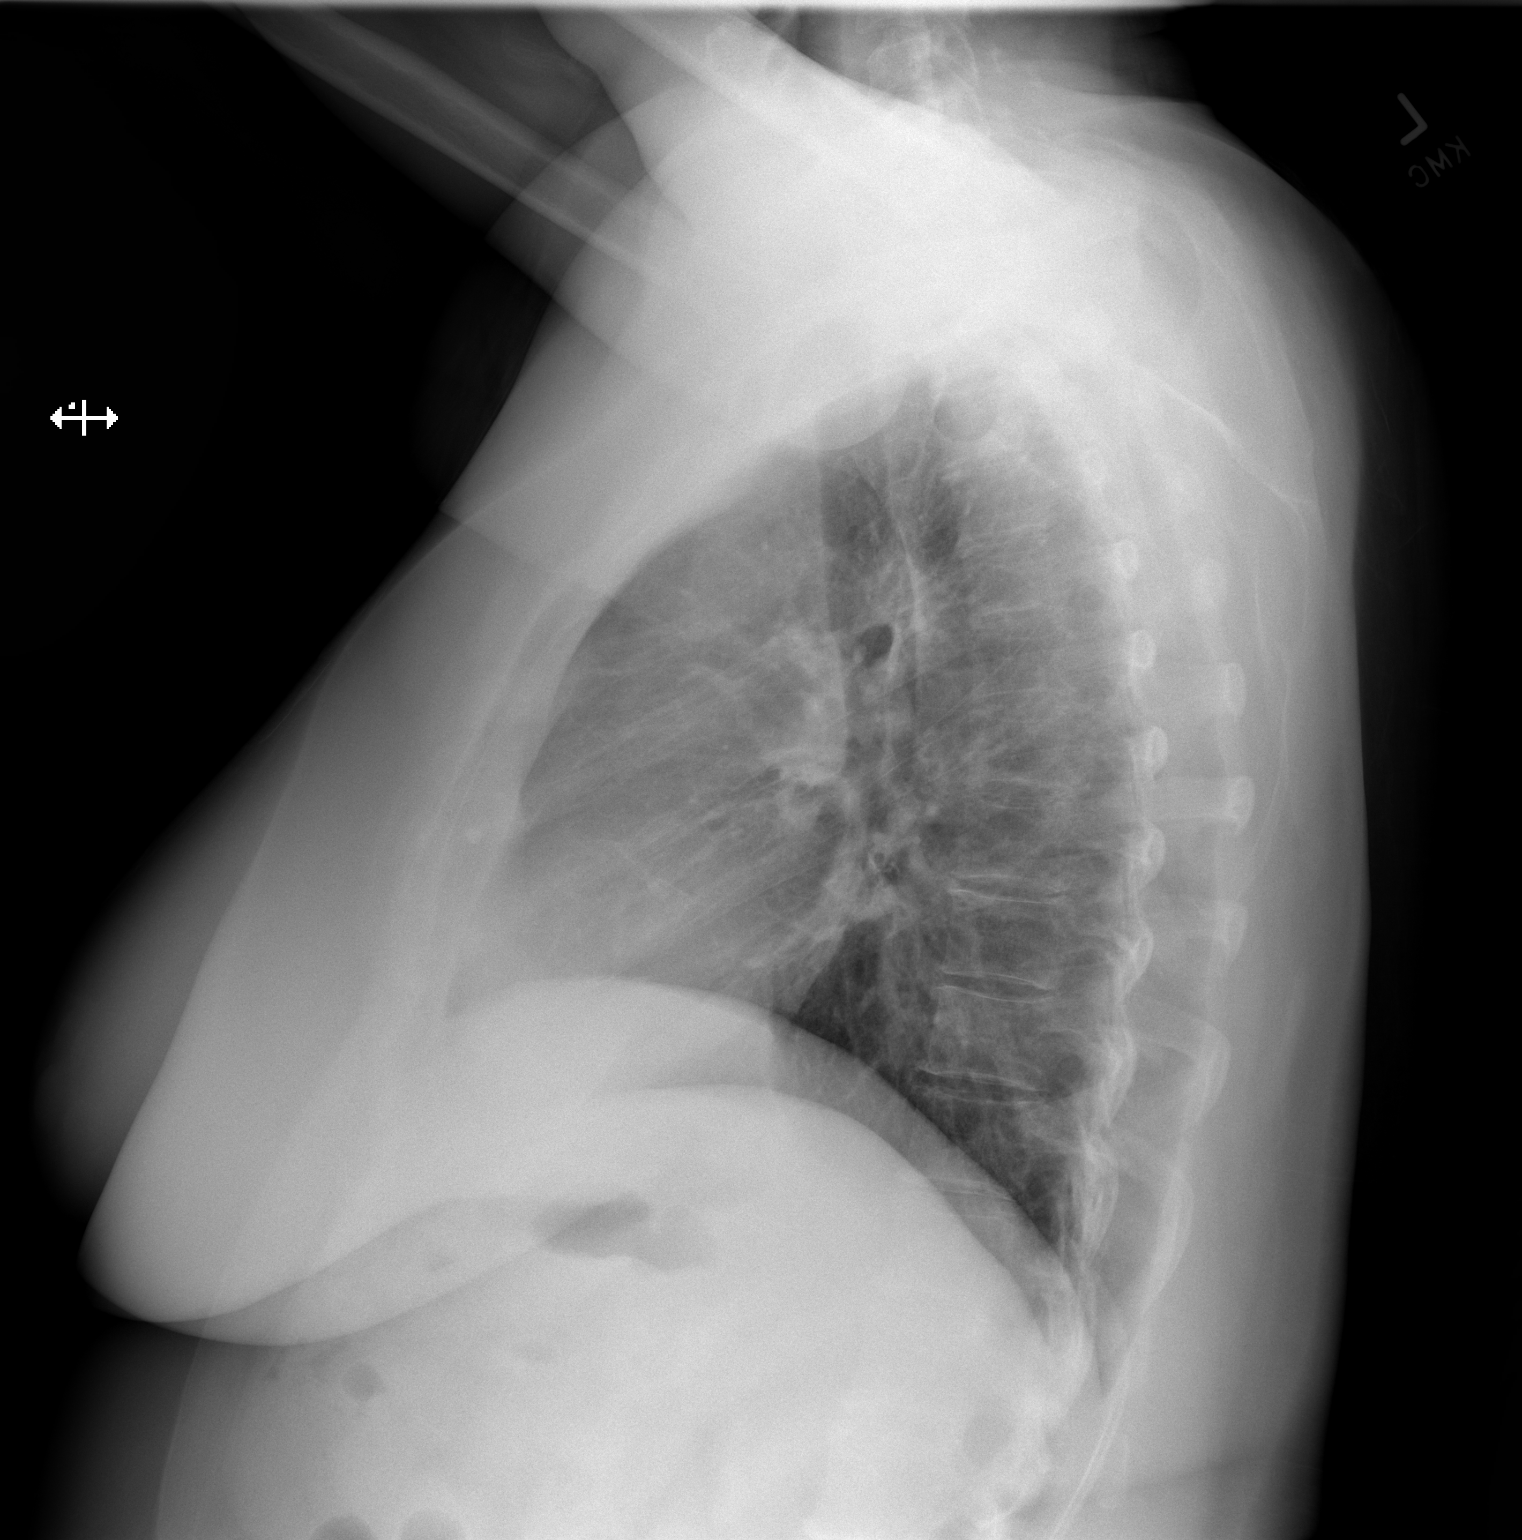

[2 of 2 positions shown; findings below may reference images not displayed]

FINDINGS: Normal heart size, mediastinal contours, and pulmonary vascularity.

Minimal chronic peribronchial thickening.

Lungs otherwise clear.

No pulmonary infiltrate, pleural effusion, or pneumothorax.

Faint nodular density RIGHT upper lobe 8 mm diameter

Surgical clips in the cervical region bilaterally consistent with
history of thyroidectomy.

Broad-based dextroconvex thoracic scoliosis without acute osseous
findings.
IMPRESSION: Minimal chronic bronchitic changes.

Faint 8 mm nodular density RIGHT upper lobe, pulmonary nodule not
excluded; CT chest recommended to exclude pulmonary nodule.

## 2021-10-02 MED ORDER — AZITHROMYCIN 250 MG PO TABS: ORAL_TABLET | ORAL | 0 refills | Status: DC

## 2021-10-02 MED ORDER — PREDNISONE 10 MG PO TABS: ORAL_TABLET | ORAL | 0 refills | Status: DC

## 2021-10-02 NOTE — Patient Instructions (Signed)
Stay well hydrated. Continue the plain mucinex twice daily (if 12 hour) Do NOT take robitussin DM with the mucinex (doubling up on guaifenesin). Read the Nyquil label to make sure no duplicate meds. Continue tylenol as needed for fever or pain  Go to [facility] for chest x-ray. Based on those results, we may add another antibiotic. I think your worsening of breathing and chest discomfort is worse now since the prednisone finished. I think giving a longer taper would benefit you.

## 2021-10-02 NOTE — Progress Notes (Addendum)
Chief Complaint  Patient presents with   Follow-up    Saw Dr. Tomi Bamberger last Wed virtually. Was feeling better. Ran fever Tuesday and all day yesterday. New symptoms include burning in chest and SOB, still has runny nose. Feels worse than she did last Wednesday.     Patient is seen for follow-up on illness.  She was seen for a virtual visit on 10/26, symptoms had started 10/20.  She started feeling a little better (myalgias resolved, still fatigued) on 10/24, but on 10/25 she started with fever, worsening cough, chest congestion.  She was having purulent drainage, sinus pressure, thick PND.  COVID and flu tests were negative.    Patient has asthma, under the care of allergist and she had increased her albuterol use during illness per her allergist's instructions, along with switching from Qvar to Symbicort. She hadn't noticed much improvement in the chest tightness.  She was prescribed Augmentin x 10d and prednisone 20mg  BID x 5d to treat sinusitis and asthma flare.  She has been on ABX for over a week now. She felt better on 10/29--no further fever, just slight congestion, loosening up, cough was better, sleeping at night. 10/30 she was a little more tired, thought she over-extended herself the day prior She felt okay on Monday. Tuesday (2 days ago) she woke up with cough, felt more tired, recurrent fever, max of 101.  She has had persistent fever (didn't have one at home this morning but had taken nyquil at 4 am).  She has recurrent tightness in her chest, burns when she takes a deep breath--recurred after completing the steroids. Using albuterol every 4 hours, more preventatively, unsure if it is helping.  Cough is usually dry--today feels a little looser, hasn't gotten anything up. Mucus from the nose is now mostly clear (only rarely sees anything discolored)  She has been taking Robitussin DM at least 3-4x/day.  She ran out, and is back to Nyquil. She had also been taking Mucinex plain, 12  hour, in addition to the Robitussin.  She has had COVID vaccines x 3, and no flu shot yet. Husband had similar illness last week, had two negative COVID tests.  PMH, PSH, SH reviewed  Outpatient Encounter Medications as of 10/02/2021  Medication Sig Note   acetaminophen (TYLENOL) 500 MG tablet Take 1,000 mg by mouth every 6 (six) hours as needed for headache. Reported on 06/17/2016 10/02/2021: Took at 3am   amoxicillin-clavulanate (AUGMENTIN) 875-125 MG tablet Take 1 tablet by mouth 2 (two) times daily.    azithromycin (ZITHROMAX) 250 MG tablet Take 2 tablets by mouth on first day, then 1 tablet by mouth on days 2 through 5    Fexofenadine HCl (ALLEGRA PO) Take by mouth. 10/02/2021: Last dose 8:00am   guaiFENesin (MUCINEX) 600 MG 12 hr tablet Take 600 mg by mouth 2 (two) times daily. 10/02/2021: Last dose 8:00am   levocetirizine (XYZAL) 5 MG tablet Take 1 tablet (5 mg total) by mouth every evening.    omeprazole (PRILOSEC) 40 MG capsule TAKE ONE CAPSULE BY MOUTH TWICE DAILY.    predniSONE (DELTASONE) 10 MG tablet Take 4 tablets with food once daily for 2 days, then 3 tablets for 2 days, then 2 tablets for 2 days, then 1 tablet for 2 days.    PROAIR HFA 108 (90 Base) MCG/ACT inhaler Inhale 2 puffs into the lungs every 4 (four) hours as needed. 10/02/2021: Last used 9am   Pseudoeph-Doxylamine-DM-APAP (NYQUIL PO) Take 30 mLs by mouth as needed. 10/02/2021:  Last dose 2am   Spacer/Aero-Holding Chambers (OPTICHAMBER DIAMOND) DEVI Use as directed with inhaler. Dx:  J45.40 Asthma (Patient taking differently: Use as directed with inhaler. Dx:  J45.40 Asthma)    SYMBICORT 160-4.5 MCG/ACT inhaler Two puffs with spacer device twice a day during asthma flares.    thyroid (NP THYROID) 90 MG tablet Take 1 tablet (90 mg total) by mouth in the morning and at bedtime.    [DISCONTINUED] estradiol (ESTRACE) 0.5 MG tablet Take 1 tablet (0.5 mg total) by mouth daily.    [DISCONTINUED] progesterone (PROMETRIUM) 100 MG  capsule Take 1 capsule (100 mg total) by mouth at bedtime.    beclomethasone (QVAR REDIHALER) 80 MCG/ACT inhaler Inhale 2 puffs into the lungs 2 (two) times daily. (Patient not taking: No sig reported)    [DISCONTINUED] metFORMIN (GLUCOPHAGE-XR) 500 MG 24 hr tablet Take 2 tablets (1,000 mg total) by mouth daily with breakfast.    [DISCONTINUED] predniSONE (DELTASONE) 20 MG tablet Take 1 tablet (20 mg total) by mouth 2 (two) times daily with a meal. (Patient not taking: Reported on 10/02/2021) 10/02/2021: Finished on Monday   No facility-administered encounter medications on file as of 10/02/2021.   NOT taking zpak and had completed initial prednisone course prior to visit  No Known Allergies  ROS: See HPI re: fever, cough.  No n/v/d. No urinary complaints, rashes. Decreased appetite yesterday, fatigue. No longer having sinus pain. Had some ST 2 days ago, not now.   PHYSICAL EXAM:  BP 138/88   Pulse 96   Temp 99.3 F (37.4 C) (Tympanic)   Ht 5' 1.25" (1.556 m)   Wt 186 lb 12.8 oz (84.7 kg)   LMP 01/28/2017   BMI 35.01 kg/m   Wt Readings from Last 3 Encounters:  10/02/21 186 lb 12.8 oz (84.7 kg)  09/24/21 185 lb (83.9 kg)  04/14/21 178 lb 3.2 oz (80.8 kg)    Appears tired, moderately congested with some sniffling and occasional dry cough. HEENT: PERRL, EOMI, conjunctiva and sclera are clear, EOMI. TM's and EAC's normal. Nasal mucosa is moderately edematous on the L, normal on the right, clear mucus in both nares. Sinuses are nontender.  OP is clear (mouth is small, but visualized portion with tongue depressor all normal). Neck: no lymphadenopathy or mass Heart: regular rate and rhythm (borderline tachycardia) Lungs: good air movement, slightly coarse breath sounds, but some clearing with cough.  No wheezing or rales noted. Neuro: alert and oriented, cranial nerves grossly intact. Normal gait. Psych: normal mood, affect, hygiene and grooming    ASSESSMENT/PLAN:  Fever,  unspecified fever cause - recurrent fever, despite improvement in purulent sinus drainage, and still on ABX.  Check CXR. Likely will add coverage for atypicals - Plan: DG Chest 2 View, azithromycin (ZITHROMAX) 250 MG tablet  Cough, unspecified type - Plan: DG Chest 2 View, azithromycin (ZITHROMAX) 250 MG tablet  Shortness of breath - burning in chest ,mild shortness of breath.  Lungs sound clear, bronchitis most likely.  Will add further steroid taper - Plan: DG Chest 2 View, predniSONE (DELTASONE) 10 MG tablet  Moderate persistent asthma with acute exacerbation - Plan: predniSONE (DELTASONE) 10 MG tablet  CXR Consider adding doxy vs z-pak Complete the augmentin  Stay well hydrated. Continue the plain mucinex twice daily (if 12 hour) Do NOT take robitussin DM with the mucinex (doubling up on guaifenesin). Read the Nyquil label to make sure no duplicate meds. Continue tylenol as needed for fever or pain  Go to [facility] for chest x-ray. Based on those results, we may add another antibiotic. I think your worsening of breathing and chest discomfort is worse now since the prednisone finished. I think giving a longer taper would benefit you.   ADDENDUM: CXR: IMPRESSION: Minimal chronic bronchitic changes. Faint 8 mm nodular density RIGHT upper lobe, pulmonary nodule not excluded; CT chest recommended to exclude pulmonary nodule.  Z-pak, pred taper. Will need f/u on poss nodule.  Rec f/u 1-2 weeks.

## 2021-10-12 NOTE — Progress Notes (Signed)
Chief Complaint  Patient presents with   Follow-up    Follow up on URI and CXR. Thinks she now has yeast infection from the abx, maybe the end of a yeast infection. Slight white discharge, itching. Also having pain and frequency with urination.    Patient is seen for follow-up on illness.  She was seen for a virtual visit on 10/26, symptoms had started 10/20.  She started feeling a little better (myalgias resolved, still fatigued) on 10/24, but on 10/25 she started with fever, worsening cough, chest congestion.  She was having purulent drainage, sinus pressure, thick PND.  COVID and flu tests were negative. Patient has asthma, under the care of allergist, and had switched from Qvar to Symbicort and started taking her albuterol regularly per his recommendations for what she should do when sick.  She hadn't noticed much improvement in chest tightness with those measures.  She was prescribed Augmentin x 10d and prednisone 28m BID x 5d to treat sinusitis and asthma flare.  She was last seen 11/3, when she had been on ABX for over a week. She had initially improved, but then had recurrent fever, tightness and burning in chest.  Seemed to have gotten worse after completing the steroids.  Mucus from the nose was clear, cough was nonproductive.   She was sent for CXR which showed: IMPRESSION: Minimal chronic bronchitic changes. Faint 8 mm nodular density RIGHT upper lobe, pulmonary nodule not excluded; CT chest recommended to exclude pulmonary nodule.  Zpak was added (and to complete the Augmentin), and she was given a longer steroid taper.  Today she reports her respiratory symptoms are much better.  She still has slight cough (occ coughs up whitish phlegm).  No longer having any nasal drainage, denies sinus pain. Denies any chest pain, shortness of breath.  She has decreased her use of albuterol.  Today she reports noting a vaginal discharge and itching, which started the middle of last week. Discharge  is thick and white, denies odor. Yesterday she started with urinary frequency and pain with urination (pain in vaginal area, internally, not external).  She denies any abdominal pain. Denies flank pain. No recurrent fever.    PMH, PSH, SH reviewed  Outpatient Encounter Medications as of 10/13/2021  Medication Sig Note   acetaminophen (TYLENOL) 500 MG tablet Take 1,000 mg by mouth every 6 (six) hours as needed for headache. Reported on 06/17/2016 10/13/2021: Last dose this morning at 8:30am   estradiol (ESTRACE) 0.5 MG tablet TAKE 1 TABLET ONCE DAILY.    Fexofenadine HCl (ALLEGRA PO) Take by mouth.    fluconazole (DIFLUCAN) 150 MG tablet Take 1 tablet (150 mg total) by mouth once for 1 dose. Take 1 tablet by mouth for yeast infection.  Repeat in 1 week if needed    levocetirizine (XYZAL) 5 MG tablet Take 1 tablet (5 mg total) by mouth every evening.    omeprazole (PRILOSEC) 40 MG capsule TAKE ONE CAPSULE BY MOUTH TWICE DAILY.    PROAIR HFA 108 (90 Base) MCG/ACT inhaler Inhale 2 puffs into the lungs every 4 (four) hours as needed. 10/13/2021: Last used last night   progesterone (PROMETRIUM) 100 MG capsule TAKE 1 CAPSULE AT BEDTIME.    Spacer/Aero-Holding Chambers ([facility]) DEVI Use as directed with inhaler. Dx:  J45.40 Asthma (Patient taking differently: Use as directed with inhaler. Dx:  J45.40 Asthma)    SYMBICORT 160-4.5 MCG/ACT inhaler Two puffs with spacer device twice a day during asthma flares.    thyroid (  NP THYROID) 90 MG tablet Take 1 tablet (90 mg total) by mouth in the morning and at bedtime.    [DISCONTINUED] Pseudoeph-Doxylamine-DM-APAP (NYQUIL PO) Take 30 mLs by mouth as needed. 10/02/2021: Last dose 2am   beclomethasone (QVAR REDIHALER) 80 MCG/ACT inhaler Inhale 2 puffs into the lungs 2 (two) times daily. (Patient not taking: No sig reported)    guaiFENesin (MUCINEX) 600 MG 12 hr tablet Take 600 mg by mouth 2 (two) times daily. (Patient not taking: Reported on  10/13/2021)    [DISCONTINUED] amoxicillin-clavulanate (AUGMENTIN) 875-125 MG tablet Take 1 tablet by mouth 2 (two) times daily.    [DISCONTINUED] azithromycin (ZITHROMAX) 250 MG tablet Take 2 tablets by mouth on first day, then 1 tablet by mouth on days 2 through 5    [DISCONTINUED] metFORMIN (GLUCOPHAGE-XR) 500 MG 24 hr tablet Take 2 tablets (1,000 mg total) by mouth daily with breakfast.    [DISCONTINUED] predniSONE (DELTASONE) 10 MG tablet Take 4 tablets with food once daily for 2 days, then 3 tablets for 2 days, then 2 tablets for 2 days, then 1 tablet for 2 days.    No facility-administered encounter medications on file as of 10/13/2021.   NOT taking diflucan prior to today's visit  No Known Allergies   ROS: no fever, chills.  URI symptoms resolving, slight residual cough. No shortness of breath, chest pain, n/v/d.  +vaginal discharge and itching, +urinary complaints, see HPI.    PHYSICAL EXAM:  BP 140/80   Pulse 92   Temp 98.8 F (37.1 C) (Tympanic)   Ht 5' 1.25" (1.556 m)   Wt 187 lb (84.8 kg)   LMP 01/28/2017   BMI 35.05 kg/m   Wt Readings from Last 3 Encounters:  10/13/21 187 lb (84.8 kg)  10/02/21 186 lb 12.8 oz (84.7 kg)  09/24/21 185 lb (83.9 kg)   Well-appearing, pleasant female, in good spirits, in no distress. Doesn't sound congested, no coughing during visit. HEENT: PERRL, EOMI, conjunctiva and sclera are clear, EOMI. Wearing mask. Neck: no lymphadenopathy or mass Heart: regular rate and rhythm (borderline tachycardia, also noted at last visit) Lungs: good air movement, no wheezing, ronchi or rales noted. Neuro: alert and oriented, cranial nerves grossly intact. Normal gait. Psych: normal mood, affect, hygiene and grooming External genitalia:  There is very mild erythema at labia minora, with some thick white discharge noted . Tissue around urethra appears normal.  Urine dip: 3+ blood, 2+ protein, 1+ leuks, 1+ bili, SG 1.030  ASSESSMENT/PLAN:  Yeast  vaginitis - presumptively based on appearance of discharge and h/o recent ABX. Diflucan once now; repeat in 1 week if not better (or prn with future infections) - Plan: fluconazole (DIFLUCAN) 150 MG tablet  Urinary frequency - urine cx sent.  u/a could be contam from discharge. Urine very concentrated, encouraged increased water intake - Plan: POCT Urinalysis DIP (Proadvantage Device), Urine Culture  Burning with urination - may be related to irritation from yeast infection. Sending culture to r/o UTI - Plan: POCT Urinalysis DIP (Proadvantage Device)  Abnormal urinalysis - Plan: Urine Culture  Abnormal findings on diagnostic imaging of lung - poss faint 46m nodule noted on CXR.  Check CT. Pt low risk, never smoker.  - Plan: CT Chest Wo Contrast  Discussed possible nodule. She met her deductible and would like to have the CT done   Diflucan rx sent.  Recalls Monistat causing a lot of burning when she tried it in the past. Likely to cause again, since there is  some inflammation noted--advised she may be able to try this early on in yeast infections in the future.  I spent 34 minutes dedicated to the care of this patient, including pre-visit review of records, face to face time, post-visit ordering of testing and documentation.

## 2021-10-13 ENCOUNTER — Other Ambulatory Visit: Payer: Self-pay

## 2021-10-13 ENCOUNTER — Encounter: Payer: Self-pay | Admitting: Family Medicine

## 2021-10-13 ENCOUNTER — Ambulatory Visit: Payer: BC Managed Care – PPO | Admitting: Family Medicine

## 2021-10-13 VITALS — BP 140/80 | HR 92 | Temp 98.8°F | Ht 61.25 in | Wt 187.0 lb

## 2021-10-13 DIAGNOSIS — R300 Dysuria: Secondary | ICD-10-CM | POA: Diagnosis not present

## 2021-10-13 DIAGNOSIS — R350 Frequency of micturition: Secondary | ICD-10-CM

## 2021-10-13 DIAGNOSIS — R8290 Unspecified abnormal findings in urine: Secondary | ICD-10-CM | POA: Diagnosis not present

## 2021-10-13 DIAGNOSIS — R918 Other nonspecific abnormal finding of lung field: Secondary | ICD-10-CM

## 2021-10-13 DIAGNOSIS — B3731 Acute candidiasis of vulva and vagina: Secondary | ICD-10-CM | POA: Diagnosis not present

## 2021-10-13 LAB — POCT URINALYSIS DIP (PROADVANTAGE DEVICE)
Glucose, UA: NEGATIVE mg/dL
Ketones, POC UA: NEGATIVE mg/dL
Nitrite, UA: NEGATIVE
Protein Ur, POC: 100 mg/dL — AB
Specific Gravity, Urine: 1.03
Urobilinogen, Ur: NEGATIVE
pH, UA: 5.5 (ref 5.0–8.0)

## 2021-10-13 MED ORDER — FLUCONAZOLE 150 MG PO TABS: 150.0000 mg | ORAL_TABLET | Freq: Once | ORAL | 0 refills | Status: AC

## 2021-10-13 NOTE — Patient Instructions (Addendum)
Your urine was very concentrated--please try and drink more water. There was blood, protein and some white cells noted.  I suspect this is all related to the yeast infection and inflammation (contaminating the urine).  But, in case there may be a bladder infection (causing burning with urination), I'm sending the urine culture off.  This report may be delayed, so please contact us if the burning or pain with urination gets worse, if you develop lower abdominal pain, fever or flank pain.  I'd rather treat presumptively while waiting if symptoms are getting more typical for a bladder infection.  We are ordering a chest CT to evaluate the possible nodule noted on chest x-ray. Let us know if you don't hear from anybody within a week regarding scheduling this test.

## 2021-10-14 ENCOUNTER — Other Ambulatory Visit: Payer: Self-pay | Admitting: Gastroenterology

## 2021-10-14 ENCOUNTER — Other Ambulatory Visit: Payer: Self-pay | Admitting: *Deleted

## 2021-10-14 DIAGNOSIS — K633 Ulcer of intestine: Secondary | ICD-10-CM

## 2021-10-14 DIAGNOSIS — R1013 Epigastric pain: Secondary | ICD-10-CM

## 2021-10-14 MED ORDER — ALBUTEROL SULFATE HFA 108 (90 BASE) MCG/ACT IN AERS: 2.0000 | INHALATION_SPRAY | RESPIRATORY_TRACT | 0 refills | Status: DC | PRN

## 2021-10-17 ENCOUNTER — Encounter: Payer: Self-pay | Admitting: Family Medicine

## 2021-10-18 ENCOUNTER — Ambulatory Visit
Admission: RE | Admit: 2021-10-18 | Discharge: 2021-10-18 | Disposition: A | Discharge status: Home / Self Care | Payer: BC Managed Care – PPO | Source: Ambulatory Visit | Attending: Family Medicine | Admitting: Family Medicine

## 2021-10-18 ENCOUNTER — Other Ambulatory Visit: Payer: Self-pay

## 2021-10-18 DIAGNOSIS — R918 Other nonspecific abnormal finding of lung field: Secondary | ICD-10-CM

## 2021-10-18 LAB — URINE CULTURE

## 2021-10-18 IMAGING — CT CT CHEST W/O CM
1 series · 15 of 34 positions shown, 19 images · non-contrast
Comparison: Chest x-ray [DATE]

CLINICAL DATA: Suspected lung nodule on chest x-ray right upper
lobe.

EXAM:
CT CHEST WITHOUT CONTRAST
TECHNIQUE: Multidetector CT imaging of the chest was performed following the
standard protocol without IV contrast.

[Series 2: chest w/(date) · axial · 0.67mm/px · z∈[-322,-56]mm · 15 of 157 slices shown, 19 images]
[im 12/157  mediastinal]
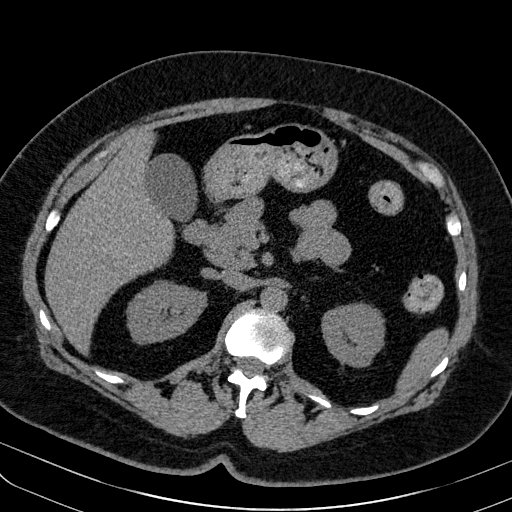
[im 12/157  lung]
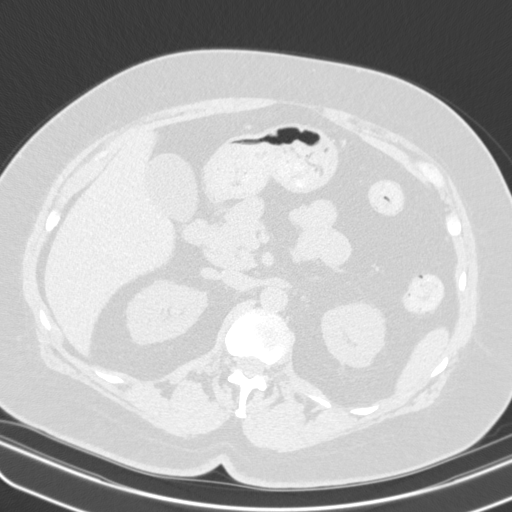
[im 24/157  lung]
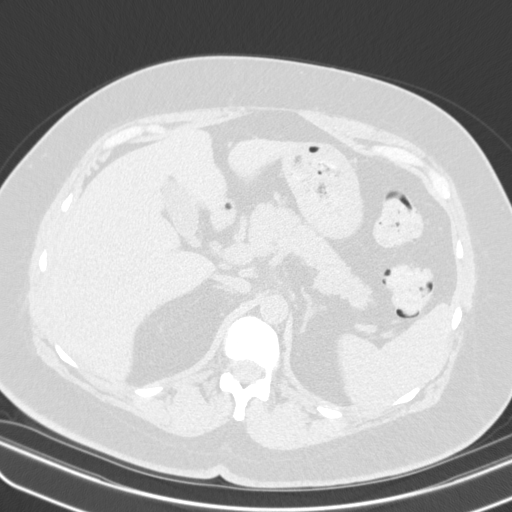
[im 32/157  lung]
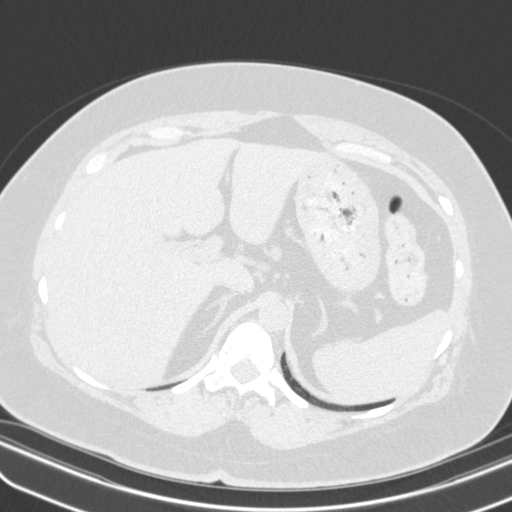
[im 41/157  lung]
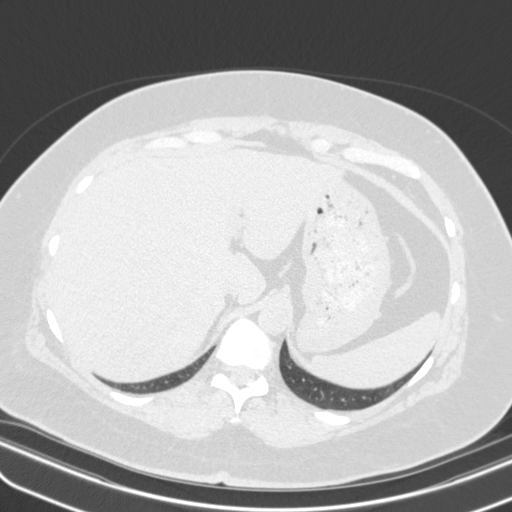
[im 53/157  mediastinal]
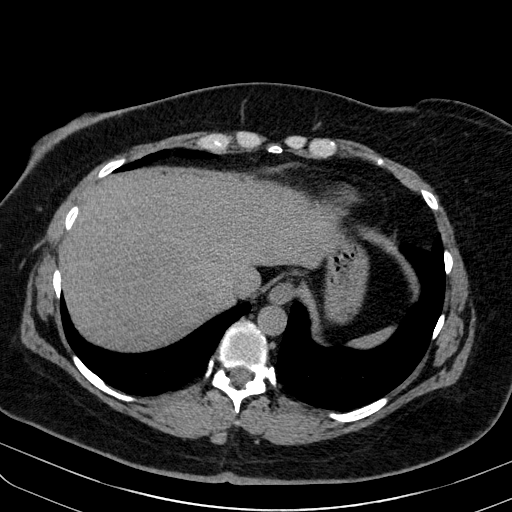
[im 53/157  lung]
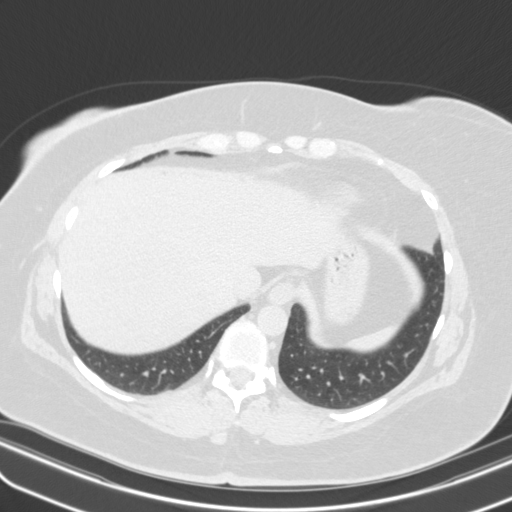
[im 63/157  lung]
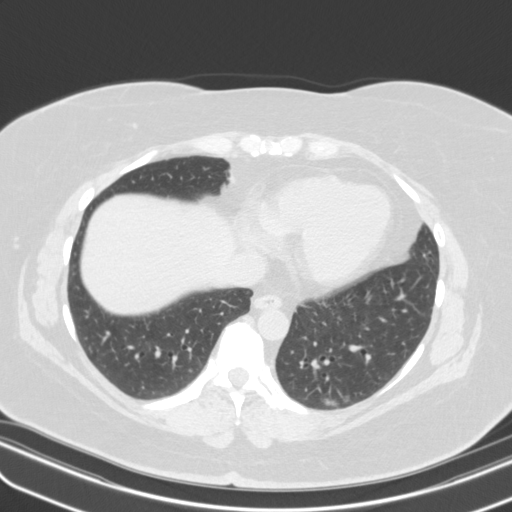
[im 70/157  lung]
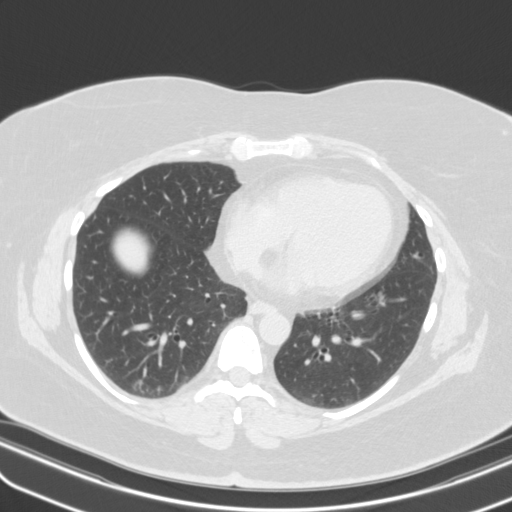
[im 81/157  lung]
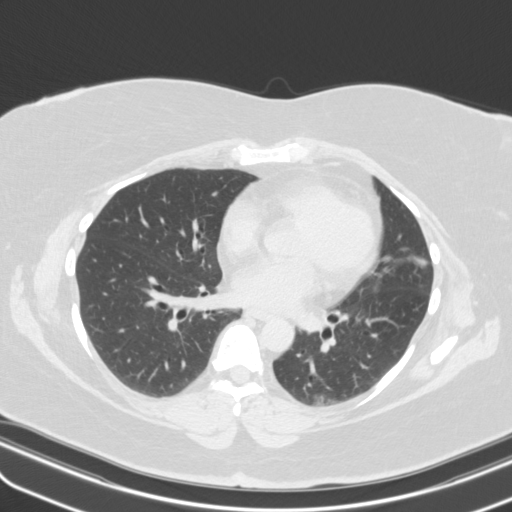
[im 87/157  mediastinal]
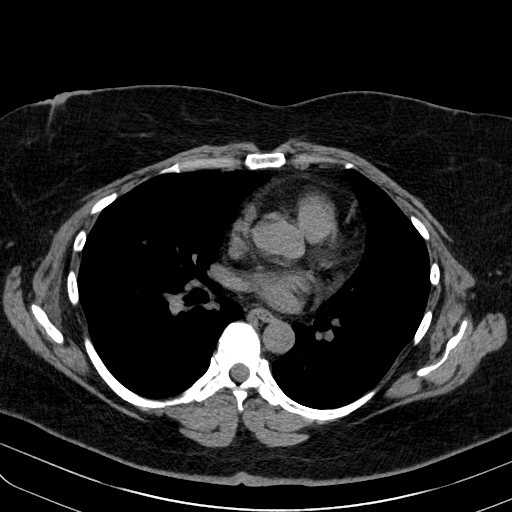
[im 87/157  lung]
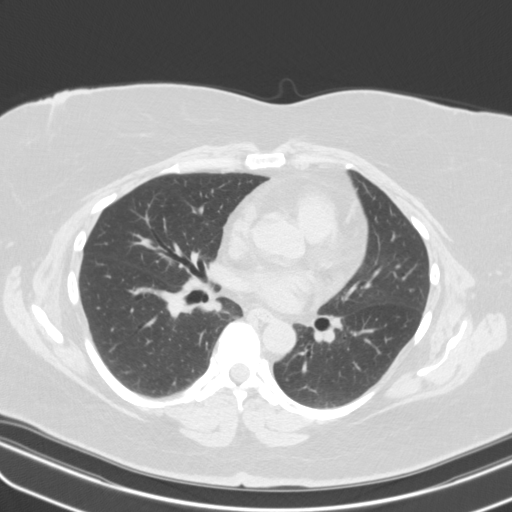
[im 94/157  lung]
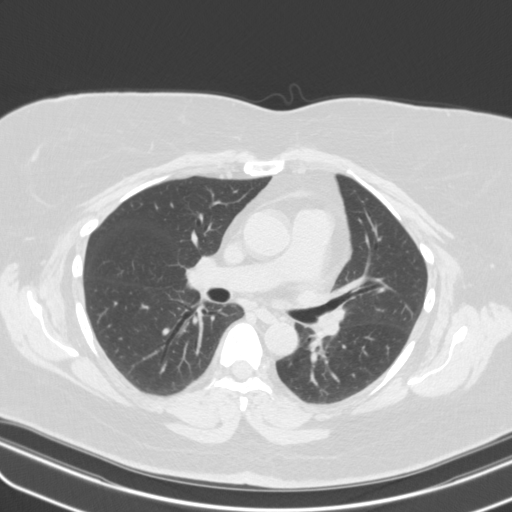
[im 105/157  lung]
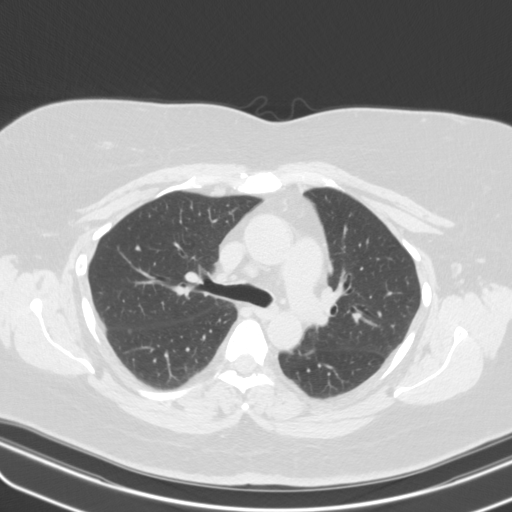
[im 116/157  lung]
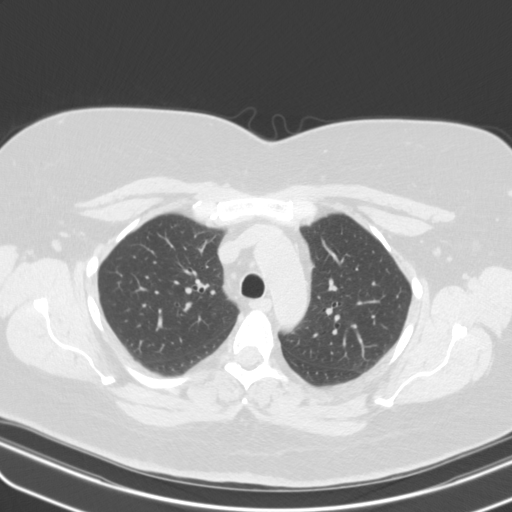
[im 125/157  mediastinal]
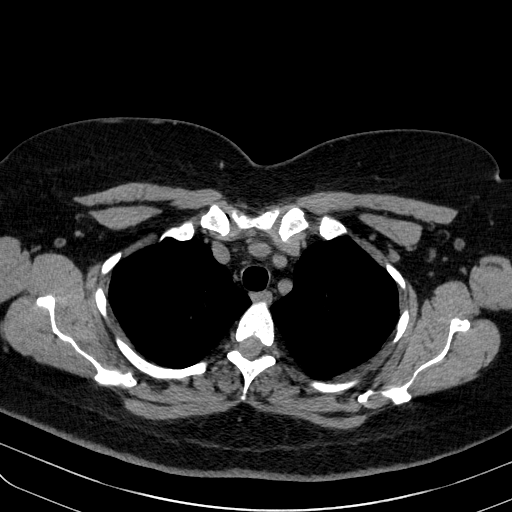
[im 125/157  lung]
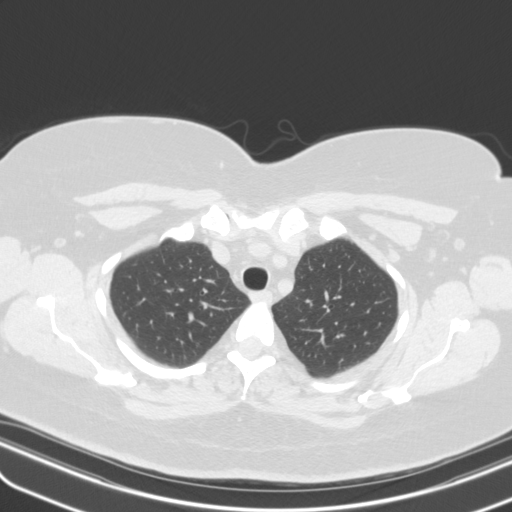
[im 133/157  lung]
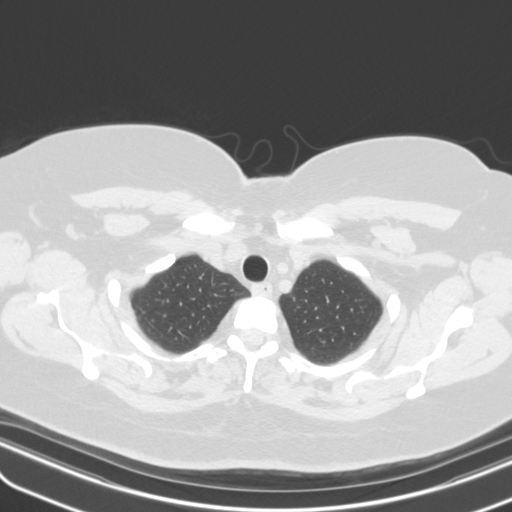
[im 145/157  lung]
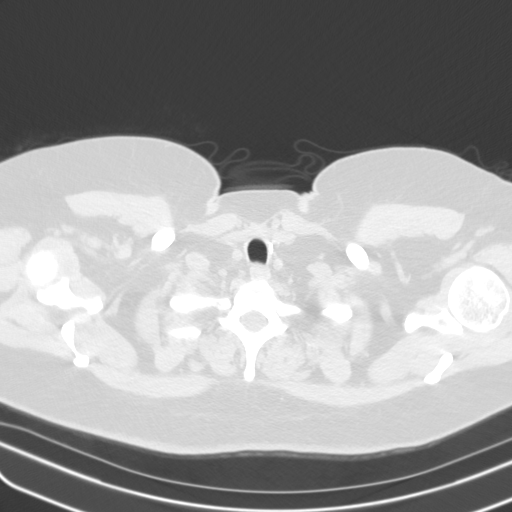

[15 of 34 positions shown; findings below may reference images not displayed]

FINDINGS: Cardiovascular: Heart is normal size. Thoracic aorta is normal
caliber. Common takeoff of the left common carotid artery and right
brachiocephalic artery. Remaining vascular structures are normal.

Mediastinum/Nodes: No significant mediastinal or hilar adenopathy.
Previous thyroidectomy. Remaining mediastinal structures are normal.

Lungs/Pleura: Lungs are adequately inflated evidence of effusion.
There is a well-defined oval nodule over the right upper lobe
measuring 7 mm (5 x 9 mm) correlating to the chest x-ray
abnormality. Minimal atelectasis/scarring over the lingula. Subtle
peripheral patchy hazy opacification over the dependent portion of
the lower lobes left worse than right likely atelectasis, although
early infection is possible. Airways are normal.

Upper Abdomen: Nonobstructing 6 mm stone over the right kidney. No
acute findings.

Musculoskeletal: No focal abnormality.
IMPRESSION: 1. Subtle patchy peripheral hazy opacification over the lower lobes
left worse than right likely atelectasis although early infection or
inflammatory disease is possible. Consider follow-up CT 4-6 weeks.
2. 9 mm well-defined oval nodule over the right upper lobe
correlating to the chest x-ray abnormality. Consider one of the
following in 3 months for both low-risk and high-risk individuals:
(a) repeat chest CT, (b) follow-up PET-CT, or (c) tissue sampling.
This recommendation follows the consensus statement: Guidelines for
Management of Incidental Pulmonary Nodules Detected on CT Images:
3. 6 mm nonobstructing right renal stone.

## 2021-10-19 MED ORDER — NITROFURANTOIN MONOHYD MACRO 100 MG PO CAPS: 100.0000 mg | ORAL_CAPSULE | Freq: Two times a day (BID) | ORAL | 0 refills | Status: DC

## 2021-10-19 NOTE — Addendum Note (Signed)
Addended by: [name] on: 10/19/2021 09:03 AM   Modules accepted: Orders

## 2021-10-29 ENCOUNTER — Telehealth: Payer: Self-pay

## 2021-10-29 ENCOUNTER — Other Ambulatory Visit: Payer: Self-pay | Admitting: Family Medicine

## 2021-10-29 ENCOUNTER — Ambulatory Visit: Payer: Self-pay

## 2021-10-29 NOTE — Telephone Encounter (Signed)
Patient came in to get injection, but because she's more than 6 weeks late, I have to back her down to green. Her red vial is now empty and a new vial of red will need to be made. She is coming back next week to get the injections.

## 2021-11-05 ENCOUNTER — Ambulatory Visit (INDEPENDENT_AMBULATORY_CARE_PROVIDER_SITE_OTHER): Payer: BC Managed Care – PPO

## 2021-11-05 DIAGNOSIS — J309 Allergic rhinitis, unspecified: Secondary | ICD-10-CM

## 2021-11-06 NOTE — Progress Notes (Signed)
[facility] [place] [address] Dept: [phone]  FOLLOW UP NOTE  Patient ID: Jacqueline Holmes, female    DOB: [birth year]  Age: 50 y.o. MRN: [id number] Date of Office Visit: 11/07/2021  Assessment  Chief Complaint: Asthma (About a month ago got sick and was taking Symbicort and inhaler "around the clock"./ACT 19) and Allergic Rhinitis  (Patient states that allergies are ok.)  HPI Jacqueline Holmes is a 50 year old female who presents the clinic for follow-up visit.  She was last seen in this clinic on 03/05/2021 by Gareth Morgan, FNP, for evaluation of asthma, allergic rhinitis, allergic conjunctivitis, and reflux.  In the interim, she reports that in late November she began to develop symptoms including fever, aches, chills, chest congestion, and cough.  She began using Symbicort 160-2 puffs twice a day in place of Qvar 80 and began using albuterol frequently.  She went to her PCP and received Augmentin and prednisone with brief relief of symptoms.  After the return of her symptoms she received prednisone and azithromycin.  She had a chest x-ray on 10/02/2020 that indicated a nodular density followed by a CT scan with evidence of a 9 mm nodule right upper lobe of lung. Shortly after this visit, her husband did test positive for COVID. At today's visit, she reports her asthma has been moderately well controlled over the last month with symptoms including shortness of breath with activity and dry cough occurring intermittently throughout the day.  She denies shortness of breath with rest and wheeze with activity or rest.  She denies nighttime asthma symptoms.  She continues Qvar 80-2 puffs twice a day with a spacer and has used albuterol 1-2 times over the last month with moderate relief of symptoms.  She reports that she has taken montelukast in the past with no adverse reaction.  Allergic rhinitis is reported as moderately well controlled with symptoms including nasal congestion and postnasal drainage  with frequent throat clearing.  She does report that she saw an ENT specialist several years ago and was noted to have nasal polyps at that time.  She denies anosmia and ageusia.  She does report an increase in snoring which is negatively impacting her sleep cycle. She continues Xyzal 5 mg once a day and is not currently using nasal saline rinses or steroid nasal sprays as she reports these increase the swelling and congestion in her nose.  She continues allergen immunotherapy with no large or local reactions.  She reports a significant decrease in her symptoms of allergic rhinitis while continuing on allergen immunotherapy.  Allergic conjunctivitis is reported as well controlled with occasional olopatadine as needed. Her current medications are listed in the chart.    Chest xray 10/02/2020: "Minimal chronic bronchitic changes. Faint 8 mm nodular density RIGHT upper lobe, pulmonary nodule not excluded; CT chest recommended to exclude pulmonary nodule".    Follow-up CT scan on 11/19/20221: "Subtle patchy peripheral hazy opacification over the lower lobes left worse than right likely atelectasis although early infection or inflammatory disease is possible. Consider follow-up CT 4-6 weeks. 2. 9 mm well-defined oval nodule over the right upper lobe correlating to the chest x-ray abnormality. Consider one of the following in 3 months for both low-risk and high-risk individuals: (a) repeat chest CT, (b) follow-up PET-CT, or (c) tissue sampling. This recommendation follows the consensus statement: Guidelines for Management of Incidental Pulmonary Nodules Detected on CT Images: From the Fleischner Society 2017; Radiology 2017; 284:228-243. 3. 6 mm nonobstructing right renal stone".  Drug Allergies:  No Known Allergies  Physical Exam: BP 134/88 (Cuff Size: Large)   Pulse 90   Temp 97.9 F (36.6 C) (Temporal)   Resp 16   Ht 5\' 1"  (1.549 m)   Wt 189 lb 12.8 oz (86.1 kg)   LMP 01/28/2017   SpO2 99%    BMI 35.86 kg/m    Physical Exam Vitals reviewed.  Constitutional:      Appearance: Normal appearance.  HENT:     Head: Normocephalic and atraumatic.     Right Ear: Tympanic membrane normal.     Left Ear: Tympanic membrane normal.     Nose:     Comments: Bilateral nares slightly erythematous with clear nasal drainage noted.  Pharynx normal.  Ears normal.  Eyes normal.    Mouth/Throat:     Pharynx: Oropharynx is clear.  Eyes:     Conjunctiva/sclera: Conjunctivae normal.  Cardiovascular:     Rate and Rhythm: Normal rate and regular rhythm.     Heart sounds: Normal heart sounds. No murmur heard. Pulmonary:     Effort: Pulmonary effort is normal.     Breath sounds: Normal breath sounds.     Comments: Lungs clear to auscultation lungs Musculoskeletal:        General: Normal range of motion.     Cervical back: Normal range of motion and neck supple.  Skin:    General: Skin is warm and dry.  Neurological:     Mental Status: She is alert and oriented to person, place, and time.  Psychiatric:        Mood and Affect: Mood normal.        Behavior: Behavior normal.        Thought Content: Thought content normal.        Judgment: Judgment normal.    Diagnostics: FVC 2.24, FEV1 1.74.  Predicted FVC 3.16, predicted FEV1 2.51.  Spirometry indicates mild restriction.  Postbronchodilator FVC 2.08, FEV1 1.77.  Postbronchodilator spirometry indicates mild restriction with no significant bronchodilator response.  Assessment and Plan: 1. Not well controlled moderate persistent asthma   2. Seasonal and perennial allergic rhinitis   3. Allergic conjunctivitis of both eyes   4. Gastroesophageal reflux disease, unspecified whether esophagitis present   5. Snoring     Meds ordered this encounter  Medications   montelukast (SINGULAIR) 10 MG tablet    Sig: Take 1 tablet (10 mg total) by mouth at bedtime.    Dispense:  30 tablet    Refill:  5     Patient Instructions  Asthma Begin  montelukast 10 mg once a day to prevent cough or wheeze Continue Qvar 80-2 puffs twice a day with a spacer to prevent cough or wheeze Continue albuterol 2 puffs every 4 hours with a spacer as needed for cough or wheeze You may use albuterol 2 puffs 5-15 minutes before activity to decrease cough or wheeze For asthma flares, begin Symbicort 160-2 puffs twice a day with a spacer for 2 weeks or until back to breathing baseline. Stop Qvar while using Symbicort 160  Allergic rhinitis Continue Xyzal 5 mg once a day as needed for a runny nose For thick post nasal drainage, begin Mucinex [phone] mg twice a day Consider saline nasal rinses as needed for nasal symptoms. Use this before any medicated nasal sprays for best result Continue allergen avoidance measures directed toward pollens, dust mite, cat, mold, and cockroach as listed below Continue allergen immunotherapy and have access to an epinephrine auto-injector  set Consider referral to ENT specialist for evaluation of nasal polyposis  Consider Flonase nasal spray 2 sprays in each nostril once a day as needed for stuffy nose.  In the right nostril, point the applicator out toward the right ear. In the left nostril, point the applicator out toward the left ear  Allergic conjunctivitis Continue Pazeo eye drops one drop in each eye once a day as needed for red, itchy eyes. Some over the counter options include Zaditor 1 drop in each eye twice a day as needed for red, itchy eyes OR Opcon-A 2 drops in each eye up to 4 times a day as needed for red, itchy eyes  Snoring Consider referral to ENT specialist for evaluation of snoring which is disrupting sleep  Possible lung nodule Keep your appointment for your follow-up CT scan for evaluation of possible lung nodule  Reflux Continue dietary and lifestyle modifications as listed below Continue omeprazole 40 mg once a day as previously prescribed  Call the clinic if this treatment plan is not working  well for you  Follow up in 2 months or sooner if needed.   Return in about 2 months (around 01/08/2022), or if symptoms worsen or fail to improve.    Thank you for the opportunity to care for this patient.  Please do not hesitate to contact me with questions.  Gareth Morgan, FNP [facility]

## 2021-11-06 NOTE — Patient Instructions (Addendum)
Asthma Begin montelukast 10 mg once a day to prevent cough or wheeze Continue Qvar 80-2 puffs twice a day with a spacer to prevent cough or wheeze Continue albuterol 2 puffs every 4 hours with a spacer as needed for cough or wheeze You may use albuterol 2 puffs 5-15 minutes before activity to decrease cough or wheeze For asthma flares, begin Symbicort 160-2 puffs twice a day with a spacer for 2 weeks or until back to breathing baseline. Stop Qvar while using Symbicort 160  Allergic rhinitis Continue Xyzal 5 mg once a day as needed for a runny nose For thick post nasal drainage, begin Mucinex [phone] mg twice a day Consider saline nasal rinses as needed for nasal symptoms. Use this before any medicated nasal sprays for best result Continue allergen avoidance measures directed toward pollens, dust mite, cat, mold, and cockroach as listed below Continue allergen immunotherapy and have access to an epinephrine auto-injector set Consider referral to ENT specialist for evaluation of nasal polyposis  Consider Flonase nasal spray 2 sprays in each nostril once a day as needed for stuffy nose.  In the right nostril, point the applicator out toward the right ear. In the left nostril, point the applicator out toward the left ear  Allergic conjunctivitis Continue Pazeo eye drops one drop in each eye once a day as needed for red, itchy eyes. Some over the counter options include Zaditor 1 drop in each eye twice a day as needed for red, itchy eyes OR Opcon-A 2 drops in each eye up to 4 times a day as needed for red, itchy eyes  Snoring Consider referral to ENT specialist for evaluation of snoring which is disrupting sleep  Possible lung nodule Keep your appointment for your follow-up CT scan for evaluation of possible lung nodule  Reflux Continue dietary and lifestyle modifications as listed below Continue omeprazole 40 mg once a day as previously prescribed  Call the clinic if this treatment plan is  not working well for you  Follow up in 2 months or sooner if needed.  Reducing Pollen Exposure The American Academy of Allergy, Asthma and Immunology suggests the following steps to reduce your exposure to pollen during allergy seasons. Do not hang sheets or clothing out to dry; pollen may collect on these items. Do not mow lawns or spend time around freshly cut grass; mowing stirs up pollen. Keep windows closed at night.  Keep car windows closed while driving. Minimize morning activities outdoors, a time when pollen counts are usually at their highest. Stay indoors as much as possible when pollen counts or humidity is high and on windy days when pollen tends to remain in the air longer. Use air conditioning when possible.  Many air conditioners have filters that trap the pollen spores. Use a HEPA room air filter to remove pollen form the indoor air you breathe.  Clabe Seal  Control of Dog or Cat Allergen Avoidance is the best way to manage a dog or cat allergy. If you have a dog or cat and are allergic to dog or cats, consider removing the dog or cat from the home. If you have a dog or cat but don't want to find it a new home, or if your family wants a pet even though someone in the household is allergic, here are some strategies that may help keep symptoms at bay:  Keep the pet out of your bedroom and restrict it to only a few rooms. Be advised that keeping the dog or  cat in only one room will not limit the allergens to that room. Don't pet, hug or kiss the dog or cat; if you do, wash your hands with soap and water. High-efficiency particulate air (HEPA) cleaners run continuously in a bedroom or living room can reduce allergen levels over time. Regular use of a high-efficiency vacuum cleaner or a central vacuum can reduce allergen levels. Giving your dog or cat a bath at least once a week can reduce airborne allergen. Control of Mold Allergen Mold and fungi can grow on a variety of surfaces  provided certain temperature and moisture conditions exist.  Outdoor molds grow on plants, decaying vegetation and soil.  The major outdoor mold, Alternaria and Cladosporium, are found in very high numbers during hot and dry conditions.  Generally, a late Summer - Fall peak is seen for common outdoor fungal spores.  Rain will temporarily lower outdoor mold spore count, but counts rise rapidly when the rainy period ends.  The most important indoor molds are Aspergillus and Penicillium.  Dark, humid and poorly ventilated basements are ideal sites for mold growth.  The next most common sites of mold growth are the bathroom and the kitchen.  Outdoor [employer] Use air conditioning and keep windows closed Avoid exposure to decaying vegetation. Avoid leaf raking. Avoid grain handling. Consider wearing a face mask if working in moldy areas.  Indoor Mold Control Maintain humidity below 50%. Clean washable surfaces with 5% bleach solution. Remove sources e.g. Contaminated carpets.  Control of Cockroach Allergen Cockroach allergen has been identified as an important cause of acute attacks of asthma, especially in urban settings.  There are fifty-five species of cockroach that exist in the [place], however only three, the [place], [place] species produce allergen that can affect patients with Asthma.  Allergens can be obtained from fecal particles, egg casings and secretions from cockroaches.    Remove food sources. Reduce access to water. Seal access and entry points. Spray runways with 0.5-1% Diazinon or Chlorpyrifos Blow boric acid power under stoves and refrigerator. Place bait stations (hydramethylnon) at feeding sites.

## 2021-11-07 ENCOUNTER — Encounter: Payer: Self-pay | Admitting: Family Medicine

## 2021-11-07 ENCOUNTER — Other Ambulatory Visit: Payer: Self-pay

## 2021-11-07 ENCOUNTER — Ambulatory Visit: Payer: BC Managed Care – PPO | Admitting: Family Medicine

## 2021-11-07 VITALS — BP 134/88 | HR 90 | Temp 97.9°F | Resp 16 | Ht 61.0 in | Wt 189.8 lb

## 2021-11-07 DIAGNOSIS — R0683 Snoring: Secondary | ICD-10-CM

## 2021-11-07 DIAGNOSIS — K219 Gastro-esophageal reflux disease without esophagitis: Secondary | ICD-10-CM

## 2021-11-07 DIAGNOSIS — J3089 Other allergic rhinitis: Secondary | ICD-10-CM | POA: Diagnosis not present

## 2021-11-07 DIAGNOSIS — J302 Other seasonal allergic rhinitis: Secondary | ICD-10-CM

## 2021-11-07 DIAGNOSIS — H1013 Acute atopic conjunctivitis, bilateral: Secondary | ICD-10-CM | POA: Diagnosis not present

## 2021-11-07 DIAGNOSIS — J4540 Moderate persistent asthma, uncomplicated: Secondary | ICD-10-CM | POA: Diagnosis not present

## 2021-11-07 MED ORDER — MONTELUKAST SODIUM 10 MG PO TABS: 10.0000 mg | ORAL_TABLET | Freq: Every day | ORAL | 5 refills | Status: DC

## 2021-11-13 ENCOUNTER — Ambulatory Visit (INDEPENDENT_AMBULATORY_CARE_PROVIDER_SITE_OTHER): Payer: BC Managed Care – PPO

## 2021-11-13 DIAGNOSIS — J309 Allergic rhinitis, unspecified: Secondary | ICD-10-CM | POA: Diagnosis not present

## 2021-11-17 ENCOUNTER — Other Ambulatory Visit: Payer: Self-pay | Admitting: *Deleted

## 2021-11-17 DIAGNOSIS — R918 Other nonspecific abnormal finding of lung field: Secondary | ICD-10-CM

## 2021-12-02 ENCOUNTER — Other Ambulatory Visit: Payer: Self-pay | Admitting: Family Medicine

## 2021-12-02 ENCOUNTER — Ambulatory Visit (INDEPENDENT_AMBULATORY_CARE_PROVIDER_SITE_OTHER): Payer: BC Managed Care – PPO | Admitting: *Deleted

## 2021-12-02 DIAGNOSIS — J309 Allergic rhinitis, unspecified: Secondary | ICD-10-CM

## 2021-12-04 ENCOUNTER — Ambulatory Visit: Payer: BC Managed Care – PPO | Admitting: Family Medicine

## 2021-12-04 ENCOUNTER — Other Ambulatory Visit: Payer: Self-pay

## 2021-12-04 ENCOUNTER — Encounter: Payer: Self-pay | Admitting: Family Medicine

## 2021-12-04 VITALS — BP 138/84 | HR 80 | Temp 98.4°F | Ht 61.0 in | Wt 188.4 lb

## 2021-12-04 DIAGNOSIS — N644 Mastodynia: Secondary | ICD-10-CM | POA: Diagnosis not present

## 2021-12-04 NOTE — Progress Notes (Signed)
Chief Complaint  Patient presents with   Breast Problem    Left nipple pain x 1 week. Had breast reduction about 3 years ago. Had some issues with nipple itching in the past. No drainage.    L nipple started hurting about a week ago.  No known irritation, change in clothing/bra. Denies drainage, discharge or redness. No breast tenderness otherwise.  No lumps or other concerns.  Last mammo was 08/2021 at [facility], normal. S/p breast reduction surgery in 10/2018  On HRT, no changes to doses recently. A couple of weeks ago she noted slight spotting with wiping, nothing since. Denies any spotting in the underwear.   PMH, PSH, SH reviewed  Outpatient Encounter Medications as of 12/04/2021  Medication Sig Note   acetaminophen (TYLENOL) 500 MG tablet Take 1,000 mg by mouth every 6 (six) hours as needed for headache. Reported on 06/17/2016 12/04/2021: Took yesterday afternoon for HA   beclomethasone (QVAR REDIHALER) 80 MCG/ACT inhaler Inhale 2 puffs into the lungs 2 (two) times daily.    estradiol (ESTRACE) 0.5 MG tablet TAKE 1 TABLET ONCE DAILY.    Fexofenadine HCl (ALLEGRA PO) Take by mouth.    levocetirizine (XYZAL) 5 MG tablet TAKE ONE TABLET BY MOUTH EVERY EVENING **NEED OFFICE VISIT**    montelukast (SINGULAIR) 10 MG tablet Take 1 tablet (10 mg total) by mouth at bedtime.    omeprazole (PRILOSEC) 40 MG capsule TAKE ONE CAPSULE BY MOUTH TWICE DAILY.    progesterone (PROMETRIUM) 100 MG capsule TAKE 1 CAPSULE AT BEDTIME.    Spacer/Aero-Holding Chambers [facility] DIAMOND) DEVI Use as directed with inhaler. Dx:  J45.40 Asthma (Patient taking differently: Use as directed with inhaler. Dx:  J45.40 Asthma) 12/04/2021: Uses with Symbicort   thyroid (NP THYROID) 90 MG tablet Take 1 tablet (90 mg total) by mouth in the morning and at bedtime.    albuterol (VENTOLIN HFA) 108 (90 Base) MCG/ACT inhaler Inhale 2 puffs into the lungs every 4 (four) hours as needed for wheezing or shortness of breath. (Patient  not taking: Reported on 12/04/2021) 12/04/2021: Uses as needed   guaiFENesin (MUCINEX) 600 MG 12 hr tablet Take 600 mg by mouth 2 (two) times daily. (Patient not taking: Reported on 10/13/2021) 12/04/2021: Only as needed   SYMBICORT 160-4.5 MCG/ACT inhaler Two puffs with spacer device twice a day during asthma flares. (Patient not taking: Reported on 12/04/2021) 12/04/2021: Only as needed   [DISCONTINUED] metFORMIN (GLUCOPHAGE-XR) 500 MG 24 hr tablet Take 2 tablets (1,000 mg total) by mouth daily with breakfast.    No facility-administered encounter medications on file as of 12/04/2021.   No Known Allergies  ROS: no fever, chills, URI symptoms, headache. No rashes. Nipple pain per HPI.   PHYSICAL EXAM:  BP 138/84    Pulse 80    Temp 98.4 F (36.9 C) (Tympanic)    Ht 5\' 1"  (1.549 m)    Wt 188 lb 6.4 oz (85.5 kg)    LMP 01/28/2017    BMI 35.60 kg/m   Pleasant, well-appearing female in no distress  L breast is larger than the right. WHSS around both areola Sensitive to touch at the superior portion of the L nipple (areola) No erythema, swelling or other visible abnormality. No breast masses, tenderness or axillary lymphadenopathy.   ASSESSMENT/PLAN:  Nipple pain - left.  Normal exam.  Check diagnostic mammo/US. If normal, may need to adjust her HRT. Sensitive to LT (superficial), not with deep palpation  L diagnostic mammogram and [place] for evaluation of  L nipple pain--[facility]. If normal, will send to her GYN (Dr. Quincy Simmonds) to see if it would be appropriate to try changes to her HRT.

## 2021-12-04 NOTE — Patient Instructions (Signed)
We will set you up for a diagnostic mammogram through [facility]. If normal, you should touch base with Dr. Quincy Simmonds regarding your hormones.

## 2021-12-17 ENCOUNTER — Ambulatory Visit (INDEPENDENT_AMBULATORY_CARE_PROVIDER_SITE_OTHER): Payer: BC Managed Care – PPO

## 2021-12-17 DIAGNOSIS — J309 Allergic rhinitis, unspecified: Secondary | ICD-10-CM

## 2021-12-23 ENCOUNTER — Other Ambulatory Visit: Payer: Self-pay

## 2021-12-23 DIAGNOSIS — J45901 Unspecified asthma with (acute) exacerbation: Secondary | ICD-10-CM

## 2021-12-23 MED ORDER — QVAR REDIHALER 80 MCG/ACT IN AERB: 2.0000 | INHALATION_SPRAY | Freq: Two times a day (BID) | RESPIRATORY_TRACT | 0 refills | Status: DC

## 2021-12-24 DIAGNOSIS — J301 Allergic rhinitis due to pollen: Secondary | ICD-10-CM

## 2021-12-24 NOTE — Progress Notes (Signed)
VIALS EXP 12-24-22

## 2021-12-25 ENCOUNTER — Ambulatory Visit (INDEPENDENT_AMBULATORY_CARE_PROVIDER_SITE_OTHER): Payer: BC Managed Care – PPO

## 2021-12-25 DIAGNOSIS — J309 Allergic rhinitis, unspecified: Secondary | ICD-10-CM

## 2021-12-26 DIAGNOSIS — J302 Other seasonal allergic rhinitis: Secondary | ICD-10-CM

## 2021-12-31 NOTE — Progress Notes (Signed)
51 y.o. G2P1 Married Caucasian female here for annual exam.    Patient complaining of left breast pain, which began in her nipple. Had recent diagnostic mammogram--see report.   Benign cyst noted.  Pain started just after Christmas.  Has dull and shooting pain.    No trauma.   She is on her HRT for about one year.  No hot flashes.   Bladder control is getting worse.  Voiding constantly and having a continuous drip.  Leaks with laugh and exercise.  Rare random leakage.  DF every 1 - 2 hours.  NF up 2 - 3 times per night.  Interrupts her sleep.  She feels like she voids well.  No medication for overactive bladder in past. No pelvic floor therapy.  No constipation or accidental leakage of stool.  Expecting her first grandchild soon. Working on her dissertation.  PCP:   [name], MD  Patient's last menstrual period was 01/28/2017.           Sexually active: Yes.    The current method of family planning is post menopausal status.    Exercising: No.  The patient does not participate in regular exercise at present. Smoker:  no  Health Maintenance: Pap:  01-22-20 Neg:Neg HR HPV, 09-02-18 Neg:Neg HR HPV, 05/2017 normal per patient History of abnormal Pap:  Yes, ~2005 patient thinks she had a biopsy. MMG:  12-17-21 Rt.Br.neg;Lt.Br.benign oval cyst/Neg/BiRads2/screening 1year Colonoscopy:  12-24-20 polyps removed;next 7 years BMD:   n/a  Result  n/a TDaP:  04/2018 Gardasil:   no HIV: never Hep C: 01-27-18 Neg Screening Labs:  PCP Flu vaccine:  completed.    reports that she has never smoked. She has never used smokeless tobacco. She reports current alcohol use. She reports that she does not use drugs.  Past Medical History:  Diagnosis Date   Abnormal uterine bleeding (AUB)    march/april 2021   Allergy    Angio-edema    Asthma    Cancer ([facility])    skin cancer removed    Cough    non prod   Diabetes mellitus arising in pregnancy    now pre-diabetes   Elevated BP without  diagnosis of hypertension    high with DOCTOR  visits , then goes back to normal    Family history of breast cancer    MGM   Family history of pancreatic cancer    Family history of prostate cancer    Fatigue    Generalized headaches    migraines on occasion.   GERD (gastroesophageal reflux disease)    Graves disease 03/2010   Heart murmur    History of COVID-19 06/26/2020   Hyperlipidemia    Hypothyroidism    s/p thyroidectomy for Grave's disease (Dr. Tye Savoy at [facility])   IBS (irritable bowel syndrome)    Incontinence    Infertility, female    Migraine    without aura   Plantar fasciitis, bilateral 2021   Pre-diabetes    Recurrent upper respiratory infection (URI)    last uri dec 2020   Sinus problem    runny nose   Sleep apnea    mild no cpap needed    Uterine polyp     Past Surgical History:  Procedure Laterality Date   ABDOMINOPLASTY  12/2015   Dr. Towanda Malkin   BREAST REDUCTION SURGERY  11/03/2018   BREAST SURGERY  10/2018   breast reduction   CESAREAN SECTION     x 1  DILATATION & CURETTAGE/HYSTEROSCOPY WITH TRUECLEAR N/A 01/16/2014   Procedure: DILATATION & CURETTAGE/HYSTEROSCOPY WITH TRUCLEAR;  Surgeon: Marylynn Pearson, MD;  Location: [facility];  Service: Gynecology;  Laterality: N/A;   LASIK     prk laser eye surgery Left 08/2019   ROBOTIC ASSISTED SALPINGO OOPHERECTOMY Bilateral 03/28/2020   Procedure: XI ROBOTIC ASSISTED SALPINGO OOPHORECTOMY;;  Surgeon: Lafonda Mosses, MD;  Location: [facility];  Service: Gynecology;  Laterality: Bilateral;   skin cancer remove  06/2020   TOTAL THYROIDECTOMY  07/30/11   Dr. Harlow Asa (Grave's disease)   [place] [facility] Milagros Loll BIOPSY N/A 01/16/2014   Procedure: VULVAR BIOPSY;  Surgeon: Marylynn Pearson, MD;  Location: [facility];  Service: Gynecology;  Laterality: N/A;    Current Outpatient Medications  Medication Sig Dispense Refill   acetaminophen (TYLENOL) 500 MG tablet Take 1,000 mg by mouth every 6  (six) hours as needed for headache. Reported on 06/17/2016     albuterol (VENTOLIN HFA) 108 (90 Base) MCG/ACT inhaler Inhale 2 puffs into the lungs every 4 (four) hours as needed for wheezing or shortness of breath. 8 g 0   beclomethasone (QVAR REDIHALER) 80 MCG/ACT inhaler Inhale 2 puffs into the lungs 2 (two) times daily. 10.6 g 0   estradiol (ESTRACE) 0.5 MG tablet TAKE 1 TABLET ONCE DAILY. 90 tablet 0   Fexofenadine HCl (ALLEGRA PO) Take by mouth.     guaiFENesin (MUCINEX) 600 MG 12 hr tablet Take 600 mg by mouth 2 (two) times daily.     levocetirizine (XYZAL) 5 MG tablet TAKE ONE TABLET BY MOUTH EVERY EVENING **NEED OFFICE VISIT** 30 tablet 5   montelukast (SINGULAIR) 10 MG tablet Take 1 tablet (10 mg total) by mouth at bedtime. 30 tablet 5   omeprazole (PRILOSEC) 40 MG capsule TAKE ONE CAPSULE BY MOUTH TWICE DAILY. 60 capsule 2   progesterone (PROMETRIUM) 100 MG capsule TAKE 1 CAPSULE AT BEDTIME. 90 capsule 0   Spacer/Aero-Holding Chambers (OPTICHAMBER DIAMOND) DEVI Use as directed with inhaler. Dx:  J45.40 Asthma (Patient taking differently: Use as directed with inhaler. Dx:  J45.40 Asthma) 1 Device 2   SYMBICORT 160-4.5 MCG/ACT inhaler Two puffs with spacer device twice a day during asthma flares. 10.2 g 5   thyroid (NP THYROID) 90 MG tablet Take 1 tablet (90 mg total) by mouth in the morning and at bedtime. 60 tablet 1   No current facility-administered medications for this visit.    Family History  Problem Relation Age of Onset   Prostate cancer Father        dx. in his 59s   Allergic rhinitis Mother    Asthma Mother    Alpha-1 antitrypsin deficiency Mother    COPD Mother    Diabetes Maternal Grandmother    Pancreatic cancer Maternal Grandmother        dx. in her early 8s   Heart disease Maternal Grandfather        CABG in 60's   Diabetes Maternal Grandfather    Colon polyps Maternal Grandfather    Diabetes Paternal Grandmother    Diabetes Paternal Grandfather    Heart  disease Paternal Grandfather    Prostate cancer Paternal Uncle        dx. in his 50s   Angioedema Neg Hx    Eczema Neg Hx    Colon cancer Neg Hx    Esophageal cancer Neg Hx    Rectal cancer Neg Hx    Stomach cancer Neg Hx     Review of  Systems  See HPI.  Exam:   BP (!) 150/86    Pulse (!) 117    Ht 5\' 2"  (1.575 m)    Wt 188 lb (85.3 kg)    LMP 01/28/2017    SpO2 97%    BMI 34.39 kg/m     General appearance: alert, cooperative and appears stated age Head: normocephalic, without obvious abnormality, atraumatic Neck: no adenopathy, supple, symmetrical, trachea midline and thyroid normal to inspection and palpation Lungs: clear to auscultation bilaterally Breasts: normal appearance, no masses or tenderness, No nipple retraction or dimpling, No nipple discharge or bleeding, No axillary adenopathy Heart: regular rate and rhythm Abdomen: soft, non-tender; no masses, no organomegaly Extremities: extremities normal, atraumatic, no cyanosis or edema Skin: skin color, texture, turgor normal. No rashes or lesions Lymph nodes: cervical, supraclavicular, and axillary nodes normal. Neurologic: grossly normal  Pelvic: External genitalia:  no lesions              No abnormal inguinal nodes palpated.              Urethra:  normal appearing urethra with no masses, tenderness or lesions              Bartholins and Skenes: normal                 Vagina: normal appearing vagina with normal color and discharge, no lesions              Cervix: no lesions              Pap taken: no Bimanual Exam:  Uterus:  normal size, contour, position, consistency, mobility, non-tender              Adnexa: no mass, fullness, tenderness              Rectal exam: yes.  Confirms.              Anus:  normal sphincter tone, no lesions  Chaperone was present for exam:  Estill Bamberg, CMA  Assessment:   Well woman visit with gynecologic exam. Status post robotic assisted BSO.  Benign serous cystadenofibromas. Left breast  pain. Benign cyst on imaging.  Status post bilateral breast reduction.  HRT.  Fibroids.  FH of prostate and pancreatic cancers.  Negative genetic testing.  Mixed incontinence.   Plan: Mammogram screening discussed. Self breast awareness reviewed. Will order breast MRI. Pap and HR HPV as above. Guidelines for Calcium, Vitamin D, regular exercise program including cardiovascular and weight bearing exercise. Referral for pelvic floor therapy.  Wean off HRT over the next 2 months.  We discussed benefits and risks of HRT.  Discused WHI and use of HRT which can increase risk of PE, DVT, MI, stroke and breast cancer.   Follow up annually and prn.   After visit summary provided.   20 min time was spent for this patient encounter, including preparation, face-to-face counseling with the patient, coordination of care, and documentation of the encounter for evaluation and care plan of her left breast pain and mixed incontinence.

## 2022-01-01 ENCOUNTER — Encounter: Payer: Self-pay | Admitting: Obstetrics and Gynecology

## 2022-01-01 ENCOUNTER — Telehealth: Payer: Self-pay | Admitting: Obstetrics and Gynecology

## 2022-01-01 ENCOUNTER — Other Ambulatory Visit: Payer: Self-pay

## 2022-01-01 ENCOUNTER — Ambulatory Visit (INDEPENDENT_AMBULATORY_CARE_PROVIDER_SITE_OTHER): Payer: BC Managed Care – PPO | Admitting: Obstetrics and Gynecology

## 2022-01-01 VITALS — BP 150/86 | HR 117 | Ht 62.0 in | Wt 188.0 lb

## 2022-01-01 DIAGNOSIS — Z79890 Hormone replacement therapy (postmenopausal): Secondary | ICD-10-CM | POA: Diagnosis not present

## 2022-01-01 DIAGNOSIS — Z01419 Encounter for gynecological examination (general) (routine) without abnormal findings: Secondary | ICD-10-CM

## 2022-01-01 DIAGNOSIS — N3946 Mixed incontinence: Secondary | ICD-10-CM

## 2022-01-01 DIAGNOSIS — N644 Mastodynia: Secondary | ICD-10-CM

## 2022-01-01 MED ORDER — PROGESTERONE MICRONIZED 100 MG PO CAPS: 100.0000 mg | ORAL_CAPSULE | Freq: Every day | ORAL | 1 refills | Status: DC

## 2022-01-01 MED ORDER — ESTRADIOL 0.5 MG PO TABS: ORAL_TABLET | ORAL | 0 refills | Status: DC

## 2022-01-01 NOTE — Telephone Encounter (Signed)
Patient also needs a referral for pelvic floor therapy with a physical therapist at [facility].  This is not a referral to see the urologist.   Patient has mixed incontinence.

## 2022-01-01 NOTE — Patient Instructions (Signed)

## 2022-01-01 NOTE — Telephone Encounter (Signed)
Please schedule a breast MRI with contrast for evaluation of left breast pain.

## 2022-01-01 NOTE — Telephone Encounter (Signed)
Referral Faxed to [facility]. I spoke with patient and informed her I faxed it over. SHe can wait for that office to call her or I provided her the phone number and she can call to schedule.  Breast MRI order placed. I spoke with patient and informed her it has been placed and she can call to schedule the appt as there any screening questions for the patient. Again, phone # provided.

## 2022-01-02 ENCOUNTER — Ambulatory Visit
Admission: RE | Admit: 2022-01-02 | Discharge: 2022-01-02 | Disposition: A | Discharge status: Home / Self Care | Payer: BC Managed Care – PPO | Source: Ambulatory Visit | Attending: Family Medicine | Admitting: Family Medicine

## 2022-01-02 ENCOUNTER — Ambulatory Visit (INDEPENDENT_AMBULATORY_CARE_PROVIDER_SITE_OTHER): Payer: BC Managed Care – PPO | Admitting: *Deleted

## 2022-01-02 DIAGNOSIS — J309 Allergic rhinitis, unspecified: Secondary | ICD-10-CM

## 2022-01-02 DIAGNOSIS — R918 Other nonspecific abnormal finding of lung field: Secondary | ICD-10-CM

## 2022-01-02 IMAGING — CT CT CHEST W/O CM
1 series · 15 of 34 positions shown, 19 images · non-contrast
Comparison: [DATE]

CLINICAL DATA: Abnormal xray - lung nodule, < 1 cm, low risk f/u
lung nodule



[Series 2: chest w/(date) · axial · 0.80mm/px · z∈[+770,+1032]mm · 15 of 155 slices shown, 19 images]
[im 12/155  mediastinal]
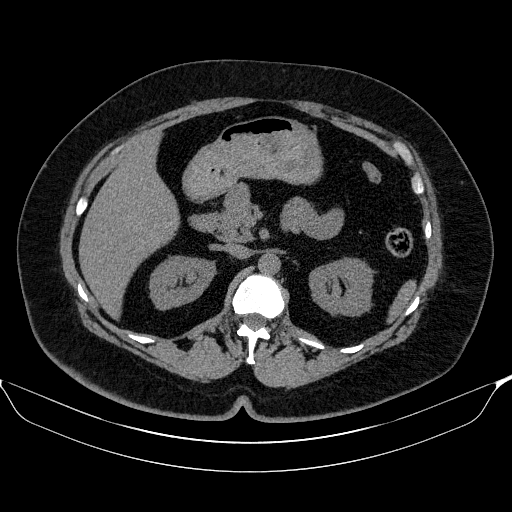
[im 12/155  lung]
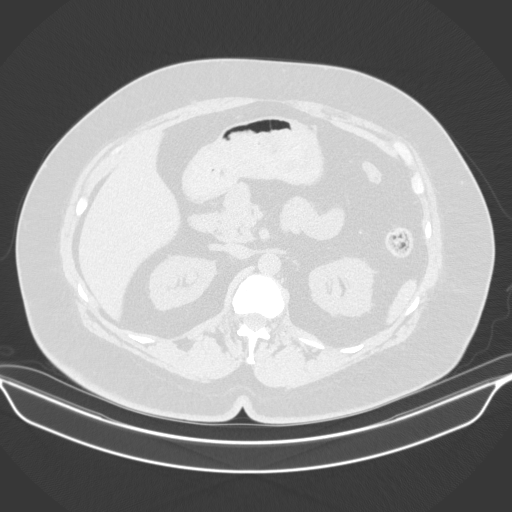
[im 23/155  lung]
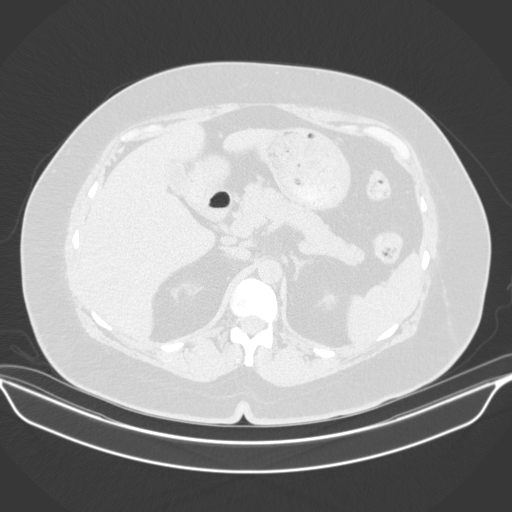
[im 31/155  lung]
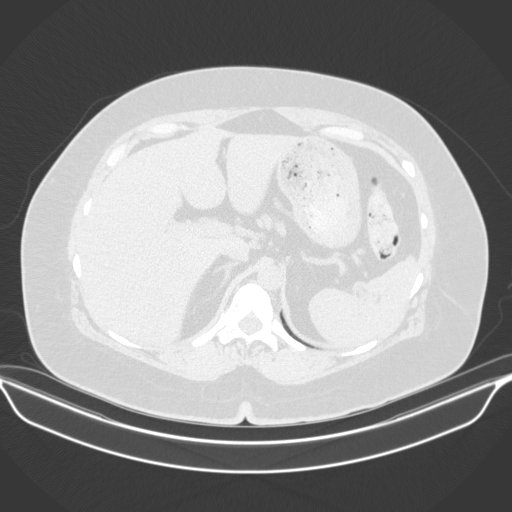
[im 40/155  lung]
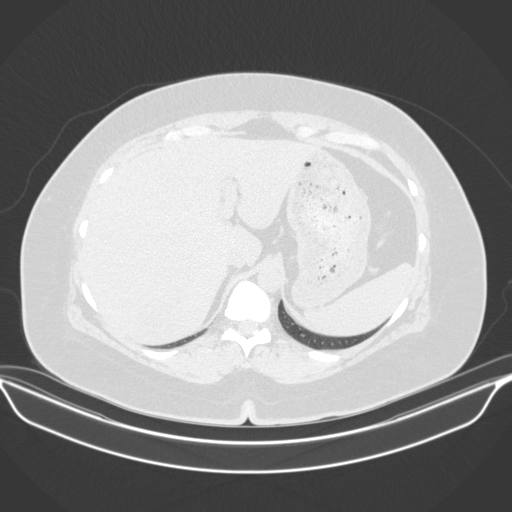
[im 52/155  mediastinal]
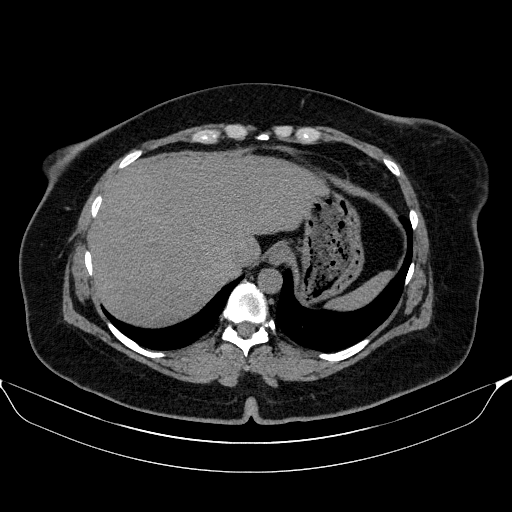
[im 52/155  lung]
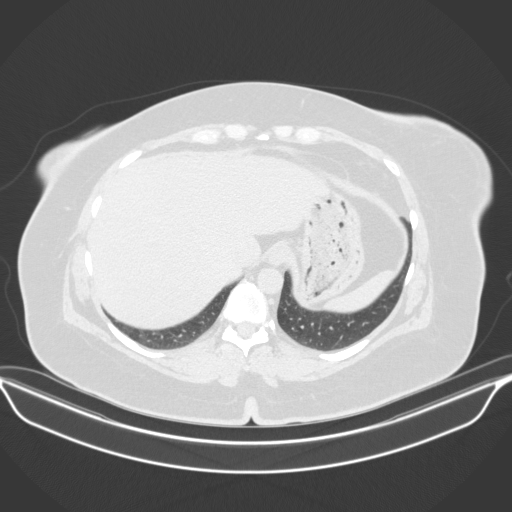
[im 62/155  lung]
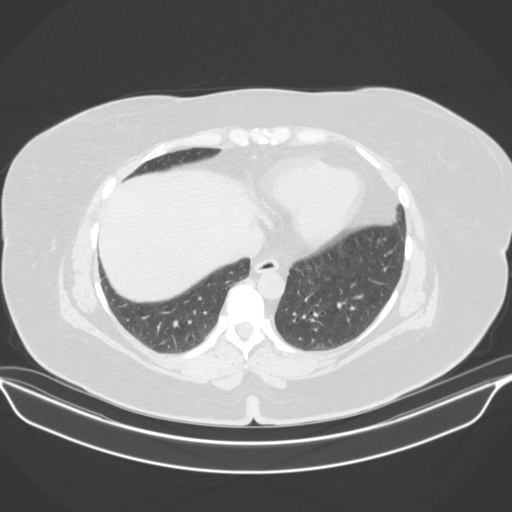
[im 69/155  lung]
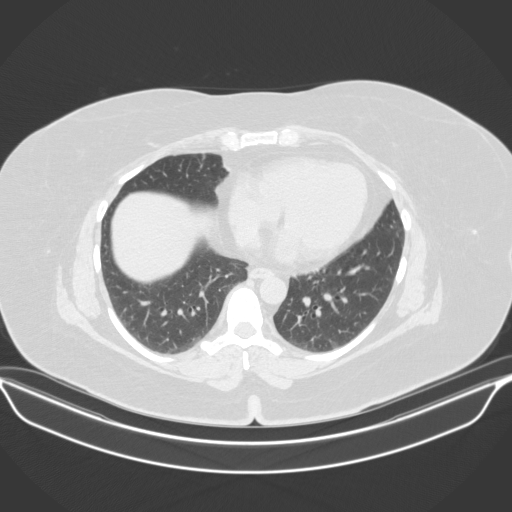
[im 80/155  lung]
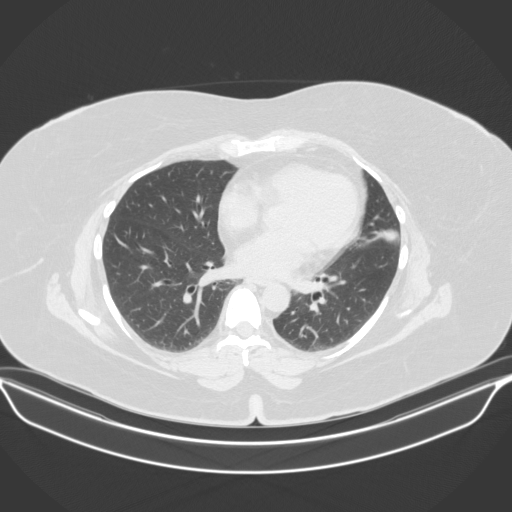
[im 86/155  mediastinal]
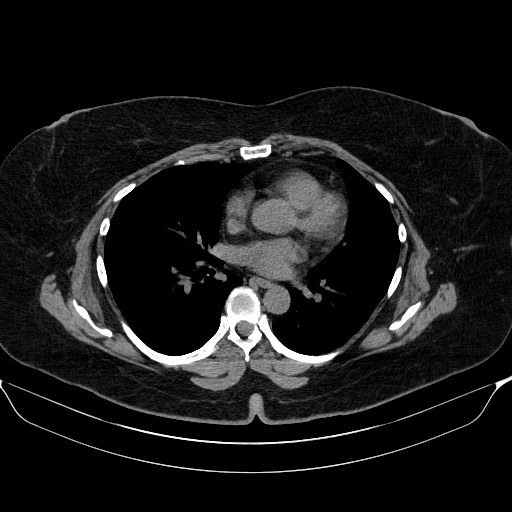
[im 86/155  lung]
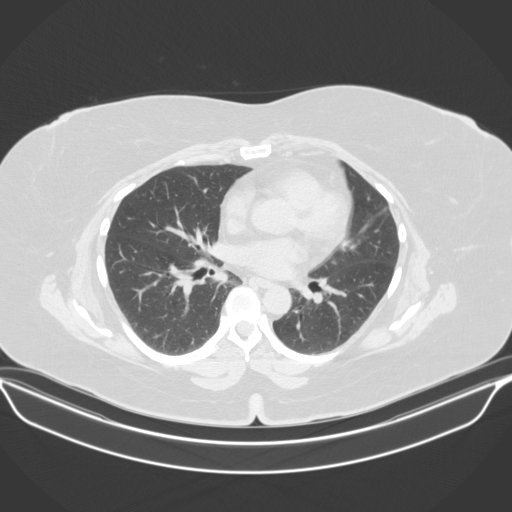
[im 93/155  lung]
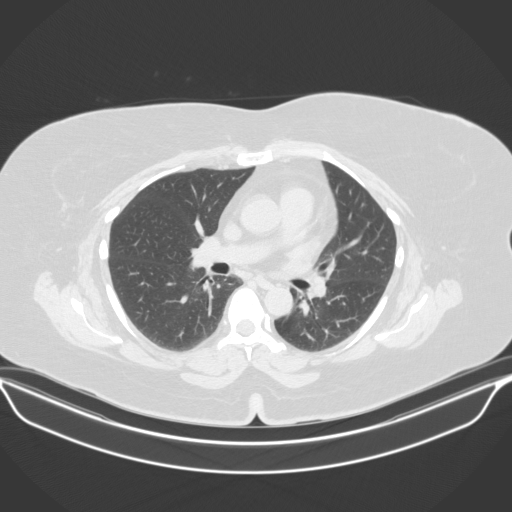
[im 103/155  lung]
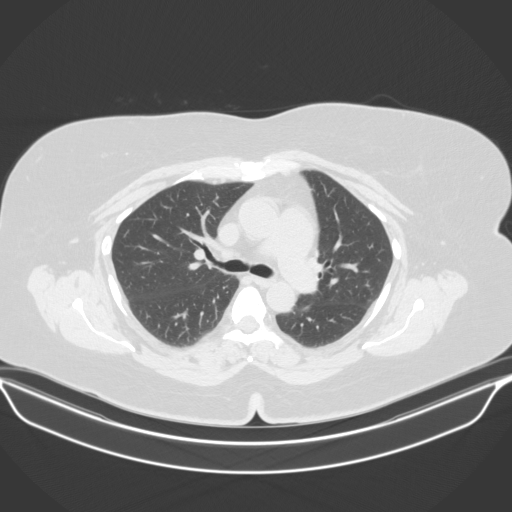
[im 115/155  lung]
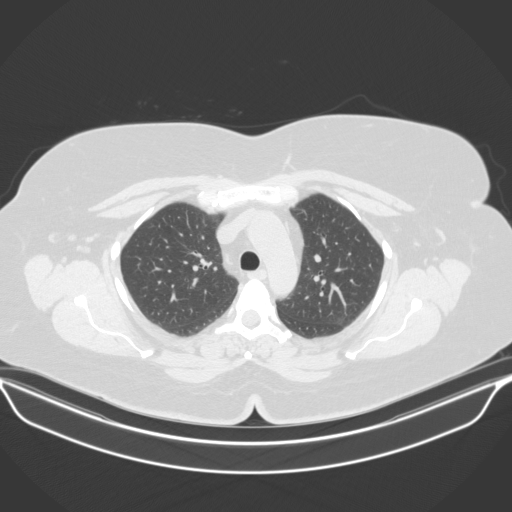
[im 124/155  mediastinal]
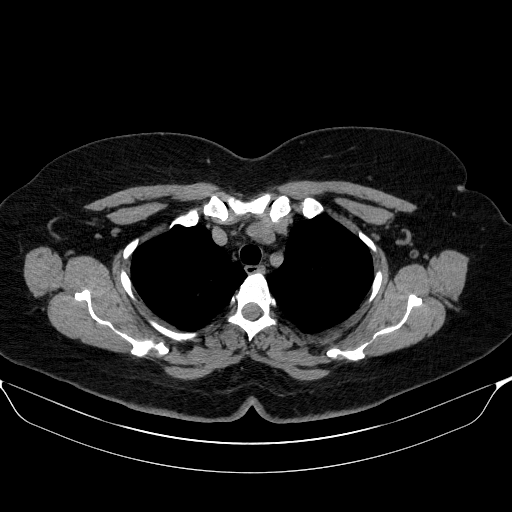
[im 124/155  lung]
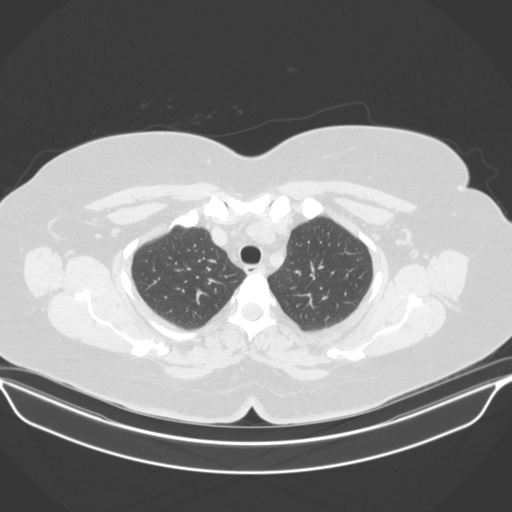
[im 132/155  lung]
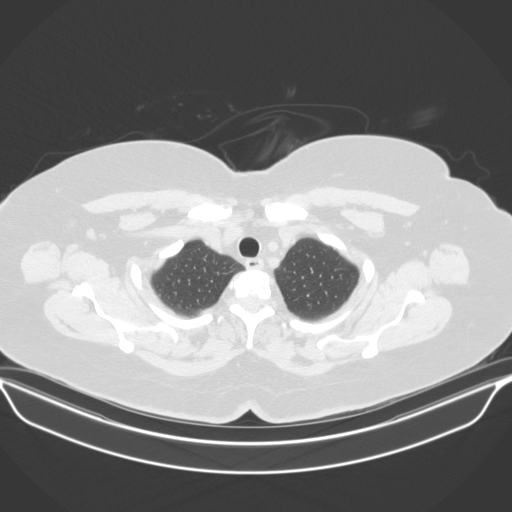
[im 143/155  lung]
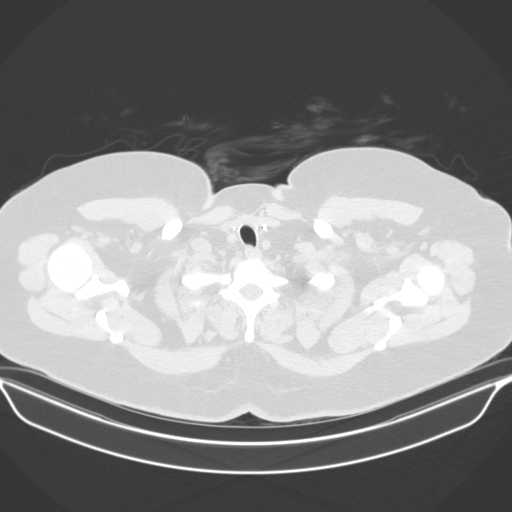

[15 of 34 positions shown; findings below may reference images not displayed]

FINDINGS: Cardiovascular: Heart is normal size. Aorta is normal caliber.

Mediastinum/Nodes: No mediastinal, hilar, or axillary adenopathy.
Trachea and esophagus are unremarkable. Prior thyroidectomy.

Lungs/Pleura: Right upper lobe pulmonary nodule again noted
measuring up to 9 mm in longest diameter, stable since prior study.
No new or enlarging pulmonary nodules. Linear scarring in the
lingula. No confluent opacities or effusions.

Upper Abdomen: Imaging into the upper abdomen demonstrates no acute
findings.

Musculoskeletal: Chest wall soft tissues are unremarkable. No acute
bony abnormality.
IMPRESSION: Stable 9 mm right upper lobe pulmonary nodule. Recommend continued
follow-up with repeat CT in 6-12 months.

## 2022-01-05 NOTE — Telephone Encounter (Signed)
MRI scheduled for 01/16/22.  Routed to [facility] to inquire if PA needed.

## 2022-01-06 ENCOUNTER — Ambulatory Visit (INDEPENDENT_AMBULATORY_CARE_PROVIDER_SITE_OTHER): Payer: BC Managed Care – PPO | Admitting: *Deleted

## 2022-01-06 DIAGNOSIS — J309 Allergic rhinitis, unspecified: Secondary | ICD-10-CM

## 2022-01-13 NOTE — Telephone Encounter (Signed)
Prior Authorization was denied. Appeal was filed with additional clinical records. Appeal was denied based on mammogram and ultrasound not showing indication for additional imaging.

## 2022-01-13 NOTE — Telephone Encounter (Signed)
Please contact patient and inform her of Dr. Elza Rafter message below.  Nunzio Cobbs, MD  Carlos Heber, [facility] The patient needs to be informed that the MRI was denied based on the mammogram and ultrasound findings.   I recommend a follow up with me if her symptoms continue.   Josefa Half, MD

## 2022-01-13 NOTE — Telephone Encounter (Signed)
Did Dr. Quincy Simmonds do peer to peer review or when you called she did not meet criteria?  Would like more info in case patient has questions.

## 2022-01-13 NOTE — Telephone Encounter (Signed)
Spoke with patient and informed her. She wants to talk with [facility] about cost to pay out of pocket. She does not want me to cancel MRI until she does this. She said she will call back and let me know what she decides to do.

## 2022-01-16 ENCOUNTER — Other Ambulatory Visit: Payer: Self-pay

## 2022-01-16 ENCOUNTER — Ambulatory Visit
Admission: RE | Admit: 2022-01-16 | Discharge: 2022-01-16 | Disposition: A | Discharge status: Home / Self Care | Payer: BC Managed Care – PPO | Source: Ambulatory Visit | Attending: Obstetrics and Gynecology | Admitting: Obstetrics and Gynecology

## 2022-01-16 DIAGNOSIS — N644 Mastodynia: Secondary | ICD-10-CM

## 2022-01-16 IMAGING — MR MR BREAST BILAT WO/W CM
8 of 12 series · 30 of 48 positions shown · IV contrast (9ml gadavist)
Comparison: No prior MRI. Prior mammograms and breast ultrasounds
recently [DATE].
COMPARISON: No prior MRI. Prior mammograms and breast ultrasounds
recently [DATE].

Addendum:
CLINICAL DATA: 50-year-old with a personal history of reduction
mammoplasty in [DATE], presenting with LEFT breast pain and
heaviness over the past 2 months.

EXAM:
BILATERAL BREAST MRI WITH AND WITHOUT CONTRAST
TECHNIQUE: Multiplanar, multisequence MR images of both breasts were obtained
prior to and following the intravenous administration of 8 ml of
Gadavist.

[Series 2: t2_tirm_tra ipat (a-p) · axial · 3.0mm · 0.78mm/px · 1 of 57 slices shown]
[im 1/57]
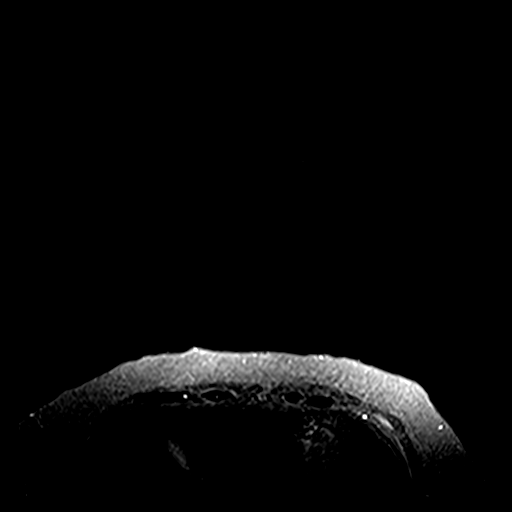

[Series 3: fl3d pre-cm no · axial · non-contrast · 1.2mm · 1.04mm/px · z∈[-78,+93]mm · 5 of 144 slices shown]
[im 1/144]
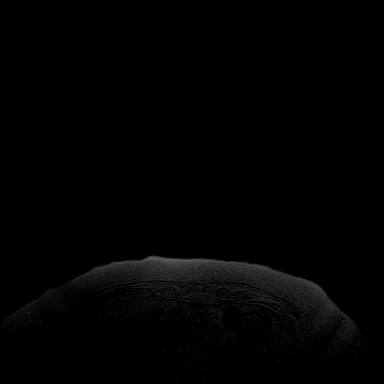
[im 36/144]
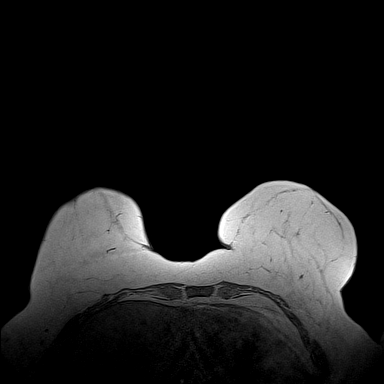
[im 72/144]
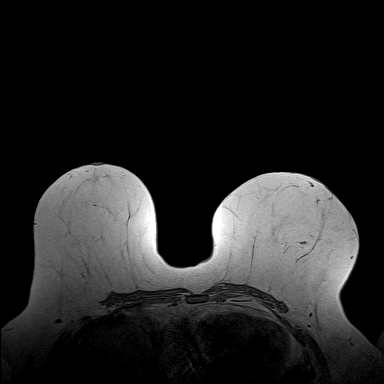
[im 108/144]
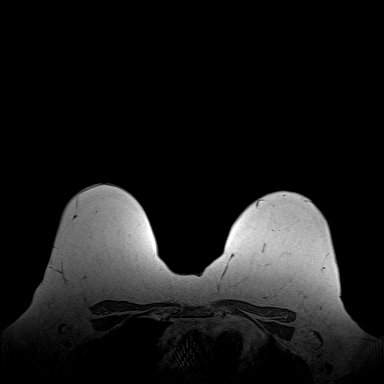
[im 144/144]
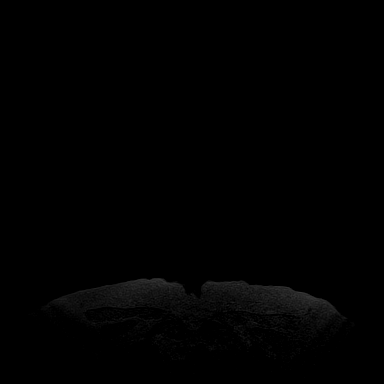

[Series 4: fl3d pre-cm · axial · non-contrast · 1.2mm · 1.04mm/px · z∈[-78,+93]mm · 5 of 144 slices shown]
[im 1/144]
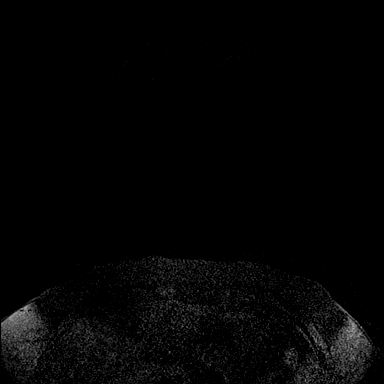
[im 36/144]
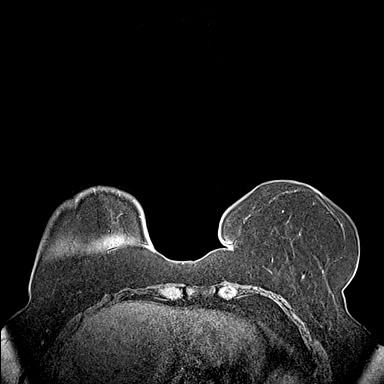
[im 72/144]
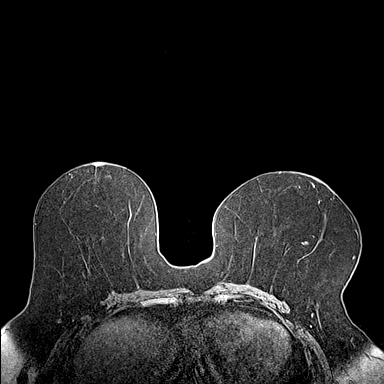
[im 108/144]
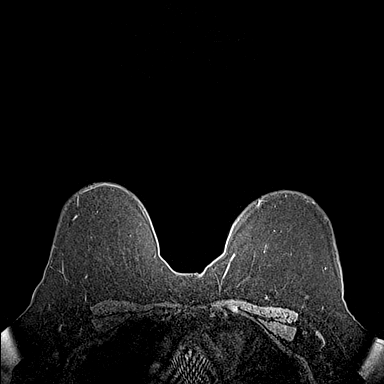
[im 144/144]
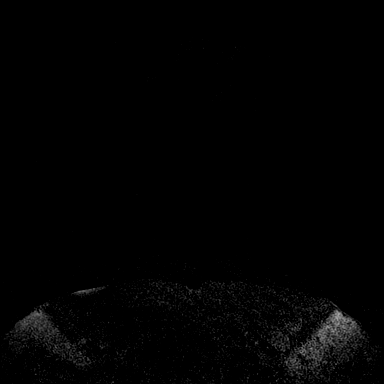

[Series 5: fl3d post-cm 20 · axial · 1.2mm · 1.04mm/px · z∈[-78,+93]mm · 5 of 144 slices shown (1 of 3)]
[im 1/144]
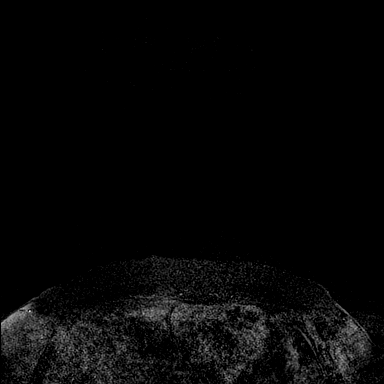
[im 36/144]
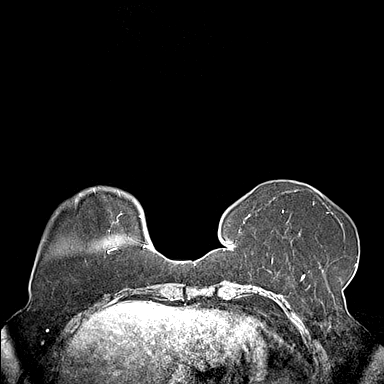
[im 72/144]
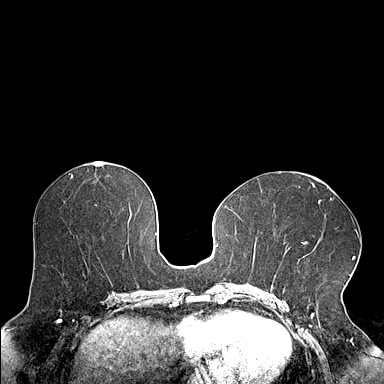
[im 108/144]
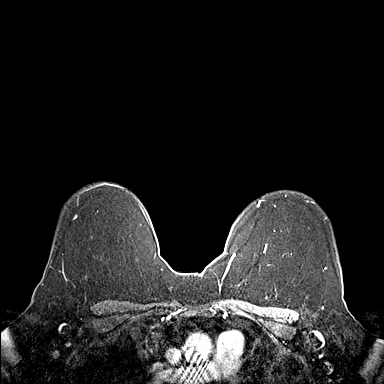
[im 144/144]
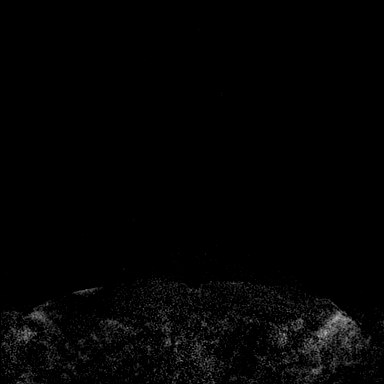

[Series 6: fl3d post-cm 20 · axial · 1.2mm · 1.04mm/px · z∈[-78,+93]mm · 5 of 144 slices shown (2 of 3)]
[im 1/144]
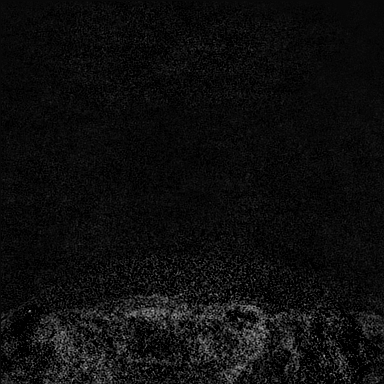
[im 36/144]
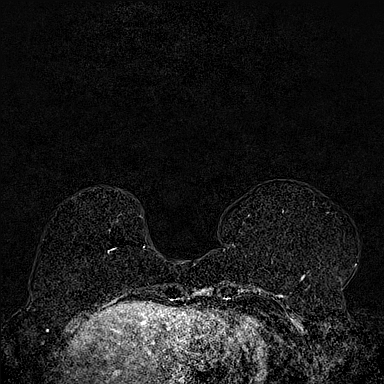
[im 72/144]
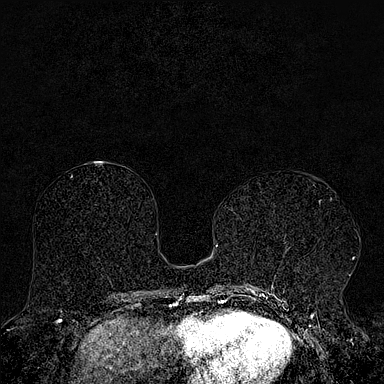
[im 108/144]
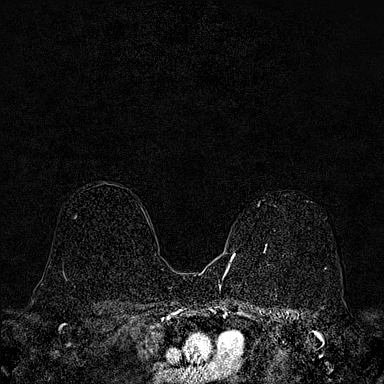
[im 144/144]
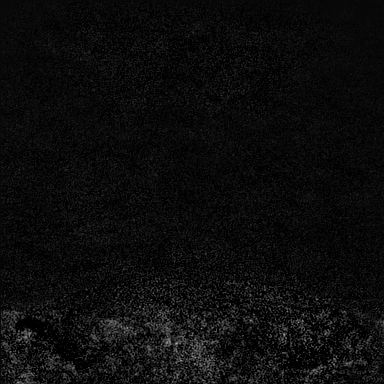

[Series 7: fl3d post-cm 20 · axial · 172.8mm · 1.04mm/px · 1 of 1 slices shown (3 of 3)]
[im 1/1]
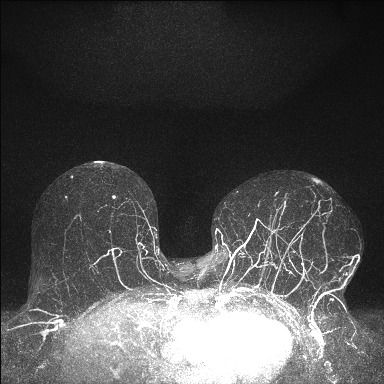

[Series 8: fl3d post-cm 3 · axial · 1.2mm · 1.04mm/px · z∈[-78,+93]mm · 6 of 144 slices shown (1 of 2)]
[im 1/144]
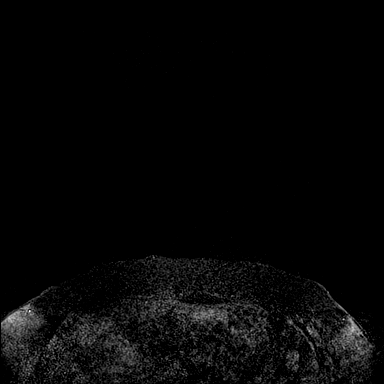
[im 29/144]
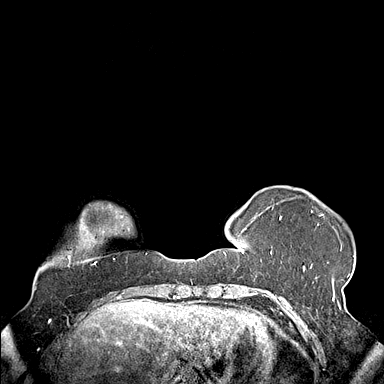
[im 58/144]
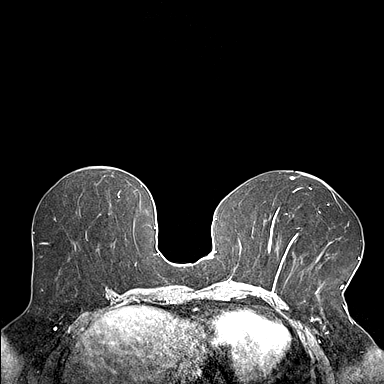
[im 86/144]
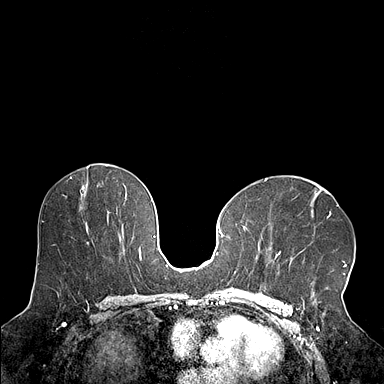
[im 115/144]
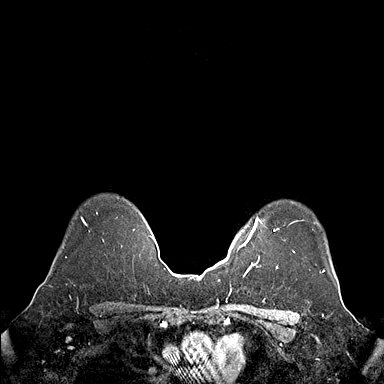
[im 144/144]
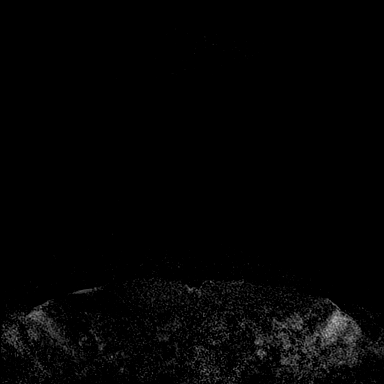

[Series 9: fl3d post-cm 3 · axial · 1.2mm · 1.04mm/px · z∈[-78,-45]mm · 2 of 144 slices shown (2 of 2)]
[im 1/144]
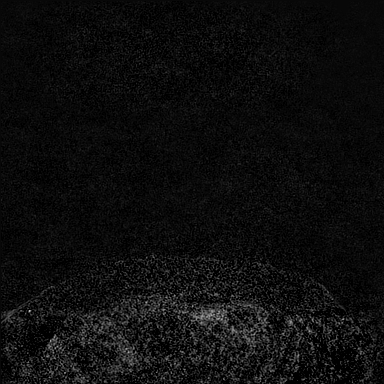
[im 29/144]
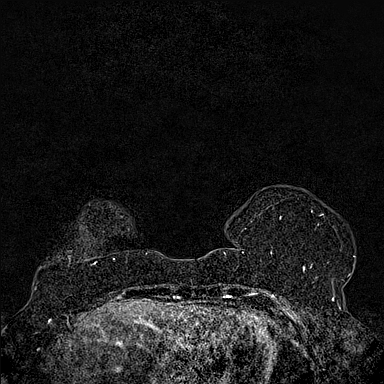

[30 of 48 positions shown; findings below may reference images not displayed]

Three-dimensional MR images were rendered by post-processing of the
original MR data on an independent workstation. The
three-dimensional MR images were interpreted, and findings are
reported in the following complete MRI report for this study. Three
dimensional images were evaluated at the independent interpreting
workstation using the DynaCAD thin client.
FINDINGS: Breast composition: a. Almost entirely fat.

Background parenchymal enhancement: Minimal.

Right breast: Enhancing circumscribed mass in the UPPER INNER
QUADRANT at anterior depth measuring 4 x 4 x 5 mm (AP x transverse x
craniocaudal), demonstrating progressive enhancement kinetics.
Smaller enhancing foci elsewhere indicating background parenchymal
enhancement.

Left breast: No suspicious mass or abnormal enhancement.

Lymph nodes: No pathologic lymphadenopathy.

Ancillary findings:  None.
IMPRESSION: 1. Likely benign 5 mm mass in the UPPER INNER QUADRANT of the RIGHT
breast at anterior depth (demonstrating benign progressive
enhancement kinetics).
2. No MRI evidence of malignancy involving the LEFT breast.
3. No pathologic lymphadenopathy.

RECOMMENDATION:
1. Second-look ultrasound to search for the likely benign 5 mm mass
in the UPPER INNER QUADRANT of the RIGHT breast.
2. If the mass is not identified at second-look ultrasound, MRI
guided core needle biopsy would be recommended for definitive
diagnosis.

BI-RADS CATEGORY  3: Probably benign.

ADDENDUM:
The BI-RADS CATEGORY should read:  4: Suspicious.

*** End of Addendum ***
Three-dimensional MR images were rendered by post-processing of the
original MR data on an independent workstation. The
three-dimensional MR images were interpreted, and findings are
reported in the following complete MRI report for this study. Three
dimensional images were evaluated at the independent interpreting
workstation using the DynaCAD thin client.
FINDINGS: Breast composition: a. Almost entirely fat.

Background parenchymal enhancement: Minimal.

Right breast: Enhancing circumscribed mass in the UPPER INNER
QUADRANT at anterior depth measuring 4 x 4 x 5 mm (AP x transverse x
craniocaudal), demonstrating progressive enhancement kinetics.
Smaller enhancing foci elsewhere indicating background parenchymal
enhancement.

Left breast: No suspicious mass or abnormal enhancement.

Lymph nodes: No pathologic lymphadenopathy.

Ancillary findings:  None.
IMPRESSION: 1. Likely benign 5 mm mass in the UPPER INNER QUADRANT of the RIGHT
breast at anterior depth (demonstrating benign progressive
enhancement kinetics).
2. No MRI evidence of malignancy involving the LEFT breast.
3. No pathologic lymphadenopathy.

RECOMMENDATION:
1. Second-look ultrasound to search for the likely benign 5 mm mass
in the UPPER INNER QUADRANT of the RIGHT breast.
2. If the mass is not identified at second-look ultrasound, MRI
guided core needle biopsy would be recommended for definitive
diagnosis.

BI-RADS CATEGORY  3: Probably benign.

## 2022-01-16 MED ORDER — GADOBUTROL 1 MMOL/ML IV SOLN
8.0000 mL | Freq: Once | INTRAVENOUS | Status: AC | PRN
  Administered 2022-01-16: 8 mL via INTRAVENOUS

## 2022-01-18 ENCOUNTER — Telehealth: Payer: Self-pay | Admitting: Obstetrics and Gynecology

## 2022-01-18 NOTE — Telephone Encounter (Signed)
-----   Message from Volanda Napoleon sent at 01/16/2022  2:22 PM EST ----- Regarding: Breast MRI Recommendations Dr. Quincy Simmonds,  The above patient had a breast MRI on 01/16/22 and below are the recommendations of Dr. Evangeline Dakin.  RECOMMENDATION: 1. Second-look ultrasound to search for the likely benign 5 mm mass in the [place] of the RIGHT breast. 2. If the mass is not identified at second-look ultrasound, MRI guided core needle biopsy would be recommended for definitive diagnosis.  Please review the recommendations with the patient and enter any orders necessary.  The patient may prefer to return to [facility] for the additional imaging.   Thank you.  Rolfe

## 2022-01-18 NOTE — Telephone Encounter (Signed)
Please refer to result note from 01/16/22 regarding breast MRI.   Patient has a normal left breast.  The MRI found a mass in the right breast.   Right breast ultrasound is recommended.  Please schedule at [facility].   If the mass is not identified at the second look ultrasound, [facility] is recommending MRI guided core needle biopsy.   Please place in mammogram hold.

## 2022-01-19 ENCOUNTER — Other Ambulatory Visit: Payer: Self-pay

## 2022-01-19 DIAGNOSIS — N6312 Unspecified lump in the right breast, upper inner quadrant: Secondary | ICD-10-CM

## 2022-01-19 NOTE — Telephone Encounter (Signed)
Thank you for the update.  Ok to continue care with the [facility].

## 2022-01-19 NOTE — Telephone Encounter (Signed)
I spoke with patient regarding result. She wishes to continue her care with [facility]. Appt scheduled for right breast u/s on 01/28/22 at 10:20am at [facility]. Patient was informed.   Placed in [facility] hold.

## 2022-01-22 ENCOUNTER — Telehealth: Payer: Self-pay | Admitting: Family Medicine

## 2022-01-22 ENCOUNTER — Ambulatory Visit (INDEPENDENT_AMBULATORY_CARE_PROVIDER_SITE_OTHER): Payer: BC Managed Care – PPO | Admitting: *Deleted

## 2022-01-22 DIAGNOSIS — J309 Allergic rhinitis, unspecified: Secondary | ICD-10-CM | POA: Diagnosis not present

## 2022-01-22 DIAGNOSIS — J45901 Unspecified asthma with (acute) exacerbation: Secondary | ICD-10-CM

## 2022-01-22 MED ORDER — QVAR REDIHALER 80 MCG/ACT IN AERB: 2.0000 | INHALATION_SPRAY | Freq: Two times a day (BID) | RESPIRATORY_TRACT | 1 refills | Status: DC

## 2022-01-22 NOTE — Telephone Encounter (Signed)
Jacqueline Holmes came in to the office and states that she was supposed to get Qvar at [facility] and they stated they never received it.  She would like it sent in to [facility].

## 2022-01-22 NOTE — Telephone Encounter (Signed)
Sent in refill of qvar to [facility]

## 2022-01-26 ENCOUNTER — Telehealth: Payer: Self-pay

## 2022-01-26 NOTE — Telephone Encounter (Signed)
Pa submitted thru cover my meds for qvar waiting on response from insurance

## 2022-01-27 ENCOUNTER — Other Ambulatory Visit: Payer: Self-pay | Admitting: Family Medicine

## 2022-01-27 ENCOUNTER — Other Ambulatory Visit: Payer: Self-pay | Admitting: Obstetrics and Gynecology

## 2022-01-27 DIAGNOSIS — N6310 Unspecified lump in the right breast, unspecified quadrant: Secondary | ICD-10-CM

## 2022-01-28 ENCOUNTER — Other Ambulatory Visit: Payer: BC Managed Care – PPO

## 2022-01-30 ENCOUNTER — Ambulatory Visit
Admission: RE | Admit: 2022-01-30 | Discharge: 2022-01-30 | Disposition: A | Discharge status: Home / Self Care | Payer: BC Managed Care – PPO | Source: Ambulatory Visit | Attending: Family Medicine | Admitting: Family Medicine

## 2022-01-30 ENCOUNTER — Ambulatory Visit
Admission: RE | Admit: 2022-01-30 | Discharge: 2022-01-30 | Disposition: A | Discharge status: Home / Self Care | Payer: BC Managed Care – PPO | Source: Ambulatory Visit | Attending: Obstetrics and Gynecology | Admitting: Obstetrics and Gynecology

## 2022-01-30 ENCOUNTER — Ambulatory Visit (INDEPENDENT_AMBULATORY_CARE_PROVIDER_SITE_OTHER): Payer: BC Managed Care – PPO

## 2022-01-30 DIAGNOSIS — J309 Allergic rhinitis, unspecified: Secondary | ICD-10-CM

## 2022-01-30 DIAGNOSIS — N6310 Unspecified lump in the right breast, unspecified quadrant: Secondary | ICD-10-CM

## 2022-01-30 DIAGNOSIS — N6312 Unspecified lump in the right breast, upper inner quadrant: Secondary | ICD-10-CM

## 2022-01-30 IMAGING — US US BREAST*R* LIMITED INC AXILLA
1 series · 2 of 2 positions shown · non-contrast
Comparison: Previous exam(s).

CLINICAL DATA: Patient for second-look ultrasound mass within the
upper inner right breast.

EXAM:
ULTRASOUND OF THE RIGHT BREAST

[Series 1: us breast*right* limited inc axilla · 0.06mm/px · 2 of 2 slices shown]
[im 1/2]
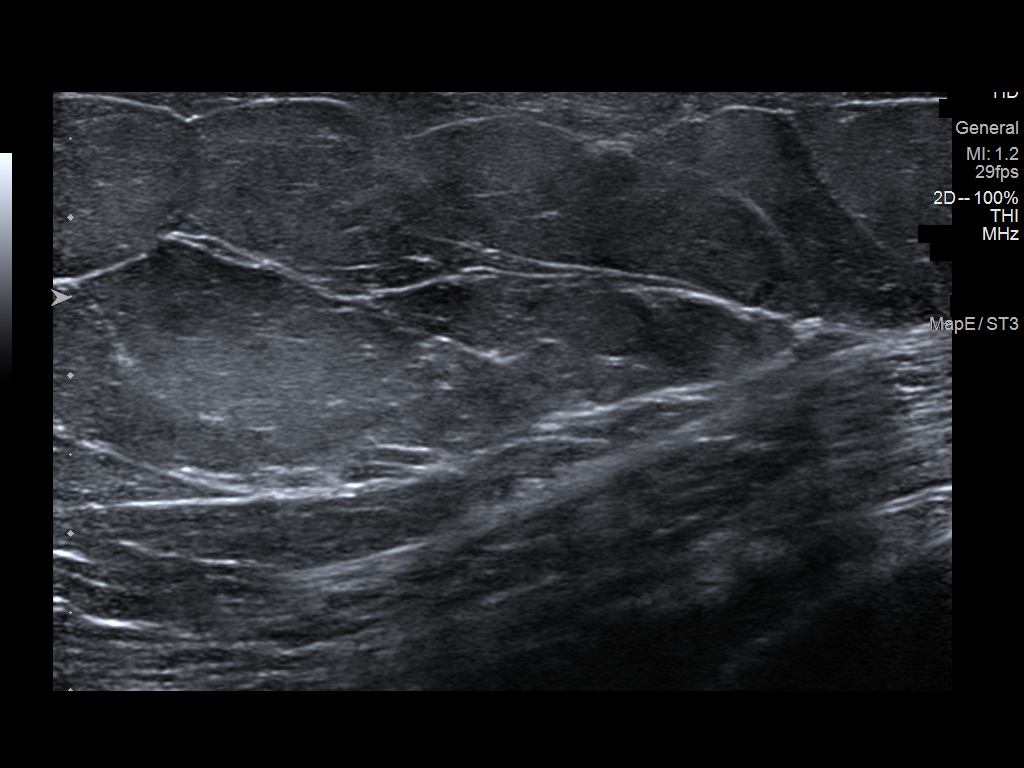
[im 2/2]
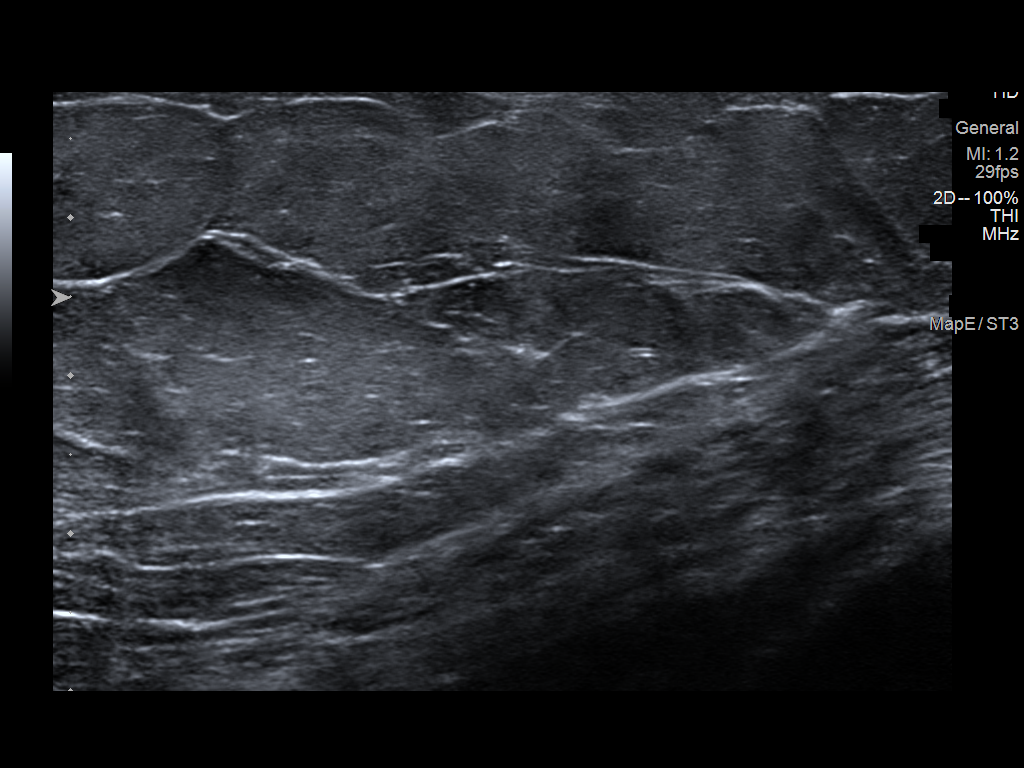

[2 of 2 positions shown; findings below may reference images not displayed]

FINDINGS: Targeted ultrasound is performed, showing no discrete mass within
the upper inner right breast to correspond with the mass identified
on MRI exam.
IMPRESSION: No mass identified within the upper inner quadrant of the right
breast on ultrasound to correspond with the mass identified on
recent MRI.

RECOMMENDATION:
MRI guided core needle biopsy of the right breast mass.

I have discussed the findings and recommendations with the patient.
If applicable, a reminder letter will be sent to the patient
regarding the next appointment.

BI-RADS CATEGORY  1: Negative.

## 2022-02-02 ENCOUNTER — Other Ambulatory Visit: Payer: Self-pay | Admitting: Obstetrics and Gynecology

## 2022-02-02 DIAGNOSIS — R9389 Abnormal findings on diagnostic imaging of other specified body structures: Secondary | ICD-10-CM

## 2022-02-05 ENCOUNTER — Ambulatory Visit
Admission: RE | Admit: 2022-02-05 | Discharge: 2022-02-05 | Disposition: A | Discharge status: Home / Self Care | Payer: BC Managed Care – PPO | Source: Ambulatory Visit | Attending: Obstetrics and Gynecology | Admitting: Obstetrics and Gynecology

## 2022-02-05 ENCOUNTER — Other Ambulatory Visit: Payer: Self-pay

## 2022-02-05 DIAGNOSIS — R9389 Abnormal findings on diagnostic imaging of other specified body structures: Secondary | ICD-10-CM

## 2022-02-05 IMAGING — MG MM BREAST LOCALIZATION CLIP
4 series · 4 of 12 positions shown · non-contrast
Comparison: Previous exam(s).

CLINICAL DATA: Post MRI guided biopsy of an enhancing focus in the
upper inner right breast.

EXAM:
3D DIAGNOSTIC RIGHT MAMMOGRAM POST MRI BIOPSY

[R CC synth-2D]
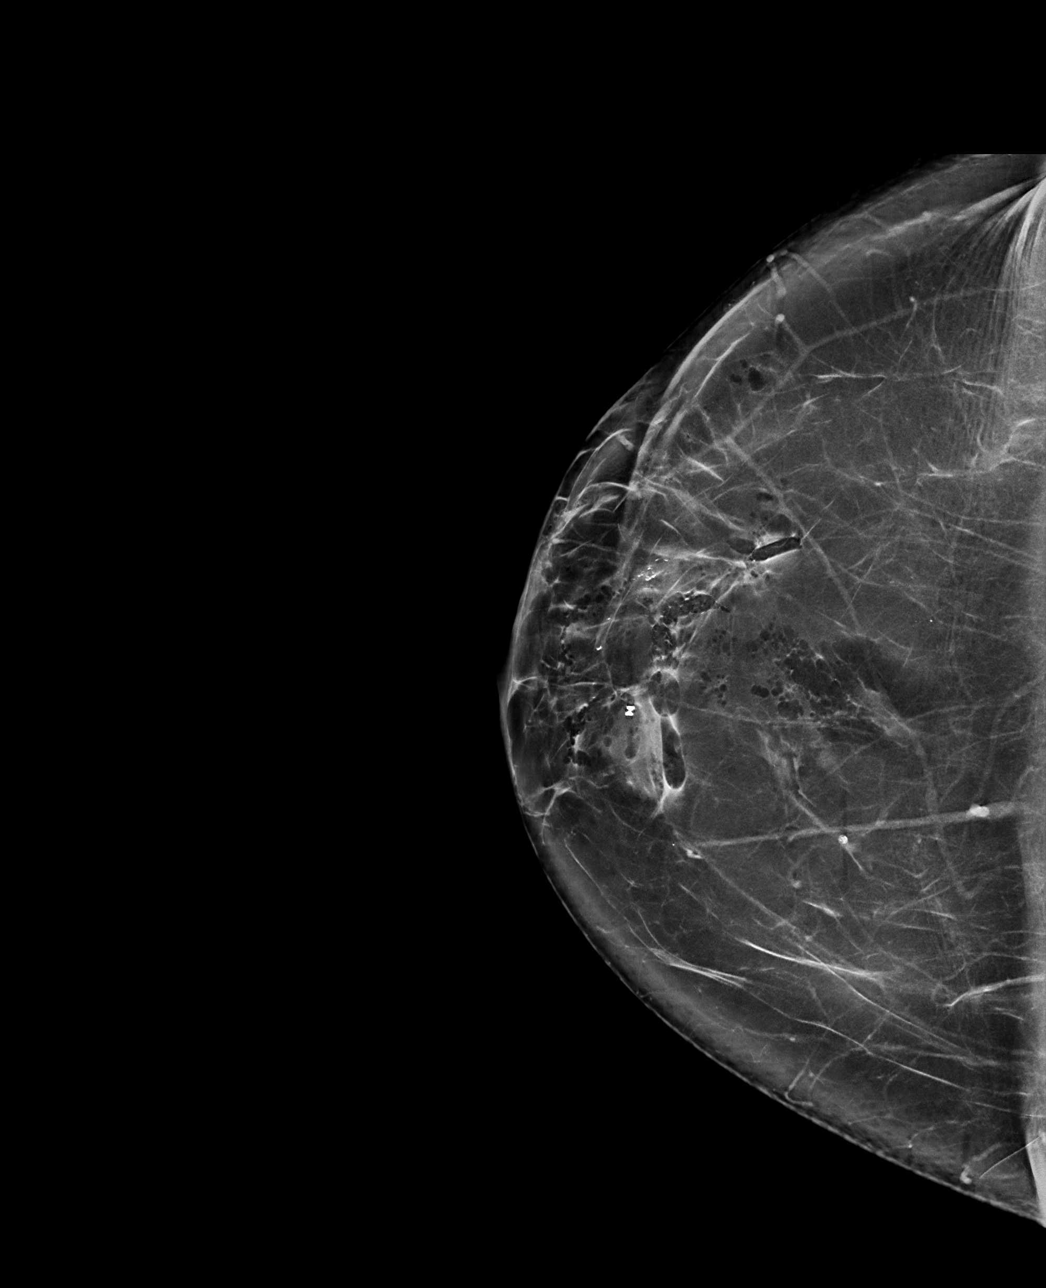

[R ML synth-2D]
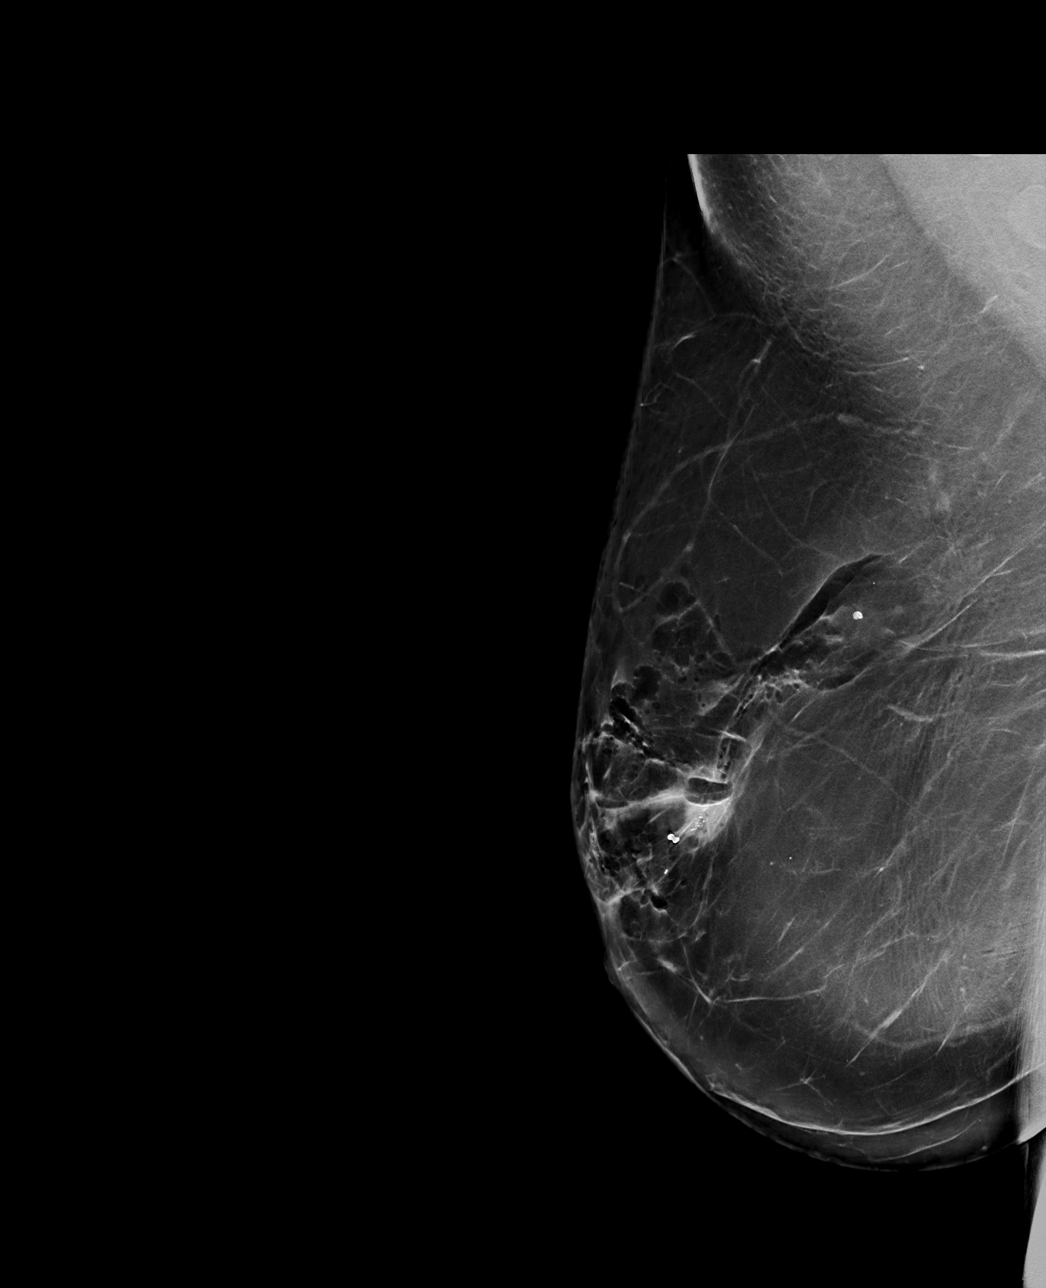

[R CC tomo · tomo slice 45/88.0]
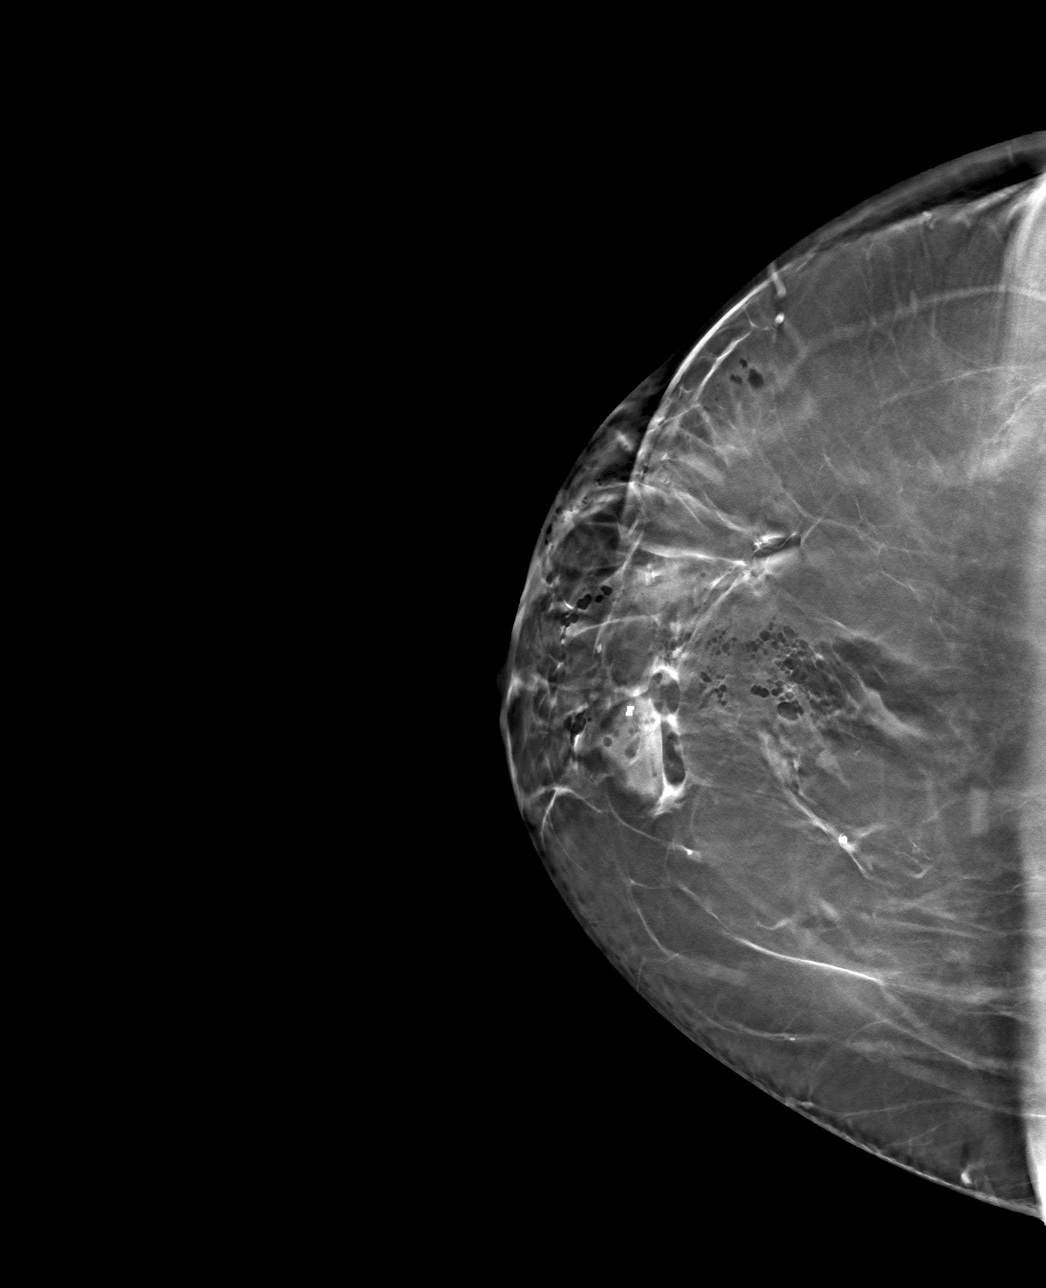

[R ML tomo · tomo slice 51/101.0]
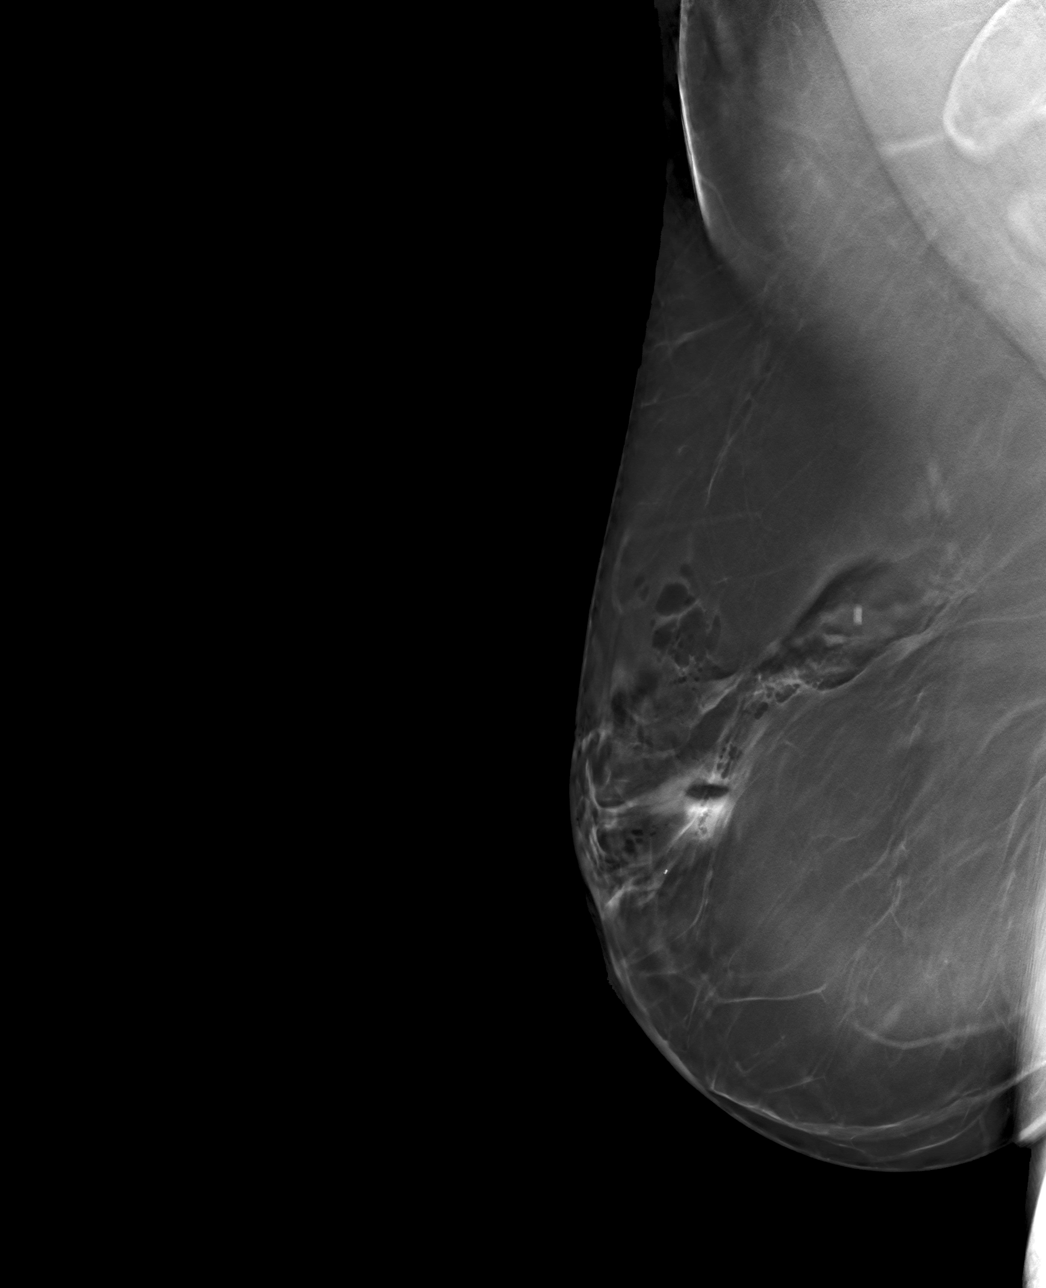

[4 of 12 positions shown; findings below may reference images not displayed]

FINDINGS: 3D Mammographic images were obtained following MRI guided biopsy of
a 0.5 cm enhancing focus in the upper inner right breast. A dumbbell
shaped biopsy marking clip is present in the region of the biopsied
enhancing focus in the upper inner right breast.
IMPRESSION: Dumbbell shaped biopsy marking clip in region of biopsied enhancing
focus in the upper inner right breast.

Final Assessment: Post Procedure Mammograms for Marker Placement

## 2022-02-05 IMAGING — MR MR BREAST BX W/ LOC DEV 1ST LEASION IMAGE BX SPEC MR GUIDE*R*
7 of 10 series · 33 of 48 positions shown · IV contrast (10 ml gadavist)
Comparison: Previous exams.
COMPARISON: Previous exams.

Addendum:
CLINICAL DATA: 50-year-old female presents for MRI guided biopsy of
a 0.5 cm enhancing mass in the upper inner quadrant of the right
breast.

EXAM:
MRI GUIDED CORE NEEDLE BIOPSY OF THE RIGHT BREAST
TECHNIQUE: Multiplanar, multisequence MR imaging of the right breast was
performed both before and after administration of intravenous
contrast.
CONTRAST:  10mL GADAVIST GADOBUTROL 1 MMOL/ML IV SOLN

[Series 2: fiducial unilateral · sagittal · 2.0mm · 1.33mm/px · 3 of 52 slices shown]
[im 1/52]
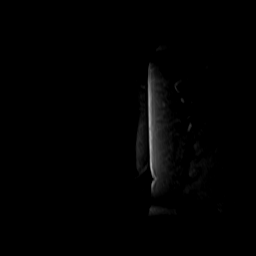
[im 26/52]
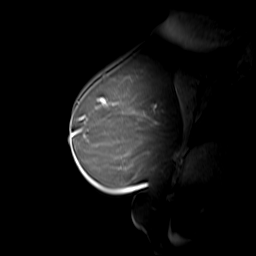
[im 52/52]
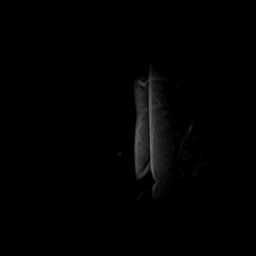

[Series 3: dynamic pre · axial · non-contrast · 1.3mm · 0.73mm/px · z∈[-92,+93]mm · 5 of 144 slices shown]
[im 1/144]
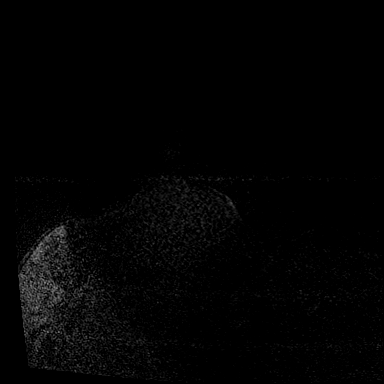
[im 36/144]
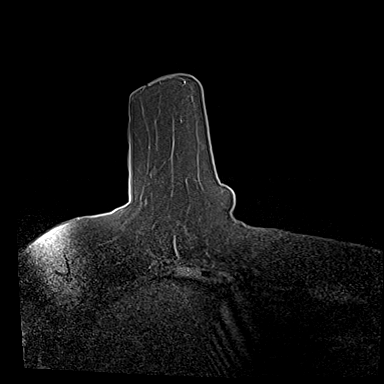
[im 72/144]
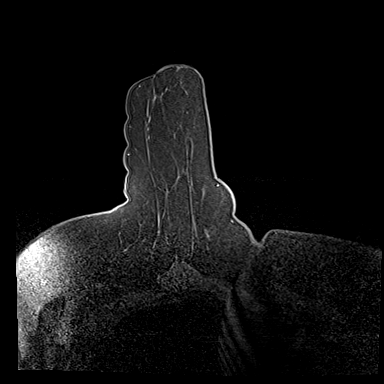
[im 108/144]
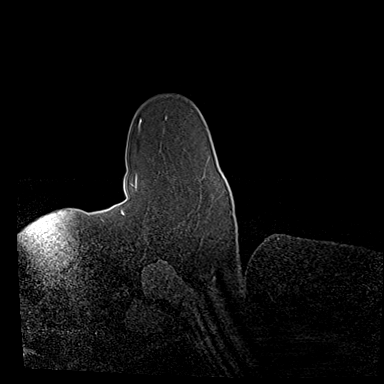
[im 144/144]
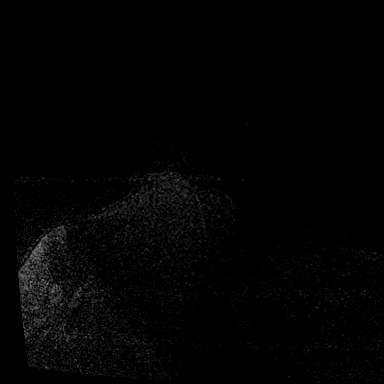

[Series 4: dynamic post 20 · axial · 1.3mm · 0.73mm/px · z∈[-92,+93]mm · 5 of 144 slices shown (1 of 2)]
[im 1/144]
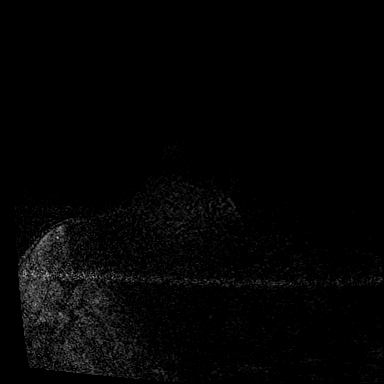
[im 36/144]
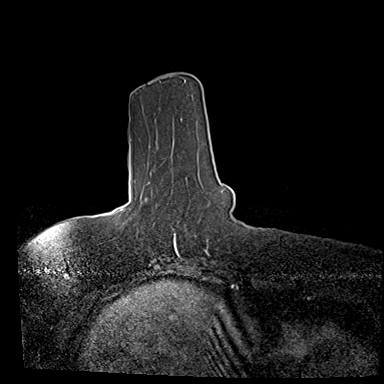
[im 72/144]
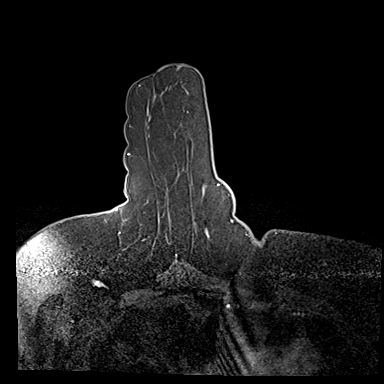
[im 108/144]
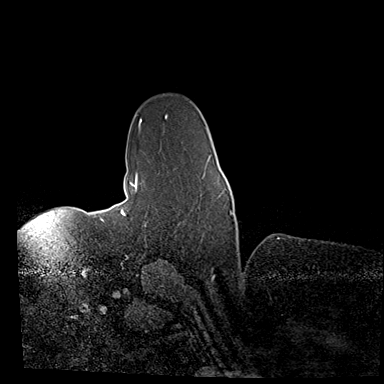
[im 144/144]
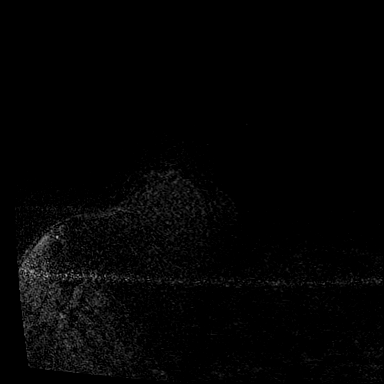

[Series 5: dynamic post 20 · axial · 1.3mm · 0.73mm/px · z∈[-92,+93]mm · 5 of 144 slices shown (2 of 2)]
[im 1/144]
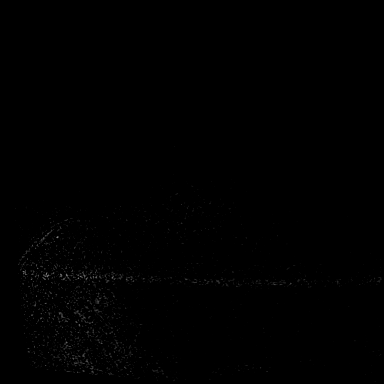
[im 36/144]
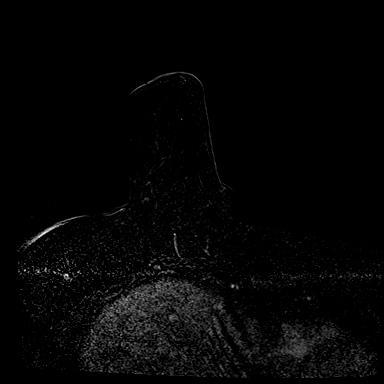
[im 72/144]
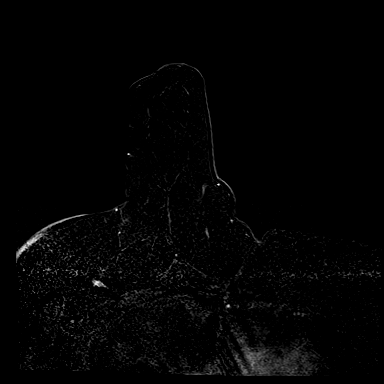
[im 108/144]
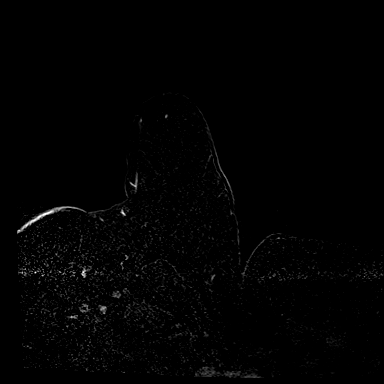
[im 144/144]
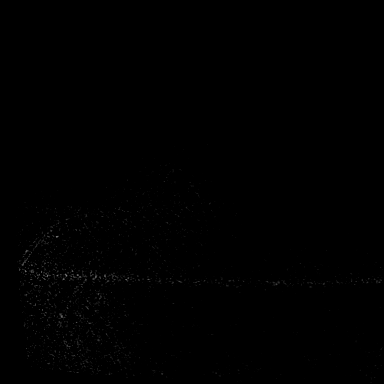

[Series 6: dynamic post 3 · axial · 1.3mm · 0.73mm/px · z∈[-92,+93]mm · 5 of 144 slices shown (1 of 2)]
[im 1/144]
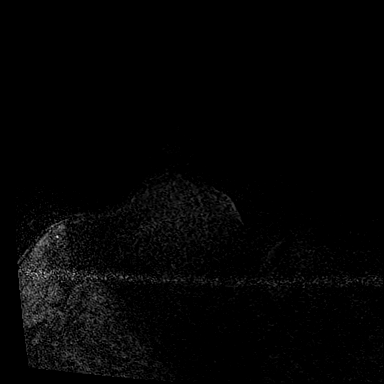
[im 36/144]
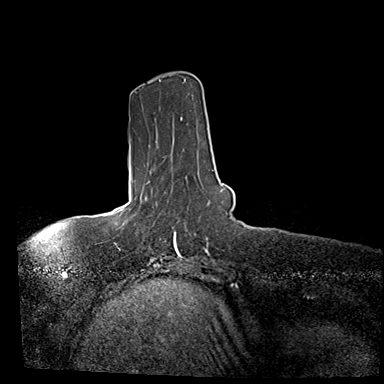
[im 72/144]
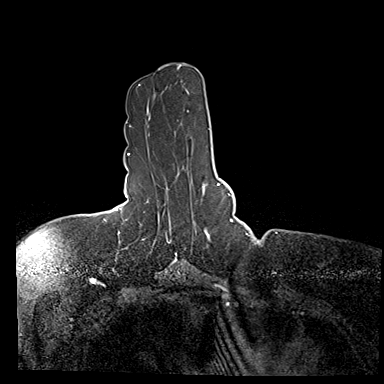
[im 108/144]
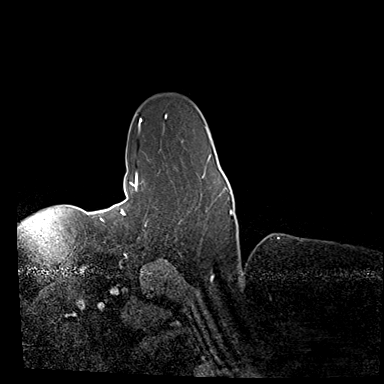
[im 144/144]
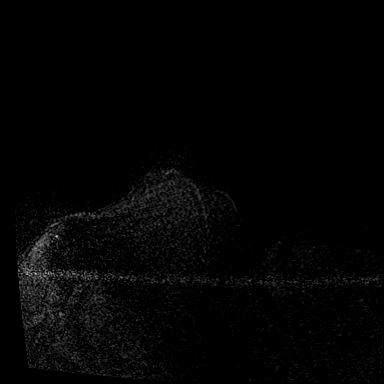

[Series 7: dynamic post 3 · axial · 1.3mm · 0.73mm/px · z∈[-92,+93]mm · 5 of 144 slices shown (2 of 2)]
[im 1/144]
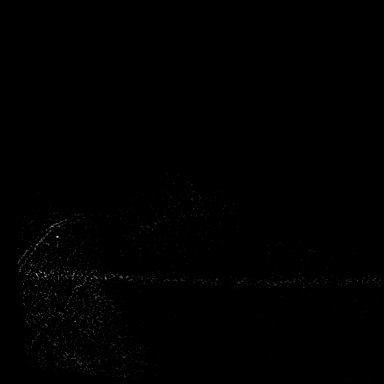
[im 36/144]
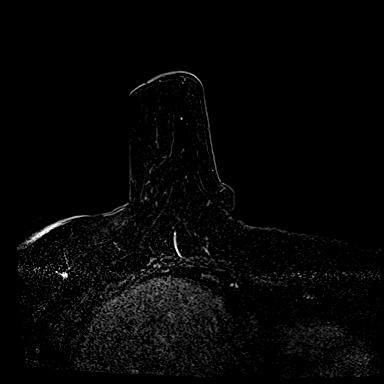
[im 72/144]
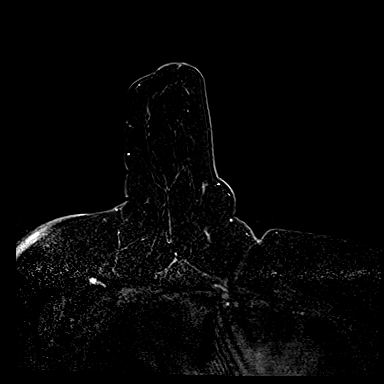
[im 108/144]
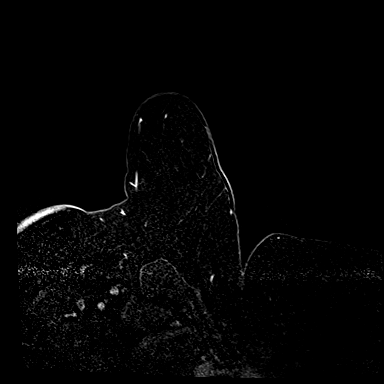
[im 144/144]
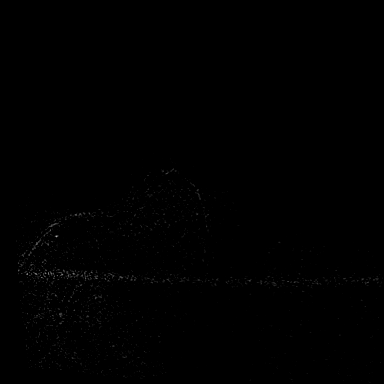

[Series 8: needle confirmation · axial · 1.3mm · 0.73mm/px · z∈[-92,+93]mm · 5 of 144 slices shown]
[im 1/144]
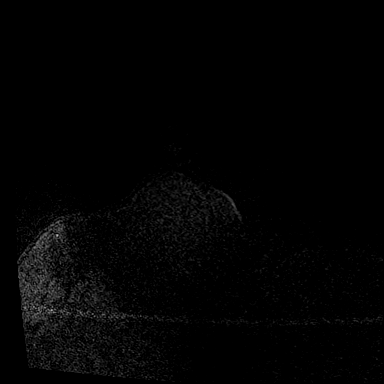
[im 36/144]
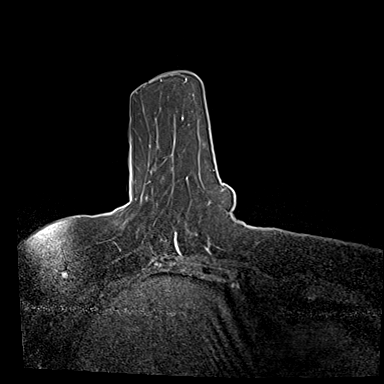
[im 72/144]
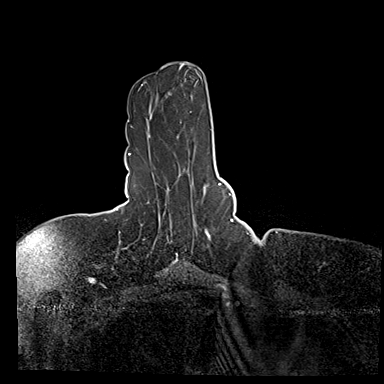
[im 108/144]
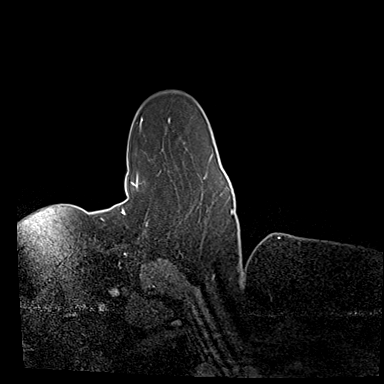
[im 144/144]
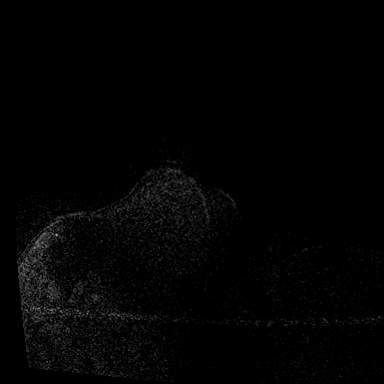

[33 of 48 positions shown; findings below may reference images not displayed]

FINDINGS: I met with the patient, and we discussed the procedure of MRI guided
biopsy, including risks, benefits, and alternatives. Specifically,
we discussed the risks of infection, bleeding, tissue injury, clip
migration, and inadequate sampling. Informed, written consent was
given. The usual time out protocol was performed immediately prior
to the procedure.

Using sterile technique, 1% Lidocaine, MRI guidance, and a 9 gauge
vacuum assisted device, biopsy was performed of the 0.5 cm enhancing
mass/focus in the upper inner right breast using a lateral to medial
approach. At the conclusion of the procedure, a dumbbell shaped
tissue marker clip was deployed into the biopsy cavity. Follow-up
2-view mammogram was performed and dictated separately.
IMPRESSION: MRI guided biopsy of the enhancing mass in the upper inner right
breast. No apparent complications.

ADDENDUM:
Pathology revealed ABUNDANT MATURE ADIPOSE WITH FOCAL FAT NECROSIS,
RARE MAMMARY DUCT WITH ADJACENT STROMAL FIBROSIS of the RIGHT
breast, upper inner quadrant. This was found to be concordant by Dr.
ASUAI.

Pathology results were discussed with the patient by telephone. The
patient reported doing well after the biopsy with tenderness at the
site. Post biopsy instructions and care were reviewed and questions
were answered. The patient was encouraged to call The [REDACTED]

The patient was instructed to return for a bilateral breast MRI in 6
months, per protocol, and to return to [facility] for annual
screening mammogram in [DATE].

Pathology results reported by [facility], RN on [DATE].

*** End of Addendum ***
FINDINGS: I met with the patient, and we discussed the procedure of MRI guided
biopsy, including risks, benefits, and alternatives. Specifically,
we discussed the risks of infection, bleeding, tissue injury, clip
migration, and inadequate sampling. Informed, written consent was
given. The usual time out protocol was performed immediately prior
to the procedure.

Using sterile technique, 1% Lidocaine, MRI guidance, and a 9 gauge
vacuum assisted device, biopsy was performed of the 0.5 cm enhancing
mass/focus in the upper inner right breast using a lateral to medial
approach. At the conclusion of the procedure, a dumbbell shaped
tissue marker clip was deployed into the biopsy cavity. Follow-up
2-view mammogram was performed and dictated separately.
IMPRESSION: MRI guided biopsy of the enhancing mass in the upper inner right
breast. No apparent complications.

## 2022-02-05 MED ORDER — GADOBUTROL 1 MMOL/ML IV SOLN
10.0000 mL | Freq: Once | INTRAVENOUS | Status: AC | PRN
  Administered 2022-02-05: 10 mL via INTRAVENOUS

## 2022-02-09 NOTE — Telephone Encounter (Signed)
PA was denied pt has to fail Flovent and Pulmicort first  ?

## 2022-02-16 ENCOUNTER — Telehealth: Payer: Self-pay | Admitting: Family Medicine

## 2022-02-16 MED ORDER — ALVESCO 80 MCG/ACT IN AERS: 1.0000 | INHALATION_SPRAY | Freq: Two times a day (BID) | RESPIRATORY_TRACT | 1 refills | Status: DC

## 2022-02-16 NOTE — Telephone Encounter (Signed)
Let's please have her try Alvesco 80-2 puffs twice a day. This will take the place of Qvar 80. If she needs to start Symbicort for asthma flare, please stop Alvesco 80 while using Symbicort during the flare.

## 2022-02-16 NOTE — Telephone Encounter (Signed)
Patient is returning a call from [place] please Advise  ?

## 2022-02-16 NOTE — Addendum Note (Signed)
Addended by: Felipa Emory on: 02/16/2022 09:37 AM ? ? Modules accepted: Orders ? ?

## 2022-02-16 NOTE — Telephone Encounter (Signed)
Sent in rx and tried to call pt and inform her of the change lm for her to call us back ?

## 2022-02-16 NOTE — Telephone Encounter (Signed)
Informed pt of inhaler change she stated understanding  ?

## 2022-02-20 ENCOUNTER — Ambulatory Visit (INDEPENDENT_AMBULATORY_CARE_PROVIDER_SITE_OTHER): Payer: BC Managed Care – PPO

## 2022-02-20 DIAGNOSIS — J309 Allergic rhinitis, unspecified: Secondary | ICD-10-CM | POA: Diagnosis not present

## 2022-02-26 ENCOUNTER — Encounter: Payer: Self-pay | Admitting: Obstetrics and Gynecology

## 2022-02-26 ENCOUNTER — Ambulatory Visit (INDEPENDENT_AMBULATORY_CARE_PROVIDER_SITE_OTHER): Payer: BC Managed Care – PPO | Admitting: Obstetrics and Gynecology

## 2022-02-26 ENCOUNTER — Ambulatory Visit (INDEPENDENT_AMBULATORY_CARE_PROVIDER_SITE_OTHER): Payer: BC Managed Care – PPO

## 2022-02-26 VITALS — BP 122/86 | HR 111

## 2022-02-26 DIAGNOSIS — J309 Allergic rhinitis, unspecified: Secondary | ICD-10-CM

## 2022-02-26 DIAGNOSIS — N611 Abscess of the breast and nipple: Secondary | ICD-10-CM | POA: Diagnosis not present

## 2022-02-26 MED ORDER — SULFAMETHOXAZOLE-TRIMETHOPRIM 800-160 MG PO TABS: 1.0000 | ORAL_TABLET | Freq: Two times a day (BID) | ORAL | 0 refills | Status: DC

## 2022-02-26 NOTE — Progress Notes (Signed)
GYNECOLOGY  VISIT ?  ?HPI: ?51 y.o.   Married  Caucasian  female   ?G2P1 with Patient's last menstrual period was 01/28/2017.   ?here for area of concern under left breast. Has not tried opening on own. Reports soreness. Denies fever.  ? ?Painful, sore of left breast, noted last night. ?No drainage.  ?No fever, shakes or chills. ? ?No hx of boils or hidradenitis.  ? ?No breast trauma.  ? ?Had MRI guided right breast biopsy 02/05/22 showing fat necrosis and stromal fibrosis.  ? ?GYNECOLOGIC HISTORY: ?Patient's last menstrual period was 01/28/2017. ?Contraception: PM ?Menopausal hormone therapy: Yes ?Last mammogram: 09/26/21-neg birads 1; Dx Lt. 12/17/21-birads 0, US-birads2; MRI B/L 01/16/22-Rt. Breast birads 4; [place] Rt. 01/30/22- birads 1; Bx/Clip- 02/05/22-abundant mature adipose with focal fat necrosis, rare mammary duct with adjacent stromal fibrosis of rt breast (upper inner quadrant) ?Last pap smear: 01/22/20-WNL, HPV- neg.  ?       ?OB History   ? ? Gravida  ?2  ? Para  ?1  ? Term  ?   ? Preterm  ?   ? AB  ?   ? Living  ?1  ?  ? ? SAB  ?   ? IAB  ?   ? Ectopic  ?   ? Multiple  ?   ? Live Births  ?   ?   ?  ?  ?    ? ?Patient Active Problem List  ? Diagnosis Date Noted  ? Not well controlled moderate persistent asthma 11/07/2021  ? Snoring 11/07/2021  ? Hematuria 04/14/2021  ? Acute left-sided back pain 04/14/2021  ? Sebaceous cyst 04/14/2021  ? Elevated blood pressure reading in office with diagnosis of hypertension 10/22/2020  ? Belching 10/22/2020  ? Nausea 10/22/2020  ? Abdominal pain 10/22/2020  ? Gastroesophageal reflux disease 10/22/2020  ? Early satiety 10/22/2020  ? Seasonal and perennial allergic rhinitis 08/23/2020  ? Genetic testing 03/05/2020  ? Family history of prostate cancer   ? Family history of pancreatic cancer   ? Cysts of both ovaries 02/09/2020  ? Allergic conjunctivitis of both eyes 10/06/2019  ? Elevated ALT measurement 01/28/2018  ? Moderate persistent asthma with acute exacerbation 01/18/2018  ?  Allergic conjunctivitis 01/18/2018  ? Elevated blood-pressure reading without diagnosis of hypertension 01/18/2018  ? Non-seasonal allergic rhinitis due to pollen 05/03/2017  ? Mild persistent asthma, uncomplicated 42/68/3419  ? Thyroid nodule, uninodular 09/28/2011  ? Unspecified hypothyroidism 09/10/2011  ? Allergic rhinitis 09/10/2011  ? Asthma 04/25/2008  ? Contact dermatitis and other eczema due to plants (except food) 08/04/2007  ? ? ?Past Medical History:  ?Diagnosis Date  ? Abnormal uterine bleeding (AUB)   ? march/april 2021  ? Allergy   ? Angio-edema   ? Asthma   ? Cancer [facility])   ? skin cancer removed   ? Cough   ? non prod  ? Diabetes mellitus arising in pregnancy   ? now pre-diabetes  ? Elevated BP without diagnosis of hypertension   ? high with DOCTOR  visits , then goes back to normal   ? Family history of breast cancer   ? MGM  ? Family history of pancreatic cancer   ? Family history of prostate cancer   ? Fatigue   ? Generalized headaches   ? migraines on occasion.  ? GERD (gastroesophageal reflux disease)   ? Graves disease 03/2010  ? Heart murmur   ? History of COVID-19 06/26/2020  ? Hyperlipidemia   ?  Hypothyroidism   ? s/p thyroidectomy for Grave's disease (Dr. Tye Savoy at [place])  ? IBS (irritable bowel syndrome)   ? Incontinence   ? Infertility, female   ? Migraine   ? without aura  ? Plantar fasciitis, bilateral 2021  ? Pre-diabetes   ? Recurrent upper respiratory infection (URI)   ? last uri dec 2020  ? Sinus problem   ? runny nose  ? Sleep apnea   ? mild no cpap needed   ? Uterine polyp   ? ? ?Past Surgical History:  ?Procedure Laterality Date  ? ABDOMINOPLASTY  12/2015  ? Dr. Towanda Malkin  ? BREAST REDUCTION SURGERY  11/03/2018  ? BREAST SURGERY  10/2018  ? breast reduction  ? CESAREAN SECTION    ? x 1  ? DILATATION & CURETTAGE/HYSTEROSCOPY WITH TRUECLEAR N/A 01/16/2014  ? Procedure: DILATATION & CURETTAGE/HYSTEROSCOPY WITH TRUCLEAR;  Surgeon: Marylynn Pearson, MD;  Location: [facility];  Service: Gynecology;  Laterality: N/A;  ? LASIK    ? prk laser eye surgery Left 08/2019  ? ROBOTIC ASSISTED SALPINGO OOPHERECTOMY Bilateral 03/28/2020  ? Procedure: XI ROBOTIC ASSISTED SALPINGO OOPHORECTOMY;;  Surgeon: Lafonda Mosses, MD;  Location: [facility];  Service: Gynecology;  Laterality: Bilateral;  ? skin cancer remove  06/2020  ? TOTAL THYROIDECTOMY  07/30/11  ? Dr. Harlow Asa (Grave's disease)  ? VULVA /PERINEUM BIOPSY N/A 01/16/2014  ? Procedure: VULVAR BIOPSY;  Surgeon: Marylynn Pearson, MD;  Location: [facility];  Service: Gynecology;  Laterality: N/A;  ? ? ?Current Outpatient Medications  ?Medication Sig Dispense Refill  ? acetaminophen (TYLENOL) 500 MG tablet Take 1,000 mg by mouth every 6 (six) hours as needed for headache. Reported on 06/17/2016    ? albuterol (VENTOLIN HFA) 108 (90 Base) MCG/ACT inhaler Inhale 2 puffs into the lungs every 4 (four) hours as needed for wheezing or shortness of breath. 8 g 0  ? ciclesonide (ALVESCO) 80 MCG/ACT inhaler Inhale 1 puff into the lungs 2 (two) times daily. 2 each 1  ? Fexofenadine HCl (ALLEGRA PO) Take by mouth.    ? guaiFENesin (MUCINEX) 600 MG 12 hr tablet Take 600 mg by mouth 2 (two) times daily.    ? levocetirizine (XYZAL) 5 MG tablet TAKE ONE TABLET BY MOUTH EVERY EVENING **NEED OFFICE VISIT** 30 tablet 5  ? montelukast (SINGULAIR) 10 MG tablet Take 1 tablet (10 mg total) by mouth at bedtime. 30 tablet 5  ? omeprazole (PRILOSEC) 40 MG capsule TAKE ONE CAPSULE BY MOUTH TWICE DAILY. 60 capsule 2  ? Spacer/Aero-Holding Chambers [facility] DIAMOND) DEVI Use as directed with inhaler. Dx:  J45.40 Asthma (Patient taking differently: Use as directed with inhaler. Dx:  J45.40 Asthma) 1 Device 2  ? sulfamethoxazole-trimethoprim (BACTRIM DS) 800-160 MG tablet Take 1 tablet by mouth 2 (two) times daily. Take for one week. 14 tablet 0  ? SYMBICORT 160-4.5 MCG/ACT inhaler Two puffs with spacer device twice a day during asthma flares. 10.2 g 5  ?  thyroid (NP THYROID) 90 MG tablet Take 1 tablet (90 mg total) by mouth in the morning and at bedtime. 60 tablet 1  ? beclomethasone (QVAR REDIHALER) 80 MCG/ACT inhaler Inhale 2 puffs into the lungs 2 (two) times daily. (Patient not taking: Reported on 02/26/2022) 10.6 g 1  ? estradiol (ESTRACE) 0.5 MG tablet Take one half tablet (0.25 mg) by mouth daily for one month.  Then take one half tablet by mouth every other day for one month, and then stop  all hormones. (Patient not taking: Reported on 02/26/2022) 30 tablet 0  ? progesterone (PROMETRIUM) 100 MG capsule Take 1 capsule (100 mg total) by mouth at bedtime. Take one capsule at night as long as you are taking the estradiol.  You may stop the Prometrium when you stop the estradiol. (Patient not taking: Reported on 02/26/2022) 30 capsule 1  ? ?No current facility-administered medications for this visit.  ?  ? ?ALLERGIES: Patient has no known allergies. ? ?Family History  ?Problem Relation Age of Onset  ? Prostate cancer Father   ?     dx. in his 50s  ? Allergic rhinitis Mother   ? Asthma Mother   ? Alpha-1 antitrypsin deficiency Mother   ? COPD Mother   ? Diabetes Maternal Grandmother   ? Pancreatic cancer Maternal Grandmother   ?     dx. in her early 67s  ? Heart disease Maternal Grandfather   ?     CABG in 60's  ? Diabetes Maternal Grandfather   ? Colon polyps Maternal Grandfather   ? Diabetes Paternal Grandmother   ? Diabetes Paternal Grandfather   ? Heart disease Paternal Grandfather   ? Prostate cancer Paternal Uncle   ?     dx. in his 23s  ? Angioedema Neg Hx   ? Eczema Neg Hx   ? Colon cancer Neg Hx   ? Esophageal cancer Neg Hx   ? Rectal cancer Neg Hx   ? Stomach cancer Neg Hx   ? ? ?Social History  ? ?Socioeconomic History  ? Marital status: Married  ?  Spouse name: Not on file  ? Number of children: 1  ? Years of education: Not on file  ? Highest education level: Not on file  ?Occupational History  ? Occupation: Marketing executive  ?  Employer:  [employer]  ?Tobacco Use  ? Smoking status: Never  ? Smokeless tobacco: Never  ?Vaping Use  ? Vaping Use: Never used  ?Substance and Sexual Activity  ? Alcohol use: Yes  ?  Comment: once a month  ? Dru

## 2022-03-02 ENCOUNTER — Other Ambulatory Visit (INDEPENDENT_AMBULATORY_CARE_PROVIDER_SITE_OTHER): Payer: BC Managed Care – PPO

## 2022-03-02 ENCOUNTER — Encounter: Payer: Self-pay | Admitting: Physician Assistant

## 2022-03-02 ENCOUNTER — Ambulatory Visit (INDEPENDENT_AMBULATORY_CARE_PROVIDER_SITE_OTHER): Payer: BC Managed Care – PPO | Admitting: Physician Assistant

## 2022-03-02 VITALS — BP 140/92 | HR 97 | Ht 61.0 in | Wt 189.0 lb

## 2022-03-02 DIAGNOSIS — K1379 Other lesions of oral mucosa: Secondary | ICD-10-CM | POA: Diagnosis not present

## 2022-03-02 DIAGNOSIS — K219 Gastro-esophageal reflux disease without esophagitis: Secondary | ICD-10-CM

## 2022-03-02 LAB — IBC + FERRITIN
Ferritin: 66.5 ng/mL (ref 10.0–291.0)
Iron: 67 ug/dL (ref 42–145)
Saturation Ratios: 19.1 % — ABNORMAL LOW (ref 20.0–50.0)
TIBC: 350 ug/dL (ref 250.0–450.0)
Transferrin: 250 mg/dL (ref 212.0–360.0)

## 2022-03-02 LAB — VITAMIN D 25 HYDROXY (VIT D DEFICIENCY, FRACTURES): VITD: 34.39 ng/mL (ref 30.00–100.00)

## 2022-03-02 LAB — B12 AND FOLATE PANEL
Folate: 11.5 ng/mL
Vitamin B-12: 211 pg/mL (ref 211–911)

## 2022-03-02 MED ORDER — FAMOTIDINE 40 MG PO TABS: 40.0000 mg | ORAL_TABLET | Freq: Two times a day (BID) | ORAL | 3 refills | Status: DC

## 2022-03-02 MED ORDER — LIDOCAINE VISCOUS HCL 2 % MT SOLN: 5.0000 mL | Freq: Four times a day (QID) | OROMUCOSAL | 2 refills | Status: DC | PRN

## 2022-03-02 NOTE — Progress Notes (Signed)
? ?Subjective:  ? ? Patient ID: Jacqueline Holmes, female    DOB: 1971/07/29, 51 y.o.   MRN: [id number] ? ?HPI ?Jacqueline Holmes is a pleasant 51 year old white female, established with Dr. Tarri Glenn who was last seen in our office about a year ago. ?She had colonoscopy in January 2022 with finding of multiple ulcers in the proximal transverse colon, hepatic flexure ,ascending colon and sigmoid diverticulosis 2 small adenomatous polyps and internal hemorrhoids.  Path from the colon biopsies most consistent with ischemic colitis. ?She had EGD in March 2022 done for complaints of nausea and reflux type symptoms as well as epigastric discomfort.  She was found to have a few gastric erosions, no hiatal hernia.  Biopsies showed no evidence of eosinophilic esophagitis, no pathologic findings of the esophagus, no evidence of H. pylori, mild peptic duodenitis. ? ?She has been on omeprazole 40 mg p.o. twice daily over the past year or so.  She says this works well to control her reflux symptoms but has developed sensitivity and burning sensation in her mouth and on her tongue which has been gotten progressively more severe to the point of being intolerable.  This has been going on over the past 6 or 7 weeks.  She is not aware of having any ulcerations or plaques in her mouth.  Symptoms are much worse after spicy foods or salty foods.  No dysphagia or odynophagia. ?She has not been on any new medications or supplements, no changes in toothpaste etc. ?She is currently on a course of Bactrim but says that the symptoms predated the Bactrim by several weeks. ?She has tried coming off of PPI but fairly immediately will develop nausea, queasiness, especially worse on an empty stomach. ? ?Review of Systems Pertinent positive and negative review of systems were noted in the above HPI section.  All other review of systems was otherwise negative.  ? ?Outpatient Encounter Medications as of 03/02/2022  ?Medication Sig  ? acetaminophen (TYLENOL) 500 MG  tablet Take 1,000 mg by mouth every 6 (six) hours as needed for headache. Reported on 06/17/2016  ? albuterol (VENTOLIN HFA) 108 (90 Base) MCG/ACT inhaler Inhale 2 puffs into the lungs every 4 (four) hours as needed for wheezing or shortness of breath.  ? ciclesonide (ALVESCO) 80 MCG/ACT inhaler Inhale 1 puff into the lungs 2 (two) times daily.  ? famotidine (PEPCID) 40 MG tablet Take 1 tablet (40 mg total) by mouth 2 (two) times daily. Before meals  ? Fexofenadine HCl (ALLEGRA PO) Take by mouth.  ? levocetirizine (XYZAL) 5 MG tablet TAKE ONE TABLET BY MOUTH EVERY EVENING **NEED OFFICE VISIT**  ? magic mouthwash (lidocaine, diphenhydrAMINE, alum & mag hydroxide) suspension Swish and spit 5 mLs 4 (four) times daily as needed for mouth pain.  ? montelukast (SINGULAIR) 10 MG tablet Take 1 tablet (10 mg total) by mouth at bedtime.  ? omeprazole (PRILOSEC) 40 MG capsule TAKE ONE CAPSULE BY MOUTH TWICE DAILY.  ? Spacer/Aero-Holding Chambers [facility] DIAMOND) DEVI Use as directed with inhaler. Dx:  J45.40 Asthma (Patient taking differently: Use as directed with inhaler. Dx:  J45.40 Asthma)  ? sulfamethoxazole-trimethoprim (BACTRIM DS) 800-160 MG tablet Take 1 tablet by mouth 2 (two) times daily. Take for one week.  ? SYMBICORT 160-4.5 MCG/ACT inhaler Two puffs with spacer device twice a day during asthma flares.  ? thyroid (NP THYROID) 90 MG tablet Take 1 tablet (90 mg total) by mouth in the morning and at bedtime.  ? beclomethasone (QVAR REDIHALER) 80 MCG/ACT inhaler  Inhale 2 puffs into the lungs 2 (two) times daily. (Patient not taking: Reported on 03/02/2022)  ? estradiol (ESTRACE) 0.5 MG tablet Take one half tablet (0.25 mg) by mouth daily for one month.  Then take one half tablet by mouth every other day for one month, and then stop all hormones. (Patient not taking: Reported on 03/02/2022)  ? guaiFENesin (MUCINEX) 600 MG 12 hr tablet Take 600 mg by mouth 2 (two) times daily. (Patient not taking: Reported on 03/02/2022)   ? progesterone (PROMETRIUM) 100 MG capsule Take 1 capsule (100 mg total) by mouth at bedtime. Take one capsule at night as long as you are taking the estradiol.  You may stop the Prometrium when you stop the estradiol. (Patient not taking: Reported on 03/02/2022)  ? [DISCONTINUED] metFORMIN (GLUCOPHAGE-XR) 500 MG 24 hr tablet Take 2 tablets (1,000 mg total) by mouth daily with breakfast.  ? ?No facility-administered encounter medications on file as of 03/02/2022.  ? ?No Known Allergies ?Patient Active Problem List  ? Diagnosis Date Noted  ? Not well controlled moderate persistent asthma 11/07/2021  ? Snoring 11/07/2021  ? Hematuria 04/14/2021  ? Acute left-sided back pain 04/14/2021  ? Sebaceous cyst 04/14/2021  ? Elevated blood pressure reading in office with diagnosis of hypertension 10/22/2020  ? Belching 10/22/2020  ? Nausea 10/22/2020  ? Abdominal pain 10/22/2020  ? Gastroesophageal reflux disease 10/22/2020  ? Early satiety 10/22/2020  ? Seasonal and perennial allergic rhinitis 08/23/2020  ? Genetic testing 03/05/2020  ? Family history of prostate cancer   ? Family history of pancreatic cancer   ? Cysts of both ovaries 02/09/2020  ? Allergic conjunctivitis of both eyes 10/06/2019  ? Elevated ALT measurement 01/28/2018  ? Moderate persistent asthma with acute exacerbation 01/18/2018  ? Allergic conjunctivitis 01/18/2018  ? Elevated blood-pressure reading without diagnosis of hypertension 01/18/2018  ? Non-seasonal allergic rhinitis due to pollen 05/03/2017  ? Mild persistent asthma, uncomplicated 24/23/5361  ? Thyroid nodule, uninodular 09/28/2011  ? Unspecified hypothyroidism 09/10/2011  ? Allergic rhinitis 09/10/2011  ? Asthma 04/25/2008  ? Contact dermatitis and other eczema due to plants (except food) 08/04/2007  ? ?Social History  ? ?Socioeconomic History  ? Marital status: Married  ?  Spouse name: Not on file  ? Number of children: 1  ? Years of education: Not on file  ? Highest education level: Not on file   ?Occupational History  ? Occupation: Marketing executive  ?  Employer: [employer]  ?Tobacco Use  ? Smoking status: Never  ? Smokeless tobacco: Never  ?Vaping Use  ? Vaping Use: Never used  ?Substance and Sexual Activity  ? Alcohol use: Yes  ?  Comment: once a month  ? Drug use: No  ? Sexual activity: Yes  ?  Partners: Male  ?  Birth control/protection: None  ?  Comment: postmenopausal (LMP3/2018)  ?Other Topics Concern  ? Not on file  ?Social History Narrative  ? Divorced and re-married.  Lives with her husband.  Son is grown and lives in own apartment  ? ?Social Determinants of Health  ? ?Financial Resource Strain: Not on file  ?Food Insecurity: Not on file  ?Transportation Needs: Not on file  ?Physical Activity: Not on file  ?Stress: Not on file  ?Social Connections: Not on file  ?Intimate Partner Violence: Not on file  ? ? ?Ms. Casino's family history includes Allergic rhinitis in her mother; Alpha-1 antitrypsin deficiency in her mother; Asthma in her mother; COPD in her  mother; Colon polyps in her maternal grandfather; Diabetes in her maternal grandfather, maternal grandmother, paternal grandfather, and paternal grandmother; Heart disease in her maternal grandfather and paternal grandfather; Pancreatic cancer in her maternal grandmother; Prostate cancer in her father and paternal uncle. ? ? ?   ?Objective:  ?  ?Vitals:  ? 03/02/22 0903  ?BP: (!) 140/92  ?Pulse: 97  ?SpO2: 99%  ? ? ?Physical Exam Well-developed well-nourished white female in no acute distress.  Height, Weight, 189 BMI 35.7 ? ?HEENT; nontraumatic normocephalic, EOMI, PE R LA, sclera anicteric. ?Oropharynx; mild fissuring of the lips, slight fissuring of the tongue, no obvious plaques or lesions, no obvious thrush ?Neck; supple, no JVD ?Cardiovascular; regular rate and rhythm with S1-S2, no murmur rub or gallop ?Pulmonary; Clear bilaterally ?Abdomen; soft, nontender, nondistended, no palpable mass or hepatosplenomegaly, bowel sounds  are active ?Rectal; not done today ?Skin; benign exam, no jaundice rash or appreciable lesions ?Extremities; no clubbing cyanosis or edema skin warm and dry ?Neuro/Psych; alert and oriented x4, grossly no

## 2022-03-02 NOTE — Patient Instructions (Addendum)
Your provider has requested that you go to the basement level for lab work before leaving today. Press "B" on the elevator. The lab is located at the first door on the left as you exit the elevator.  ? ?We have sent the following medications to your pharmacy for you to pick up at your convenience:  [place] ? ?Call in 2 weeks to speak with Dr Tarri Glenn nurse in 2-3 weeks if no improvemets  ? ?Due to recent changes in healthcare laws, you may see the results of your imaging and laboratory studies on MyChart before your provider has had a chance to review them.  We understand that in some cases there may be results that are confusing or concerning to you. Not all laboratory results come back in the same time frame and the provider may be waiting for multiple results in order to interpret others.  Please give [place] 48 hours in order for your provider to thoroughly review all the results before contacting the office for clarification of your results.   ? ?If you are age 52 or older, your body mass index should be between 23-30. Your Body mass index is 35.71 kg/m?Marland Kitchen If this is out of the aforementioned range listed, please consider follow up with your Primary Care Provider. ? ?If you are age 52 or younger, your body mass index should be between 19-25. Your Body mass index is 35.71 kg/m?Marland Kitchen If this is out of the aformentioned range listed, please consider follow up with your Primary Care Provider.  ? ?________________________________________________________ ? ?The [place] [facility] providers would like to encourage you to use [facility] to communicate with providers for non-urgent requests or questions.  Due to long hold times on the telephone, sending your provider a message by [facility] may be a faster and more efficient way to get a response.  Please allow 48 business hours for a response.  Please remember that this is for non-urgent requests.  ?_______________________________________________________  ? ?I appreciate the   opportunity to care for you ? ?Thank You  ? ?Amy Esterwood,PA-C ?

## 2022-03-02 NOTE — Progress Notes (Signed)
Reviewed.  Christmas Faraci L. Josanna Hefel, MD, MPH  

## 2022-03-03 ENCOUNTER — Other Ambulatory Visit: Payer: Self-pay

## 2022-03-03 MED ORDER — CYANOCOBALAMIN 1000 MCG/ML IJ SOLN: INTRAMUSCULAR | 0 refills | Status: DC

## 2022-03-03 MED ORDER — ERGOCALCIFEROL 1.25 MG (50000 UT) PO CAPS: 50000.0000 [IU] | ORAL_CAPSULE | ORAL | 0 refills | Status: DC

## 2022-03-03 MED ORDER — CYANOCOBALAMIN 1000 MCG/ML IJ SOLN: 1000.0000 ug | Freq: Once | INTRAMUSCULAR | 11 refills | Status: AC

## 2022-03-04 ENCOUNTER — Encounter: Payer: Self-pay | Admitting: Family Medicine

## 2022-03-04 NOTE — Telephone Encounter (Signed)
Can you please run the Alvesco through [place] with a coupon-2 for $50 please.  Please send me a message if this does not go through for the price listed above.  Thank you

## 2022-03-05 ENCOUNTER — Telehealth: Payer: Self-pay | Admitting: *Deleted

## 2022-03-05 ENCOUNTER — Ambulatory Visit (INDEPENDENT_AMBULATORY_CARE_PROVIDER_SITE_OTHER): Payer: BC Managed Care – PPO | Admitting: Gastroenterology

## 2022-03-05 DIAGNOSIS — E538 Deficiency of other specified B group vitamins: Secondary | ICD-10-CM

## 2022-03-05 MED ORDER — CYANOCOBALAMIN 1000 MCG/ML IJ SOLN
1000.0000 ug | INTRAMUSCULAR | Status: DC
  Administered 2022-03-05: 1000 ug via INTRAMUSCULAR

## 2022-03-05 NOTE — Telephone Encounter (Signed)
PA has been submitted through CoverMyMeds for [employer] and is currently pending approval/denial.  

## 2022-03-06 ENCOUNTER — Encounter: Payer: Self-pay | Admitting: Gastroenterology

## 2022-03-09 ENCOUNTER — Ambulatory Visit (INDEPENDENT_AMBULATORY_CARE_PROVIDER_SITE_OTHER): Payer: BC Managed Care – PPO

## 2022-03-09 DIAGNOSIS — J309 Allergic rhinitis, unspecified: Secondary | ICD-10-CM

## 2022-03-09 NOTE — Telephone Encounter (Signed)
Called and spoke with patients insurance, they stated that PA was cancelled due to two denied PA's. They are going to fax an appeal for me to work on for the patient.  ?

## 2022-03-12 ENCOUNTER — Ambulatory Visit: Payer: BC Managed Care – PPO | Admitting: Gastroenterology

## 2022-03-12 NOTE — Progress Notes (Signed)
Pt presents today for nurse visit for an injection. Pt states she developed an adverse reaction following her 4/6 inj. Called after hours and spoke with Dr. Collene Mares who advised Benadryl. However pt did not get any relief to her symptoms following Benadryl. Pt was then started on a steroid taper. Discussed this reaction with BOTH Dr. Tarri Glenn and Nicoletta Ba, PA-C. Both agreed, pt should not receive inj today. Pt may take OTC supplement and MUST follow up with her PCP to discuss alternative B12 therapy given her reaction. Pt informed she will not be receiving injection today given her reaction. Also advised of Dr. Tarri Glenn and Nicoletta Ba PA-C's recommendations. Verbalized acceptance and understanding. Allergy list updated to reflect this allergic reaction. All upcoming appts canceled. Future orders for IM B12 inj D/C'd. Routing this message to pt PCP as well. ?

## 2022-03-12 NOTE — Patient Instructions (Signed)
Vitamin B12 Deficiency Vitamin B12 deficiency means that your body does not have enough vitamin B12. The body needs this important vitamin: To make red blood cells. To make genes (DNA). To help the nerves work. If you do not have enough vitamin B12 in your body, you can have health problems, such as not having enough red blood cells in the blood (anemia). What are the causes? Not eating enough foods that contain vitamin B12. Not being able to take in (absorb) vitamin B12 from the food that you eat. Certain diseases. A condition in which the body does not make enough of a certain protein. This results in your body not taking in enough vitamin B12. Having a surgery in which part of the stomach or small intestine is taken out. Taking medicines that make it hard for the body to take in vitamin B12. These include: Heartburn medicines. Some medicines that are used to treat diabetes. What increases the risk? Being an older adult. Eating a vegetarian or vegan diet that does not include any foods that come from animals. Not eating enough foods that contain vitamin B12 while you are pregnant. Taking certain medicines. Having alcoholism. What are the signs or symptoms? In some cases, there are no symptoms. If the condition leads to too few blood cells or nerve damage, symptoms can occur, such as: Feeling weak or tired. Not being hungry. Losing feeling (numbness) or tingling in your hands and feet. Redness and burning of the tongue. Feeling sad (depressed). Confusion or memory problems. Trouble walking. If anemia is very bad, symptoms can include: Being short of breath. Being dizzy. Having a very fast heartbeat. How is this treated? Changing the way you eat and drink, such as: Eating more foods that contain vitamin B12. Drinking little or no alcohol. Getting vitamin B12 shots. Taking vitamin B12 supplements by mouth (orally). Your doctor will tell you the dose that is best for you. Follow  these instructions at home: Eating and drinking  Eat foods that come from animals and have a lot of vitamin B12 in them. These include: Meats and poultry. This includes beef, pork, chicken, turkey, and organ meats, such as liver. Seafood, such as clams, rainbow trout, salmon, tuna, and haddock. Eggs. Dairy foods such as milk, yogurt, and cheese. Eat breakfast cereals that have vitamin B12 added to them (are fortified). Check the label. The items listed above may not be a complete list of foods and beverages you can eat and drink. Contact a dietitian for more information. Alcohol use Do not drink alcohol if: Your doctor tells you not to drink. You are pregnant, may be pregnant, or are planning to become pregnant. If you drink alcohol: Limit how much you have to: 0-1 drink a day for women. 0-2 drinks a day for men. Know how much alcohol is in your drink. In the [place] one drink equals one 12 oz bottle of beer (355 mL), one 5 oz glass of wine (148 mL), or one 1 oz glass of hard liquor (44 mL). General instructions Get any vitamin B12 shots if told by your doctor. Take supplements only as told by your doctor. Follow the directions. Keep all follow-up visits. Contact a doctor if: Your symptoms come back. Your symptoms get worse or do not get better with treatment. Get help right away if: You have trouble breathing. You have a very fast heartbeat. You have chest pain. You get dizzy. You faint. These symptoms may be an emergency. Get help right away. Call 911.   Do not wait to see if the symptoms will go away. Do not drive yourself to the hospital. Summary Vitamin B12 deficiency means that your body is not getting enough of the vitamin. In some cases, there are no symptoms of this condition. Treatment may include making a change in the way you eat and drink, getting shots, or taking supplements. Eat foods that have vitamin B12 in them. This information is not intended to replace advice  given to you by your health care provider. Make sure you discuss any questions you have with your health care provider. Document Revised: 07/11/2021 Document Reviewed: 07/11/2021 Elsevier Patient Education  2022 [employer] Inc.  

## 2022-03-12 NOTE — Telephone Encounter (Signed)
Appeal has been faxed to the patients insurance for expedited review from the appeal department. Sent message to patient advising of update.  ?

## 2022-03-15 NOTE — Progress Notes (Signed)
Chief Complaint  ?Patient presents with  ? Consult  ?  Wanting rx B12-was doing injections and had allergic reaction, itching.   ? ? ?Patient presents to discuss treatment of B12 deficiency. She was started on B12 shots by her GI. She reports improvement in her energy and focus.  Unfortunately, she developed a lot of itching, and cannot tolerate the injections. ?First and only injection was 4/6 (plan was to get weekly injections of 1089mg x 4).  She had severe itching which didn't respond to benadryl, on call GI (Dr. MCollene Mares sent in rx for prednisone pack. She has finished this, with no recurrence of itching. ? ?Lab Results  ?Component Value Date  ? [id number] 03/02/2022  ? ?Level was checked by GI due to 6-7 week h/o sensitivity and burning sensation of mouth/tongue. Tongue is improved, still sensitive to spicy foods. ? ?She has been on omeprazole '40mg'$  BID for over 1 year, which has worked well in controlling reflux. She was recently switched to famotidine '40mg'$  BID before meals. This isn't working quite as well as the '40mg'$  omeprazole BID.  She has some nausea, but no reflux.  She didn't tolerate trial of once daily (or none) of omeprazole. ? ?Vitamin D level was also checked, level was at the lower end of normal range, at 34.39.  She was prescribed 50,000 IU weekly x 8 weeks, and has been compliant in taking this.  She reports she hadn't been taking any vitamin D3 prior to this. ? ?Pt's BP is elevated today. She reports she gets white coat HTN.  Hasn't been monitoring at home.  She ate french fries shortly before visit. ?BP Readings from Last 3 Encounters:  ?03/16/22 (!) 148/100  ?03/02/22 (!) 140/92  ?02/26/22 122/86  ? ? ? ? ?PMH, PSH, SH reviewed ? ?Outpatient Encounter Medications as of 03/16/2022  ?Medication Sig Note  ? acetaminophen (TYLENOL) 500 MG tablet Take 1,000 mg by mouth every 6 (six) hours as needed for headache. Reported on 06/17/2016 03/16/2022: Took some last night  ? ergocalciferol (VITAMIN D2)  1.25 MG (50000 UT) capsule Take 1 capsule (50,000 Units total) by mouth once a week.   ? famotidine (PEPCID) 40 MG tablet Take 1 tablet (40 mg total) by mouth 2 (two) times daily. Before meals   ? Fexofenadine HCl (ALLEGRA PO) Take by mouth.   ? levocetirizine (XYZAL) 5 MG tablet TAKE ONE TABLET BY MOUTH EVERY EVENING **NEED OFFICE VISIT**   ? magic mouthwash (lidocaine, diphenhydrAMINE, alum & mag hydroxide) suspension Swish and spit 5 mLs 4 (four) times daily as needed for mouth pain.   ? montelukast (SINGULAIR) 10 MG tablet Take 1 tablet (10 mg total) by mouth at bedtime.   ? Spacer/Aero-Holding Chambers ([facility]) DEVI Use as directed with inhaler. Dx:  J45.40 Asthma (Patient taking differently: Use as directed with inhaler. Dx:  J45.40 Asthma) 12/04/2021: Uses with Symbicort  ? SYMBICORT 160-4.5 MCG/ACT inhaler Two puffs with spacer device twice a day during asthma flares.   ? thyroid (NP THYROID) 90 MG tablet Take 1 tablet (90 mg total) by mouth in the morning and at bedtime.   ? albuterol (VENTOLIN HFA) 108 (90 Base) MCG/ACT inhaler Inhale 2 puffs into the lungs every 4 (four) hours as needed for wheezing or shortness of breath. (Patient not taking: Reported on 03/16/2022) 12/04/2021: Uses as needed  ? beclomethasone (QVAR REDIHALER) 80 MCG/ACT inhaler Inhale 2 puffs into the lungs 2 (two) times daily. (Patient not taking: Reported on 03/02/2022)  03/16/2022: Waiting on PA  ? [DISCONTINUED] ciclesonide (ALVESCO) 80 MCG/ACT inhaler Inhale 1 puff into the lungs 2 (two) times daily.   ? [DISCONTINUED] estradiol (ESTRACE) 0.5 MG tablet Take one half tablet (0.25 mg) by mouth daily for one month.  Then take one half tablet by mouth every other day for one month, and then stop all hormones. (Patient not taking: Reported on 03/02/2022)   ? [DISCONTINUED] guaiFENesin (MUCINEX) 600 MG 12 hr tablet Take 600 mg by mouth 2 (two) times daily. (Patient not taking: Reported on 03/02/2022) 12/04/2021: Only as needed  ?  [DISCONTINUED] metFORMIN (GLUCOPHAGE-XR) 500 MG 24 hr tablet Take 2 tablets (1,000 mg total) by mouth daily with breakfast.   ? [DISCONTINUED] omeprazole (PRILOSEC) 40 MG capsule TAKE ONE CAPSULE BY MOUTH TWICE DAILY.   ? [DISCONTINUED] progesterone (PROMETRIUM) 100 MG capsule Take 1 capsule (100 mg total) by mouth at bedtime. Take one capsule at night as long as you are taking the estradiol.  You may stop the Prometrium when you stop the estradiol. (Patient not taking: Reported on 03/02/2022)   ? [DISCONTINUED] sulfamethoxazole-trimethoprim (BACTRIM DS) 800-160 MG tablet Take 1 tablet by mouth 2 (two) times daily. Take for one week.   ? ?No facility-administered encounter medications on file as of 03/16/2022.  ? ?Allergies  ?Allergen Reactions  ? B-12 [Cyanocobalamin] Anaphylaxis  ?  Generalized itching following 03/05/22 IM injection. No benefit from Benadryl. Started steroid taper  ? ? ?ROS: Denies fever, chills, URI symptoms, headaches, dizziness, shortness of breath, chest pain.  Some mild nausea, per HPI, no heartburn/reflux.  No bowel changes, bleeding, bruising, rash. Itching of skin has resolved. ?Tongue/mouth sensitivity is somewhat improved. ?See HPI ? ? ?PHYSICAL EXAM: ? ?BP (!) 148/100   Pulse 88   Ht '5\' 1"'$  (1.549 m)   Wt 187 lb 9.6 oz (85.1 kg)   LMP 01/28/2017   BMI 35.45 kg/m?  ? ?160/94 on repeat by MD ? ?Wt Readings from Last 3 Encounters:  ?03/16/22 187 lb 9.6 oz (85.1 kg)  ?03/02/22 189 lb (85.7 kg)  ?01/01/22 188 lb (85.3 kg)  ? ?Pleasant, well-appearing female in no distress ?HEENT: conjunctiva and sclera are clear, EOMI. OP--some ridges (from teeth) at lateral edges of tongue bilaterally (chronic per pt). No fissures or swelling. ?Neck: no lymphadenopathy or mass ?Heart: regular rate and rhythm ?Lungs: clear bilaterally ?Abdomen: soft, nontender ?Extremities: No edema ?Neuro: alert and oriented, cranial nerves grossly intact, normal gait. ?Psych: normal mood, affect, hygiene and  grooming ? ? ?ASSESSMENT/PLAN: ? ?Vitamin B12 deficiency - s/p '1mg'$  injection. Check CBC and B12 level. Given that she is no longer on high dose PPI, likely will do fine with oral supplementation (OTC) - Plan: CBC with Differential/Platelet, Vitamin B12 ? ?Vitamin D deficiency - level was WNL, lower end, likely didn't need 8 wks of treatment, just restarting OTC D3.  Rec 1000 IU of D3 upon completion of Rx, to continue longterm ? ?Gastroesophageal reflux disease, unspecified whether esophagitis present - Seems to be adequately controlled with famotidine BID. Discussed PPI vs H2 blocker. Cont proper diet ? ?Blood pressure elevated without history of HTN - reviewed low Na diet. To monitor elsewhere, and f/u if remains >130/80 ? ? ?I spent 36 minutes dedicated to the care of this patient, including pre-visit review of records, face to face time, post-visit ordering of testing and documentation. ? ?I'm not sure that you truly needed the prescription D replacement.  An over-the-counter dose of 1000 IU  of  D3 is enough to maintain normal levels. Once you finish the prescription, you should get back on this regimen in order to maintain normal levels throughout the year. ? ?We are checking B12 level and blood counts today to see if you even need to start on a B12 oral supplement.   Guessing that the reason it was low was related to your omeprazole, and since you are no longer taking that, you may not need to take the oral vitamin. ?If levels are on the lower side, or if there is any kind of anemia then I will recommend taking '1mg'$  (1044mg) of B12 daily.  This is found over-the-counter. ? ?Your blood pressure was high today.  It may be the white coat, but the only way to prove that is to check your blood pressure elsewhere and ensure that it is normal.  (Normal is <130/80) ?Please try and limit the sodium in your diet, and get regular exercise, in order to help keep the blood pressure down. ?Return if you have consistently  elevated blood pressure. ?Please send [facility] message with your blood pressure readings. ? ? ? ? ? ?

## 2022-03-16 ENCOUNTER — Ambulatory Visit: Payer: BC Managed Care – PPO | Admitting: Family Medicine

## 2022-03-16 ENCOUNTER — Encounter: Payer: Self-pay | Admitting: Family Medicine

## 2022-03-16 VITALS — BP 148/100 | HR 88 | Ht 61.0 in | Wt 187.6 lb

## 2022-03-16 DIAGNOSIS — E559 Vitamin D deficiency, unspecified: Secondary | ICD-10-CM

## 2022-03-16 DIAGNOSIS — R030 Elevated blood-pressure reading, without diagnosis of hypertension: Secondary | ICD-10-CM | POA: Diagnosis not present

## 2022-03-16 DIAGNOSIS — E538 Deficiency of other specified B group vitamins: Secondary | ICD-10-CM

## 2022-03-16 DIAGNOSIS — K219 Gastro-esophageal reflux disease without esophagitis: Secondary | ICD-10-CM

## 2022-03-16 NOTE — Patient Instructions (Addendum)
?  I'm not sure that you truly needed the prescription D replacement.  An over-the-counter dose of 1000 IU  of D3 is enough to maintain normal levels. Once you finish the prescription, you should get back on this regimen in order to maintain normal levels throughout the year. ? ?We are checking B12 level and blood counts today to see if you even need to start on a B12 oral supplement.   Guessing that the reason it was low was related to your omeprazole, and since you are no longer taking that, you may not need to take the oral vitamin. ?If levels are on the lower side, or if there is any kind of anemia then I will recommend taking '1mg'$  (10102mg) of B12 daily.  This is found over-the-counter. ? ?Your blood pressure was high today.  It may be the white coat, but the only way to prove that is to check your blood pressure elsewhere and ensure that it is normal.  (Normal is <130/80) ?Please try and limit the sodium in your diet, and get regular exercise, in order to help keep the blood pressure down. ?Return if you have consistently elevated blood pressure. ?Please send [facility] message with your blood pressure readings. ?

## 2022-03-16 NOTE — Telephone Encounter (Signed)
Appeal is still pending.

## 2022-03-17 LAB — CBC WITH DIFFERENTIAL/PLATELET
Basophils Absolute: 0.1 10*3/uL (ref 0.0–0.2)
Basos: 1 %
EOS (ABSOLUTE): 0.2 10*3/uL (ref 0.0–0.4)
Eos: 2 %
Hematocrit: 43.8 % (ref 34.0–46.6)
Hemoglobin: 14.6 g/dL (ref 11.1–15.9)
Immature Grans (Abs): 0 10*3/uL (ref 0.0–0.1)
Immature Granulocytes: 0 %
Lymphocytes Absolute: 4 10*3/uL — ABNORMAL HIGH (ref 0.7–3.1)
Lymphs: 36 %
MCH: 28.7 pg (ref 26.6–33.0)
MCHC: 33.3 g/dL (ref 31.5–35.7)
MCV: 86 fL (ref 79–97)
Monocytes Absolute: 1.1 10*3/uL — ABNORMAL HIGH (ref 0.1–0.9)
Monocytes: 10 %
Neutrophils Absolute: 5.7 10*3/uL (ref 1.4–7.0)
Neutrophils: 51 %
Platelets: 328 10*3/uL (ref 150–450)
RBC: 5.08 x10E6/uL (ref 3.77–5.28)
RDW: 13.1 % (ref 11.7–15.4)
WBC: 11.1 10*3/uL — ABNORMAL HIGH (ref 3.4–10.8)

## 2022-03-17 LAB — VITAMIN B12: Vitamin B-12: 584 pg/mL (ref 232–1245)

## 2022-03-18 NOTE — Telephone Encounter (Signed)
Appeal is still pending.

## 2022-03-20 ENCOUNTER — Ambulatory Visit (INDEPENDENT_AMBULATORY_CARE_PROVIDER_SITE_OTHER): Payer: BC Managed Care – PPO

## 2022-03-20 ENCOUNTER — Other Ambulatory Visit: Payer: Self-pay | Admitting: *Deleted

## 2022-03-20 ENCOUNTER — Telehealth: Payer: Self-pay | Admitting: Family Medicine

## 2022-03-20 DIAGNOSIS — J309 Allergic rhinitis, unspecified: Secondary | ICD-10-CM

## 2022-03-20 MED ORDER — LEVOCETIRIZINE DIHYDROCHLORIDE 5 MG PO TABS: ORAL_TABLET | ORAL | 3 refills | Status: DC

## 2022-03-20 MED ORDER — MONTELUKAST SODIUM 10 MG PO TABS: 10.0000 mg | ORAL_TABLET | Freq: Every day | ORAL | 3 refills | Status: DC

## 2022-03-20 MED ORDER — SYMBICORT 160-4.5 MCG/ACT IN AERO: INHALATION_SPRAY | RESPIRATORY_TRACT | 3 refills | Status: DC

## 2022-03-20 NOTE — Telephone Encounter (Signed)
Refills have been sent in to patients pharmacy to help get her through until her appointment. Called patient and advised. Patient verbalized understanding.  ?

## 2022-03-20 NOTE — Telephone Encounter (Signed)
Patient is requesting refills for levocetirizine, montelukast & singulair. Patient was told by pharmacist that she needs another visit for refills. Patient has an appointment scheduled for June 2023.  ? ?Best contact number: [phone] ? ?[place] [id number] ?

## 2022-03-27 ENCOUNTER — Ambulatory Visit (INDEPENDENT_AMBULATORY_CARE_PROVIDER_SITE_OTHER): Payer: BC Managed Care – PPO | Admitting: *Deleted

## 2022-03-27 DIAGNOSIS — J309 Allergic rhinitis, unspecified: Secondary | ICD-10-CM | POA: Diagnosis not present

## 2022-03-31 NOTE — Telephone Encounter (Signed)
Appeal was denied pt has to fail flovent hfa, and pulmicort flexhaler please advise to change ?

## 2022-03-31 NOTE — Telephone Encounter (Signed)
Oh ok. Can you please find out which duel inhalers they cover such as Symbicort, Dulera (any duel agent) thank you!!

## 2022-04-01 MED ORDER — SYMBICORT 160-4.5 MCG/ACT IN AERO: 2.0000 | INHALATION_SPRAY | Freq: Two times a day (BID) | RESPIRATORY_TRACT | 5 refills | Status: DC

## 2022-04-01 NOTE — Telephone Encounter (Signed)
Looked at formulary with anne and symbicort and advair diskus are ocvered anne wanted me to to inform pt we are sending in symbicort 160 so I have sent that in and informed pt of me doing so and she is ok with doing symbicort as she has been on it before.  ?

## 2022-04-01 NOTE — Addendum Note (Signed)
Addended by: Felipa Emory on: 04/01/2022 09:09 AM ? ? Modules accepted: Orders ? ?

## 2022-04-07 ENCOUNTER — Ambulatory Visit (INDEPENDENT_AMBULATORY_CARE_PROVIDER_SITE_OTHER): Payer: BC Managed Care – PPO

## 2022-04-07 ENCOUNTER — Ambulatory Visit: Payer: Self-pay

## 2022-04-07 DIAGNOSIS — J309 Allergic rhinitis, unspecified: Secondary | ICD-10-CM

## 2022-04-07 NOTE — Progress Notes (Signed)
error 

## 2022-04-22 ENCOUNTER — Ambulatory Visit (INDEPENDENT_AMBULATORY_CARE_PROVIDER_SITE_OTHER): Payer: BC Managed Care – PPO

## 2022-04-22 DIAGNOSIS — J309 Allergic rhinitis, unspecified: Secondary | ICD-10-CM

## 2022-04-30 ENCOUNTER — Ambulatory Visit (INDEPENDENT_AMBULATORY_CARE_PROVIDER_SITE_OTHER): Payer: BC Managed Care – PPO

## 2022-04-30 DIAGNOSIS — J309 Allergic rhinitis, unspecified: Secondary | ICD-10-CM

## 2022-05-07 ENCOUNTER — Ambulatory Visit (INDEPENDENT_AMBULATORY_CARE_PROVIDER_SITE_OTHER): Payer: BC Managed Care – PPO

## 2022-05-07 DIAGNOSIS — J309 Allergic rhinitis, unspecified: Secondary | ICD-10-CM | POA: Diagnosis not present

## 2022-05-13 ENCOUNTER — Ambulatory Visit: Payer: BC Managed Care – PPO | Admitting: Allergy

## 2022-05-13 DIAGNOSIS — J309 Allergic rhinitis, unspecified: Secondary | ICD-10-CM

## 2022-05-20 ENCOUNTER — Encounter: Payer: Self-pay | Admitting: Family Medicine

## 2022-05-20 ENCOUNTER — Ambulatory Visit: Payer: BC Managed Care – PPO | Admitting: Family Medicine

## 2022-05-20 VITALS — BP 100/60 | HR 72 | Temp 98.1°F | Ht 61.0 in | Wt 184.4 lb

## 2022-05-20 DIAGNOSIS — L720 Epidermal cyst: Secondary | ICD-10-CM | POA: Diagnosis not present

## 2022-05-20 MED ORDER — SULFAMETHOXAZOLE-TRIMETHOPRIM 800-160 MG PO TABS
1.0000 | ORAL_TABLET | Freq: Two times a day (BID) | ORAL | 0 refills | Status: DC
Start: 2022-05-20 — End: 2022-08-25

## 2022-05-20 NOTE — Patient Instructions (Signed)
Continue warm compresses to the cyst at the back of the left neck. If it increases in size, pain, redness, fever, please start the antibiotics. We are referring you to a surgeon since this has been intermittently bothersome for >1 year.

## 2022-05-20 NOTE — Progress Notes (Signed)
Chief Complaint  Patient presents with   Mass    Has a bump on the back of her neck, base of hairline. Comes and goes. Painful when she touches it. Thinks it has been there for about a year and a half. Does not ever drain.    Posterior L neck, has had a lump that comes and goes over the last year and a half. It flared up about 2-3 weeks ago. It was 3-4x the size just a few days ago, and was tender . It is decreased in size now, but still tender.  She did warm compresses.  No known drainage. She gets hot flashes and chills all the time.  No fevers, temp was up to 98.5 the other day (which is on the high side for her).  Denies other lumps, boils elsewhere on her body. She did have a boil on the underside of her L breast in late March.  She saw Dr Quincy Simmonds, was prescribed an antibiotic. She states it drained on its own, and resolved.   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  Outpatient Encounter Medications as of 05/20/2022  Medication Sig Note   acetaminophen (TYLENOL) 500 MG tablet Take 1,000 mg by mouth every 6 (six) hours as needed for headache. Reported on 06/17/2016 05/20/2022: Took 2 this am   cholecalciferol (VITAMIN D3) 25 MCG (1000 UNIT) tablet Take 1,000 Units by mouth daily.    famotidine (PEPCID) 40 MG tablet Take 1 tablet (40 mg total) by mouth 2 (two) times daily. Before meals    Fexofenadine HCl (ALLEGRA PO) Take by mouth.    levocetirizine (XYZAL) 5 MG tablet TAKE ONE TABLET BY MOUTH EVERY EVENING    montelukast (SINGULAIR) 10 MG tablet Take 1 tablet (10 mg total) by mouth at bedtime.    Spacer/Aero-Holding Chambers [facility]OND) DEVI Use as directed with inhaler. Dx:  J45.40 Asthma (Patient taking differently: Use as directed with inhaler. Dx:  J45.40 Asthma) 12/04/2021: Uses with Symbicort   SYMBICORT 160-4.5 MCG/ACT inhaler Two puffs with spacer device twice a day  during asthma flares.    thyroid (NP THYROID) 90 MG tablet Take 1 tablet (90 mg total) by mouth in the morning and at bedtime.    albuterol (VENTOLIN HFA) 108 (90 Base) MCG/ACT inhaler Inhale 2 puffs into the lungs every 4 (four) hours as needed for wheezing or shortness of breath. (Patient not taking: Reported on 03/16/2022) 12/04/2021: Uses as needed   magic mouthwash (lidocaine, diphenhydrAMINE, alum & mag hydroxide) suspension Swish and spit 5 mLs 4 (four) times daily as needed for mouth pain. (Patient not taking: Reported on 05/20/2022)    [DISCONTINUED] beclomethasone (QVAR REDIHALER) 80 MCG/ACT inhaler Inhale 2 puffs into the lungs 2 (two) times daily. (Patient not taking: Reported on 03/02/2022) 03/16/2022: Waiting on PA   [DISCONTINUED] ergocalciferol (VITAMIN D2) 1.25 MG (50000 UT) capsule Take 1 capsule (50,000 Units total) by mouth once a week.    [DISCONTINUED] metFORMIN (GLUCOPHAGE-XR) 500 MG 24 hr tablet Take 2 tablets (1,000 mg total) by mouth daily with breakfast.    [DISCONTINUED] SYMBICORT 160-4.5 MCG/ACT inhaler Inhale 2 puffs into the lungs 2 (two) times daily.    No facility-administered encounter medications on file as of 05/20/2022.   Allergies  Allergen Reactions   B-12 [Cyanocobalamin] Anaphylaxis    Generalized itching following 03/05/22 IM injection. No benefit from Benadryl. Started steroid taper  ROS:  no fever, myalgias, arthralgias, tick bites, rashes.  Starting to get a sore in her mouth--saw dentist and was prescribed something earlier today. No night sweats or significant weight changes. GI symptoms are controlled with H2 blocker. Some hot flashes and chills.   PHYSICAL EXAM:  BP 100/60   Pulse 72   Temp 98.1 F (36.7 C) (Tympanic)   Ht '5\' 1"'$  (1.549 m)   Wt 184 lb 6.4 oz (83.6 kg)   LMP 09/30/2016 Comment: aub 2021  BMI 34.84 kg/m   Wt Readings from Last 3 Encounters:  05/20/22 184 lb 6.4 oz (83.6 kg)  03/16/22 187 lb 9.6 oz (85.1 kg)  03/02/22 189 lb  (85.7 kg)   Pleasant, well-appearing female, in good spirits. HEENT: conjunctiva and sclera are clear, EOMI. Neck: no anterior lymphadenopathy or thyromegaly. Posteriorly, on the left there is a <1cm mildly tender sebaceous cyst.  There is no overlying erythema or fluctuance.  There is slight discoloration centrally, no distinct pore.  She also is noted to have tender, shotty posterior cervical LN, just laterally to this. No other cervical lymphadenopathy is noted Neuro: alert and oriented ,normal gait Psych: normal mood, affect, hygiene and grooming   ASSESSMENT/PLAN:  Epidermal inclusion cyst - Plan: Ambulatory referral to General Surgery, sulfamethoxazole-trimethoprim (BACTRIM DS) 800-160 MG tablet   EIC/sebaceous cyst.  Recurrently getting inflamed over the last few years.  Not requiring ABX, just fluctuating in size and tenderness.  Would like excision.  Refer to general surgery.  Doesn't appear to have active infection at this time, though does have some LAD. Given rx for ABX to use if flares again (increase in size, pain, redness, fever).

## 2022-05-28 ENCOUNTER — Ambulatory Visit: Payer: Self-pay

## 2022-05-28 ENCOUNTER — Ambulatory Visit: Payer: BC Managed Care – PPO | Admitting: Allergy & Immunology

## 2022-05-28 VITALS — BP 132/92 | HR 96 | Temp 98.2°F | Ht 61.0 in | Wt 184.0 lb

## 2022-05-28 DIAGNOSIS — J4540 Moderate persistent asthma, uncomplicated: Secondary | ICD-10-CM

## 2022-05-28 DIAGNOSIS — J302 Other seasonal allergic rhinitis: Secondary | ICD-10-CM

## 2022-05-28 DIAGNOSIS — E0500 Thyrotoxicosis with diffuse goiter without thyrotoxic crisis or storm: Secondary | ICD-10-CM | POA: Insufficient documentation

## 2022-05-28 DIAGNOSIS — R0683 Snoring: Secondary | ICD-10-CM | POA: Diagnosis not present

## 2022-05-28 DIAGNOSIS — J309 Allergic rhinitis, unspecified: Secondary | ICD-10-CM

## 2022-05-28 DIAGNOSIS — K219 Gastro-esophageal reflux disease without esophagitis: Secondary | ICD-10-CM

## 2022-05-28 MED ORDER — MONTELUKAST SODIUM 10 MG PO TABS: 10.0000 mg | ORAL_TABLET | Freq: Every day | ORAL | 3 refills | Status: DC

## 2022-05-28 MED ORDER — XHANCE 93 MCG/ACT NA EXHU
INHALANT_SUSPENSION | NASAL | 2 refills | Status: DC
Start: 2022-05-28 — End: 2022-06-18

## 2022-05-28 MED ORDER — QVAR REDIHALER 80 MCG/ACT IN AERB: 2.0000 | INHALATION_SPRAY | Freq: Two times a day (BID) | RESPIRATORY_TRACT | 5 refills | Status: DC

## 2022-05-28 MED ORDER — LEVOCETIRIZINE DIHYDROCHLORIDE 5 MG PO TABS: ORAL_TABLET | ORAL | 3 refills | Status: DC

## 2022-05-28 NOTE — Progress Notes (Signed)
FOLLOW UP  Date of Service/Encounter:  05/28/22   Assessment:   Mild persistent asthma, uncomplicated   Non-seasonal allergic rhinitis (trees, weeds, grasses, molds, dust mites, cat, dog and cockroach)   Adverse food reaction (shellfish/fish) - with negative testing   Asthma Reportables:  Severity: mild persistent  Risk: low Control: well controlled  Plan/Recommendations:   1. Moderate persistent asthma without complication - Lung testing looked good today. - We will work on getting the Qvar approved since the Symbicort does not seem to be making the cut.  - Previous inhalers failed: Symbicort, Alvesco  2. Seasonal and perennial allergic rhinitis - Continue with allergy shots at the same schedule.  - We are going to increase your advance of new vials so that you do not have come in five weeks in a row.  - Stop the montelukast since you did not notice a change. - Add on Xhance one spray per nostril twice daily (this can also help if there is coexisting nasal polyps).  - We can send you to ENT to evaluate for any surgical interventions that need to be done.   3. Gastroesophageal reflux disease - Continue with the use of famotidine twice daily.   4. Return in about 3 months (around 08/28/2022).   Subjective:   Jacqueline Holmes is a 51 y.o. female presenting today for follow up of  Chief Complaint  Patient presents with   Follow-up    Wants to go back to [place] has a history of the following: Patient Active Problem List   Diagnosis Date Noted   Graves disease 05/28/2022   Not well controlled moderate persistent asthma 11/07/2021   Snoring 11/07/2021   Hematuria 04/14/2021   Acute left-sided back pain 04/14/2021   Sebaceous cyst 04/14/2021   Elevated blood pressure reading in office with diagnosis of hypertension 10/22/2020   Belching 10/22/2020   Nausea 10/22/2020   Abdominal pain 10/22/2020   Gastroesophageal reflux disease 10/22/2020   Early  satiety 10/22/2020   Seasonal and perennial allergic rhinitis 08/23/2020   Genetic testing 03/05/2020   Family history of prostate cancer    Family history of pancreatic cancer    Cysts of both ovaries 02/09/2020   Allergic conjunctivitis of both eyes 10/06/2019   Elevated ALT measurement 01/28/2018   Moderate persistent asthma with acute exacerbation 01/18/2018   Allergic conjunctivitis 01/18/2018   Elevated blood-pressure reading without diagnosis of hypertension 01/18/2018   Non-seasonal allergic rhinitis due to pollen 05/03/2017   Mild persistent asthma, uncomplicated 97/67/3419   Thyroid nodule, uninodular 09/28/2011   Unspecified hypothyroidism 09/10/2011   Allergic rhinitis 09/10/2011   Asthma 04/25/2008   Contact dermatitis and other eczema due to plants (except food) 08/04/2007    History obtained from: chart review and patient.  Jacqueline Holmes is a 51 y.o. female presenting for a follow up visit.  She was last seen in December 2022.  At that time, she was started on Singulair 10 mg daily and continued on Qvar.  Her Qvar has since been changed to Symbicort.  For her allergic rhinitis, she was continued on Xyzal as well as Flonase.  She was also continued on allergen immunotherapy.  He does have a history of a lung nodule.  Reflux was controlled with omeprazole 40 mg daily.  Since the last visit, she has done well.   Asthma/Respiratory Symptom History: She was previously on Qvar. But then she went to get it approved again and the insurance  gods would not approve it. She was told that she had to show that she has tried other things first.  The Symbicort worked well for use with illnesses, but it does not work as well for every day symptoms. She thinks that she used the Symbicort three times per year. It did keep her from becoming very sick and going to the ED.   Allergic Rhinitis Symptom History: She is taking Allegra every day in the morning. She is doing levocetirizine at night. She is on  montelukast '10mg'$ .  This was added because of her snoring. She reports that her husband did not think that it helped. She is not great with nose sprays because she feels like her nose is swelling. She has a history of nasal polyps but she has never had them removed. She does not remember the last time that she saw ENT.   Jacqueline Holmes is on allergen immunotherapy. She receives two injections. Immunotherapy script #1 contains weeds, grasses, dust mites, and cat. She currently receives 0.40m of the RED vial (1/100). Immunotherapy script #2 contains molds and cockroach. She currently receives 0.584mof the RED vial (1/100). She started shots August of 2018 and reached maintenance in June of 2019. She was not continuous with her shots during that time. [place] which caused her to miss a big chunk of injections.   She and her husband have been doing a lot of traveling, including international cruises. [place]  Otherwise, there have been no changes to her past medical history, surgical history, family history, or social history.    Review of Systems  Constitutional: Negative.  Negative for chills, fever, malaise/fatigue and weight loss.  HENT:  Positive for congestion and sinus pain. Negative for ear discharge and ear pain.        Positive for snoring.   Eyes:  Negative for pain, discharge and redness.  Respiratory:  Negative for cough, sputum production, shortness of breath and wheezing.   Cardiovascular: Negative.  Negative for chest pain and palpitations.  Gastrointestinal:  Negative for abdominal pain, constipation, diarrhea, heartburn, nausea and vomiting.  Skin: Negative.  Negative for itching and rash.  Neurological:  Negative for dizziness and headaches.  Endo/Heme/Allergies:  Positive for environmental allergies. Does not bruise/bleed easily.       Objective:   Blood pressure (!) 132/92, pulse 96, temperature  98.2 F (36.8 C), temperature source Temporal, height '5\' 1"'$  (1.8.546), weight 184 lb (83.5 kg), last menstrual period 09/30/2016, SpO2 99 %. Body mass index is 34.77 kg/m.    Physical Exam Vitals reviewed.  Constitutional:      Appearance: She is well-developed.  HENT:     Head: Normocephalic and atraumatic.     Right Ear: Tympanic membrane, ear canal and external ear normal.     Left Ear: Tympanic membrane, ear canal and external ear normal.     Nose: No nasal deformity, septal deviation, mucosal edema or rhinorrhea.     Right Turbinates: Enlarged, swollen and pale.     Left Turbinates: Enlarged, swollen and pale.     Right Sinus: No maxillary sinus tenderness or frontal sinus tenderness.     Left Sinus: No maxillary sinus tenderness or frontal sinus tenderness.     Comments: Turbinates are markedly enlarged. I do not appreciate any nasal polyps.     Mouth/Throat:     Mouth: Mucous membranes are not pale and not  dry.     Pharynx: Uvula midline.  Eyes:     General: Lids are normal. No allergic shiner.       Right eye: No discharge.        Left eye: No discharge.     Conjunctiva/sclera: Conjunctivae normal.     Right eye: Right conjunctiva is not injected. No chemosis.    Left eye: Left conjunctiva is not injected. No chemosis.    Pupils: Pupils are equal, round, and reactive to light.  Cardiovascular:     Rate and Rhythm: Normal rate and regular rhythm.     Heart sounds: Normal heart sounds.  Pulmonary:     Effort: Pulmonary effort is normal. No tachypnea, accessory muscle usage or respiratory distress.     Breath sounds: Normal breath sounds. No wheezing, rhonchi or rales.     Comments: Moving air well in all lung fields. No increased work of breathing noted.  Chest:     Chest wall: No tenderness.  Lymphadenopathy:     Cervical: No cervical adenopathy.  Skin:    Coloration: Skin is not pale.     Findings: No abrasion, erythema, petechiae or rash. Rash is not papular,  urticarial or vesicular.  Neurological:     Mental Status: She is alert.  Psychiatric:        Behavior: Behavior is cooperative.      Diagnostic studies:    Spirometry: results abnormal (FEV1: 1.65/67%, FVC: 2.04/67%, FEV1/FVC: 81%).    Spirometry consistent with possible restrictive disease.   Allergy Studies: none        Salvatore Marvel, MD  [facility]

## 2022-05-28 NOTE — Patient Instructions (Addendum)
1. Moderate persistent asthma without complication - Lung testing looked good today. - We will work on getting the Qvar approved.   2. Seasonal and perennial allergic rhinitis - Continue with allergy shots at the same schedule.  - We are going to increase your advance of new vials so that you do not have come in five weeks in a row.  - Stop the montelukast since you did not notice a change. - Add on Xhance one spray per nostril twice daily (this can also help if there is coexisting nasal polyps).  - We can send you to ENT to evaluate for any surgical interventions that need to be done.   3. Gastroesophageal reflux disease - Continue with the use of famotidine twice daily.   4. Return in about 3 months (around 08/28/2022).    Please inform us of any Emergency Department visits, hospitalizations, or changes in symptoms. Call us before going to the ED for breathing or allergy symptoms since we might be able to fit you in for a sick visit. Feel free to contact us anytime with any questions, problems, or concerns.  It was a pleasure to see you again today! Don't be such a stranger!   Websites that have reliable patient information: 1. American Academy of Asthma, Allergy, and Immunology: www.aaaai.org 2. Food Allergy Research and Education (FARE): foodallergy.org 3. Mothers of Asthmatics: http://www.asthmacommunitynetwork.org 4. American College of Allergy, Asthma, and Immunology: www.acaai.org   COVID-19 Vaccine Information can be found at: [email or web address] For questions related to vaccine distribution or appointments, please email [email or web address] or call [phone].   We realize that you might be concerned about having an allergic reaction to the COVID19 vaccines. To help with that concern, WE ARE OFFERING THE COVID19 VACCINES IN OUR OFFICE! Ask the front desk for dates!     "Like" [place] on Facebook and Instagram for our  latest updates!      A healthy democracy works best when [employer] participate! Make sure you are registered to vote! If you have moved or changed any of your contact information, you will need to get this updated before voting!  In some cases, you MAY be able to register to vote online: [email or web address]

## 2022-05-31 ENCOUNTER — Encounter: Payer: Self-pay | Admitting: Allergy & Immunology

## 2022-06-02 ENCOUNTER — Encounter: Payer: Self-pay | Admitting: Family Medicine

## 2022-06-02 DIAGNOSIS — E559 Vitamin D deficiency, unspecified: Secondary | ICD-10-CM | POA: Insufficient documentation

## 2022-06-02 DIAGNOSIS — R911 Solitary pulmonary nodule: Secondary | ICD-10-CM | POA: Insufficient documentation

## 2022-06-02 DIAGNOSIS — E890 Postprocedural hypothyroidism: Secondary | ICD-10-CM | POA: Insufficient documentation

## 2022-06-02 DIAGNOSIS — E538 Deficiency of other specified B group vitamins: Secondary | ICD-10-CM | POA: Insufficient documentation

## 2022-06-02 NOTE — Patient Instructions (Incomplete)
  HEALTH MAINTENANCE RECOMMENDATIONS:  It is recommended that you get at least 30 minutes of aerobic exercise at least 5 days/week (for weight loss, you may need as much as 60-90 minutes). This can be any activity that gets your heart rate up. This can be divided in 10-15 minute intervals if needed, but try and build up your endurance at least once a week.  Weight bearing exercise is also recommended twice weekly.  Eat a healthy diet with lots of vegetables, fruits and fiber.  "Colorful" foods have a lot of vitamins (ie green vegetables, tomatoes, red peppers, etc).  Limit sweet tea, regular sodas and alcoholic beverages, all of which has a lot of calories and sugar.  Up to 1 alcoholic drink daily may be beneficial for women (unless trying to lose weight, watch sugars).  Drink a lot of water.  Calcium recommendations are 1200-1500 mg daily (1500 mg for postmenopausal women or women without ovaries), and vitamin D 1000 IU daily.  This should be obtained from diet and/or supplements (vitamins), and calcium should not be taken all at once, but in divided doses.  Monthly self breast exams and yearly mammograms for women over the age of 71 is recommended.  Sunscreen of at least SPF 30 should be used on all sun-exposed parts of the skin when outside between the hours of 10 am and 4 pm (not just when at beach or pool, but even with exercise, golf, tennis, and yard work!)  Use a sunscreen that says "broad spectrum" so it covers both UVA and UVB rays, and make sure to reapply every 1-2 hours.  Remember to change the batteries in your smoke detectors when changing your clock times in the spring and fall. Carbon monoxide detectors are recommended for your home.  Use your seat belt every time you are in a car, and please drive safely and not be distracted with cell phones and texting while driving.  I recommend getting the updated bivalent COVID booster when it becomes available in the Fall.  I recommend  getting the new shingles vaccine (Shingrix). You may want to check with your insurance to verify what your out of pocket cost may be (usually covered as preventative, but better to verify to avoid any surprises, as this vaccine is expensive), and then schedule a nurse visit at our office when convenient (based on the possible side effects as discussed).   This is a series of 2 injections, spaced 2 months apart.  It doesn't have to be exactly 2 months apart (but can't be sooner), if that isn't feasible for your schedule, but try and get them close to 2 months (and definitely within 6 months of each other, or else the efficacy of the vaccine drops off). This should be separated from other vaccines by at least 2 weeks.  Consider over-the-counter supplements such as Estroven to help with your hot flashes.  Your pain may be muscular (so try moist heat and stretches). It may also be related to diverticulosis--be sure to eat a high fiber diet. Constipation can also sometimes cause discomfort on the  left side. If your pain persists or worsens, you should follow up with Dr. Tarri Glenn.  Please call [facility] and schedule your bone density test.

## 2022-06-02 NOTE — Progress Notes (Signed)
Chief Complaint  Patient presents with   Annual Exam    Nonfasting (patient can come back tomorrow am) annual exam no pap. When she bends over and lifts her left leg she gets a pain in her abdomen. No other concerns.     Jacqueline Holmes is a 51 y.o. female who presents for a complete physical.  She sees Dr. Edward Jolly for her GYN care, last seen in 12/2021. She weaned off HRT after that visit (related to the breast tenderness--see below). She has since developed hot flashes.  Denies night sweats. She had also been having urinary frequency, and was referred for pelvic floor PT. She is getting this now.  Admits to some noncompliance with exercises, but overall is improving.  She notes some LLQ pain--this occurs intermittently, happened to recur today.  Notices it when she goes to lift her left leg, when leaning forward, such as putting on her pants. Denies nausea, vomiting, change in bowels.  Pain related to certain positions, not related to eating.  Recently seen with [facility] on posterior neck.  She was referred to [facility], given rx for ABX to have on hand if gets inflamed again. Appointment is scheduled for 7/20.  This has gotten much smaller. She had a recurrent cyst develop on the left breast--thinks in a different spot.  It recently drained, still is a little red. Didn't take the ABX prescribed at last visit.  B12 deficiency: Diagnosed by her GI (level 211 on 03/02/22), having complaints of sensitivity and burning sensation of her mouth/tongue.  Unfortunately, she had severe itching after the first injection (03/05/22). She noted improvement in energy and focus after this first shot.  She was later switched to oral supplement (1000 mcg daily), noting improvement in level from the first shot (584 on 03/16/22). We had discussed that since she was no longer on PPI, she may be able to stop the B12 after tongue symptoms resolve, if GI symptoms are adequately managed with famotidine.  If she requires PPI long-term, then oral  B12 should be continued. Tongue is much better, not perfect.  Spice still irritates the tongue (even [employer] chicken, which used to not bother her). Energy and focus remain improved (but not completely).  Lab Results  Component Value Date   VITAMINB12 584 03/16/2022    GERD/gatritis:  She had been on omeprazole 40mg  BID for over 1 year, which has worked well in controlling reflux. EGD 01/2021 showed erosive gastropathy with no bleeding and no stigmata of recent bleeding. Biopsy showed mild chronic gastritis, mild reactive gastropathy.  Negative for H.pylori.  She was then switched to famotidine 40mg  BID before meals after diagnosis of B12 deficiency. She reports doing well now, infrequent heartburn, rare nausea (seems to get after eating if she was late in eating meal).   Vitamin D level was also checked in 02/2022, level was at the lower end of normal range, at 34.39.  She completed 50,000 IU weekly x 8 weeks, and has been compliant in taking 1000 IU daily (hadn't been taking any vitamin D3 prior to that check).   Pulmonary nodule:  noted on CXR 09/2021. F/u CT in 12/2021 was stable (9mm in RUL), rec add'l f/u in 6-12 months.  She prefers to wait for 12/2022.  Post-operative hypothyroidism, h/o Graves disease, s/p thyroidectomy.  She is now under the care of Dr. Kathreen Cosier at [facility] (previously treated by [facility]). Currently she is on [place] thyroid 90 mg in the morning, 45 mg in the afternoon.  Last TSH in 10/2021 was <0.05, but Total T3 and free T4 were within the normal range. No dose changes were made.  She denies changes to hair/skin/nails/moods/weight/energy.  Asthma and allergies: On immunotherapy. Used to do Q-var all the time, and symbicort prn.  Q-var wasn't approved, so she changed to Symbicort BID. Used albuterol when air quality was poor, and when sick. Currently using Timmothy Sours, Xyzal, allegra with pretty good results.   Immunization History  Administered Date(s) Administered    Influenza Inj Mdck Quad Pf 10/29/2017   Influenza,inj,Quad PF,6+ Mos 08/30/2018, 09/14/2019, 09/18/2020   Influenza-Unspecified 09/13/2016, 08/30/2021   PFIZER(Purple Top)SARS-COV-2 Vaccination 02/02/2020, 03/01/2020, 09/18/2020   Pfizer Covid-19 Vaccine Bivalent Booster 52yrs & up 10/24/2021   Pneumococcal Polysaccharide-23 05/16/2018   Tdap 05/16/2018   Last Pap smear: 01/2020 with Dr. Edward Jolly, normal, no high risk HPV Last mammogram: 08/2021 screening, f/b L mammo/US in 11/2021 at [facility] due to nipple pain; then had bilateral breast MRI, and biopsy of R UIQ 01/2022, benign (focal fat necrosis, rare mammary duct with adjacent stromal fibrosis. No calcifications or carcinoma). Last colonoscopy: 11/2020 Dr. Orvan Falconer, showing internal hemorrhoids, sigmoid diverticulosis, 2 polyps in the ascending colon (tubular adenomas), and multiple ulcers (proximal transverse colon, hepatic flexure and in the ascending colon)--biopsy showed mucosa with patchy degenerative glands with hyalinization of lamina propria, suggestive of ischemic colitis. Felt to be due to NSAIDS, which were stopped, and pt put on PPI. Repeat colonoscopy in 7 years. Dr. Orvan Falconer stated low threshold to pursue CTA and repeat colonoscopy with recurrent abdominal pain Last DEXA: never.  Hyperthyroidism; gets prednisone 1-2x/year from allergist. Dentist: 4x/year (periodontal issues) Ophtho: yearly, but past due Exercise: limited.  Babysits, walks around with 12# granddaughter.  Lipids: Lab Results  Component Value Date   CHOL 147 05/16/2018   HDL 39 (L) 05/16/2018   LDLCALC 90 05/16/2018   TRIG 90 05/16/2018   CHOLHDL 3.8 05/16/2018    PMH, PSH, SH and FH were reviewed and updated  Outpatient Encounter Medications as of 06/03/2022  Medication Sig Note   cholecalciferol (VITAMIN D3) 25 MCG (1000 UNIT) tablet Take 1,000 Units by mouth daily.    famotidine (PEPCID) 40 MG tablet Take 1 tablet (40 mg total) by mouth 2 (two) times daily. Before  meals    Fexofenadine HCl (ALLEGRA PO) Take by mouth.    Fluticasone Propionate (XHANCE) 93 MCG/ACT EXHU Place 1 spray per nostril twice daily    levocetirizine (XYZAL) 5 MG tablet TAKE ONE TABLET BY MOUTH EVERY EVENING    nystatin cream (MYCOSTATIN) SMARTSIG:liberally Topical 3 Times Daily    Spacer/Aero-Holding Chambers (OPTICHAMBER DIAMOND) DEVI Use as directed with inhaler. Dx:  J45.40 Asthma (Patient taking differently: Use as directed with inhaler. Dx:  J45.40 Asthma) 12/04/2021: Uses with Symbicort   SYMBICORT 160-4.5 MCG/ACT inhaler Two puffs with spacer device twice a day during asthma flares.    thyroid (NP THYROID) 90 MG tablet Take 1 tablet (90 mg total) by mouth in the morning and at bedtime. 06/03/2022: Takes 1 in the morning, 1/2 tablet in the afternoon   acetaminophen (TYLENOL) 500 MG tablet Take 1,000 mg by mouth every 6 (six) hours as needed for headache. Reported on 06/17/2016 (Patient not taking: Reported on 06/03/2022) 06/03/2022: prn   albuterol (VENTOLIN HFA) 108 (90 Base) MCG/ACT inhaler Inhale 2 puffs into the lungs every 4 (four) hours as needed for wheezing or shortness of breath. (Patient not taking: Reported on 06/03/2022) 06/03/2022: prn   beclomethasone (QVAR REDIHALER) 80 MCG/ACT  inhaler Inhale 2 puffs into the lungs 2 (two) times daily. (Patient not taking: Reported on 06/03/2022) 06/03/2022: Not using due to insurance   DENTA 5000 PLUS 1.1 % CREA dental cream Take by mouth 2 (two) times daily. (Patient not taking: Reported on 06/03/2022) 06/03/2022: Has not started using yet   magic mouthwash (lidocaine, diphenhydrAMINE, alum & mag hydroxide) suspension Swish and spit 5 mLs 4 (four) times daily as needed for mouth pain. (Patient not taking: Reported on 05/20/2022) 06/03/2022: prn   sulfamethoxazole-trimethoprim (BACTRIM DS) 800-160 MG tablet Take 1 tablet by mouth 2 (two) times daily. (Patient not taking: Reported on 05/28/2022) 06/03/2022: Has not filled   [DISCONTINUED] levocetirizine (XYZAL)  5 MG tablet TAKE ONE TABLET BY MOUTH EVERY EVENING    [DISCONTINUED] metFORMIN (GLUCOPHAGE-XR) 500 MG 24 hr tablet Take 2 tablets (1,000 mg total) by mouth daily with breakfast.    [DISCONTINUED] montelukast (SINGULAIR) 10 MG tablet Take 1 tablet (10 mg total) by mouth at bedtime.    [DISCONTINUED] montelukast (SINGULAIR) 10 MG tablet Take 1 tablet (10 mg total) by mouth at bedtime.    No facility-administered encounter medications on file as of 06/03/2022.   Allergies  Allergen Reactions   B-12 [Cyanocobalamin] Anaphylaxis    Generalized itching following 03/05/22 IM injection. No benefit from Benadryl. Started steroid taper    ROS:  The patient denies anorexia, fever, weight changes, headaches,  vision changes, decreased hearing, ear pain, sore throat, breast concerns, chest pain, palpitations, dizziness, syncope, dyspnea on exertion, cough, swelling, nausea, vomiting, diarrhea, constipation, melena, hematochezia, indigestion/heartburn, hematuria, incontinence, dysuria, vaginal bleeding, discharge, odor or itch, genital lesions, joint pains, numbness, tingling, weakness, tremor, suspicious skin lesions, depression, anxiety, abnormal bleeding/bruising, or enlarged lymph nodes.  Still has some intermittent nipple pain on the left. Nausea, reflux improved, per HPI Urinary frequency improving with PT Hot flashes since stopping HRT, no night sweats. Rare bowel urgency, mainly after drinking coffee.  Denies abdominal pain, other than the L-sided pain she noted today with certain leg movements. See HPI. Frequent HA's (sinus, tension), often at left posterior/lateral neck    PHYSICAL EXAM:  BP 140/80   Pulse 72   Ht 5' 1.5" (1.562 m)   Wt 184 lb (83.5 kg)   LMP 09/30/2016 Comment: aub 2021  BMI 34.20 kg/m   BP not repeated, as she was tachycardic (around 100) during exam by MD, clearly with white coat component.  BP Readings from Last 3 Encounters:  06/03/22 140/80  05/28/22 (!) 132/92   05/20/22 100/60    Wt Readings from Last 3 Encounters:  06/03/22 184 lb (83.5 kg)  05/28/22 184 lb (83.5 kg)  05/20/22 184 lb 6.4 oz (83.6 kg)    General Appearance:    Alert, cooperative, no distress, appears stated age  Head:    Normocephalic, without obvious abnormality, atraumatic  Eyes:    PERRL, conjunctiva/corneas clear, EOM's intact, fundi    benign  Ears:    Normal TM's and external ear canals  Nose:   Nares normal, mucosa normal, no drainage or sinus   tenderness  Throat:   Lips, mucosa, and tongue normal; teeth and gums normal  Neck:   Supple, no lymphadenopathy;  thyroid:  no enlargement/ tenderness/nodules; no carotid bruit or JVD. Minimal residual cyst noted at L posterior neck.  Back:    Spine nontender, no curvature, ROM normal, no CVA     tenderness  Lungs:     Clear to auscultation bilaterally without wheezes, rales or  ronchi; respirations unlabored  Chest Wall:    No tenderness or deformity. There is a small area of residual mild erythema at 5 o'clock position of inferior L breast.  There is no drainage, fluctuance, or any significant induration.  Recent cyst that drained appears to be healing, minimal residual.   Heart:    Mildly tachycardic (rate 100) during MD exam, S1 and S2 normal, no murmur, rub or gallop  Breast Exam:    Deferred to GYN. WHSS noted bilaterally  Abdomen:     Soft, nondistended, normoactive bowel sounds, no masses, no hepatosplenomegaly. She has mild epigastric tenderness, and also LLQ tenderness. No rebound or guarding.  Genitalia:    Deferred to GYN     Extremities:   No clubbing, cyanosis or edema  Pulses:   2+ and symmetric all extremities  Skin:   Skin color, texture, turgor normal, no rashes or lesions. Many tattoos (bilateral ankles, L forearm, back)  Lymph nodes:   Cervical, supraclavicular, and axillary nodes normal  Neurologic:   CNII-XII intact, normal strength, sensation and gait; reflexes 2+ and symmetric throughout           Psych:   Normal mood, affect, hygiene and grooming.     ASSESSMENT/PLAN:  Annual physical exam - Plan: POCT Urinalysis DIP (Proadvantage Device), Lipid panel, Comprehensive metabolic panel, CBC with Differential/Platelet, Vitamin B12, HIV Antibody (routine testing w rflx), CANCELED: Vitamin B12, CANCELED: HIV Antibody (routine testing w rflx), CANCELED: Comprehensive metabolic panel, CANCELED: Lipid panel, CANCELED: CBC with Differential/Platelet  Vitamin B12 deficiency - continue B12 supplement - Plan: Vitamin B12, CANCELED: Vitamin B12  Vitamin D deficiency - continue daily D supplement  Gastroesophageal reflux disease, unspecified whether esophagitis present - cont famotidine.  Mild epigastric TTP.  f/u with GI if any worsening GI sx  Postoperative hypothyroidism - managed by Dr. Kathreen Cosier  Seasonal and perennial allergic rhinitis - on immunotherapy  Mild persistent asthma, uncomplicated - under care of allergist  Pulmonary nodule, right - RUL; due for recheck 6-12 mos, pt prefers to wait for 1 year (12/2022)  History of Graves' disease - Plan: DG Bone Density  At high risk for osteoporosis - Plan: DG Bone Density  Postmenopausal estrogen deficiency - Plan: DG Bone Density  Left lower quadrant abdominal tenderness without rebound tenderness - pt with known diverticulosis. Rec high fiber diet.  Seems MSK (triggered by certain leg movements), warm heat. f/u with GI if worsening  Epidermal inclusion cyst - at L posterior neck and L inferior breast, both of which are almost completely resolved. Scheduled to have the one on neck removed  Reviewed findings from recent breast imaging, last year's colonoscopy/EGD/pathology. Doesn't seem to be having symptoms of ischemic colitis.  Discussed these, and to f/u with GI if they develop. Current discomfort may be MSK, vs related to constipation or diverticulosis.  High fiber diet recommended. Reviewed s/sx diverticulitis, and to seek care if they  develop.  Discussed monthly self breast exams and yearly mammograms; at least 30 minutes of aerobic activity at least 5 days/week, weight-bearing exercise at least 2x/week; proper sunscreen use reviewed; healthy diet, including goals of calcium and vitamin D intake and alcohol recommendations (less than or equal to 1 drink/day) reviewed; regular seatbelt use; changing batteries in smoke detectors.  Immunization recommendations discussed--continue yearly flu shots.  Updated COVID booster recommended when available in the Fall.  Shingrix recommended--risks/SE reviewed.  To schedule NV when convenient (babysitting granddaughter tomorrow, declined today). Colonoscopy recommendations reviewed, UTD.  F/u 1  year, sooner prn.  Total FTF time 60 mins, plus additional 20 mins chart review, documentation.

## 2022-06-03 ENCOUNTER — Encounter: Payer: Self-pay | Admitting: Family Medicine

## 2022-06-03 ENCOUNTER — Ambulatory Visit (INDEPENDENT_AMBULATORY_CARE_PROVIDER_SITE_OTHER): Payer: BC Managed Care – PPO | Admitting: Family Medicine

## 2022-06-03 ENCOUNTER — Ambulatory Visit (INDEPENDENT_AMBULATORY_CARE_PROVIDER_SITE_OTHER): Payer: BC Managed Care – PPO

## 2022-06-03 VITALS — BP 140/80 | HR 72 | Ht 61.5 in | Wt 184.0 lb

## 2022-06-03 DIAGNOSIS — Z0000 Encounter for general adult medical examination without abnormal findings: Secondary | ICD-10-CM | POA: Diagnosis not present

## 2022-06-03 DIAGNOSIS — L720 Epidermal cyst: Secondary | ICD-10-CM

## 2022-06-03 DIAGNOSIS — J4530 Mild persistent asthma, uncomplicated: Secondary | ICD-10-CM

## 2022-06-03 DIAGNOSIS — K219 Gastro-esophageal reflux disease without esophagitis: Secondary | ICD-10-CM | POA: Diagnosis not present

## 2022-06-03 DIAGNOSIS — E538 Deficiency of other specified B group vitamins: Secondary | ICD-10-CM | POA: Diagnosis not present

## 2022-06-03 DIAGNOSIS — J309 Allergic rhinitis, unspecified: Secondary | ICD-10-CM | POA: Diagnosis not present

## 2022-06-03 DIAGNOSIS — R10814 Left lower quadrant abdominal tenderness: Secondary | ICD-10-CM

## 2022-06-03 DIAGNOSIS — E890 Postprocedural hypothyroidism: Secondary | ICD-10-CM | POA: Diagnosis not present

## 2022-06-03 DIAGNOSIS — J3089 Other allergic rhinitis: Secondary | ICD-10-CM

## 2022-06-03 DIAGNOSIS — Z780 Asymptomatic menopausal state: Secondary | ICD-10-CM

## 2022-06-03 DIAGNOSIS — J302 Other seasonal allergic rhinitis: Secondary | ICD-10-CM

## 2022-06-03 DIAGNOSIS — E559 Vitamin D deficiency, unspecified: Secondary | ICD-10-CM | POA: Diagnosis not present

## 2022-06-03 DIAGNOSIS — Z8639 Personal history of other endocrine, nutritional and metabolic disease: Secondary | ICD-10-CM

## 2022-06-03 DIAGNOSIS — Z9189 Other specified personal risk factors, not elsewhere classified: Secondary | ICD-10-CM | POA: Diagnosis not present

## 2022-06-03 DIAGNOSIS — R911 Solitary pulmonary nodule: Secondary | ICD-10-CM

## 2022-06-03 LAB — POCT URINALYSIS DIP (PROADVANTAGE DEVICE)
Bilirubin, UA: NEGATIVE
Blood, UA: NEGATIVE
Glucose, UA: NEGATIVE mg/dL
Ketones, POC UA: NEGATIVE mg/dL
Leukocytes, UA: NEGATIVE
Nitrite, UA: NEGATIVE
Protein Ur, POC: NEGATIVE mg/dL
Specific Gravity, Urine: 1.025
Urobilinogen, Ur: NEGATIVE
pH, UA: 6 (ref 5.0–8.0)

## 2022-06-03 NOTE — Progress Notes (Signed)
DO NOT MAKE UNTIL NEEDED.

## 2022-06-03 NOTE — Progress Notes (Signed)
Aeroallergen Immunotherapy (**NOTE NEW SCRIPT**)   Ordering Provider: Dr. Salvatore Marvel   Patient Details  Name: Jacqueline Holmes  MRN: [id number]  Date of Birth: 08-19-1971   Order 1 of 2   Vial Label: G/W/T/DM/C   Order 1 of 2   Vial Label: Grass/Weed/Tree/DM/Cat   0.4 ml (Volume)  BAU Concentration -- 7 Grass Mix* 100,000 ([address] [place], [place], [place], [place], [place], Sweet Vernal, [place])  0.3 ml (Volume)  BAU Concentration -- [place] 10,000  0.2 ml (Volume)  1:20 Concentration -- [place]  0.3 ml (Volume)  1:20 Concentration -- Ragweed Mix  0.6 ml (Volume)  1:20 Concentration -- Eastern 10 Tree Mix (also Sweet Gum)  0.3 ml (Volume)  1:20 Concentration -- Box Elder  0.3 ml (Volume)  1:10 Concentration -- Cedar, red  0.3 ml (Volume)  1:10 Concentration -- Pecan Pollen  0.3 ml (Volume)  1:10 Concentration -- Pine Mix  0.2 ml (Volume)  1:20 Concentration -- Walnut, Black Pollen  0.5 ml (Volume)  1:10 Concentration -- Cat Hair  0.5 ml (Volume)  1:10 Concentration -- Dog Epithelia  0.5 ml (Volume)   AU Concentration -- Mite Mix (DF 5,000 & DP 5,000)   4.7  ml Extract Subtotal  0.3  ml Diluent  5.0  ml Maintenance Total   Schedule:  C   Special Instructions: Do not mix until a new vial is needed. Advance the first vial on Schedule A and then transition to Schedule C.

## 2022-06-04 ENCOUNTER — Telehealth: Payer: Self-pay

## 2022-06-04 NOTE — Telephone Encounter (Signed)
Received fax for a refill for [place] from [facility]. Called patient to see if she really needed this refill. Patient states that she never got the first one, that the pharmacy never called and, she has not had the time to call [facility] to set up the delivery. She states that she will call the pharmacy tomorrow.

## 2022-06-18 ENCOUNTER — Ambulatory Visit (INDEPENDENT_AMBULATORY_CARE_PROVIDER_SITE_OTHER): Payer: BC Managed Care – PPO

## 2022-06-18 DIAGNOSIS — J309 Allergic rhinitis, unspecified: Secondary | ICD-10-CM | POA: Diagnosis not present

## 2022-06-18 MED ORDER — XHANCE 93 MCG/ACT NA EXHU: INHALANT_SUSPENSION | NASAL | 5 refills | Status: DC

## 2022-06-18 NOTE — Telephone Encounter (Signed)
Called [facility] and confirmed the reason for needed a new prescription. Sharyn Lull, the person I spoke with, stated that [facility] sent the prescription to one of their sister pharmacies but that pharmacy wasn't able to fill it nor send the script back to [facility]. Prescription is being resent to [facility] per their request. They will follow up with the patient.

## 2022-06-18 NOTE — Addendum Note (Signed)
Addended by: Eloy End D on: 06/18/2022 11:48 AM   Modules accepted: Orders

## 2022-06-18 NOTE — Telephone Encounter (Signed)
Patient states she spoke to [facility] and was told our office needs to send in prescription again.   Please advise.

## 2022-06-23 ENCOUNTER — Ambulatory Visit (INDEPENDENT_AMBULATORY_CARE_PROVIDER_SITE_OTHER): Payer: BC Managed Care – PPO

## 2022-06-23 DIAGNOSIS — J309 Allergic rhinitis, unspecified: Secondary | ICD-10-CM

## 2022-07-03 ENCOUNTER — Other Ambulatory Visit: Payer: Self-pay | Admitting: Physician Assistant

## 2022-07-08 ENCOUNTER — Ambulatory Visit (INDEPENDENT_AMBULATORY_CARE_PROVIDER_SITE_OTHER): Payer: BC Managed Care – PPO

## 2022-07-08 DIAGNOSIS — J309 Allergic rhinitis, unspecified: Secondary | ICD-10-CM | POA: Diagnosis not present

## 2022-07-14 ENCOUNTER — Ambulatory Visit
Admission: RE | Admit: 2022-07-14 | Discharge: 2022-07-14 | Disposition: A | Discharge status: Home / Self Care | Payer: BC Managed Care – PPO | Source: Ambulatory Visit | Attending: Family Medicine | Admitting: Family Medicine

## 2022-07-14 DIAGNOSIS — Z8639 Personal history of other endocrine, nutritional and metabolic disease: Secondary | ICD-10-CM

## 2022-07-14 DIAGNOSIS — Z9189 Other specified personal risk factors, not elsewhere classified: Secondary | ICD-10-CM

## 2022-07-14 DIAGNOSIS — Z780 Asymptomatic menopausal state: Secondary | ICD-10-CM

## 2022-07-24 ENCOUNTER — Ambulatory Visit (INDEPENDENT_AMBULATORY_CARE_PROVIDER_SITE_OTHER): Payer: BC Managed Care – PPO | Admitting: *Deleted

## 2022-07-24 DIAGNOSIS — J309 Allergic rhinitis, unspecified: Secondary | ICD-10-CM | POA: Diagnosis not present

## 2022-07-30 ENCOUNTER — Ambulatory Visit (INDEPENDENT_AMBULATORY_CARE_PROVIDER_SITE_OTHER): Payer: BC Managed Care – PPO

## 2022-07-30 DIAGNOSIS — J309 Allergic rhinitis, unspecified: Secondary | ICD-10-CM | POA: Diagnosis not present

## 2022-08-04 ENCOUNTER — Other Ambulatory Visit: Payer: Self-pay | Admitting: Obstetrics and Gynecology

## 2022-08-04 DIAGNOSIS — Z1239 Encounter for other screening for malignant neoplasm of breast: Secondary | ICD-10-CM

## 2022-08-05 ENCOUNTER — Encounter: Payer: Self-pay | Admitting: Internal Medicine

## 2022-08-05 ENCOUNTER — Ambulatory Visit (INDEPENDENT_AMBULATORY_CARE_PROVIDER_SITE_OTHER): Payer: BC Managed Care – PPO | Admitting: *Deleted

## 2022-08-05 DIAGNOSIS — J309 Allergic rhinitis, unspecified: Secondary | ICD-10-CM

## 2022-08-07 ENCOUNTER — Telehealth: Payer: Self-pay | Admitting: Family Medicine

## 2022-08-07 NOTE — Telephone Encounter (Signed)
Pt called in and wanted to come in for some blood work. I didn't see any orders put in. Is this ok? I will call her back and get her scheduled if so.

## 2022-08-07 NOTE — Telephone Encounter (Signed)
Future orders were placed at her physical appointment in July.  You should be able to see these under labs (under chart review). Please schedule her for a fasting lab visit.

## 2022-08-11 ENCOUNTER — Other Ambulatory Visit: Payer: BC Managed Care – PPO

## 2022-08-12 ENCOUNTER — Ambulatory Visit (INDEPENDENT_AMBULATORY_CARE_PROVIDER_SITE_OTHER): Payer: BC Managed Care – PPO | Admitting: *Deleted

## 2022-08-12 ENCOUNTER — Other Ambulatory Visit (INDEPENDENT_AMBULATORY_CARE_PROVIDER_SITE_OTHER): Payer: BC Managed Care – PPO

## 2022-08-12 DIAGNOSIS — Z0000 Encounter for general adult medical examination without abnormal findings: Secondary | ICD-10-CM

## 2022-08-12 DIAGNOSIS — J309 Allergic rhinitis, unspecified: Secondary | ICD-10-CM | POA: Diagnosis not present

## 2022-08-12 DIAGNOSIS — E538 Deficiency of other specified B group vitamins: Secondary | ICD-10-CM

## 2022-08-13 LAB — COMPREHENSIVE METABOLIC PANEL
ALT: 36 IU/L — ABNORMAL HIGH (ref 0–32)
AST: 16 IU/L (ref 0–40)
Albumin/Globulin Ratio: 1.4 (ref 1.2–2.2)
Albumin: 4.4 g/dL (ref 3.8–4.9)
Alkaline Phosphatase: 83 IU/L (ref 44–121)
BUN/Creatinine Ratio: 23 (ref 9–23)
BUN: 19 mg/dL (ref 6–24)
Bilirubin Total: 0.3 mg/dL (ref 0.0–1.2)
CO2: 23 mmol/L (ref 20–29)
Calcium: 9.5 mg/dL (ref 8.7–10.2)
Chloride: 101 mmol/L (ref 96–106)
Creatinine, Ser: 0.84 mg/dL (ref 0.57–1.00)
Globulin, Total: 3.1 g/dL (ref 1.5–4.5)
Glucose: 111 mg/dL — ABNORMAL HIGH (ref 70–99)
Potassium: 4.9 mmol/L (ref 3.5–5.2)
Sodium: 140 mmol/L (ref 134–144)
Total Protein: 7.5 g/dL (ref 6.0–8.5)
eGFR: 84 mL/min/{1.73_m2} (ref >59)

## 2022-08-13 LAB — CBC WITH DIFFERENTIAL/PLATELET
Basophils Absolute: 0 10*3/uL (ref 0.0–0.2)
Basos: 1 %
EOS (ABSOLUTE): 0.2 10*3/uL (ref 0.0–0.4)
Eos: 2 %
Hematocrit: 46.8 % — ABNORMAL HIGH (ref 34.0–46.6)
Hemoglobin: 15 g/dL (ref 11.1–15.9)
Immature Grans (Abs): 0 10*3/uL (ref 0.0–0.1)
Immature Granulocytes: 0 %
Lymphocytes Absolute: 3.2 10*3/uL — ABNORMAL HIGH (ref 0.7–3.1)
Lymphs: 39 %
MCH: 28.6 pg (ref 26.6–33.0)
MCHC: 32.1 g/dL (ref 31.5–35.7)
MCV: 89 fL (ref 79–97)
Monocytes Absolute: 0.5 10*3/uL (ref 0.1–0.9)
Monocytes: 6 %
Neutrophils Absolute: 4.2 10*3/uL (ref 1.4–7.0)
Neutrophils: 52 %
Platelets: 308 10*3/uL (ref 150–450)
RBC: 5.25 x10E6/uL (ref 3.77–5.28)
RDW: 13.1 % (ref 11.7–15.4)
WBC: 8 10*3/uL (ref 3.4–10.8)

## 2022-08-13 LAB — LIPID PANEL
Chol/HDL Ratio: 3.9 ratio (ref 0.0–4.4)
Cholesterol, Total: 212 mg/dL — ABNORMAL HIGH (ref 100–199)
HDL: 54 mg/dL (ref >39)
LDL Chol Calc (NIH): 125 mg/dL — ABNORMAL HIGH (ref 0–99)
Triglycerides: 186 mg/dL — ABNORMAL HIGH (ref 0–149)
VLDL Cholesterol Cal: 33 mg/dL (ref 5–40)

## 2022-08-13 LAB — HIV ANTIBODY (ROUTINE TESTING W REFLEX): HIV Screen 4th Generation wRfx: NONREACTIVE

## 2022-08-13 LAB — VITAMIN B12: Vitamin B-12: 1870 pg/mL — ABNORMAL HIGH (ref 232–1245)

## 2022-08-14 ENCOUNTER — Encounter (HOSPITAL_BASED_OUTPATIENT_CLINIC_OR_DEPARTMENT_OTHER): Payer: Self-pay | Admitting: Surgery

## 2022-08-14 ENCOUNTER — Other Ambulatory Visit: Payer: Self-pay

## 2022-08-17 DIAGNOSIS — J3089 Other allergic rhinitis: Secondary | ICD-10-CM | POA: Diagnosis not present

## 2022-08-18 ENCOUNTER — Ambulatory Visit
Admission: RE | Admit: 2022-08-18 | Discharge: 2022-08-18 | Disposition: A | Discharge status: Home / Self Care | Payer: BC Managed Care – PPO | Source: Ambulatory Visit | Attending: Obstetrics and Gynecology | Admitting: Obstetrics and Gynecology

## 2022-08-18 DIAGNOSIS — Z1239 Encounter for other screening for malignant neoplasm of breast: Secondary | ICD-10-CM

## 2022-08-18 MED ORDER — GADOBUTROL 1 MMOL/ML IV SOLN
9.0000 mL | Freq: Once | INTRAVENOUS | Status: AC | PRN
  Administered 2022-08-18: 9 mL via INTRAVENOUS

## 2022-08-18 NOTE — Progress Notes (Signed)
VIALS EXP 08-19-23. G-W-T-DM-CAT IS NEW RX.

## 2022-08-20 ENCOUNTER — Ambulatory Visit: Payer: Self-pay | Admitting: Surgery

## 2022-08-24 NOTE — Anesthesia Preprocedure Evaluation (Signed)
Anesthesia Evaluation  Patient identified by MRN, date of birth, ID band Patient awake    Reviewed: Allergy & Precautions, NPO status , Patient's Chart, lab work & pertinent test results  History of Anesthesia Complications Negative for: history of anesthetic complications  Airway Mallampati: III  TM Distance: >3 FB Neck ROM: Full    Dental no notable dental hx. (+) Loose,    Pulmonary asthma , sleep apnea ,    Pulmonary exam normal        Cardiovascular hypertension, Normal cardiovascular exam     Neuro/Psych  Headaches,    GI/Hepatic Neg liver ROS, GERD  Controlled,  Endo/Other  Hypothyroidism   Renal/GU negative Renal ROS  negative genitourinary   Musculoskeletal negative musculoskeletal ROS (+)   Abdominal   Peds  Hematology negative hematology ROS (+)   Anesthesia Other Findings Day of surgery medications reviewed with patient.  Reproductive/Obstetrics negative OB ROS                            Anesthesia Physical Anesthesia Plan  ASA: 2  Anesthesia Plan: General   Post-op Pain Management: Tylenol PO (pre-op)*   Induction: Intravenous  PONV Risk Score and Plan: 3 and Treatment may vary due to age or medical condition, Midazolam, Scopolamine patch - Pre-op, Dexamethasone and Ondansetron  Airway Management Planned: Oral ETT  Additional Equipment: None  Intra-op Plan:   Post-operative Plan: Extubation in OR  Informed Consent: I have reviewed the patients History and Physical, chart, labs and discussed the procedure including the risks, benefits and alternatives for the proposed anesthesia with the patient or authorized representative who has indicated his/her understanding and acceptance.     Dental advisory given  Plan Discussed with: CRNA  Anesthesia Plan Comments:        Anesthesia Quick Evaluation

## 2022-08-25 ENCOUNTER — Ambulatory Visit (HOSPITAL_BASED_OUTPATIENT_CLINIC_OR_DEPARTMENT_OTHER): Payer: BC Managed Care – PPO | Admitting: Certified Registered"

## 2022-08-25 ENCOUNTER — Ambulatory Visit (HOSPITAL_BASED_OUTPATIENT_CLINIC_OR_DEPARTMENT_OTHER)
Admission: RE | Admit: 2022-08-25 | Discharge: 2022-08-25 | Disposition: A | Discharge status: Home / Self Care | Payer: BC Managed Care – PPO | Attending: Surgery | Admitting: Surgery

## 2022-08-25 ENCOUNTER — Other Ambulatory Visit: Payer: Self-pay

## 2022-08-25 ENCOUNTER — Encounter (HOSPITAL_BASED_OUTPATIENT_CLINIC_OR_DEPARTMENT_OTHER): Payer: Self-pay | Admitting: Surgery

## 2022-08-25 ENCOUNTER — Encounter (HOSPITAL_BASED_OUTPATIENT_CLINIC_OR_DEPARTMENT_OTHER)
Admission: RE | Disposition: A | Discharge status: Home / Self Care | Payer: Self-pay | Source: Home / Self Care | Attending: Surgery

## 2022-08-25 DIAGNOSIS — K219 Gastro-esophageal reflux disease without esophagitis: Secondary | ICD-10-CM | POA: Insufficient documentation

## 2022-08-25 DIAGNOSIS — G4730 Sleep apnea, unspecified: Secondary | ICD-10-CM | POA: Diagnosis not present

## 2022-08-25 DIAGNOSIS — J45909 Unspecified asthma, uncomplicated: Secondary | ICD-10-CM | POA: Diagnosis not present

## 2022-08-25 DIAGNOSIS — I10 Essential (primary) hypertension: Secondary | ICD-10-CM | POA: Insufficient documentation

## 2022-08-25 DIAGNOSIS — R221 Localized swelling, mass and lump, neck: Secondary | ICD-10-CM | POA: Diagnosis present

## 2022-08-25 DIAGNOSIS — L720 Epidermal cyst: Secondary | ICD-10-CM | POA: Diagnosis not present

## 2022-08-25 DIAGNOSIS — Z01818 Encounter for other preprocedural examination: Secondary | ICD-10-CM

## 2022-08-25 DIAGNOSIS — E890 Postprocedural hypothyroidism: Secondary | ICD-10-CM | POA: Diagnosis not present

## 2022-08-25 HISTORY — PX: MASS EXCISION: SHX2000

## 2022-08-25 SURGERY — EXCISION MASS
Anesthesia: General | Site: Neck | Laterality: Left

## 2022-08-25 MED ORDER — PROPOFOL 500 MG/50ML IV EMUL
INTRAVENOUS | Status: AC
  Filled 2022-08-25: qty 150

## 2022-08-25 MED ORDER — SUCCINYLCHOLINE CHLORIDE 200 MG/10ML IV SOSY
PREFILLED_SYRINGE | INTRAVENOUS | Status: AC
  Filled 2022-08-25: qty 10

## 2022-08-25 MED ORDER — AMISULPRIDE (ANTIEMETIC) 5 MG/2ML IV SOLN: 10.0000 mg | Freq: Once | INTRAVENOUS | Status: DC | PRN

## 2022-08-25 MED ORDER — LIDOCAINE-EPINEPHRINE 1 %-1:100000 IJ SOLN
INTRAMUSCULAR | Status: AC
  Filled 2022-08-25: qty 1

## 2022-08-25 MED ORDER — DEXAMETHASONE SODIUM PHOSPHATE 10 MG/ML IJ SOLN
INTRAMUSCULAR | Status: AC
  Filled 2022-08-25: qty 2

## 2022-08-25 MED ORDER — LIDOCAINE-EPINEPHRINE (PF) 1 %-1:200000 IJ SOLN
INTRAMUSCULAR | Status: AC
  Filled 2022-08-25: qty 30

## 2022-08-25 MED ORDER — HEPARIN SODIUM (PORCINE) 5000 UNIT/ML IJ SOLN
INTRAMUSCULAR | Status: AC
  Filled 2022-08-25: qty 1

## 2022-08-25 MED ORDER — ACETAMINOPHEN 500 MG PO TABS
ORAL_TABLET | ORAL | Status: AC
  Filled 2022-08-25: qty 1

## 2022-08-25 MED ORDER — ROCURONIUM BROMIDE 100 MG/10ML IV SOLN
INTRAVENOUS | Status: DC | PRN
  Administered 2022-08-25: 60 mg via INTRAVENOUS

## 2022-08-25 MED ORDER — ROCURONIUM BROMIDE 10 MG/ML (PF) SYRINGE
PREFILLED_SYRINGE | INTRAVENOUS | Status: AC
  Filled 2022-08-25: qty 10

## 2022-08-25 MED ORDER — ONDANSETRON HCL 4 MG/2ML IJ SOLN
INTRAMUSCULAR | Status: AC
  Filled 2022-08-25: qty 14

## 2022-08-25 MED ORDER — CHLORHEXIDINE GLUCONATE CLOTH 2 % EX PADS: 6.0000 | MEDICATED_PAD | Freq: Once | CUTANEOUS | Status: DC

## 2022-08-25 MED ORDER — LIDOCAINE-EPINEPHRINE (PF) 1 %-1:200000 IJ SOLN
INTRAMUSCULAR | Status: DC | PRN
  Administered 2022-08-25: 20 mL

## 2022-08-25 MED ORDER — ACETAMINOPHEN 500 MG PO TABS: 1000.0000 mg | ORAL_TABLET | Freq: Four times a day (QID) | ORAL | 3 refills | Status: DC

## 2022-08-25 MED ORDER — SUGAMMADEX SODIUM 500 MG/5ML IV SOLN
INTRAVENOUS | Status: AC
  Filled 2022-08-25: qty 5

## 2022-08-25 MED ORDER — FENTANYL CITRATE (PF) 100 MCG/2ML IJ SOLN
INTRAMUSCULAR | Status: AC
  Filled 2022-08-25: qty 2

## 2022-08-25 MED ORDER — OXYMETAZOLINE HCL 0.05 % NA SOLN
NASAL | Status: AC
  Filled 2022-08-25: qty 30

## 2022-08-25 MED ORDER — METHOCARBAMOL 750 MG PO TABS: 750.0000 mg | ORAL_TABLET | Freq: Four times a day (QID) | ORAL | 1 refills | Status: DC

## 2022-08-25 MED ORDER — FENTANYL CITRATE (PF) 100 MCG/2ML IJ SOLN: 25.0000 ug | INTRAMUSCULAR | Status: DC | PRN

## 2022-08-25 MED ORDER — SUGAMMADEX SODIUM 200 MG/2ML IV SOLN
INTRAVENOUS | Status: DC | PRN
  Administered 2022-08-25: 200 mg via INTRAVENOUS

## 2022-08-25 MED ORDER — ONDANSETRON HCL 4 MG/2ML IJ SOLN
INTRAMUSCULAR | Status: DC | PRN
  Administered 2022-08-25: 4 mg via INTRAVENOUS

## 2022-08-25 MED ORDER — ACETAMINOPHEN 500 MG PO TABS
ORAL_TABLET | ORAL | Status: AC
  Filled 2022-08-25: qty 2

## 2022-08-25 MED ORDER — IBUPROFEN 600 MG PO TABS: 600.0000 mg | ORAL_TABLET | Freq: Four times a day (QID) | ORAL | 1 refills | Status: DC

## 2022-08-25 MED ORDER — LIDOCAINE HCL (CARDIAC) PF 100 MG/5ML IV SOSY
PREFILLED_SYRINGE | INTRAVENOUS | Status: DC | PRN
  Administered 2022-08-25: 100 mg via INTRAVENOUS

## 2022-08-25 MED ORDER — CEFAZOLIN SODIUM-DEXTROSE 2-4 GM/100ML-% IV SOLN
2.0000 g | INTRAVENOUS | Status: AC
  Administered 2022-08-25: 2 g via INTRAVENOUS

## 2022-08-25 MED ORDER — SCOPOLAMINE 1 MG/3DAYS TD PT72
MEDICATED_PATCH | TRANSDERMAL | Status: AC
  Filled 2022-08-25: qty 1

## 2022-08-25 MED ORDER — PHENYLEPHRINE HCL (PRESSORS) 10 MG/ML IV SOLN
INTRAVENOUS | Status: AC
  Filled 2022-08-25: qty 1

## 2022-08-25 MED ORDER — MIDAZOLAM HCL 2 MG/2ML IJ SOLN
INTRAMUSCULAR | Status: AC
  Filled 2022-08-25: qty 2

## 2022-08-25 MED ORDER — LIDOCAINE 2% (20 MG/ML) 5 ML SYRINGE
INTRAMUSCULAR | Status: AC
  Filled 2022-08-25: qty 25

## 2022-08-25 MED ORDER — MIDAZOLAM HCL 5 MG/5ML IJ SOLN
INTRAMUSCULAR | Status: DC | PRN
  Administered 2022-08-25: 2 mg via INTRAVENOUS

## 2022-08-25 MED ORDER — FENTANYL CITRATE (PF) 100 MCG/2ML IJ SOLN
INTRAMUSCULAR | Status: DC | PRN
  Administered 2022-08-25: 50 ug via INTRAVENOUS

## 2022-08-25 MED ORDER — LACTATED RINGERS IV SOLN: INTRAVENOUS | Status: DC

## 2022-08-25 MED ORDER — 0.9 % SODIUM CHLORIDE (POUR BTL) OPTIME
TOPICAL | Status: DC | PRN
  Administered 2022-08-25: 120 mL

## 2022-08-25 MED ORDER — DOCUSATE SODIUM 100 MG PO CAPS: 100.0000 mg | ORAL_CAPSULE | Freq: Two times a day (BID) | ORAL | 2 refills | Status: DC

## 2022-08-25 MED ORDER — OXYCODONE HCL 5 MG/5ML PO SOLN: 5.0000 mg | Freq: Once | ORAL | Status: DC | PRN

## 2022-08-25 MED ORDER — OXYCODONE HCL 5 MG PO TABS: 5.0000 mg | ORAL_TABLET | Freq: Once | ORAL | Status: DC | PRN

## 2022-08-25 MED ORDER — ACETAMINOPHEN 500 MG PO TABS
1000.0000 mg | ORAL_TABLET | Freq: Once | ORAL | Status: AC
  Administered 2022-08-25: 1000 mg via ORAL

## 2022-08-25 MED ORDER — DEXMEDETOMIDINE HCL IN NACL 80 MCG/20ML IV SOLN
INTRAVENOUS | Status: AC
  Filled 2022-08-25: qty 20

## 2022-08-25 MED ORDER — SCOPOLAMINE 1 MG/3DAYS TD PT72
1.0000 | MEDICATED_PATCH | Freq: Once | TRANSDERMAL | Status: DC
  Administered 2022-08-25: 1.5 mg via TRANSDERMAL

## 2022-08-25 MED ORDER — CEFAZOLIN SODIUM-DEXTROSE 2-4 GM/100ML-% IV SOLN
INTRAVENOUS | Status: AC
  Filled 2022-08-25: qty 100

## 2022-08-25 MED ORDER — DEXAMETHASONE SODIUM PHOSPHATE 4 MG/ML IJ SOLN
INTRAMUSCULAR | Status: DC | PRN
  Administered 2022-08-25: 4 mg via INTRAVENOUS

## 2022-08-25 MED ORDER — HEPARIN SODIUM (PORCINE) 5000 UNIT/ML IJ SOLN
5000.0000 [IU] | Freq: Once | INTRAMUSCULAR | Status: AC
  Administered 2022-08-25: 5000 [IU] via SUBCUTANEOUS

## 2022-08-25 MED ORDER — PROPOFOL 10 MG/ML IV BOLUS
INTRAVENOUS | Status: DC | PRN
  Administered 2022-08-25: 160 mg via INTRAVENOUS

## 2022-08-25 MED ORDER — BUPIVACAINE HCL (PF) 0.5 % IJ SOLN
INTRAMUSCULAR | Status: AC
  Filled 2022-08-25: qty 30

## 2022-08-25 MED ORDER — OXYCODONE HCL 5 MG PO TABS: 5.0000 mg | ORAL_TABLET | ORAL | 0 refills | Status: DC | PRN

## 2022-08-25 SURGICAL SUPPLY — 58 items
ADH SKN CLS APL DERMABOND .7 (GAUZE/BANDAGES/DRESSINGS) ×1
APL PRP STRL LF DISP 70% ISPRP (MISCELLANEOUS) ×1
BLADE CLIPPER SURG (BLADE) IMPLANT
BLADE SURG 10 STRL SS (BLADE) ×1 IMPLANT
BLADE SURG 15 STRL LF DISP TIS (BLADE) ×1 IMPLANT
BLADE SURG 15 STRL SS (BLADE) ×1
CANISTER SUCT 1200ML W/VALVE (MISCELLANEOUS) IMPLANT
CHLORAPREP W/TINT 26 (MISCELLANEOUS) ×1 IMPLANT
CLSR STERI-STRIP ANTIMIC 1/2X4 (GAUZE/BANDAGES/DRESSINGS) ×1 IMPLANT
COVER BACK TABLE 60X90IN (DRAPES) ×1 IMPLANT
COVER MAYO STAND STRL (DRAPES) ×1 IMPLANT
DERMABOND ADVANCED .7 DNX12 (GAUZE/BANDAGES/DRESSINGS) ×1 IMPLANT
DRAPE LAPAROTOMY 100X72 PEDS (DRAPES) ×1 IMPLANT
DRAPE U-SHAPE 76X120 STRL (DRAPES) IMPLANT
DRAPE UTILITY XL STRL (DRAPES) ×1 IMPLANT
DRSG TEGADERM 4X4.75 (GAUZE/BANDAGES/DRESSINGS) IMPLANT
ELECT COATED BLADE 2.86 ST (ELECTRODE) IMPLANT
ELECT NDL BLADE 2-5/6 (NEEDLE) IMPLANT
ELECT NEEDLE BLADE 2-5/6 (NEEDLE) ×1 IMPLANT
ELECT REM PT RETURN 9FT ADLT (ELECTROSURGICAL) ×1
ELECTRODE REM PT RTRN 9FT ADLT (ELECTROSURGICAL) ×1 IMPLANT
GAUZE PACKING IODOFORM 1/4X15 (PACKING) IMPLANT
GAUZE SPONGE 4X4 12PLY STRL LF (GAUZE/BANDAGES/DRESSINGS) IMPLANT
GLOVE BIO SURGEON STRL SZ 6 (GLOVE) IMPLANT
GLOVE BIO SURGEON STRL SZ 6.5 (GLOVE) ×1 IMPLANT
GLOVE BIOGEL PI IND STRL 6 (GLOVE) ×1 IMPLANT
GLOVE BIOGEL PI IND STRL 7.0 (GLOVE) IMPLANT
GLOVE BIOGEL PI IND STRL 7.5 (GLOVE) IMPLANT
GLOVE ECLIPSE 6.5 STRL STRAW (GLOVE) IMPLANT
GLOVE SKINSENSE STRL SZ6.5 (GLOVE) IMPLANT
GOWN STRL REUS W/ TWL LRG LVL3 (GOWN DISPOSABLE) ×2 IMPLANT
GOWN STRL REUS W/TWL LRG LVL3 (GOWN DISPOSABLE) ×2
KIT MARKER MARGIN INK (KITS) IMPLANT
NDL HYPO 25X1 1.5 SAFETY (NEEDLE) ×1 IMPLANT
NEEDLE HYPO 25X1 1.5 SAFETY (NEEDLE) ×1 IMPLANT
NS IRRIG 1000ML POUR BTL (IV SOLUTION) IMPLANT
PACK BASIN DAY SURGERY FS (CUSTOM PROCEDURE TRAY) ×1 IMPLANT
PENCIL SMOKE EVACUATOR (MISCELLANEOUS) ×1 IMPLANT
SLEEVE SCD COMPRESS KNEE MED (STOCKING) ×1 IMPLANT
SPIKE FLUID TRANSFER (MISCELLANEOUS) IMPLANT
SPONGE T-LAP 18X18 ~~LOC~~+RFID (SPONGE) ×1 IMPLANT
SUT ETHILON 2 0 FS 18 (SUTURE) IMPLANT
SUT MNCRL AB 4-0 PS2 18 (SUTURE) ×1 IMPLANT
SUT SILK 2 0 SH (SUTURE) IMPLANT
SUT VIC AB 2-0 SH 27 (SUTURE)
SUT VIC AB 2-0 SH 27XBRD (SUTURE) IMPLANT
SUT VIC AB 3-0 SH 27 (SUTURE) ×1
SUT VIC AB 3-0 SH 27X BRD (SUTURE) IMPLANT
SUT VICRYL 3-0 CR8 SH (SUTURE) IMPLANT
SWAB COLLECTION DEVICE MRSA (MISCELLANEOUS) IMPLANT
SWAB CULTURE ESWAB REG 1ML (MISCELLANEOUS) IMPLANT
SYR BULB EAR ULCER 3OZ GRN STR (SYRINGE) IMPLANT
SYR BULB IRRIG 60ML STRL (SYRINGE) ×1 IMPLANT
SYR CONTROL 10ML LL (SYRINGE) ×1 IMPLANT
TOWEL GREEN STERILE FF (TOWEL DISPOSABLE) ×1 IMPLANT
TUBE CONNECTING 20X1/4 (TUBING) IMPLANT
UNDERPAD 30X36 HEAVY ABSORB (UNDERPADS AND DIAPERS) IMPLANT
YANKAUER SUCT BULB TIP NO VENT (SUCTIONS) IMPLANT

## 2022-08-25 NOTE — Anesthesia Postprocedure Evaluation (Signed)
Anesthesia Post Note  Patient: Jacqueline Holmes  Procedure(s) Performed: EXCISION OF SOFT TISSUE MASS BASE OF LEFT NECK (Left: Neck)     Patient location during evaluation: PACU Anesthesia Type: General Level of consciousness: awake and alert Pain management: pain level controlled Vital Signs Assessment: post-procedure vital signs reviewed and stable Respiratory status: spontaneous breathing, nonlabored ventilation and respiratory function stable Cardiovascular status: blood pressure returned to baseline Postop Assessment: no apparent nausea or vomiting Anesthetic complications: yes   Encounter Notable Events  Notable Event Outcome Phase Comment  Difficult to intubate - expected  Intraprocedure Filed from anesthesia note documentation.    Last Vitals:  Vitals:   08/25/22 0845 08/25/22 0859  BP: (!) 148/88 (!) 148/94  Pulse: 95 95  Resp: 19 16  Temp:  (!) 36.3 C  SpO2: 99% 96%    Last Pain:  Vitals:   08/25/22 0859  TempSrc: Oral  PainSc: 0-No pain                 Marthenia Rolling

## 2022-08-25 NOTE — Op Note (Signed)
   Operative Note   Date: 08/25/2022  Procedure: excision of soft tissue mass, base of left neck  Pre-op diagnosis: soft tissue mass, base of left neck, 0.5x0.5cm Post-op diagnosis: same  Indication and clinical history: The patient is a 51 y.o. year old female with soft tissue mass, base of left neck     Surgeon: Jesusita Oka, MD  Anesthesiologist: Daiva Huge, MD Anesthesia: General  Findings:  Specimen: soft tissue mass, base of left neck EBL: <5cc Drains/Implants: none  Disposition: PACU - hemodynamically stable.  Description of procedure: The patient was positioned supine on the operating room table. General anesthetic induction and intubation were uneventful. Foley catheter insertion was performed and was atraumatic. Time-out was performed verifying correct patient, procedure, signature of informed consent, laterality/marked site, and administration of pre-operative antibiotics. The posterior neck was prepped and draped in the usual sterile fashion after clipping a small amount of hair.  Local anesthetic was infiltrated and an elliptical incision was made around the mass. The mass was circumferentially excised and sent to pathology as a permanent specimen. The wound was closed with 3-0 vicryl interrupted sutures deep and 4-0 monocryl subcuticular.Dermabond was applied as sterile dressing.   All sponge and instrument counts were correct at the conclusion of the procedure. The patient was awakened from anesthesia, extubated uneventfully, and transported to the PACU in good condition. There were no complications.     Jesusita Oka, MD General and [place] [facility]

## 2022-08-25 NOTE — Anesthesia Procedure Notes (Signed)
Procedure Name: Intubation Date/Time: 08/25/2022 7:37 AM  Performed by: Vandella Ord, Ernesta Amble, CRNAPre-anesthesia Checklist: Patient identified, Emergency Drugs available, Suction available and Patient being monitored Patient Re-evaluated:Patient Re-evaluated prior to induction Oxygen Delivery Method: Circle system utilized Preoxygenation: Pre-oxygenation with 100% oxygen Induction Type: IV induction Ventilation: Mask ventilation without difficulty Laryngoscope Size: Mac, 3 and Glidescope Grade View: Grade III Tube type: Oral Tube size: 7.0 mm Number of attempts: 2 Airway Equipment and Method: Stylet, Oral airway and Video-laryngoscopy Placement Confirmation: ETT inserted through vocal cords under direct vision, positive ETCO2 and breath sounds checked- equal and bilateral Tube secured with: Tape Dental Injury: Teeth and Oropharynx as per pre-operative assessment  Difficulty Due To: Difficulty was anticipated Comments: Easy IV induction, easy mask airway, Limited mouth opening and narrow palate, receeding chin, DL x 1 unable to see VC but small portion, esphogeal intubation with immediate recognition, ETT removed and pt masked, DL with glidescope VC visualized easy placement of 7.0 ETT BBS +ETCO2 teeth gums unchanged

## 2022-08-25 NOTE — Discharge Instructions (Addendum)
May shower beginning 08/26/2022. Do not peel off or scrub skin glue. May allow warm soapy water to run over incision, then rinse and pat dry. Do not soak in any water (tubs, hot tubs, pools, lakes, oceans) for one week.   May resume sexual activity when it is comfortable.   Pain regimen: take over-the-counter tylenol (acetaminophen) '1000mg'$  every six hours, the prescription ibuprofen ('600mg'$ ) every six hours and the robaxin (methocarbamol) '750mg'$  every six hours. With all three of these, you should be taking something every two hours. Example: tylenol ( acetaminophen) at 8am, ibuprofen at 10am, robaxin (methocarbamol) at 12pm, tylenol (acetaminophen) again at 2pm, ibuprofen again at 4pm, robaxin (methocarbamol) at 6pm. You also have a prescription for oxycodone, which should be taken if the tylenol (acetaminophen), ibuprofen, and robaxin (methocarbamol) are not enough to control your pain. You may take the oxycodone as frequently as every four hours as needed, but if you are taking the other medications as above, you should not need the oxycodone this frequently. You have also been given a prescription for colace (docusate) which is a stool softener. Please take this as prescribed because the oxycodone can cause constipation and the colace (docusate) will minimize or prevent constipation. Do not drive while taking or under the influence of the oxycodone as it is a narcotic medication.  Call the office at [phone] for temperature greater than 101.78F, worsening pain, redness or warmth at the incision site.  Please call [phone] to make an appointment for 2-3 weeks after surgery for wound check.   No Tylenol until after 12:40pm today if needed   Post Anesthesia Home Care Instructions  Activity: Get plenty of rest for the remainder of the day. A responsible individual must stay with you for 24 hours following the procedure.  For the next 24 hours, DO NOT: -Drive a car -Paediatric nurse -Drink  alcoholic beverages -Take any medication unless instructed by your physician -Make any legal decisions or sign important papers.  Meals: Start with liquid foods such as gelatin or soup. Progress to regular foods as tolerated. Avoid greasy, spicy, heavy foods. If nausea and/or vomiting occur, drink only clear liquids until the nausea and/or vomiting subsides. Call your physician if vomiting continues.  Special Instructions/Symptoms: Your throat may feel dry or sore from the anesthesia or the breathing tube placed in your throat during surgery. If this causes discomfort, gargle with warm salt water. The discomfort should disappear within 24 hours.  If you had a scopolamine patch placed behind your ear for the management of post- operative nausea and/or vomiting:  1. The medication in the patch is effective for 72 hours, after which it should be removed.  Wrap patch in a tissue and discard in the trash. Wash hands thoroughly with soap and water. 2. You may remove the patch earlier than 72 hours if you experience unpleasant side effects which may include dry mouth, dizziness or visual disturbances. 3. Avoid touching the patch. Wash your hands with soap and water after contact with the patch.

## 2022-08-25 NOTE — H&P (Addendum)
Jacqueline Holmes is an 51 y.o. female.   HPI: 40F with soft tissue mass at the base of the left neck. Desires excision. The patient has had no hospitalizations, doctors visits, ER visits, surgeries, or newly diagnosed allergies since being seen in the office.    Past Medical History:  Diagnosis Date   Abnormal uterine bleeding (AUB)    march/april 2021   Allergy    Angio-edema    Asthma    Cancer ([place])    skin cancer removed    Cough    non prod   Elevated BP without diagnosis of hypertension    high with DOCTOR  visits , then goes back to normal    Family history of breast cancer    MGM   Family history of pancreatic cancer    Family history of prostate cancer    Fatigue    Generalized headaches    migraines on occasion.   GERD (gastroesophageal reflux disease)    Graves disease 03/2010   Heart murmur    History of COVID-19 06/26/2020   Hyperlipidemia    Hypothyroidism    s/p thyroidectomy for Grave's disease (Dr. Tye Savoy at [facility])   IBS (irritable bowel syndrome)    Incontinence    Infertility, female    Migraine    without aura   Plantar fasciitis, bilateral 2021   Pre-diabetes    Recurrent upper respiratory infection (URI)    last uri dec 2020   Sinus problem    runny nose   Sleep apnea    mild no cpap needed    Uterine polyp     Past Surgical History:  Procedure Laterality Date   ABDOMINOPLASTY  12/2015   Dr. Towanda Malkin   BREAST REDUCTION SURGERY  11/03/2018   BREAST SURGERY  10/2018   breast reduction   CESAREAN SECTION     x 1   DILATATION & CURETTAGE/HYSTEROSCOPY WITH TRUECLEAR N/A 01/16/2014   Procedure: DILATATION & CURETTAGE/HYSTEROSCOPY WITH TRUCLEAR;  Surgeon: Marylynn Pearson, MD;  Location: [facility];  Service: Gynecology;  Laterality: N/A;   LASIK     LASIK Bilateral    mid-30's   prk laser eye surgery Left 08/2019   ROBOTIC ASSISTED SALPINGO OOPHERECTOMY Bilateral 03/28/2020   Procedure: XI ROBOTIC ASSISTED SALPINGO  OOPHORECTOMY;;  Surgeon: Lafonda Mosses, MD;  Location: [facility];  Service: Gynecology;  Laterality: Bilateral;   skin cancer remove  06/2020   TOTAL THYROIDECTOMY  07/30/2011   Dr. Harlow Asa (Grave's disease)   [place] [facility]oll BIOPSY N/A 01/16/2014   Procedure: VULVAR BIOPSY;  Surgeon: Marylynn Pearson, MD;  Location: [facility];  Service: Gynecology;  Laterality: N/A;    Family History  Problem Relation Age of Onset   Prostate cancer Father        dx. in his 26s   Allergic rhinitis Mother    Asthma Mother    Alpha-1 antitrypsin deficiency Mother    COPD Mother    Diabetes Maternal Grandmother    Pancreatic cancer Maternal Grandmother        dx. in her early 47s   Heart disease Maternal Grandfather        CABG in 60's   Diabetes Maternal Grandfather    Colon polyps Maternal Grandfather    Diabetes Paternal Grandmother    Diabetes Paternal Grandfather    Heart disease Paternal Grandfather    Prostate cancer Paternal Uncle        dx. in his 36s  Angioedema Neg Hx    Eczema Neg Hx    Colon cancer Neg Hx    Esophageal cancer Neg Hx    Rectal cancer Neg Hx    Stomach cancer Neg Hx     Social History:  reports that she has never smoked. She has never used smokeless tobacco. She reports current alcohol use. She reports that she does not use drugs.  Allergies:  Allergies  Allergen Reactions   B-12 [Cyanocobalamin] Anaphylaxis    Generalized itching following 03/05/22 IM injection. No benefit from Benadryl. Started steroid taper    Medications: I have reviewed the patient's current medications.  No results found for this or any previous visit (from the past 48 hour(s)).  No results found.  ROS 10 point review of systems is negative except as listed above in HPI.   Physical Exam Blood pressure (!) 138/101, pulse 92, temperature 97.9 F (36.6 C), temperature source Oral, resp. rate 17, height 5' 1.5" (1.562 m), weight 81.6 kg, last menstrual period  09/30/2016, SpO2 98 %. Constitutional: well-developed, well-nourished HEENT: pupils equal, round, reactive to light, 52m b/l, moist conjunctiva, external inspection of ears and nose normal, hearing intact Oropharynx: normal oropharyngeal mucosa, normal dentition Neck: no thyromegaly, trachea midline, no midline cervical tenderness to palpation Chest: breath sounds equal bilaterally, normal respiratory effort, no midline or lateral chest wall tenderness to palpation/deformity Abdomen: soft, NT, no bruising, no hepatosplenomegaly GU: normal female genitalia  Back: no wounds, no thoracic/lumbar spine tenderness to palpation, no thoracic/lumbar spine stepoffs Rectal: deferred Extremities: 2+ radial and pedal pulses bilaterally, intact motor and sensation bilateral UE and LE, no peripheral edema MSK: unable to assess gait/station, no clubbing/cyanosis of fingers/toes, normal ROM of all four extremities Skin: warm, dry, no rashes Psych: normal memory, normal mood/affect     Assessment/Plan: 44F with soft tissue mass at the base of the left neck, desiring excision. Suspect inclusion cyst at base of hairline. Site marked after patient identification of prior site. Informed consent was obtained after detailed explanation of risks, including bleeding, infection, hematoma/seroma, dehiscence, temporary or permanent neuropathy, recurrence, hair loss, poor cosmesis. All questions answered to the patient's satisfaction.   AJesusita Oka MD General and [facility]

## 2022-08-25 NOTE — Transfer of Care (Signed)
Immediate Anesthesia Transfer of Care Note  Patient: Jacqueline Holmes  Procedure(s) Performed: EXCISION OF SOFT TISSUE MASS BASE OF LEFT NECK (Left: Neck)  Patient Location: PACU  Anesthesia Type:General  Level of Consciousness: awake, alert , oriented and patient cooperative  Airway & Oxygen Therapy: Patient Spontanous Breathing and Patient connected to face mask oxygen  Post-op Assessment: Report given to RN and Post -op Vital signs reviewed and stable  Post vital signs: Reviewed and stable  Last Vitals:  Vitals Value Taken Time  BP    Temp    Pulse 106 08/25/22 0830  Resp    SpO2 100 % 08/25/22 0830    Last Pain:  Vitals:   08/25/22 1027  TempSrc: Oral  PainSc: 0-No pain         Complications:  Encounter Notable Events  Notable Event Outcome Phase Comment  Difficult to intubate - expected  Intraprocedure Filed from anesthesia note documentation.

## 2022-08-26 ENCOUNTER — Encounter (HOSPITAL_BASED_OUTPATIENT_CLINIC_OR_DEPARTMENT_OTHER): Payer: Self-pay | Admitting: Surgery

## 2022-08-26 LAB — SURGICAL PATHOLOGY

## 2022-08-28 ENCOUNTER — Ambulatory Visit (INDEPENDENT_AMBULATORY_CARE_PROVIDER_SITE_OTHER): Payer: BC Managed Care – PPO

## 2022-08-28 DIAGNOSIS — J309 Allergic rhinitis, unspecified: Secondary | ICD-10-CM

## 2022-09-01 ENCOUNTER — Ambulatory Visit: Payer: BC Managed Care – PPO | Admitting: Allergy & Immunology

## 2022-09-01 ENCOUNTER — Encounter: Payer: Self-pay | Admitting: Allergy & Immunology

## 2022-09-01 ENCOUNTER — Ambulatory Visit: Payer: Self-pay

## 2022-09-01 VITALS — BP 110/80 | HR 104 | Temp 97.8°F | Resp 16 | Wt 182.6 lb

## 2022-09-01 DIAGNOSIS — J309 Allergic rhinitis, unspecified: Secondary | ICD-10-CM

## 2022-09-01 DIAGNOSIS — J302 Other seasonal allergic rhinitis: Secondary | ICD-10-CM

## 2022-09-01 DIAGNOSIS — J3089 Other allergic rhinitis: Secondary | ICD-10-CM | POA: Diagnosis not present

## 2022-09-01 DIAGNOSIS — J4540 Moderate persistent asthma, uncomplicated: Secondary | ICD-10-CM

## 2022-09-01 DIAGNOSIS — K219 Gastro-esophageal reflux disease without esophagitis: Secondary | ICD-10-CM

## 2022-09-01 MED ORDER — FAMOTIDINE 40 MG PO TABS: 40.0000 mg | ORAL_TABLET | Freq: Two times a day (BID) | ORAL | 5 refills | Status: DC

## 2022-09-01 MED ORDER — LEVOCETIRIZINE DIHYDROCHLORIDE 5 MG PO TABS: ORAL_TABLET | ORAL | 5 refills | Status: DC

## 2022-09-01 MED ORDER — TRELEGY ELLIPTA 100-62.5-25 MCG/ACT IN AEPB: 1.0000 | INHALATION_SPRAY | Freq: Every day | RESPIRATORY_TRACT | 5 refills | Status: AC

## 2022-09-01 NOTE — Patient Instructions (Addendum)
1. Moderate persistent asthma without complication - We did not do lung testing since you had a recent surgery. - We are going to try Trelegy which contains three medicines to help with breathing. - Daily controller medication(s): Trelegy 100/62.5/25 one puff once daily - Prior to physical activity: albuterol 2 puffs 10-15 minutes before physical activity. - Rescue medications: albuterol 4 puffs every 4-6 hours as needed - Asthma control goals:  * Full participation in all desired activities (may need albuterol before activity) * Albuterol use two time or less a week on average (not counting use with activity) * Cough interfering with sleep two time or less a month * Oral steroids no more than once a year * No hospitalizations  2. Seasonal and perennial allergic rhinitis - Continue with allergy shots at the same schedule.  - Continue on Xhance one spray per nostril twice daily (this can also help if there is coexisting nasal polyps).  - Continue with levocetirizine 5 mg daily. - Continue with Allegra daily.   3. Gastroesophageal reflux disease - Continue with the use of famotidine twice daily. - We are going to increase to the '40mg'$  dose to see if that happens.   4. Return in about 6 months (around 03/03/2023).    Please inform us of any Emergency Department visits, hospitalizations, or changes in symptoms. Call us before going to the ED for breathing or allergy symptoms since we might be able to fit you in for a sick visit. Feel free to contact us anytime with any questions, problems, or concerns.  It was a pleasure to see you again today! Don't be such a stranger!   Websites that have reliable patient information: 1. American Academy of Asthma, Allergy, and Immunology: www.aaaai.org 2. Food Allergy Research and Education (FARE): foodallergy.org 3. Mothers of Asthmatics: http://www.asthmacommunitynetwork.org 4. American College of Allergy, Asthma, and Immunology:  www.acaai.org   COVID-19 Vaccine Information can be found at: ShippingScam.co.uk For questions related to vaccine distribution or appointments, please email [email or web address] or call [phone].   We realize that you might be concerned about having an allergic reaction to the COVID19 vaccines. To help with that concern, WE ARE OFFERING THE COVID19 VACCINES IN OUR OFFICE! Ask the front desk for dates!     "Like" [place] on Facebook and Instagram for our latest updates!      A healthy democracy works best when [employer] participate! Make sure you are registered to vote! If you have moved or changed any of your contact information, you will need to get this updated before voting!  In some cases, you MAY be able to register to vote online: [email or web address]

## 2022-09-01 NOTE — Progress Notes (Signed)
FOLLOW UP  Date of Service/Encounter:  09/01/22   Assessment:   Mild persistent asthma, uncomplicated   Non-seasonal allergic rhinitis (trees, weeds, grasses, molds, dust mites, cat, dog and cockroach) - on allergen immunotherapy    Adverse food reaction (shellfish/fish) - with negative testing  GERD - starting higher dose famotidine BID    Plan/Recommendations:   1. Moderate persistent asthma without complication - We did not do lung testing since you had a recent surgery. - We are going to try Trelegy which contains three medicines to help with breathing. - Daily controller medication(s): Trelegy 100/62.5/25 one puff once daily - Prior to physical activity: albuterol 2 puffs 10-15 minutes before physical activity. - Rescue medications: albuterol 4 puffs every 4-6 hours as needed - Asthma control goals:  * Full participation in all desired activities (may need albuterol before activity) * Albuterol use two time or less a week on average (not counting use with activity) * Cough interfering with sleep two time or less a month * Oral steroids no more than once a year * No hospitalizations  2. Seasonal and perennial allergic rhinitis - Continue with allergy shots at the same schedule.  - Continue on Xhance one spray per nostril twice daily (this can also help if there is coexisting nasal polyps).  - Continue with levocetirizine 5 mg daily. - Continue with Allegra daily.   3. Gastroesophageal reflux disease - Continue with the use of famotidine twice daily. - We are going to increase to the '40mg'$  dose to see if that happens.   4. Return in about 6 months (around 03/03/2023).    Subjective:   Jacqueline Holmes is a 51 y.o. female presenting today for follow up of  Chief Complaint  Patient presents with   Follow-up    Asthma is ok. Don't use rescue inhaler that often at all.     Jacqueline Holmes has a history of the following: Patient Active Problem List   Diagnosis Date  Noted   Postoperative hypothyroidism 06/02/2022   Pulmonary nodule, right 06/02/2022   Vitamin B12 deficiency 06/02/2022   Vitamin D deficiency 06/02/2022   Graves disease 05/28/2022   Not well controlled moderate persistent asthma 11/07/2021   Snoring 11/07/2021   Hematuria 04/14/2021   Acute left-sided back pain 04/14/2021   Sebaceous cyst 04/14/2021   Elevated blood pressure reading in office with diagnosis of hypertension 10/22/2020   Belching 10/22/2020   Nausea 10/22/2020   Abdominal pain 10/22/2020   Gastroesophageal reflux disease 10/22/2020   Early satiety 10/22/2020   Seasonal and perennial allergic rhinitis 08/23/2020   Genetic testing 03/05/2020   Family history of prostate cancer    Family history of pancreatic cancer    Cysts of both ovaries 02/09/2020   Allergic conjunctivitis of both eyes 10/06/2019   Elevated ALT measurement 01/28/2018   Moderate persistent asthma with acute exacerbation 01/18/2018   Allergic conjunctivitis 01/18/2018   Elevated blood-pressure reading without diagnosis of hypertension 01/18/2018   Non-seasonal allergic rhinitis due to pollen 05/03/2017   Mild persistent asthma, uncomplicated 45/80/9983   Thyroid nodule, uninodular 09/28/2011   Unspecified hypothyroidism 09/10/2011   Allergic rhinitis 09/10/2011   Asthma 04/25/2008   Contact dermatitis and other eczema due to plants (except food) 08/04/2007    History obtained from: chart review and patient.  Jacqueline Holmes is a 51 y.o. female presenting for a follow up visit. She was last seen in June 2023. At that time, her lung testing looked good. She was  on Symbicort at the time, but she did not feel that this was helping at all. We tried to get Qvar approved, but this was not approved by her insurance. For her allergic rhinitis, we continued to allergy shots at the saame scheduled. We increased her advance of her vials to decrease her visits needed. We stopped her montelukast and added on Xhance one  spray per nostril twice daily. We did talk about sending her to ENT. For her GERD, we continued with the use of famotidine BID.   Since the last visit, she has mostly done well.   Asthma/Respiratory Symptom History: She remains on the Symbicort. We never did get the Qvar approved.  She has noticed that when she is moving around with a baby, she will pick her up and take her to the pack and play, she will have SOB. Symbicort is not doing the trick. Symbicort is not overly expensive. But it has never really worked well. This is a problem with large movements. She knows that some of this is due to deconditioning.   Allergic Rhinitis Symptom History: Allergy shots  are going well. She is now on Allegra and levocetirizine. She also is on the [place]. She is doing Allegra in the morning and levocetirizine at nighttime. She has not had any antibiotics for sinus infections at all.   Jacqueline Holmes is on allergen immunotherapy. She receives two injections. Immunotherapy script #1 contains weeds, grasses, dust mites, and cat. She currently receives 0.44m of the RED vial (1/100). Immunotherapy script #2 contains molds and cockroach. She currently receives 0.554mof the RED vial (1/100). She started shots August of 2018 and reached maintenance in June of 2019.    GERD Symptom History: She was on omeprazole but this caused a B12 deficiency. She then transitioned to famotidine which has not fully helped with the symptoms.   She and her husband have been doing a lot of traveling, including international cruises. 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 She is 5 years from retiring with full benefits. She watches her 6 mo granddaughter a few times a week. She has enjoyed catching her grow up.   Otherwise, there have been no changes to her past  medical history, surgical history, family history, or social history.    Review of Systems  Constitutional: Negative.  Negative for chills, fever, malaise/fatigue and weight loss.  HENT:  Positive for congestion and sinus pain. Negative for ear discharge and ear pain.        Positive for snoring.   Eyes:  Negative for pain, discharge and redness.  Respiratory:  Negative for cough, sputum production, shortness of breath and wheezing.   Cardiovascular: Negative.  Negative for chest pain and palpitations.  Gastrointestinal:  Negative for abdominal pain, constipation, diarrhea, heartburn, nausea and vomiting.  Skin: Negative.  Negative for itching and rash.  Neurological:  Negative for dizziness and headaches.  Endo/Heme/Allergies:  Positive for environmental allergies. Does not bruise/bleed easily.       Objective:   Blood pressure 110/80, pulse (!) 104, temperature 97.8 F (36.6 C), temperature source Temporal, resp. rate 16, weight 182 lb 9.6 oz (82.8 kg), last menstrual period 09/30/2016, SpO2 98 %. Body mass index is 33.94 kg/m.    Physical Exam Vitals reviewed.  Constitutional:      Appearance: She  is well-developed.  HENT:     Head: Normocephalic and atraumatic.     Right Ear: Tympanic membrane, ear canal and external ear normal.     Left Ear: Tympanic membrane, ear canal and external ear normal.     Nose: No nasal deformity, septal deviation, mucosal edema or rhinorrhea.     Right Turbinates: Enlarged, swollen and pale.     Left Turbinates: Enlarged, swollen and pale.     Right Sinus: No maxillary sinus tenderness or frontal sinus tenderness.     Left Sinus: No maxillary sinus tenderness or frontal sinus tenderness.     Comments: Turbinates are markedly enlarged. I do not appreciate any nasal polyps.     Mouth/Throat:     Mouth: Mucous membranes are not pale and not dry.     Pharynx: Uvula midline.     Comments: Cobblestoning in the posterior oropharynx.  Eyes:      General: Lids are normal. No allergic shiner.       Right eye: No discharge.        Left eye: No discharge.     Conjunctiva/sclera: Conjunctivae normal.     Right eye: Right conjunctiva is not injected. No chemosis.    Left eye: Left conjunctiva is not injected. No chemosis.    Pupils: Pupils are equal, round, and reactive to light.  Cardiovascular:     Rate and Rhythm: Normal rate and regular rhythm.     Heart sounds: Normal heart sounds.  Pulmonary:     Effort: Pulmonary effort is normal. No tachypnea, accessory muscle usage or respiratory distress.     Breath sounds: Normal breath sounds. No wheezing, rhonchi or rales.     Comments: Moving air well in all lung fields. No increased work of breathing noted.  Chest:     Chest wall: No tenderness.  Lymphadenopathy:     Cervical: No cervical adenopathy.  Skin:    Coloration: Skin is not pale.     Findings: No abrasion, erythema, petechiae or rash. Rash is not papular, urticarial or vesicular.  Neurological:     Mental Status: She is alert.  Psychiatric:        Behavior: Behavior is cooperative.      Diagnostic studies: none     Salvatore Marvel, MD  [facility]

## 2022-09-08 ENCOUNTER — Telehealth: Payer: BC Managed Care – PPO | Admitting: Family Medicine

## 2022-09-08 DIAGNOSIS — R3989 Other symptoms and signs involving the genitourinary system: Secondary | ICD-10-CM

## 2022-09-08 MED ORDER — PHENAZOPYRIDINE HCL 200 MG PO TABS: 200.0000 mg | ORAL_TABLET | Freq: Three times a day (TID) | ORAL | 0 refills | Status: DC | PRN

## 2022-09-08 MED ORDER — NITROFURANTOIN MONOHYD MACRO 100 MG PO CAPS: 100.0000 mg | ORAL_CAPSULE | Freq: Two times a day (BID) | ORAL | 0 refills | Status: AC

## 2022-09-08 NOTE — Progress Notes (Signed)
Virtual Visit Consent   Jacqueline Holmes, you are scheduled for a virtual visit with a [facility] provider today. Just as with appointments in the office, your consent must be obtained to participate. Your consent will be active for this visit and any virtual visit you may have with one of our providers in the next 365 days. If you have a MyChart account, a copy of this consent can be sent to you electronically.  As this is a virtual visit, video technology does not allow for your provider to perform a traditional examination. This may limit your provider's ability to fully assess your condition. If your provider identifies any concerns that need to be evaluated in person or the need to arrange testing (such as labs, EKG, etc.), we will make arrangements to do so. Although advances in technology are sophisticated, we cannot ensure that it will always work on either your end or our end. If the connection with a video visit is poor, the visit may have to be switched to a telephone visit. With either a video or telephone visit, we are not always able to ensure that we have a secure connection.  By engaging in this virtual visit, you consent to the provision of healthcare and authorize for your insurance to be billed (if applicable) for the services provided during this visit. Depending on your insurance coverage, you may receive a charge related to this service.  I need to obtain your verbal consent now. Are you willing to proceed with your visit today? Jacqueline Holmes has provided verbal consent on 09/08/2022 for a virtual visit (video or telephone). Perlie Mayo, NP  Date: 09/08/2022 2:07 PM  Virtual Visit via Video Note   I, Perlie Mayo, connected with  Jacqueline Holmes  ([id number], [birth year]) on 09/08/22 at  2:15 PM EDT by a video-enabled telemedicine application and verified that I am speaking with the correct person using two identifiers.  Location: Patient: Virtual Visit Location Patient:  Home Provider: Virtual Visit Location Provider: Home Office   I discussed the limitations of evaluation and management by telemedicine and the availability of in person appointments. The patient expressed understanding and agreed to proceed.    History of Present Illness: Jacqueline Holmes is a 51 y.o. who identifies as a female who was assigned female at birth, and is being seen today for UTI symptoms.  HPI: Urinary Tract Infection  This is a new problem. The current episode started in the past 7 days (Saturday 09/05/2022). The problem occurs every urination. The problem has been gradually worsening. The quality of the pain is described as aching (pressure). The pain is at a severity of 5/10. The pain is moderate. There has been no fever. She is Sexually active. There is No history of pyelonephritis. Associated symptoms include frequency and urgency. Pertinent negatives include no chills, discharge, flank pain, hematuria, hesitancy, nausea, possible pregnancy, sweats or vomiting. She has tried increased fluids (AZO OTC- used Saturday into Sunday- felt better, so she stopped it. Then took last two doses Monday because she started feeling bad again.) for the symptoms. The treatment provided mild relief. There is no history of catheterization, kidney stones, recurrent UTIs, a single kidney, urinary stasis or a urological procedure.    Problems:  Patient Active Problem List   Diagnosis Date Noted   Postoperative hypothyroidism 06/02/2022   Pulmonary nodule, right 06/02/2022   Vitamin B12 deficiency 06/02/2022   Vitamin D deficiency 06/02/2022   Graves disease 05/28/2022  Not well controlled moderate persistent asthma 11/07/2021   Snoring 11/07/2021   Hematuria 04/14/2021   Acute left-sided back pain 04/14/2021   Sebaceous cyst 04/14/2021   Elevated blood pressure reading in office with diagnosis of hypertension 10/22/2020   Belching 10/22/2020   Nausea 10/22/2020   Abdominal pain 10/22/2020    Gastroesophageal reflux disease 10/22/2020   Early satiety 10/22/2020   Seasonal and perennial allergic rhinitis 08/23/2020   Genetic testing 03/05/2020   Family history of prostate cancer    Family history of pancreatic cancer    Cysts of both ovaries 02/09/2020   Allergic conjunctivitis of both eyes 10/06/2019   Elevated ALT measurement 01/28/2018   Moderate persistent asthma with acute exacerbation 01/18/2018   Allergic conjunctivitis 01/18/2018   Elevated blood-pressure reading without diagnosis of hypertension 01/18/2018   Non-seasonal allergic rhinitis due to pollen 05/03/2017   Mild persistent asthma, uncomplicated 29/92/4268   Thyroid nodule, uninodular 09/28/2011   Unspecified hypothyroidism 09/10/2011   Allergic rhinitis 09/10/2011   Asthma 04/25/2008   Contact dermatitis and other eczema due to plants (except food) 08/04/2007    Allergies:  Allergies  Allergen Reactions   B-12 [Cyanocobalamin] Itching    Generalized itching following 03/05/22 IM injection. No benefit from Benadryl. Started steroid taper   Medications:  Current Outpatient Medications:    acetaminophen (TYLENOL) 500 MG tablet, Take 1,000 mg by mouth every 6 (six) hours as needed for headache. Reported on 06/17/2016, Disp: , Rfl:    acetaminophen (TYLENOL) 500 MG tablet, Take 2 tablets (1,000 mg total) by mouth 4 (four) times daily., Disp: 120 tablet, Rfl: 3   albuterol (VENTOLIN HFA) 108 (90 Base) MCG/ACT inhaler, Inhale 2 puffs into the lungs every 4 (four) hours as needed for wheezing or shortness of breath., Disp: 8 g, Rfl: 0   Calcium-Vitamins C & D (CALCIUM/C/D PO), Take by mouth., Disp: , Rfl:    cholecalciferol (VITAMIN D3) 25 MCG (1000 UNIT) tablet, Take 1,000 Units by mouth daily., Disp: , Rfl:    cyanocobalamin (VITAMIN B12) 1000 MCG tablet, Take 1,000 mcg by mouth daily., Disp: , Rfl:    docusate sodium (COLACE) 100 MG capsule, Take 1 capsule (100 mg total) by mouth 2 (two) times daily., Disp: 60  capsule, Rfl: 2   famotidine (PEPCID) 40 MG tablet, Take 1 tablet (40 mg total) by mouth 2 (two) times daily., Disp: 60 tablet, Rfl: 5   Fexofenadine HCl (ALLEGRA PO), Take by mouth., Disp: , Rfl:    Fluticasone Propionate (XHANCE) 93 MCG/ACT EXHU, Place 1 spray per nostril twice daily, Disp: 16 mL, Rfl: 5   Fluticasone-Umeclidin-Vilant (TRELEGY ELLIPTA) 100-62.5-25 MCG/ACT AEPB, Inhale 1 puff into the lungs daily for 28 days., Disp: 28 each, Rfl: 5   ibuprofen (ADVIL) 600 MG tablet, Take 1 tablet (600 mg total) by mouth 4 (four) times daily., Disp: 120 tablet, Rfl: 1   levocetirizine (XYZAL) 5 MG tablet, TAKE ONE TABLET BY MOUTH EVERY EVENING, Disp: 30 tablet, Rfl: 5   methocarbamol (ROBAXIN-750) 750 MG tablet, Take 1 tablet (750 mg total) by mouth 4 (four) times daily., Disp: 120 tablet, Rfl: 1   oxyCODONE (ROXICODONE) 5 MG immediate release tablet, Take 1 tablet (5 mg total) by mouth every 4 (four) hours as needed., Disp: 20 tablet, Rfl: 0   Spacer/Aero-Holding [facility] DIAMOND) DEVI, Use as directed with inhaler. Dx:  J45.40 Asthma (Patient taking differently: Use as directed with inhaler. Dx:  J45.40 Asthma), Disp: 1 Device, Rfl: 2   thyroid (  NP THYROID) 90 MG tablet, Take 1 tablet (90 mg total) by mouth in the morning and at bedtime., Disp: 60 tablet, Rfl: 1  Observations/Objective: Patient is well-developed, well-nourished in no acute distress.  Resting comfortably at home.  Head is normocephalic, atraumatic.  No labored breathing.  Speech is clear and coherent with logical content.  Patient is alert and oriented at baseline.    Assessment and Plan:  1. Suspected UTI  - nitrofurantoin, macrocrystal-monohydrate, (MACROBID) 100 MG capsule; Take 1 capsule (100 mg total) by mouth 2 (two) times daily for 5 days.  Dispense: 10 capsule; Refill: 0 - phenazopyridine (PYRIDIUM) 200 MG tablet; Take 1 tablet (200 mg total) by mouth 3 (three) times daily as needed for pain.   Dispense: 10 tablet; Refill: 0  -hydrate -encouraged to use pyridium for pain discomfort -Macrobid in full course with a cup of water with each dose -good hygiene reviewed -in person precautions reviewed- no red flags today during visit   Reviewed side effects, risks and benefits of medication.    Patient acknowledged agreement and understanding of the plan.   Past Medical, Surgical, Social History, Allergies, and Medications have been Reviewed.   Follow Up Instructions: I discussed the assessment and treatment plan with the patient. The patient was provided an opportunity to ask questions and all were answered. The patient agreed with the plan and demonstrated an understanding of the instructions.  A copy of instructions were sent to the patient via MyChart unless otherwise noted below.     The patient was advised to call back or seek an in-person evaluation if the symptoms worsen or if the condition fails to improve as anticipated.  Time:  I spent 10 minutes with the patient via telehealth technology discussing the above problems/concerns.    Perlie Mayo, NP

## 2022-09-08 NOTE — Patient Instructions (Addendum)
Jacqueline Holmes, thank you for joining Perlie Mayo, NP for today's virtual visit.  While this provider is not your primary care provider (PCP), if your PCP is located in our provider database this encounter information will be shared with them immediately following your visit.  Consent: (Patient) Jacqueline Holmes provided verbal consent for this virtual visit at the beginning of the encounter.  Current Medications:  Current Outpatient Medications:    nitrofurantoin, macrocrystal-monohydrate, (MACROBID) 100 MG capsule, Take 1 capsule (100 mg total) by mouth 2 (two) times daily for 5 days., Disp: 10 capsule, Rfl: 0   phenazopyridine (PYRIDIUM) 200 MG tablet, Take 1 tablet (200 mg total) by mouth 3 (three) times daily as needed for pain., Disp: 10 tablet, Rfl: 0   acetaminophen (TYLENOL) 500 MG tablet, Take 1,000 mg by mouth every 6 (six) hours as needed for headache. Reported on 06/17/2016, Disp: , Rfl:    acetaminophen (TYLENOL) 500 MG tablet, Take 2 tablets (1,000 mg total) by mouth 4 (four) times daily., Disp: 120 tablet, Rfl: 3   albuterol (VENTOLIN HFA) 108 (90 Base) MCG/ACT inhaler, Inhale 2 puffs into the lungs every 4 (four) hours as needed for wheezing or shortness of breath., Disp: 8 g, Rfl: 0   Calcium-Vitamins C & D (CALCIUM/C/D PO), Take by mouth., Disp: , Rfl:    cholecalciferol (VITAMIN D3) 25 MCG (1000 UNIT) tablet, Take 1,000 Units by mouth daily., Disp: , Rfl:    cyanocobalamin (VITAMIN B12) 1000 MCG tablet, Take 1,000 mcg by mouth daily., Disp: , Rfl:    docusate sodium (COLACE) 100 MG capsule, Take 1 capsule (100 mg total) by mouth 2 (two) times daily., Disp: 60 capsule, Rfl: 2   famotidine (PEPCID) 40 MG tablet, Take 1 tablet (40 mg total) by mouth 2 (two) times daily., Disp: 60 tablet, Rfl: 5   Fexofenadine HCl (ALLEGRA PO), Take by mouth., Disp: , Rfl:    Fluticasone Propionate (XHANCE) 93 MCG/ACT EXHU, Place 1 spray per nostril twice daily, Disp: 16 mL, Rfl: 5    Fluticasone-Umeclidin-Vilant (TRELEGY ELLIPTA) 100-62.5-25 MCG/ACT AEPB, Inhale 1 puff into the lungs daily for 28 days., Disp: 28 each, Rfl: 5   ibuprofen (ADVIL) 600 MG tablet, Take 1 tablet (600 mg total) by mouth 4 (four) times daily., Disp: 120 tablet, Rfl: 1   levocetirizine (XYZAL) 5 MG tablet, TAKE ONE TABLET BY MOUTH EVERY EVENING, Disp: 30 tablet, Rfl: 5   methocarbamol (ROBAXIN-750) 750 MG tablet, Take 1 tablet (750 mg total) by mouth 4 (four) times daily., Disp: 120 tablet, Rfl: 1   oxyCODONE (ROXICODONE) 5 MG immediate release tablet, Take 1 tablet (5 mg total) by mouth every 4 (four) hours as needed., Disp: 20 tablet, Rfl: 0   Spacer/Aero-Holding [facility] DIAMOND) DEVI, Use as directed with inhaler. Dx:  J45.40 Asthma (Patient taking differently: Use as directed with inhaler. Dx:  J45.40 Asthma), Disp: 1 Device, Rfl: 2   thyroid (NP THYROID) 90 MG tablet, Take 1 tablet (90 mg total) by mouth in the morning and at bedtime., Disp: 60 tablet, Rfl: 1   Medications ordered in this encounter:  Meds ordered this encounter  Medications   nitrofurantoin, macrocrystal-monohydrate, (MACROBID) 100 MG capsule    Sig: Take 1 capsule (100 mg total) by mouth 2 (two) times daily for 5 days.    Dispense:  10 capsule    Refill:  0    Order Specific Question:   Supervising Provider    Answer:   Lanny Cramp, PHILIP  O [[id number]]   phenazopyridine (PYRIDIUM) 200 MG tablet    Sig: Take 1 tablet (200 mg total) by mouth 3 (three) times daily as needed for pain.    Dispense:  10 tablet    Refill:  0    Order Specific Question:   Supervising Provider    Answer:   Chase Picket [id number]     *If you need refills on other medications prior to your next appointment, please contact your pharmacy*  Follow-Up: Call back or seek an in-person evaluation if the symptoms worsen or if the condition fails to improve as anticipated.  [place] [phone]  Other  Instructions Urinary Tract Infection, Adult A urinary tract infection (UTI) is an infection of any part of the urinary tract. The urinary tract includes: The kidneys. The ureters. The bladder. The urethra. These organs make, store, and get rid of pee (urine) in the body. What are the causes? This infection is caused by germs (bacteria) in your genital area. These germs grow and cause swelling (inflammation) of your urinary tract. What increases the risk? The following factors may make you more likely to develop this condition: Using a small, thin tube (catheter) to drain pee. Not being able to control when you pee or poop (incontinence). Being female. If you are female, these things can increase the risk: Using these methods to prevent pregnancy: A medicine that kills sperm (spermicide). A device that blocks sperm (diaphragm). Having low levels of a female hormone (estrogen). Being pregnant. You are more likely to develop this condition if: You have genes that add to your risk. You are sexually active. You take antibiotic medicines. You have trouble peeing because of: A prostate that is bigger than normal, if you are female. A blockage in the part of your body that drains pee from the bladder. A kidney stone. A nerve condition that affects your bladder. Not getting enough to drink. Not peeing often enough. You have other conditions, such as: Diabetes. A weak disease-fighting system (immune system). Sickle cell disease. Gout. Injury of the spine. What are the signs or symptoms? Symptoms of this condition include: Needing to pee right away. Peeing small amounts often. Pain or burning when peeing. Blood in the pee. Pee that smells bad or not like normal. Trouble peeing. Pee that is cloudy. Fluid coming from the vagina, if you are female. Pain in the belly or lower back. Other symptoms include: Vomiting. Not feeling hungry. Feeling mixed up (confused). This may be the  first symptom in older adults. Being tired and grouchy (irritable). A fever. Watery poop (diarrhea). How is this treated? Taking antibiotic medicine. Taking other medicines. Drinking enough water. In some cases, you may need to see a specialist. Follow these instructions at home:  Medicines Take over-the-counter and prescription medicines only as told by your doctor. If you were prescribed an antibiotic medicine, take it as told by your doctor. Do not stop taking it even if you start to feel better. General instructions Make sure you: Pee until your bladder is empty. Do not hold pee for a long time. Empty your bladder after sex. Wipe from front to back after peeing or pooping if you are a female. Use each tissue one time when you wipe. Drink enough fluid to keep your pee pale yellow. Keep all follow-up visits. Contact a doctor if: You do not get better after 1-2 days. Your symptoms go away and then come back. Get help right away if: You have  very bad back pain. You have very bad pain in your lower belly. You have a fever. You have chills. You feeling like you will vomit or you vomit. Summary A urinary tract infection (UTI) is an infection of any part of the urinary tract. This condition is caused by germs in your genital area. There are many risk factors for a UTI. Treatment includes antibiotic medicines. Drink enough fluid to keep your pee pale yellow. This information is not intended to replace advice given to you by your health care provider. Make sure you discuss any questions you have with your health care provider. Document Revised: 06/28/2020 Document Reviewed: 06/28/2020 Elsevier Patient Education  [place].    If you have been instructed to have an in-person evaluation today at a local Urgent Care facility, please use the link below. It will take you to a list of all of our available [facility], including address, phone number and hours of  operation. Please do not delay care.  [facility]  If you or a family member do not have a primary care provider, use the link below to schedule a visit and establish care. When you choose a [facility] primary care physician or advanced practice provider, you gain a long-term partner in health. Find a Primary Care Provider  Learn more about [facility] in-office and virtual care options: [facility]

## 2022-09-09 ENCOUNTER — Ambulatory Visit: Payer: BC Managed Care – PPO | Admitting: Family Medicine

## 2022-09-15 ENCOUNTER — Ambulatory Visit (INDEPENDENT_AMBULATORY_CARE_PROVIDER_SITE_OTHER): Payer: BC Managed Care – PPO | Admitting: Internal Medicine

## 2022-09-15 DIAGNOSIS — J309 Allergic rhinitis, unspecified: Secondary | ICD-10-CM

## 2022-09-23 ENCOUNTER — Ambulatory Visit (INDEPENDENT_AMBULATORY_CARE_PROVIDER_SITE_OTHER): Payer: BC Managed Care – PPO | Admitting: *Deleted

## 2022-09-23 DIAGNOSIS — J309 Allergic rhinitis, unspecified: Secondary | ICD-10-CM | POA: Diagnosis not present

## 2022-09-30 ENCOUNTER — Encounter: Payer: Self-pay | Admitting: Obstetrics and Gynecology

## 2022-10-02 ENCOUNTER — Ambulatory Visit (INDEPENDENT_AMBULATORY_CARE_PROVIDER_SITE_OTHER): Payer: BC Managed Care – PPO

## 2022-10-02 DIAGNOSIS — J309 Allergic rhinitis, unspecified: Secondary | ICD-10-CM | POA: Diagnosis not present

## 2022-10-13 ENCOUNTER — Ambulatory Visit (INDEPENDENT_AMBULATORY_CARE_PROVIDER_SITE_OTHER): Payer: BC Managed Care – PPO

## 2022-10-13 DIAGNOSIS — J309 Allergic rhinitis, unspecified: Secondary | ICD-10-CM

## 2022-10-21 ENCOUNTER — Ambulatory Visit (INDEPENDENT_AMBULATORY_CARE_PROVIDER_SITE_OTHER): Payer: BC Managed Care – PPO | Admitting: *Deleted

## 2022-10-21 DIAGNOSIS — J309 Allergic rhinitis, unspecified: Secondary | ICD-10-CM | POA: Diagnosis not present

## 2022-10-30 ENCOUNTER — Ambulatory Visit (INDEPENDENT_AMBULATORY_CARE_PROVIDER_SITE_OTHER): Payer: BC Managed Care – PPO

## 2022-10-30 DIAGNOSIS — J309 Allergic rhinitis, unspecified: Secondary | ICD-10-CM | POA: Diagnosis not present

## 2022-11-04 ENCOUNTER — Ambulatory Visit (INDEPENDENT_AMBULATORY_CARE_PROVIDER_SITE_OTHER): Payer: BC Managed Care – PPO

## 2022-11-04 DIAGNOSIS — J309 Allergic rhinitis, unspecified: Secondary | ICD-10-CM

## 2022-11-19 ENCOUNTER — Ambulatory Visit (INDEPENDENT_AMBULATORY_CARE_PROVIDER_SITE_OTHER): Payer: BC Managed Care – PPO

## 2022-11-19 DIAGNOSIS — J309 Allergic rhinitis, unspecified: Secondary | ICD-10-CM | POA: Diagnosis not present

## 2022-11-27 ENCOUNTER — Ambulatory Visit (INDEPENDENT_AMBULATORY_CARE_PROVIDER_SITE_OTHER): Payer: BC Managed Care – PPO | Admitting: *Deleted

## 2022-11-27 DIAGNOSIS — J309 Allergic rhinitis, unspecified: Secondary | ICD-10-CM | POA: Diagnosis not present

## 2022-12-03 DIAGNOSIS — J3089 Other allergic rhinitis: Secondary | ICD-10-CM

## 2022-12-04 NOTE — Progress Notes (Signed)
VIALS EXP 12-05-23

## 2022-12-08 ENCOUNTER — Encounter: Payer: Self-pay | Admitting: Internal Medicine

## 2022-12-11 ENCOUNTER — Ambulatory Visit (INDEPENDENT_AMBULATORY_CARE_PROVIDER_SITE_OTHER): Payer: BC Managed Care – PPO

## 2022-12-11 DIAGNOSIS — J309 Allergic rhinitis, unspecified: Secondary | ICD-10-CM

## 2022-12-15 ENCOUNTER — Other Ambulatory Visit: Payer: Self-pay

## 2022-12-15 MED ORDER — XHANCE 93 MCG/ACT NA EXHU: INHALANT_SUSPENSION | NASAL | 5 refills | Status: DC

## 2022-12-23 ENCOUNTER — Ambulatory Visit (INDEPENDENT_AMBULATORY_CARE_PROVIDER_SITE_OTHER): Payer: BC Managed Care – PPO | Admitting: *Deleted

## 2022-12-23 DIAGNOSIS — J309 Allergic rhinitis, unspecified: Secondary | ICD-10-CM

## 2022-12-30 ENCOUNTER — Ambulatory Visit (INDEPENDENT_AMBULATORY_CARE_PROVIDER_SITE_OTHER): Payer: BC Managed Care – PPO

## 2022-12-30 DIAGNOSIS — J309 Allergic rhinitis, unspecified: Secondary | ICD-10-CM | POA: Diagnosis not present

## 2022-12-31 ENCOUNTER — Telehealth: Payer: Self-pay | Admitting: *Deleted

## 2022-12-31 DIAGNOSIS — R911 Solitary pulmonary nodule: Secondary | ICD-10-CM

## 2022-12-31 NOTE — Telephone Encounter (Signed)
Had a note to send you a reminder to order repeat chest CT, follow on lung nodule-Feb 2024.

## 2023-01-04 NOTE — Telephone Encounter (Signed)
I entered order and sent her a message as an Pharmacist, hospital

## 2023-01-06 ENCOUNTER — Ambulatory Visit (INDEPENDENT_AMBULATORY_CARE_PROVIDER_SITE_OTHER): Payer: BC Managed Care – PPO

## 2023-01-06 DIAGNOSIS — J309 Allergic rhinitis, unspecified: Secondary | ICD-10-CM

## 2023-01-13 ENCOUNTER — Ambulatory Visit (INDEPENDENT_AMBULATORY_CARE_PROVIDER_SITE_OTHER): Payer: BC Managed Care – PPO

## 2023-01-13 DIAGNOSIS — J309 Allergic rhinitis, unspecified: Secondary | ICD-10-CM | POA: Diagnosis not present

## 2023-01-20 ENCOUNTER — Ambulatory Visit (INDEPENDENT_AMBULATORY_CARE_PROVIDER_SITE_OTHER): Payer: BC Managed Care – PPO

## 2023-01-20 DIAGNOSIS — J309 Allergic rhinitis, unspecified: Secondary | ICD-10-CM

## 2023-01-21 ENCOUNTER — Encounter: Payer: Self-pay | Admitting: Family Medicine

## 2023-01-28 ENCOUNTER — Ambulatory Visit
Admission: RE | Admit: 2023-01-28 | Discharge: 2023-01-28 | Disposition: A | Discharge status: Home / Self Care | Payer: BC Managed Care – PPO | Source: Ambulatory Visit | Attending: Family Medicine | Admitting: Family Medicine

## 2023-01-28 DIAGNOSIS — R911 Solitary pulmonary nodule: Secondary | ICD-10-CM

## 2023-02-02 ENCOUNTER — Ambulatory Visit (INDEPENDENT_AMBULATORY_CARE_PROVIDER_SITE_OTHER): Payer: BC Managed Care – PPO

## 2023-02-02 DIAGNOSIS — J309 Allergic rhinitis, unspecified: Secondary | ICD-10-CM | POA: Diagnosis not present

## 2023-02-10 ENCOUNTER — Ambulatory Visit (INDEPENDENT_AMBULATORY_CARE_PROVIDER_SITE_OTHER): Payer: BC Managed Care – PPO

## 2023-02-10 DIAGNOSIS — J309 Allergic rhinitis, unspecified: Secondary | ICD-10-CM | POA: Diagnosis not present

## 2023-02-18 ENCOUNTER — Ambulatory Visit (INDEPENDENT_AMBULATORY_CARE_PROVIDER_SITE_OTHER): Payer: BC Managed Care – PPO

## 2023-02-18 DIAGNOSIS — J309 Allergic rhinitis, unspecified: Secondary | ICD-10-CM | POA: Diagnosis not present

## 2023-03-05 ENCOUNTER — Telehealth (INDEPENDENT_AMBULATORY_CARE_PROVIDER_SITE_OTHER): Payer: BC Managed Care – PPO | Admitting: Medical

## 2023-03-05 ENCOUNTER — Encounter: Payer: Self-pay | Admitting: Medical

## 2023-03-05 VITALS — Temp 97.9°F | Ht 61.5 in | Wt 175.0 lb

## 2023-03-05 DIAGNOSIS — R059 Cough, unspecified: Secondary | ICD-10-CM

## 2023-03-05 DIAGNOSIS — J4541 Moderate persistent asthma with (acute) exacerbation: Secondary | ICD-10-CM

## 2023-03-05 DIAGNOSIS — U071 COVID-19: Secondary | ICD-10-CM

## 2023-03-05 MED ORDER — NIRMATRELVIR/RITONAVIR (PAXLOVID)TABLET: 3.0000 | ORAL_TABLET | Freq: Two times a day (BID) | ORAL | 0 refills | Status: AC

## 2023-03-05 NOTE — Progress Notes (Signed)
Subjective:     Patient ID: Jacqueline Holmes, female   DOB: 04/02/1971, 52 y.o.   MRN: [id number]  This visit type was conducted due to national recommendations for restrictions regarding the COVID-19 Pandemic (e.g. social distancing) in an effort to limit this patient's exposure and mitigate transmission in our community.  Due to their co-morbid illnesses, this patient is at least at moderate risk for complications without adequate follow up.  This format is felt to be most appropriate for this patient at this time.    Documentation for virtual audio and video telecommunications through [facility] encounter:  The patient was located at home. The provider was located in the office. The patient did consent to this visit and is aware of possible charges through their insurance for this visit.  The other persons participating in this telemedicine service were none. Time spent on call was 20 minutes and in review of previous records 20 minutes total.  This virtual service is not related to other E/M service within previous 7 days.   HPI Chief Complaint  Patient presents with   Covid Positive    VIRTUAL covid positive earlier this am. Symptoms started Tuesday with ST. Wed ran fever all day long and yesterday fever and fatigue. Starting to move into her chest.    Virtual for illness.  Symptoms x 3 days, had fever the last 2 days.  She notes fatigue, some cough, sore throat, some sneezing.   No NVD, no NVD.  Feels a little SOB.  Has some headache.  Using some dayquil and nyquil.   Using Trelegy regularly the last 59mo per allergist.  Has inhaler but no currently using it.  Tested positive for covid this week.  No other aggravating or relieving factors. No other complaint.    Past Medical History:  Diagnosis Date   Abnormal uterine bleeding (AUB)    march/april 2021   Allergy    Angio-edema    Asthma    Cancer    skin cancer removed    Cough    non prod   Elevated BP without diagnosis of  hypertension    high with DOCTOR  visits , then goes back to normal    Family history of breast cancer    MGM   Family history of pancreatic cancer    Family history of prostate cancer    Fatigue    Generalized headaches    migraines on occasion.   GERD (gastroesophageal reflux disease)    Graves disease 03/2010   Heart murmur    History of COVID-19 06/26/2020   Hyperlipidemia    Hypothyroidism    s/p thyroidectomy for Grave's disease [facility]   IBS (irritable bowel syndrome)    Incontinence    Infertility, female    Migraine    without aura   Plantar fasciitis, bilateral 2021   Pre-diabetes    Recurrent upper respiratory infection (URI)    last uri dec 2020   Sinus problem    runny nose   Sleep apnea    mild no cpap needed    Uterine polyp    Current Outpatient Medications on File Prior to Visit  Medication Sig Dispense Refill   Calcium-Vitamins C & D (CALCIUM/C/D PO) Take by mouth.     cholecalciferol (VITAMIN D3) 25 MCG (1000 UNIT) tablet Take 1,000 Units by mouth daily.     cyanocobalamin (VITAMIN B12) 1000 MCG tablet Take 1,000 mcg by mouth daily.  famotidine (PEPCID) 40 MG tablet Take 1 tablet (40 mg total) by mouth 2 (two) times daily. 60 tablet 5   Fexofenadine HCl (ALLEGRA PO) Take by mouth.     levocetirizine (XYZAL) 5 MG tablet TAKE ONE TABLET BY MOUTH EVERY EVENING 30 tablet 5   Pseudoephedrine-APAP-DM (DAYQUIL PO) Take 2 tablets by mouth as needed.     Spacer/Aero-Holding [facility] DIAMOND) DEVI Use as directed with inhaler. Dx:  J45.40 Asthma (Patient taking differently: Use as directed with inhaler. Dx:  J45.40 Asthma) 1 Device 2   thyroid (NP THYROID) 90 MG tablet Take 1 tablet (90 mg total) by mouth in the morning and at bedtime. 60 tablet 1   albuterol (VENTOLIN HFA) 108 (90 Base) MCG/ACT inhaler Inhale 2 puffs into the lungs every 4 (four) hours as needed for wheezing or shortness of breath. (Patient not  taking: Reported on 03/05/2023) 8 g 0   docusate sodium (COLACE) 100 MG capsule Take 1 capsule (100 mg total) by mouth 2 (two) times daily. (Patient not taking: Reported on 03/05/2023) 60 capsule 2   Fluticasone Propionate (XHANCE) 93 MCG/ACT EXHU Place 1 spray per nostril twice daily (Patient not taking: Reported on 03/05/2023) 16 mL 5   methocarbamol (ROBAXIN-750) 750 MG tablet Take 1 tablet (750 mg total) by mouth 4 (four) times daily. (Patient not taking: Reported on 03/05/2023) 120 tablet 1   oxyCODONE (ROXICODONE) 5 MG immediate release tablet Take 1 tablet (5 mg total) by mouth every 4 (four) hours as needed. (Patient not taking: Reported on 03/05/2023) 20 tablet 0   [DISCONTINUED] metFORMIN (GLUCOPHAGE-XR) 500 MG 24 hr tablet Take 2 tablets (1,000 mg total) by mouth daily with breakfast. 1 tablet 0   No current facility-administered medications on file prior to visit.     Review of Systems As in subjective    Objective:   Physical Exam Due to coronavirus pandemic stay at home measures, patient visit was virtual and they were not examined in person.   Temp 97.9 F (36.6 C) (Temporal)   Ht 5' 1.5" (1.562 m)   Wt 175 lb (79.4 kg)   LMP 09/30/2016 Comment: aub 2021  BMI 32.53 kg/m   Gen: wd, wn, nad No labored breathing or wheezing     Assessment:     Encounter Diagnoses  Name Primary?   COVID Yes   Cough, unspecified type    Moderate persistent asthma with acute exacerbation        Plan:     We discussed symptoms and concerns.  Advised rest, hydration, continue DayQuil NyQuil as needed, continue Tylenol as needed for headache or not feeling well.  Begin Paxlovid.  Discussed risk and benefits of medication.  She has had COVID before but never took Paxlovid.  Advise she go ahead and increase her inhaler use the next few days of albuterol.  Continue Trelegy as usual.  If much worse over the next few days then get reevaluated.  We discussed quarantine recommendations.  Jacqueline Holmes was  seen today for covid positive.  Diagnoses and all orders for this visit:  COVID  Cough, unspecified type  Moderate persistent asthma with acute exacerbation  Other orders -     nirmatrelvir/ritonavir (PAXLOVID) 20 x 150 MG & 10 x 100MG  TABS; Take 3 tablets by mouth 2 (two) times daily for 5 days. (Take nirmatrelvir 150 mg two tablets twice daily for 5 days and ritonavir 100 mg one tablet twice daily for 5 days) Patient GFR is normal  F/u  prn

## 2023-03-09 ENCOUNTER — Ambulatory Visit: Payer: BC Managed Care – PPO | Admitting: Allergy & Immunology

## 2023-03-09 ENCOUNTER — Other Ambulatory Visit: Payer: Self-pay

## 2023-03-09 VITALS — BP 150/100 | HR 93 | Temp 98.2°F | Resp 16 | Wt 179.6 lb

## 2023-03-09 DIAGNOSIS — J302 Other seasonal allergic rhinitis: Secondary | ICD-10-CM

## 2023-03-09 DIAGNOSIS — U071 COVID-19: Secondary | ICD-10-CM

## 2023-03-09 DIAGNOSIS — J4540 Moderate persistent asthma, uncomplicated: Secondary | ICD-10-CM

## 2023-03-09 DIAGNOSIS — K219 Gastro-esophageal reflux disease without esophagitis: Secondary | ICD-10-CM

## 2023-03-09 DIAGNOSIS — J3089 Other allergic rhinitis: Secondary | ICD-10-CM

## 2023-03-09 MED ORDER — FAMOTIDINE 40 MG PO TABS: 40.0000 mg | ORAL_TABLET | Freq: Two times a day (BID) | ORAL | 1 refills | Status: DC

## 2023-03-09 MED ORDER — LEVOCETIRIZINE DIHYDROCHLORIDE 5 MG PO TABS: 5.0000 mg | ORAL_TABLET | Freq: Every evening | ORAL | 1 refills | Status: DC

## 2023-03-09 MED ORDER — TRELEGY ELLIPTA 100-62.5-25 MCG/ACT IN AEPB: 1.0000 | INHALATION_SPRAY | Freq: Every day | RESPIRATORY_TRACT | 1 refills | Status: DC

## 2023-03-09 MED ORDER — ALBUTEROL SULFATE HFA 108 (90 BASE) MCG/ACT IN AERS: 2.0000 | INHALATION_SPRAY | RESPIRATORY_TRACT | 0 refills | Status: DC | PRN

## 2023-03-09 NOTE — Progress Notes (Unsigned)
FOLLOW UP  Date of Service/Encounter:  03/09/23   Assessment:   Moderate persistent asthma, uncomplicated - without adequate control0   Non-seasonal allergic rhinitis (trees, weeds, grasses, molds, dust mites, cat, dog and cockroach) - on allergen immunotherapy    Adverse food reaction (shellfish/fish) - with negative testing   GERD - starting higher dose famotidine BID    Plan/Recommendations:   1. Moderate persistent asthma without complication - Lung testing not done due to the recent COVID19 infection.  - We are not going to make any medication changes.  - Daily controller medication(s): Trelegy 100/62.5/25 one puff once daily - Prior to physical activity: albuterol 2 puffs 10-15 minutes before physical activity. - Rescue medications: albuterol 4 puffs every 4-6 hours as needed - Asthma control goals:  * Full participation in all desired activities (may need albuterol before activity) * Albuterol use two time or less a week on average (not counting use with activity) * Cough interfering with sleep two time or less a month * Oral steroids no more than once a year * No hospitalizations  2. Seasonal and perennial allergic rhinitis - Continue with allergy shots at the same schedule.  - Continue with levocetirizine 5 mg daily. - Continue with Allegra daily.   3. Gastroesophageal reflux disease - Continue with the use of famotidine 40 mg twice daily.  4. Return in about 6 months (around 09/08/2023).   Subjective:   Jacqueline Holmes is a 52 y.o. female presenting today for follow up of  Chief Complaint  Patient presents with   Follow-up    Had COVID last week. Having some left over symptoms. Patient states it is more Asthma related than anything else.     Jacqueline Holmes has a history of the following: Patient Active Problem List   Diagnosis Date Noted   Postoperative hypothyroidism 06/02/2022   Pulmonary nodule, right 06/02/2022   Vitamin B12 deficiency 06/02/2022    Vitamin D deficiency 06/02/2022   Graves disease 05/28/2022   Not well controlled moderate persistent asthma 11/07/2021   Snoring 11/07/2021   Hematuria 04/14/2021   Acute left-sided back pain 04/14/2021   Sebaceous cyst 04/14/2021   Elevated blood pressure reading in office with diagnosis of hypertension 10/22/2020   Belching 10/22/2020   Nausea 10/22/2020   Abdominal pain 10/22/2020   Gastroesophageal reflux disease 10/22/2020   Early satiety 10/22/2020   Seasonal and perennial allergic rhinitis 08/23/2020   Genetic testing 03/05/2020   Family history of prostate cancer    Family history of pancreatic cancer    Cysts of both ovaries 02/09/2020   Allergic conjunctivitis of both eyes 10/06/2019   Elevated ALT measurement 01/28/2018   Moderate persistent asthma with acute exacerbation 01/18/2018   Allergic conjunctivitis 01/18/2018   Elevated blood-pressure reading without diagnosis of hypertension 01/18/2018   Non-seasonal allergic rhinitis due to pollen 05/03/2017   Mild persistent asthma, uncomplicated 05/03/2017   Thyroid nodule, uninodular 09/28/2011   Unspecified hypothyroidism 09/10/2011   Allergic rhinitis 09/10/2011   Asthma 04/25/2008   Contact dermatitis and other eczema due to plants (except food) 08/04/2007    History obtained from: chart review and patient.  Jacqueline Holmes is a 52 y.o. female presenting for a follow up visit.  She was last seen in March 2023.  At that time, we did not do lung testing because she had a recent surgery.  We tried Trelegy 1 puff once daily to see if this helped anymore.  For her rhinitis, she continued with  allergy shots as well as Xhance, levocetirizine, and Allegra.  For her GERD, she continue with famotidine twice daily.  We did increase it to 40 mg.  Since last visit, she has done well. She did have COVID 19 last week and has been afebrile since Thursday. She was coming back from her trip and she slept all day. She mainly felt tired and had  a fever.  She did get Paxlovid and she thinks that it might have helped. They just returned from a [employer] [address] in [place]. She went with her parents who apparently caused a scene by complaining that no one knew [place]. Shew was rather embarrassed.   Asthma/Respiratory Symptom History: She does have some tightness in her chest from the COVID which always causes her asthma to act up. She remains on the Trelegy and this seems to be affordable around $30. She was sick over a year ago, current COVID aside.  She is stable but not better. She gets prednisone 1-2 times per year. She doesn ot use her albuterol too frequently. She has not been to the hospital nor has she needed to be hospitalized for her symptoms. She just does not feel as well as she could feel. She never had days that go by without her remembering that she has asthma. She is open to a biologic addition to her treatment regimen. She would like to be able to do more.   She did heal from the cyst removal in her back. She has done very well with this. She had no postop complications.  Allergic Rhinitis Symptom History: She has not been using her Timmothy Sours because she was getting swelling ironically from this. This happens with every nose spray. She remains on the levocetirizine and Allegra. This is working well for her.   Timothy is on allergen immunotherapy. She receives two injections. Immunotherapy script #1 contains weeds, grasses, dust mites, and cat. She currently receives 0.33mL of the RED vial (1/100). Immunotherapy script #2 contains molds and cockroach. She currently receives 0.45mL of the RED vial (1/100). She started shots August of 2018 and reached maintenance in June of 2019. She has done well with her allergies. She is not having any large local reactions with her current set of shots at this point.   GERD Symptom History: Reflux has been well controlled. She has been using her higher dose famotidine with improvement in her symptoms.   She feels that this higher dosing is working well to control her symptoms.   Otherwise, there have been no changes to her past medical history, surgical history, family history, or social history.    Review of Systems  Constitutional: Negative.  Negative for chills, fever, malaise/fatigue and weight loss.  HENT:  Positive for congestion. Negative for ear discharge, ear pain and sinus pain.        Positive for snoring.   Eyes:  Negative for pain, discharge and redness.  Respiratory:  Positive for cough and shortness of breath. Negative for sputum production and wheezing.   Cardiovascular: Negative.  Negative for chest pain and palpitations.  Gastrointestinal:  Negative for abdominal pain, constipation, diarrhea, heartburn, nausea and vomiting.  Skin: Negative.  Negative for itching and rash.  Neurological:  Negative for dizziness and headaches.  Endo/Heme/Allergies:  Positive for environmental allergies. Does not bruise/bleed easily.       Objective:   Blood pressure (!) 150/100, pulse 93, temperature 98.2 F (36.8 C), temperature source Temporal, resp. rate 16, weight 179 lb 9.6 oz (  81.5 kg), last menstrual period 09/30/2016, SpO2 97 %. Body mass index is 33.39 kg/m.    Physical Exam Vitals reviewed.  Constitutional:      Appearance: She is well-developed.  HENT:     Head: Normocephalic and atraumatic.     Right Ear: Tympanic membrane, ear canal and external ear normal.     Left Ear: Tympanic membrane, ear canal and external ear normal.     Nose: No nasal deformity, septal deviation, mucosal edema or rhinorrhea.     Right Turbinates: Enlarged, swollen and pale.     Left Turbinates: Enlarged, swollen and pale.     Right Sinus: No maxillary sinus tenderness or frontal sinus tenderness.     Left Sinus: No maxillary sinus tenderness or frontal sinus tenderness.     Comments: Turbinates are looking better today. They are no overly enlarged.     Mouth/Throat:     Mouth: Mucous  membranes are not pale and not dry.     Pharynx: Uvula midline.     Comments: Cobblestoning in the posterior oropharynx.  Eyes:     General: Lids are normal. No allergic shiner.       Right eye: No discharge.        Left eye: No discharge.     Conjunctiva/sclera: Conjunctivae normal.     Right eye: Right conjunctiva is not injected. No chemosis.    Left eye: Left conjunctiva is not injected. No chemosis.    Pupils: Pupils are equal, round, and reactive to light.  Cardiovascular:     Rate and Rhythm: Normal rate and regular rhythm.     Heart sounds: Normal heart sounds.  Pulmonary:     Effort: Pulmonary effort is normal. No tachypnea, accessory muscle usage or respiratory distress.     Breath sounds: Normal breath sounds. No wheezing, rhonchi or rales.     Comments: Decreased air movement at the bases.  Chest:     Chest wall: No tenderness.  Lymphadenopathy:     Cervical: No cervical adenopathy.  Skin:    Coloration: Skin is not pale.     Findings: No abrasion, erythema, petechiae or rash. Rash is not papular, urticarial or vesicular.  Neurological:     Mental Status: She is alert.  Psychiatric:        Behavior: Behavior is cooperative.      Diagnostic studies: none due to recent COVID19 infection        Malachi BondsJoel Tyland Klemens, MD  [facility]

## 2023-03-09 NOTE — Patient Instructions (Addendum)
1. Moderate persistent asthma without complication - Lung testing not done due to the recent COVID19 infection.  - We are not going to make any medication changes.  - Daily controller medication(s): Trelegy 100/62.5/25 one puff once daily - Prior to physical activity: albuterol 2 puffs 10-15 minutes before physical activity. - Rescue medications: albuterol 4 puffs every 4-6 hours as needed - Asthma control goals:  * Full participation in all desired activities (may need albuterol before activity) * Albuterol use two time or less a week on average (not counting use with activity) * Cough interfering with sleep two time or less a month * Oral steroids no more than once a year * No hospitalizations  2. Seasonal and perennial allergic rhinitis - Continue with allergy shots at the same schedule.  - Continue with levocetirizine 5 mg daily. - Continue with Allegra daily.   3. Gastroesophageal reflux disease - Continue with the use of famotidine 40 mg twice daily.  4. Return in about 6 months (around 09/08/2023).    Please inform us of any Emergency Department visits, hospitalizations, or changes in symptoms. Call us before going to the ED for breathing or allergy symptoms since we might be able to fit you in for a sick visit. Feel free to contact us anytime with any questions, problems, or concerns.  It was a pleasure to see you again today!  Websites that have reliable patient information: 1. American Academy of Asthma, Allergy, and Immunology: www.aaaai.org 2. Food Allergy Research and Education (FARE): foodallergy.org 3. Mothers of Asthmatics: http://www.asthmacommunitynetwork.org 4. American College of Allergy, Asthma, and Immunology: www.acaai.org   COVID-19 Vaccine Information can be found at: [email or web address] For questions related to vaccine distribution or appointments, please email [email or web address] or call  [phone].   We realize that you might be concerned about having an allergic reaction to the COVID19 vaccines. To help with that concern, WE ARE OFFERING THE COVID19 VACCINES IN OUR OFFICE! Ask the front desk for dates!     "Like" [place] on Facebook and Instagram for our latest updates!      A healthy democracy works best when [employer] participate! Make sure you are registered to vote! If you have moved or changed any of your contact information, you will need to get this updated before voting!  In some cases, you MAY be able to register to vote online: [email or web address]

## 2023-03-10 ENCOUNTER — Encounter: Payer: Self-pay | Admitting: Allergy & Immunology

## 2023-03-16 ENCOUNTER — Ambulatory Visit (INDEPENDENT_AMBULATORY_CARE_PROVIDER_SITE_OTHER): Payer: BC Managed Care – PPO

## 2023-03-16 DIAGNOSIS — J309 Allergic rhinitis, unspecified: Secondary | ICD-10-CM | POA: Diagnosis not present

## 2023-03-18 ENCOUNTER — Telehealth: Payer: Self-pay | Admitting: *Deleted

## 2023-03-18 NOTE — Telephone Encounter (Signed)
Called patient and advised approval, copay card and submit to [employer] for Tezspire. Patient to start in clinic and may switch to home after.

## 2023-03-18 NOTE — Telephone Encounter (Signed)
-----   Message from Alfonse Spruce, MD sent at 03/09/2023  4:14 PM EDT ----- Dorothea Ogle?

## 2023-03-19 NOTE — Telephone Encounter (Signed)
Great - thanks

## 2023-03-30 ENCOUNTER — Ambulatory Visit (INDEPENDENT_AMBULATORY_CARE_PROVIDER_SITE_OTHER): Payer: BC Managed Care – PPO

## 2023-03-30 DIAGNOSIS — J309 Allergic rhinitis, unspecified: Secondary | ICD-10-CM

## 2023-04-06 ENCOUNTER — Other Ambulatory Visit: Payer: Self-pay | Admitting: Family Medicine

## 2023-04-07 ENCOUNTER — Encounter: Payer: Self-pay | Admitting: Family Medicine

## 2023-04-07 ENCOUNTER — Ambulatory Visit: Payer: BC Managed Care – PPO | Admitting: Family Medicine

## 2023-04-07 VITALS — BP 138/88 | HR 80 | Temp 97.7°F | Ht 61.0 in | Wt 178.6 lb

## 2023-04-07 DIAGNOSIS — J302 Other seasonal allergic rhinitis: Secondary | ICD-10-CM | POA: Diagnosis not present

## 2023-04-07 DIAGNOSIS — H6121 Impacted cerumen, right ear: Secondary | ICD-10-CM | POA: Diagnosis not present

## 2023-04-07 DIAGNOSIS — H6691 Otitis media, unspecified, right ear: Secondary | ICD-10-CM

## 2023-04-07 MED ORDER — AZELASTINE HCL 0.1 % NA SOLN
2.0000 | Freq: Two times a day (BID) | NASAL | 5 refills | Status: DC
Start: 2023-04-07 — End: 2024-02-24

## 2023-04-07 NOTE — Patient Instructions (Signed)
Stay well hydrated. Consider using Mucinex 12 hour twice daily. Restart a nasal steroid--remembering to aim away from the center, and using nasal saline as needed. I'm sending in a prescription for astelin, which may also help with the allergies/congestion.  It appears that the infection has improved--there is still some fluid, but the eardrum isn't red, and the fluid seems clear. If you develop fever or worsening ear pain, we may need to do another round of antibiotic. Doreatha Hickel out what you have at home.

## 2023-04-07 NOTE — Progress Notes (Signed)
Chief Complaint  Patient presents with   Ear Fullness    Went to [facility] Sat and is taking 875 Augmentin, has one more dose left. Still has some pressure in her right ear and occasional pain. Yesterday she has pain on entire right side of face/sinuses, somewhat less today.    Over a week ago she noted pressure in both ears, thought it was wax buildup. She tried Debrox, but it didn't help. The right ear kept getting worse. Now she feels like she has a marble stuck in her ear. Only has pain with certain position changes (when she gets up after leaning forwards)  She has chronic allergies, feels congested now. Denies discolored mucus. Yesterday she had some discomfort at her R cheek, but thinks it radiated from her ear (rather than sinus radiating to the ear).  Denies any sinus pain today.  Has previously used flonase and astepro.  Thought she had some at home, but didn't. She reports that all nasal steroids eventually irritate her sinuses, so she doesn't use them regularly. Hasn't used any recently. Has a nasal steroid at home, cannot recall which one. Urgent care recommended she restart nasal steroid and astepro.  She was started on Augmentin for diagnosis or R otitis media at [facility] on 5/4, given 5 day course. She is only marginally better, presents for recheck.  Asthma has been flaring some as well, feels chest congestion which responds to albuterol.  PMH, PSH, SH reviewed  Outpatient Encounter Medications as of 04/07/2023  Medication Sig Note   albuterol (VENTOLIN HFA) 108 (90 Base) MCG/ACT inhaler Inhale 2 puffs into the lungs every 4 (four) hours as needed for wheezing or shortness of breath. 04/07/2023: Last used today, has been using often for last several days   amoxicillin-clavulanate (AUGMENTIN) 875-125 MG tablet Take 1 tablet by mouth 2 (two) times daily. 04/07/2023: Has one dose left   Calcium Carb-Cholecalciferol (CALCIUM PLUS VITAMIN D3 PO) Take 1 capsule by mouth daily.    cholecalciferol  (VITAMIN D3) 25 MCG (1000 UNIT) tablet Take 1,000 Units by mouth daily.    cyanocobalamin (VITAMIN B12) 1000 MCG tablet Take 1,000 mcg by mouth daily.    famotidine (PEPCID) 40 MG tablet Take 1 tablet (40 mg total) by mouth 2 (two) times daily.    Fexofenadine HCl (ALLEGRA PO) Take by mouth.    Fluticasone-Umeclidin-Vilant (TRELEGY ELLIPTA) 100-62.5-25 MCG/ACT AEPB Inhale 1 puff into the lungs daily.    levocetirizine (XYZAL) 5 MG tablet Take 1 tablet (5 mg total) by mouth every evening. TAKE ONE TABLET BY MOUTH EVERY EVENING    thyroid (NP THYROID) 90 MG tablet Take 1 tablet (90 mg total) by mouth in the morning and at bedtime. 06/03/2022: Takes 1 in the morning, 1/2 tablet in the afternoon   [DISCONTINUED] Calcium-Vitamins C & D (CALCIUM/C/D PO) Take by mouth.    TEZSPIRE 210 MG/1. SOAJ Inject into the skin. (Patient not taking: Reported on 04/07/2023) 04/07/2023: Has not yet started   [DISCONTINUED] metFORMIN (GLUCOPHAGE-XR) 500 MG 24 hr tablet Take 2 tablets (1,000 mg total) by mouth daily with breakfast.    [DISCONTINUED] Pseudoephedrine-APAP-DM (DAYQUIL PO) Take 2 tablets by mouth as needed. 03/05/2023: Took 2 at 8:00am   No facility-administered encounter medications on file as of 04/07/2023.   Allergies  Allergen Reactions   B-12 [Cyanocobalamin] Itching    Generalized itching following 03/05/22 IM injection. No benefit from Benadryl. Started steroid taper   ROS:  ear pain per HPI. No f/c, no n/v/d.  She has some chest tightness, responds to her inhaler (known asthma) Worse when laying down at night See HPI   PHYSICAL EXAM:  BP 138/88   Pulse 80   Temp 97.7 F (36.5 C) (Tympanic)   Ht 5\' 1"  (1.549 m)   Wt 178 lb 9.6 oz (81 kg)   LMP 09/30/2016 Comment: aub 2021  BMI 33.75 kg/m   Well-appearing, pleasant female in no distress HEENT: conjunctiva and sclera are clear, EOMI.  Nasal mucosa with mild edema, stringy white mucus, somewhat thick, especially on the left.  Sinuses are  nontender. OP is clear. L TM and EAC normal R TM--no erythema, partly visualized (cerumen blocking partially), visualized canal appears normal. Cerumen removed with curette, TM better visualized, light reflex is diffuse.  No erythema Neck: no lymphadenopathy, thyromegaly or mass Heart: regular rate and rhythm, no murmur Lungs: Somewhat coarse BS posteriorly, no wheezing, clear anteriorly. Good air movement Extremities: no edema Psych: normal mood, affect, hygiene and grooming Neuro: alert and oriented, cranial nerves grossly intact, normal gait.   ASSESSMENT/PLAN:  Seasonal allergic rhinitis, unspecified trigger - discussed resuming nasal steroid, proper technique.  Start astelin. Cont oral antihistamines - Plan: azelastine (ASTELIN) 0.1 % nasal spray  Right otitis media, unspecified otitis media type - treated with ABX, no e/o acute infection now, just residual fluid (serous otitis). Complete ABX. Treat allergies  Impacted cerumen of right ear - cerumen removed with curette by MD in order to adequately assess TM given her complaint of ear pain.    Stay well hydrated. Consider using Mucinex 12 hour twice daily. Restart a nasal steroid--remembering to aim away from the center, and using nasal saline as needed. I'm sending in a prescription for astelin, which may also help with the allergies/congestion.  It appears that the infection has improved--there is still some fluid, but the eardrum isn't red, and the fluid seems clear. If you develop fever or worsening ear pain, we may need to do another round of antibiotic. Doreatha Eager out what you have at home.   Pt to f/u if sx persist or worsen.

## 2023-04-13 ENCOUNTER — Ambulatory Visit (INDEPENDENT_AMBULATORY_CARE_PROVIDER_SITE_OTHER): Payer: BC Managed Care – PPO

## 2023-04-13 DIAGNOSIS — J4550 Severe persistent asthma, uncomplicated: Secondary | ICD-10-CM | POA: Diagnosis not present

## 2023-04-13 MED ORDER — TEZEPELUMAB-EKKO 210 MG/1.91ML ~~LOC~~ SOSY
210.0000 mg | PREFILLED_SYRINGE | SUBCUTANEOUS | Status: AC
  Administered 2023-04-13 – 2023-06-15 (×3): 210 mg via SUBCUTANEOUS

## 2023-04-13 NOTE — Progress Notes (Signed)
Immunotherapy   Patient Details  Name: Jacqueline Holmes MRN: [id number] Date of Birth: [birth year]  04/13/2023  [name] started injections for   Frequency: every 28 days Epi-Pen:Epi-Pen Available  Consent signed and patient instructions given.   [name] 04/13/2023, 2:35 PM

## 2023-04-15 ENCOUNTER — Ambulatory Visit (INDEPENDENT_AMBULATORY_CARE_PROVIDER_SITE_OTHER): Payer: BC Managed Care – PPO

## 2023-04-15 DIAGNOSIS — J309 Allergic rhinitis, unspecified: Secondary | ICD-10-CM

## 2023-04-16 ENCOUNTER — Telehealth: Payer: Self-pay | Admitting: Family Medicine

## 2023-04-16 NOTE — Telephone Encounter (Signed)
Pt called and states that she has finished the medication for her ear infection and her ear is not any better. Pt uses [facility] and can be reached at [phone].

## 2023-04-16 NOTE — Telephone Encounter (Signed)
I believe she only had 1 dose of antibiotic left when I saw her 5/8, and saw residual fluid (effusion), but no infection.  I recommend re-evaluation if having persistent pain--to see if she truly needs another antibiotic (if it looks like the infection has recurred), vs needing more time for th fluid to resolve if no evidence of a new infection.  If clearly having new/worsening pain, and/or fever, we can send in another antibiotic and have her return for recheck if not better. But if not much worse, just not better, I recommend re-evaluation.

## 2023-04-20 DIAGNOSIS — J3081 Allergic rhinitis due to animal (cat) (dog) hair and dander: Secondary | ICD-10-CM | POA: Diagnosis not present

## 2023-04-20 NOTE — Progress Notes (Signed)
VIALS EXP 04-19-24 

## 2023-04-20 NOTE — Progress Notes (Unsigned)
No chief complaint on file.  She was seen 5/8 with complaint of ear pain.  She had bilateral ear discomfort which she thought was due to wax, didn't improve with Debrox.  Right ear felt worse, like she had a marble stuck in her ear.  Pain occurred with certain positions (getting up, leaning forwards). She was treated for R otitis media by urgent care, given 5 day course of augmentin on 5/4.  She only had 1 dose left when she was seen last. She was only marginally better at that point. On exam, she had effusion, no erythema.  She has chronic allergies (is on immunotherapy), had reported congestion, but no discolored mucus, no cough, no sinus pain. She hadn't been using flonase or astepro. Was advised by UC to restart these, but hadn't prior to her 5/8 visit here.  Her asthma had been flaring some as well, with some chest congestion that responded to albuterol.  She was advised to complete the antibiotic, start nasal steroid and astelin nasal spray. We had discussed mucinex.  She contacted [place] on 5/17 reporting that her ear wasn't better. She presents today for re-evaluation.     PMH, PSH, SH reviewed    ROS:  ear pain per HPI. No f/c, no n/v/d. She has some chest tightness, responds to her inhaler (known asthma) Worse when laying down at night See HPI   PHYSICAL EXAM:  LMP 09/30/2016 Comment: aub 2021  Well-appearing, pleasant female in no distress HEENT: conjunctiva and sclera are clear, EOMI.  Nasal mucosa with mild edema, stringy white mucus, somewhat thick, especially on the left.  Sinuses are nontender. OP is clear. L TM and EAC normal R TM--no erythema, partly visualized (cerumen blocking partially), visualized canal appears normal. Cerumen removed with curette, TM better visualized, light reflex is diffuse.  No erythema Neck: no lymphadenopathy, thyromegaly or mass Heart: regular rate and rhythm, no murmur Lungs: Somewhat coarse BS posteriorly, no wheezing, clear  anteriorly. Good air movement Extremities: no edema Psych: normal mood, affect, hygiene and grooming Neuro: alert and oriented, cranial nerves grossly intact, normal gait.  UPDATE ALL    ASSESSMENT/PLAN:   Cerumen impaction?  If so, may need to treat

## 2023-04-21 ENCOUNTER — Encounter: Payer: Self-pay | Admitting: Family Medicine

## 2023-04-21 ENCOUNTER — Ambulatory Visit: Payer: BC Managed Care – PPO | Admitting: Family Medicine

## 2023-04-21 VITALS — BP 130/80 | HR 92 | Temp 97.1°F | Wt 180.0 lb

## 2023-04-21 DIAGNOSIS — H6591 Unspecified nonsuppurative otitis media, right ear: Secondary | ICD-10-CM

## 2023-04-21 DIAGNOSIS — J302 Other seasonal allergic rhinitis: Secondary | ICD-10-CM

## 2023-04-21 MED ORDER — PREDNISONE 10 MG (21) PO TBPK
ORAL_TABLET | ORAL | 0 refills | Status: DC
Start: 2023-04-21 — End: 2023-08-12

## 2023-04-21 NOTE — Patient Instructions (Addendum)
  You do not have an ear infection. You still have the fluid on the right ear. Given that this is still quite bothersome, I recommend adding sudafed to use as needed with ear discomfort, and we will try a course of steroids to see if this helps. If you develop acutely worsening pain or fever, that could indicate an infection that would need treatment.  Hopefully this gets you completely better. If still having some plugging issues when you are flying, use either sudafed or afrin spray before the takeoff.

## 2023-05-04 ENCOUNTER — Ambulatory Visit (INDEPENDENT_AMBULATORY_CARE_PROVIDER_SITE_OTHER): Payer: BC Managed Care – PPO | Admitting: *Deleted

## 2023-05-04 DIAGNOSIS — J309 Allergic rhinitis, unspecified: Secondary | ICD-10-CM

## 2023-05-11 ENCOUNTER — Ambulatory Visit (INDEPENDENT_AMBULATORY_CARE_PROVIDER_SITE_OTHER): Payer: BC Managed Care – PPO

## 2023-05-11 DIAGNOSIS — J4550 Severe persistent asthma, uncomplicated: Secondary | ICD-10-CM

## 2023-05-19 ENCOUNTER — Ambulatory Visit (INDEPENDENT_AMBULATORY_CARE_PROVIDER_SITE_OTHER): Payer: BC Managed Care – PPO

## 2023-05-19 DIAGNOSIS — J309 Allergic rhinitis, unspecified: Secondary | ICD-10-CM

## 2023-06-02 ENCOUNTER — Ambulatory Visit (INDEPENDENT_AMBULATORY_CARE_PROVIDER_SITE_OTHER): Payer: BC Managed Care – PPO | Admitting: *Deleted

## 2023-06-02 DIAGNOSIS — J309 Allergic rhinitis, unspecified: Secondary | ICD-10-CM | POA: Diagnosis not present

## 2023-06-15 ENCOUNTER — Ambulatory Visit: Payer: BC Managed Care – PPO

## 2023-06-15 DIAGNOSIS — J4550 Severe persistent asthma, uncomplicated: Secondary | ICD-10-CM

## 2023-06-15 NOTE — Progress Notes (Signed)
Immunotherapy   Patient Details  Name: Jacqueline Holmes MRN: [id number] Date of Birth: 1971-09-07  06/15/2023  [name] here to self administer Tezspire. Patient was instructed on how to self administer and demonstrated correct administration. Patient will continue self administration at home.   Frequency: every 28 days Epi-Pen:Epi-Pen Available  Consent signed and patient instructions given.   [name] 06/15/2023, 2:55 PM

## 2023-06-29 ENCOUNTER — Ambulatory Visit (INDEPENDENT_AMBULATORY_CARE_PROVIDER_SITE_OTHER): Payer: BC Managed Care – PPO

## 2023-06-29 DIAGNOSIS — J309 Allergic rhinitis, unspecified: Secondary | ICD-10-CM | POA: Diagnosis not present

## 2023-07-06 ENCOUNTER — Ambulatory Visit (INDEPENDENT_AMBULATORY_CARE_PROVIDER_SITE_OTHER): Payer: BC Managed Care – PPO

## 2023-07-06 DIAGNOSIS — J309 Allergic rhinitis, unspecified: Secondary | ICD-10-CM | POA: Diagnosis not present

## 2023-07-11 ENCOUNTER — Other Ambulatory Visit: Payer: Self-pay | Admitting: Allergy & Immunology

## 2023-07-29 ENCOUNTER — Ambulatory Visit (INDEPENDENT_AMBULATORY_CARE_PROVIDER_SITE_OTHER): Payer: BC Managed Care – PPO

## 2023-07-29 DIAGNOSIS — J309 Allergic rhinitis, unspecified: Secondary | ICD-10-CM

## 2023-08-05 ENCOUNTER — Ambulatory Visit (INDEPENDENT_AMBULATORY_CARE_PROVIDER_SITE_OTHER): Payer: BC Managed Care – PPO

## 2023-08-05 ENCOUNTER — Other Ambulatory Visit: Payer: Self-pay | Admitting: Oncology

## 2023-08-05 DIAGNOSIS — Z006 Encounter for examination for normal comparison and control in clinical research program: Secondary | ICD-10-CM

## 2023-08-05 DIAGNOSIS — J309 Allergic rhinitis, unspecified: Secondary | ICD-10-CM

## 2023-08-12 ENCOUNTER — Encounter: Payer: Self-pay | Admitting: Family Medicine

## 2023-08-12 ENCOUNTER — Other Ambulatory Visit: Payer: Self-pay

## 2023-08-12 ENCOUNTER — Ambulatory Visit: Payer: BC Managed Care – PPO | Admitting: Family Medicine

## 2023-08-12 VITALS — BP 108/78 | HR 115 | Temp 98.3°F | Resp 18

## 2023-08-12 DIAGNOSIS — K219 Gastro-esophageal reflux disease without esophagitis: Secondary | ICD-10-CM | POA: Diagnosis not present

## 2023-08-12 DIAGNOSIS — J302 Other seasonal allergic rhinitis: Secondary | ICD-10-CM | POA: Diagnosis not present

## 2023-08-12 DIAGNOSIS — J3089 Other allergic rhinitis: Secondary | ICD-10-CM | POA: Diagnosis not present

## 2023-08-12 DIAGNOSIS — J4550 Severe persistent asthma, uncomplicated: Secondary | ICD-10-CM

## 2023-08-12 MED ORDER — TRELEGY ELLIPTA 100-62.5-25 MCG/ACT IN AEPB: 1.0000 | INHALATION_SPRAY | Freq: Every day | RESPIRATORY_TRACT | 1 refills | Status: DC

## 2023-08-12 MED ORDER — FAMOTIDINE 40 MG PO TABS: 40.0000 mg | ORAL_TABLET | Freq: Two times a day (BID) | ORAL | 1 refills | Status: DC

## 2023-08-12 MED ORDER — EPINEPHRINE 0.3 MG/0.3ML IJ SOAJ: 0.3000 mg | INTRAMUSCULAR | 1 refills | Status: DC | PRN

## 2023-08-12 MED ORDER — ALBUTEROL SULFATE HFA 108 (90 BASE) MCG/ACT IN AERS: 2.0000 | INHALATION_SPRAY | RESPIRATORY_TRACT | 1 refills | Status: AC | PRN

## 2023-08-12 NOTE — Patient Instructions (Signed)
Asthma Continue Trelegy 100-1 puff once a day to prevent cough or wheeze Continue albuterol 2 puffs once every 4 hours as needed for cough or wheeze You may use albuterol 2 puffs 5 to 15 minutes before activity to decrease cough or wheeze Continue tezspire injections once every 4 weeks per protocol  Allergic rhinitis Continue allergen avoidance measures directed toward pollen, mold, dust mite, cat, and cockroach as listed below Continue an antihistamine once or twice a day as needed for a runny nose or itch Continue Flonase 2 sprays in each nostril once a day as needed for a stuffy nose Consider saline nasal rinses as needed for nasal symptoms. Use this before any medicated nasal sprays for best result  Reflux Continue dietary and lifestyle modifications as listed below Continue famotidine 20 mg twice a day for reflux control  Call the clinic if this treatment plan is not working well for you.  Follow up in 6 months or sooner if needed.  Reducing Pollen Exposure The American Academy of Allergy, Asthma and Immunology suggests the following steps to reduce your exposure to pollen during allergy seasons. Do not hang sheets or clothing out to dry; pollen may collect on these items. Do not mow lawns or spend time around freshly cut grass; mowing stirs up pollen. Keep windows closed at night.  Keep car windows closed while driving. Minimize morning activities outdoors, a time when pollen counts are usually at their highest. Stay indoors as much as possible when pollen counts or humidity is high and on windy days when pollen tends to remain in the air longer. Use air conditioning when possible.  Many air conditioners have filters that trap the pollen spores. Use a HEPA room air filter to remove pollen form the indoor air you breathe.  Control of Mold Allergen Mold and fungi can grow on a variety of surfaces provided certain temperature and moisture conditions exist.  Outdoor molds grow on  plants, decaying vegetation and soil.  The major outdoor mold, Alternaria and Cladosporium, are found in very high numbers during hot and dry conditions.  Generally, a late Summer - Fall peak is seen for common outdoor fungal spores.  Rain will temporarily lower outdoor mold spore count, but counts rise rapidly when the rainy period ends.  The most important indoor molds are Aspergillus and Penicillium.  Dark, humid and poorly ventilated basements are ideal sites for mold growth.  The next most common sites of mold growth are the bathroom and the kitchen.  Outdoor [employer] Use air conditioning and keep windows closed Avoid exposure to decaying vegetation. Avoid leaf raking. Avoid grain handling. Consider wearing a face mask if working in moldy areas.  Indoor Mold Control Maintain humidity below 50%. Clean washable surfaces with 5% bleach solution. Remove sources e.g. Contaminated carpets.   Control of Dust Mite Allergen Dust mites play a major role in allergic asthma and rhinitis. They occur in environments with high humidity wherever human skin is found. Dust mites absorb humidity from the atmosphere (ie, they do not drink) and feed on organic matter (including shed human and animal skin). Dust mites are a microscopic type of insect that you cannot see with the naked eye. High levels of dust mites have been detected from mattresses, pillows, carpets, upholstered furniture, bed covers, clothes, soft toys and any woven material. The principal allergen of the dust mite is found in its feces. A gram of dust may contain 1,000 mites and 250,000 fecal particles. Mite antigen is easily  measured in the air during house cleaning activities. Dust mites do not bite and do not cause harm to humans, other than by triggering allergies/asthma.  Ways to decrease your exposure to dust mites in your home:  1. Encase mattresses, box springs and pillows with a mite-impermeable barrier or cover  2. Wash sheets,  blankets and drapes weekly in hot water (130 F) with detergent and dry them in a dryer on the hot setting.  3. Have the room cleaned frequently with a vacuum cleaner and a damp dust-mop. For carpeting or rugs, vacuuming with a vacuum cleaner equipped with a high-efficiency particulate air (HEPA) filter. The dust mite allergic individual should not be in a room which is being cleaned and should wait 1 hour after cleaning before going into the room.  4. Do not sleep on upholstered furniture (eg, couches).  5. If possible removing carpeting, upholstered furniture and drapery from the home is ideal. Horizontal blinds should be eliminated in the rooms where the person spends the most time (bedroom, study, television room). Washable vinyl, roller-type shades are optimal.  6. Remove all non-washable stuffed toys from the bedroom. Wash stuffed toys weekly like sheets and blankets above.  7. Reduce indoor humidity to less than 50%. Inexpensive humidity monitors can be purchased at most hardware stores. Do not use a humidifier as can make the problem worse and are not recommended.  Control of Dog or Cat Allergen Avoidance is the best way to manage a dog or cat allergy. If you have a dog or cat and are allergic to dog or cats, consider removing the dog or cat from the home. If you have a dog or cat but don't want to find it a new home, or if your family wants a pet even though someone in the household is allergic, here are some strategies that may help keep symptoms at bay:  Keep the pet out of your bedroom and restrict it to only a few rooms. Be advised that keeping the dog or cat in only one room will not limit the allergens to that room. Don't pet, hug or kiss the dog or cat; if you do, wash your hands with soap and water. High-efficiency particulate air (HEPA) cleaners run continuously in a bedroom or living room can reduce allergen levels over time. Regular use of a high-efficiency vacuum cleaner or a  central vacuum can reduce allergen levels. Giving your dog or cat a bath at least once a week can reduce airborne allergen.  Control of Cockroach Allergen Cockroach allergen has been identified as an important cause of acute attacks of asthma, especially in urban settings.  There are fifty-five species of cockroach that exist in the [place], however only three, the [place], [place] species produce allergen that can affect patients with Asthma.  Allergens can be obtained from fecal particles, egg casings and secretions from cockroaches.    Remove food sources. Reduce access to water. Seal access and entry points. Spray runways with 0.5-1% Diazinon or Chlorpyrifos Blow boric acid power under stoves and refrigerator. Place bait stations (hydramethylnon) at feeding sites.

## 2023-08-12 NOTE — Progress Notes (Signed)
[address] [place] [address] Dept: [phone]  FOLLOW UP NOTE  Patient ID: Jacqueline Holmes, female    DOB: September 05, 1971  Age: 52 y.o. MRN: [id number] Date of Office Visit: 08/12/2023  Assessment  Chief Complaint: Follow-up and Medication Management  HPI Jacqueline Holmes is a 52 year old female who presents to the clinic for follow-up visit.  She was last seen in this clinic on 03/09/2023 by Dr. Dellis Anes for evaluation of asthma, allergic rhinitis on allergen immunotherapy, and reflux.    At today's visit, she reports her asthma has been moderately well controlled with symptoms including shortness of breath with activity and cough occurring daily. She denies wheeze with activity and rest. She continues Trelegy 100-1 puff once a day and last used her albuterol in April. She continues Tezspire injections once every 28 days with no large or local reactions. She reports a moderate relief of asthma symptoms while continuing on Tezspire injections.   Allergic rhinitis is reported as moderately well controlled with symptoms including frequent nasal congestion and post nasal drainage. She continues levocetirizine 5 mg once a day and Allegra 180 mg once a day. She began allergen immunotherapy directed toward mold, cockroach, grass pollen, weed pollen, tree pollen, dust mite, and cat on 07/15/2017. She denies any large or local reactions. She reports a significant improvement in her symptoms of allergic rhinitis while continuing on allergen immunotherapy. EpiPen reordered at today's visit.   Reflux is reported as well controlled with no symptoms of heartburn or vomiting. She continues famotidine twice a day with relief of symptoms.   Her current medications are listed in the chart.   Drug Allergies:  Allergies  Allergen Reactions   B-12 [Cyanocobalamin] Itching    Generalized itching following 03/05/22 IM injection. No benefit from Benadryl. Started steroid taper    Physical Exam: BP 108/78 (BP  Location: Right Arm, Patient Position: Sitting, Cuff Size: Large)   Pulse (!) 115   Temp 98.3 F (36.8 C) (Temporal)   Resp 18   LMP 09/30/2016 Comment: aub 2021  SpO2 98%    Physical Exam Vitals reviewed.  Constitutional:      Appearance: Normal appearance.  HENT:     Head: Normocephalic and atraumatic.     Right Ear: Tympanic membrane normal.     Left Ear: Tympanic membrane normal.     Nose:     Comments: Bilateral nares slightly erythematous with clear nasal drainage noted. Pharynx normal. Eyes normal. Ears normal.     Mouth/Throat:     Pharynx: Oropharynx is clear.  Eyes:     Conjunctiva/sclera: Conjunctivae normal.  Cardiovascular:     Rate and Rhythm: Normal rate and regular rhythm.     Heart sounds: Normal heart sounds. No murmur heard. Pulmonary:     Effort: Pulmonary effort is normal.     Breath sounds: Normal breath sounds.     Comments: Lungs clear to auscultation Musculoskeletal:        General: Normal range of motion.     Cervical back: Normal range of motion and neck supple.  Skin:    General: Skin is warm and dry.  Neurological:     Mental Status: She is alert and oriented to person, place, and time.  Psychiatric:        Mood and Affect: Mood normal.        Behavior: Behavior normal.        Thought Content: Thought content normal.  Judgment: Judgment normal.     Diagnostics: FVC 2.53 which is 83% of predicted value, FEV1 2.15 which is 88% of predicted value.  Spirometry indicates normal ventilatory function.  Assessment and Plan: 1. Severe persistent asthma without complication   2. Seasonal and perennial allergic rhinitis   3. Gastroesophageal reflux disease, unspecified whether esophagitis present     Meds ordered this encounter  Medications   albuterol (VENTOLIN HFA) 108 (90 Base) MCG/ACT inhaler    Sig: Inhale 2 puffs into the lungs every 4 (four) hours as needed for wheezing or shortness of breath.    Dispense:  18 g    Refill:  1    Fluticasone-Umeclidin-Vilant (TRELEGY ELLIPTA) 100-62.5-25 MCG/ACT AEPB    Sig: Inhale 1 puff into the lungs daily.    Dispense:  90 each    Refill:  1   famotidine (PEPCID) 40 MG tablet    Sig: Take 1 tablet (40 mg total) by mouth 2 (two) times daily.    Dispense:  180 tablet    Refill:  1   EPINEPHrine (EPIPEN 2-PAK) 0.3 mg/0.3 mL IJ SOAJ injection    Sig: Inject 0.3 mg into the muscle as needed for anaphylaxis (per injection protocol).    Dispense:  1 each    Refill:  1    Patient Instructions  Asthma Continue Trelegy 100-1 puff once a day to prevent cough or wheeze Continue albuterol 2 puffs once every 4 hours as needed for cough or wheeze You may use albuterol 2 puffs 5 to 15 minutes before activity to decrease cough or wheeze Continue tezspire injections once every 4 weeks per protocol  Allergic rhinitis Continue allergen avoidance measures directed toward pollen, mold, dust mite, cat, and cockroach as listed below Continue an antihistamine once or twice a day as needed for a runny nose or itch Continue Flonase 2 sprays in each nostril once a day as needed for a stuffy nose Consider saline nasal rinses as needed for nasal symptoms. Use this before any medicated nasal sprays for best result  Reflux Continue dietary and lifestyle modifications as listed below Continue famotidine 20 mg twice a day for reflux control  Call the clinic if this treatment plan is not working well for you.  Follow up in 6 months or sooner if needed.   Return in about 6 months (around 02/09/2024), or if symptoms worsen or fail to improve.    Thank you for the opportunity to care for this patient.  Please do not hesitate to contact me with questions.  Thermon Leyland, FNP [facility]

## 2023-08-19 ENCOUNTER — Encounter: Payer: Self-pay | Admitting: Family Medicine

## 2023-08-19 ENCOUNTER — Other Ambulatory Visit: Payer: Self-pay | Admitting: *Deleted

## 2023-08-19 ENCOUNTER — Telehealth: Payer: BC Managed Care – PPO | Admitting: Family Medicine

## 2023-08-19 ENCOUNTER — Other Ambulatory Visit (INDEPENDENT_AMBULATORY_CARE_PROVIDER_SITE_OTHER): Payer: BC Managed Care – PPO

## 2023-08-19 VITALS — Temp 98.0°F | Ht 61.0 in | Wt 176.0 lb

## 2023-08-19 DIAGNOSIS — R059 Cough, unspecified: Secondary | ICD-10-CM | POA: Diagnosis not present

## 2023-08-19 DIAGNOSIS — R0989 Other specified symptoms and signs involving the circulatory and respiratory systems: Secondary | ICD-10-CM | POA: Diagnosis not present

## 2023-08-19 DIAGNOSIS — U071 COVID-19: Secondary | ICD-10-CM | POA: Diagnosis not present

## 2023-08-19 LAB — POC COVID19 BINAXNOW: SARS Coronavirus 2 Ag: POSITIVE — AB

## 2023-08-19 LAB — POCT INFLUENZA A/B
Influenza A, POC: NEGATIVE
Influenza B, POC: NEGATIVE

## 2023-08-19 MED ORDER — NIRMATRELVIR/RITONAVIR (PAXLOVID)TABLET
3.0000 | ORAL_TABLET | Freq: Two times a day (BID) | ORAL | 0 refills | Status: AC
Start: 2023-08-19 — End: 2023-08-24

## 2023-08-19 NOTE — Progress Notes (Signed)
Start time: 10:56 End time: 11:18  Virtual Visit via Video Note  I connected with Jacqueline Holmes on 08/19/23 by a video enabled telemedicine application and verified that I am speaking with the correct person using two identifiers.  Location: Patient: home Provider: office   I discussed the limitations of evaluation and management by telemedicine and the availability of in person appointments. The patient expressed understanding and agreed to proceed.  History of Present Illness:  Chief Complaint  Patient presents with   Cough    VIRTUAL cough and congestion, sneezing, runny nose and has had fever. Symptoms started Tues evening with ST. No home covid tests have been done.    On the night of 9/17 she started with sore throat. Yesterday she felt more congestion, sneezing. Coughing started last night. Had chills last night, no fever (T98). +body aches. Mild sinus pain/pressure.  Mucus is mostly clear sometimes yellowish (not too different from baseline).  Pt has asthma, continues on Trelegy. Has used Symbicort since Tuesday night (due to increased coughing, tight in chest). Currently denies much wheezing. Hasn't needed albuterol. Using Dayquil and Nyquil, helps some.  +sick contact--husband has been sick for a week, didn't test for COVID, starting to improve.   PMH, PSH, SH reviewed  Outpatient Encounter Medications as of 08/19/2023  Medication Sig Note   budesonide-formoterol (SYMBICORT) 160-4.5 MCG/ACT inhaler Inhale 2 puffs into the lungs 2 (two) times daily. 08/19/2023: Used today   Calcium Carb-Cholecalciferol (CALCIUM PLUS VITAMIN D3 PO) Take 1 capsule by mouth daily.    cholecalciferol (VITAMIN D3) 25 MCG (1000 UNIT) tablet Take 1,000 Units by mouth daily.    cyanocobalamin (VITAMIN B12) 1000 MCG tablet Take 1,000 mcg by mouth daily.    famotidine (PEPCID) 40 MG tablet Take 1 tablet (40 mg total) by mouth 2 (two) times daily.    Fexofenadine HCl (ALLEGRA PO) Take by mouth.     Fluticasone-Umeclidin-Vilant (TRELEGY ELLIPTA) 100-62.5-25 MCG/ACT AEPB Inhale 1 puff into the lungs daily.    levocetirizine (XYZAL) 5 MG tablet Take 1 tablet (5 mg total) by mouth every evening. TAKE ONE TABLET BY MOUTH EVERY EVENING    Pseudoephedrine-APAP-DM (DAYQUIL PO) Take 20 mLs by mouth as needed. 08/19/2023: Took this am   TEZSPIRE 210 MG/1. SOAJ Inject into the skin.    thyroid (NP THYROID) 90 MG tablet Take 1 tablet (90 mg total) by mouth in the morning and at bedtime. 06/03/2022: Takes 1 in the morning, 1/2 tablet in the afternoon   albuterol (VENTOLIN HFA) 108 (90 Base) MCG/ACT inhaler Inhale 2 puffs into the lungs every 4 (four) hours as needed for wheezing or shortness of breath. (Patient not taking: Reported on 08/19/2023) 08/19/2023: As needed   azelastine (ASTELIN) 0.1 % nasal spray Place 2 sprays into both nostrils 2 (two) times daily. Use in each nostril as directed (Patient not taking: Reported on 08/19/2023) 08/19/2023: As needed   cyclobenzaprine (FLEXERIL) 5 MG tablet Take 1 tablet twice a day by oral route for 30 days. (Patient not taking: Reported on 08/19/2023) 08/19/2023: As needed   EPINEPHrine (EPIPEN 2-PAK) 0.3 mg/0.3 mL IJ SOAJ injection Inject 0.3 mg into the muscle as needed for anaphylaxis (per injection protocol). (Patient not taking: Reported on 08/19/2023) 08/19/2023: As needed   [DISCONTINUED] metFORMIN (GLUCOPHAGE-XR) 500 MG 24 hr tablet Take 2 tablets (1,000 mg total) by mouth daily with breakfast.    Facility-Administered Encounter Medications as of 08/19/2023  Medication   tezepelumab-ekko (TEZSPIRE) 210 MG/1. syringe 210 mg  ROS:  URI symptoms per HPI. No n/v/d, no loss of taste/smell No rashes, bleeding. No chest pain or shortness of breath See HPI    Observations/Objective:  Temp 98 F (36.7 C) (Temporal)   Ht 5\' 1"  (1.549 m)   Wt 176 lb (79.8 kg)   LMP 09/30/2016 Comment: aub 2021  BMI 33.25 kg/m   Pleasant female, in no  distress. Occasional cough/throat clearing during visit. Speaking comfortably. She is alert and oriented, cranial nerves grossly intact. Exam is limited due to the virtual nature of the visit.  COVID-19 + Influenza A&B negative   Assessment and Plan:  COVID-19 virus infection - Treat with Paxlovid, SE reviewed. Supportive measures reviewed, as well as recs for isolation, mask-wearing, and when to follow-up - Plan: nirmatrelvir/ritonavir (PAXLOVID) 20 x 150 MG & 10 x 100MG  TABS  [facility] for Paxlovid GFR >60   You can either continue the dayquil and nyquil, with an extra dose of plain guaifenesin (mucinex) at bedtime, or switch to longer-acting medications such as 12 hour Mucinex DM, taken twice daily.  Do NOT use the dayquil or nyquil along with this. You can take a decongestant along with the Mucinex DM. Do sinus rinses if needed for sinus pain. Continue your Trelegy, and the Symbicort as needed, throughout this illness, with albuterol in addition, if needed. We alternatively discussed AirSupra (in place of Symbicort for the future--you can talk to your asthma doctor about this new medication). Be sure to rinse your mouth after using the Symbicort. Continue Allegra and xyzal daily. If your tests are negative, and you have persistent cough, fever, shortness of breath, I recommend an in-person evaluation. If you have COVID, and your albuterol doesn't seem to be helping with your shortness of breath or wheezing, contact us for a course of prednisone.    Follow Up Instructions:    I discussed the assessment and treatment plan with the patient. The patient was provided an opportunity to ask questions and all were answered. The patient agreed with the plan and demonstrated an understanding of the instructions.   The patient was advised to call back or seek an in-person evaluation if the symptoms worsen or if the condition fails to improve as anticipated.  I spent 26 minutes dedicated  to the care of this patient, including pre-visit review of records, face to face time, post-visit ordering of testing and documentation.    Lavonda Jumbo, MD

## 2023-08-19 NOTE — Patient Instructions (Signed)
You can either continue the dayquil and nyquil, with an extra dose of plain guaifenesin (mucinex) at bedtime, or switch to longer-acting medications such as 12 hour Mucinex DM, taken twice daily.  Do NOT use the dayquil or nyquil along with this. You can take a decongestant along with the Mucinex DM. Do sinus rinses if needed for sinus pain. Continue your Trelegy, and the Symbicort as needed, throughout this illness, with albuterol in addition, if needed. We alternatively discussed AirSupra (in place of Symbicort for the future--you can talk to your asthma doctor about this new medication). Be sure to rinse your mouth after using the Symbicort. Continue Allegra and xyzal daily. If your tests are negative, and you have persistent cough, fever, shortness of breath, I recommend an in-person evaluation. If you have COVID, and your albuterol doesn't seem to be helping with your shortness of breath or wheezing, contact us for a course of prednisone.

## 2023-08-27 ENCOUNTER — Ambulatory Visit (INDEPENDENT_AMBULATORY_CARE_PROVIDER_SITE_OTHER): Payer: Self-pay

## 2023-08-27 DIAGNOSIS — J309 Allergic rhinitis, unspecified: Secondary | ICD-10-CM | POA: Diagnosis not present

## 2023-09-07 ENCOUNTER — Ambulatory Visit (INDEPENDENT_AMBULATORY_CARE_PROVIDER_SITE_OTHER): Payer: BC Managed Care – PPO | Admitting: *Deleted

## 2023-09-07 DIAGNOSIS — J309 Allergic rhinitis, unspecified: Secondary | ICD-10-CM | POA: Diagnosis not present

## 2023-09-13 DIAGNOSIS — J301 Allergic rhinitis due to pollen: Secondary | ICD-10-CM | POA: Diagnosis not present

## 2023-09-13 NOTE — Progress Notes (Signed)
VIALS EXP 09-12-24

## 2023-09-21 ENCOUNTER — Other Ambulatory Visit (HOSPITAL_COMMUNITY)
Admission: RE | Admit: 2023-09-21 | Discharge: 2023-09-21 | Disposition: A | Discharge status: Home / Self Care | Payer: BC Managed Care – PPO | Source: Ambulatory Visit | Attending: Oncology | Admitting: Oncology

## 2023-09-21 DIAGNOSIS — Z006 Encounter for examination for normal comparison and control in clinical research program: Secondary | ICD-10-CM | POA: Insufficient documentation

## 2023-09-22 ENCOUNTER — Other Ambulatory Visit: Payer: Self-pay | Admitting: Medical Genetics

## 2023-09-22 DIAGNOSIS — Z006 Encounter for examination for normal comparison and control in clinical research program: Secondary | ICD-10-CM

## 2023-09-22 NOTE — Progress Notes (Signed)
New order needed. Confirmed consent is on file.

## 2023-09-23 ENCOUNTER — Ambulatory Visit (INDEPENDENT_AMBULATORY_CARE_PROVIDER_SITE_OTHER): Payer: BC Managed Care – PPO | Admitting: *Deleted

## 2023-09-23 DIAGNOSIS — J309 Allergic rhinitis, unspecified: Secondary | ICD-10-CM | POA: Diagnosis not present

## 2023-10-04 ENCOUNTER — Other Ambulatory Visit: Payer: Self-pay | Admitting: Allergy & Immunology

## 2023-10-06 ENCOUNTER — Encounter: Payer: Self-pay | Admitting: Obstetrics and Gynecology

## 2023-10-08 ENCOUNTER — Ambulatory Visit (INDEPENDENT_AMBULATORY_CARE_PROVIDER_SITE_OTHER): Payer: BC Managed Care – PPO

## 2023-10-08 DIAGNOSIS — J309 Allergic rhinitis, unspecified: Secondary | ICD-10-CM | POA: Diagnosis not present

## 2023-10-20 ENCOUNTER — Ambulatory Visit (INDEPENDENT_AMBULATORY_CARE_PROVIDER_SITE_OTHER): Payer: BC Managed Care – PPO | Admitting: *Deleted

## 2023-10-20 DIAGNOSIS — J309 Allergic rhinitis, unspecified: Secondary | ICD-10-CM

## 2023-11-04 ENCOUNTER — Ambulatory Visit (INDEPENDENT_AMBULATORY_CARE_PROVIDER_SITE_OTHER): Payer: BC Managed Care – PPO | Admitting: *Deleted

## 2023-11-04 DIAGNOSIS — J309 Allergic rhinitis, unspecified: Secondary | ICD-10-CM | POA: Diagnosis not present

## 2023-11-12 ENCOUNTER — Ambulatory Visit (INDEPENDENT_AMBULATORY_CARE_PROVIDER_SITE_OTHER): Payer: Self-pay | Admitting: *Deleted

## 2023-11-12 DIAGNOSIS — J309 Allergic rhinitis, unspecified: Secondary | ICD-10-CM

## 2023-11-17 LAB — GENECONNECT MOLECULAR SCREEN: Genetic Analysis Overall Interpretation: NEGATIVE

## 2023-11-19 ENCOUNTER — Ambulatory Visit (INDEPENDENT_AMBULATORY_CARE_PROVIDER_SITE_OTHER): Payer: BC Managed Care – PPO

## 2023-11-19 DIAGNOSIS — J309 Allergic rhinitis, unspecified: Secondary | ICD-10-CM

## 2023-12-08 ENCOUNTER — Other Ambulatory Visit: Payer: Self-pay | Admitting: Family Medicine

## 2023-12-14 NOTE — Patient Instructions (Addendum)
  HEALTH MAINTENANCE RECOMMENDATIONS:  It is recommended that you get at least 30 minutes of aerobic exercise at least 5 days/week (for weight loss, you may need as much as 60-90 minutes). This can be any activity that gets your heart rate up. This can be divided in 10-15 minute intervals if needed, but try and build up your endurance at least once a week.  Weight bearing exercise is also recommended twice weekly.  Eat a healthy diet with lots of vegetables, fruits and fiber.  "Colorful" foods have a lot of vitamins (ie green vegetables, tomatoes, red peppers, etc).  Limit sweet tea, regular sodas and alcoholic beverages, all of which has a lot of calories and sugar.  Up to 1 alcoholic drink daily may be beneficial for women (unless trying to lose weight, watch sugars).  Drink a lot of water .  Calcium  recommendations are 1200-1500 mg daily (1500 mg for postmenopausal women or women without ovaries), and vitamin D  1000 IU daily.  This should be obtained from diet and/or supplements (vitamins), and calcium  should not be taken all at once, but in divided doses.  Monthly self breast exams and yearly mammograms for women over the age of 78 is recommended.  Sunscreen of at least SPF 30 should be used on all sun-exposed parts of the skin when outside between the hours of 10 am and 4 pm (not just when at beach or pool, but even with exercise, golf, tennis, and yard work!)  Use a sunscreen that says "broad spectrum" so it covers both UVA and UVB rays, and make sure to reapply every 1-2 hours.  Remember to change the batteries in your smoke detectors when changing your clock times in the spring and fall. Carbon monoxide detectors are recommended for your home.  Use your seat belt every time you are in a car, and please drive safely and not be distracted with cell phones and texting while driving.  Please schedule the shingles vaccine (second dose) soon. Consider getting another pneumonia vaccine at some point  in the next year or so.  We briefly discussed both pneumovax and prevnar-20 (I'm leaning towards the former, but likely could be either).

## 2023-12-14 NOTE — Progress Notes (Signed)
 Chief Complaint  Patient presents with   Annual Exam    Annual exam, patient is not fasting-she wants to come back in the am. Still taking b12 and vit D but prefers to have both levels checked. Had flu, covid and shingrix  #1. Prefers to wait on #2 and Prevnar due to allergy shots today. Has been fatigue for the last few months.    Jacqueline Holmes is a 53 y.o. female who presents for a complete physical.  She sees Dr. Colvin Dec for her GYN care. She is scheduled for a visit next month. She previously took HRT, but due to breast tenderness, was weaned off.  Has some hot flashes, and has had some urinary urgency for which she had pelvic floor PT. She continues to have hot flashes, and plans to discuss restarting HRT with Dr. Colvin Dec.  She has intermittent LLQ pain, ongoing for years.  Notices it when she goes to lift her left leg, when leaning forward, such as putting on her pants. Denies nausea, vomiting, change in bowels.  Pain related to certain positions, not related to eating. It was felt to like be MSK etiology, but also had discussed potential GI etiology (constipation, diverticular dz). High fiber diet was encouraged. She still gets the discomfort sporadically, but less often than last year.  EIC on posterior neck--removed by [facility] last year. Denies any  problems or recurrence.  B12 deficiency: Diagnosed by her GI (level 211 on 03/02/22), when having complaints of sensitivity and burning sensation of her mouth/tongue.  She had severe itching after the first injection (03/05/22). She noted improvement in energy and focus after this first shot.  She was later switched to oral supplement (1000 mcg daily). We had discussed that since she was no longer on PPI, she may be able to stop the B12 after tongue symptoms resolve, if GI symptoms are adequately managed with famotidine .  If she requires PPI long-term, then oral B12 should be continued. She still reports having some tongue issues (related to spicy foods),  which gets worse when she has run out of B12.  Never got completely better.  Due to this, she didn't want to do a trial off B12 supplement.  Her husband cooks a lot of spicy foods.  Lab Results  Component Value Date   VITAMINB12 1,870 (H) 08/12/2022    GERD/gatritis:  She had been on omeprazole  40mg  BID for over 1 year, which has worked well in controlling reflux. EGD 01/2021 showed erosive gastropathy with no bleeding and no stigmata of recent bleeding. Biopsy showed mild chronic gastritis, mild reactive gastropathy.  Negative for H.pylori.  She was then switched to famotidine  40mg  BID before meals after diagnosis of B12 deficiency (in 2023). She reports doing well now, infrequent heartburn.  Last episode was 2 weeks ago, relieved by Tums. She is now getting the famotidine  from her allergist.   Vitamin D  level was also checked in 02/2022, level was at the lower end of normal range, at 34.39.  She completed 50,000 IU weekly x 8 weeks, and was taking 1000 IU daily (hadn't been taking any vitamin D3 prior to that check).  She is currently taking a D3 daily, unsure of the dose (last time she wrote it down, it was 50 mcg--2000 IU, in 08/2023).  Pulmonary nodule:  noted on CXR 09/2021. F/u CT in 12/2021 was stable (9mm in RUL), rec add'l f/u in 6-12 months.  12/2022 f/u showed: Stable solid 9mm pulmonary nodule, unchanged since 2022. No consensus  guidelines are available from Fleischner [employer] for stable nodules. However, 12 month follow up is suggested to document stability. She is due for recheck of CT She denies any changes in breathing (no changes to cough, shortness of breath, chest pain).   Post-operative hypothyroidism, h/o Graves disease, s/p thyroidectomy.  She is now under the care of Dr. Geanie Keen at [facility] (previously treated by [facility]). Currently she is on Armour thyroid  90 mg in the morning, 45 mg in the afternoon.  She saw Dr. Geanie Keen earlier this month, and TSH, Free T4 and total T3  were normal. No dose changes were made.  She denies changes to hair/skin/nails/moods/weight/energy.  Asthma and allergies: On immunotherapy, q4 weeks. She takes Allegra in the morning, Xyzal  in the evening. She uses Trelegy every morning, and Tezspire  once a month. She only rarely needs albuterol  (2-3x/year, if sick). Also has Symbicort  for prn use.  Problem with L ear pain in April/May. Stopped up, couldn't hear. Saw ENT, told it was sinus-related.  She still occasionally gets discomfort. Denies symptoms currently, but had some a couple of days ago. Had cerumen removed a few months ago--didn't think that was the cause of her symptoms.  Immunization History  Administered Date(s) Administered   Influenza Inj Mdck Quad Pf 10/29/2017   Influenza,inj,Quad PF,6+ Mos 08/30/2018, 09/14/2019, 09/18/2020   Influenza-Unspecified 09/13/2016, 08/30/2021, 10/01/2023   PFIZER(Purple Top)SARS-COV-2 Vaccination 02/02/2020, 03/01/2020, 09/18/2020   Pfizer Covid-19 Vaccine Bivalent Booster 68yrs & up 10/24/2021   Pfizer(Comirnaty)Fall Seasonal Vaccine 12 years and older 10/01/2023   Pneumococcal Polysaccharide-23 05/16/2018   Tdap 05/16/2018   Zoster Recombinant(Shingrix ) 10/01/2023   Last Pap smear: 01/2020 with Dr. Colvin Dec, normal, no high risk HPV Last mammogram: 10/2023 (prev--08/2021 screening, f/b L mammo/US  in 11/2021 at [facility] due to nipple pain; then had bilateral breast MRI, and biopsy of R UIQ 01/2022, benign--focal fat necrosis, rare mammary duct with adjacent stromal fibrosis. No calcifications or carcinoma). Last colonoscopy: 11/2020 Dr. Savannah Curlin, showing internal hemorrhoids, sigmoid diverticulosis, 2 polyps in the ascending colon (tubular adenomas), and multiple ulcers (proximal transverse colon, hepatic flexure and in the ascending colon)--biopsy showed mucosa with patchy degenerative glands with hyalinization of lamina propria, suggestive of ischemic colitis. Felt to be due to NSAIDS, which were  stopped, and pt put on PPI. Repeat colonoscopy in 7 years. Dr. Savannah Curlin stated low threshold to pursue CTA and repeat colonoscopy with recurrent abdominal pain Last DEXA: 06/2022 T-1.1 L fem neck, -1.3 L forearm/radius Dentist: 4x/year (periodontal issues) Ophtho: yearly, but past due Exercise: Walks some on campus in nice weather. Exercise is otherwise limited.  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  Lipids: Lab Results  Component Value Date   CHOL 212 (H) 08/12/2022   HDL 54 08/12/2022   LDLCALC 125 (H) 08/12/2022   TRIG 186 (H) 08/12/2022   CHOLHDL 3.9 08/12/2022    PMH, PSH, SH and FH were reviewed and updated   Outpatient Encounter Medications as of 12/15/2023  Medication Sig Note   Calcium  Carb-Cholecalciferol (CALCIUM  PLUS VITAMIN D3 PO) Take 1 capsule by mouth daily.    cholecalciferol (VITAMIN D3) 25 MCG (1000 UNIT) tablet Take 1,000 Units by mouth daily. 12/15/2023: Thinks she is taking 2000 IU (50 mcg) daily since September   cyanocobalamin  (VITAMIN B12) 1000 MCG tablet Take 1,000 mcg by mouth daily.    cyclobenzaprine (FLEXERIL) 5 MG tablet  12/15/2023: As needed   famotidine  (PEPCID ) 40 MG tablet Take 1 tablet (40 mg total) by mouth 2 (two) times  daily.    Fexofenadine HCl (ALLEGRA PO) Take by mouth.    Fluticasone -Umeclidin-Vilant (TRELEGY ELLIPTA ) 100-62.5-25 MCG/ACT AEPB Inhale 1 puff into the lungs daily.    levocetirizine (XYZAL ) 5 MG tablet TAKE ONE TABLET BY MOUTH EVERY EVENING    TEZSPIRE  210 MG/1. SOAJ Inject into the skin.    thyroid  (NP THYROID ) 90 MG tablet Take 1 tablet (90 mg total) by mouth in the morning and at bedtime. 06/03/2022: Takes 1 in the morning, 1/2 tablet in the afternoon   albuterol  (VENTOLIN  HFA) 108 (90 Base) MCG/ACT inhaler Inhale 2 puffs into the lungs every 4 (four) hours as needed for wheezing or shortness of breath. (Patient not taking: Reported on 12/15/2023) 12/15/2023: As needed   azelastine  (ASTELIN ) 0.1 % nasal spray Place  2 sprays into both nostrils 2 (two) times daily. Use in each nostril as directed (Patient not taking: Reported on 12/15/2023) 12/15/2023: As needed   budesonide -formoterol  (SYMBICORT ) 160-4.5 MCG/ACT inhaler Inhale 2 puffs into the lungs 2 (two) times daily. (Patient not taking: Reported on 12/15/2023) 12/15/2023: As needed   EPINEPHrine  (EPIPEN  2-PAK) 0.3 mg/0.3 mL IJ SOAJ injection Inject 0.3 mg into the muscle as needed for anaphylaxis (per injection protocol). (Patient not taking: Reported on 12/15/2023) 12/15/2023: As needed   [DISCONTINUED] metFORMIN  (GLUCOPHAGE -XR) 500 MG 24 hr tablet Take 2 tablets (1,000 mg total) by mouth daily with breakfast.    [DISCONTINUED] Pseudoephedrine-APAP-DM (DAYQUIL PO) Take 20 mLs by mouth as needed. 08/19/2023: Took this am   Facility-Administered Encounter Medications as of 12/15/2023  Medication   tezepelumab -ekko (TEZSPIRE ) 210 MG/1. syringe 210 mg   Allergies  Allergen Reactions   B-12 [Cyanocobalamin ] Itching    Generalized itching following 03/05/22 IM injection. No benefit from Benadryl. Started steroid taper    ROS:  The patient denies anorexia, fever, weight changes, headaches,  vision changes, decreased hearing, ear pain, sore throat, breast concerns, chest pain, palpitations, dizziness, syncope, dyspnea on exertion, cough, swelling, nausea, vomiting, diarrhea, constipation, melena, hematochezia, indigestion/heartburn, hematuria, incontinence, dysuria, vaginal bleeding, discharge, odor or itch, genital lesions,numbness, tingling, weakness, tremor, suspicious skin lesions, depression, anxiety, abnormal bleeding/bruising, or enlarged lymph nodes.  L ear symptoms per HPI L lateral hip pain after laying on her back. Doesn't hurt to lay on the left side. Rare discomfort at L nipple Urinary frequency --much better after PT Hot flashes since stopping HRT, no night sweats. Some bowel urgency, mainly after drinking coffee.  Denies abdominal pain, other than  intermittent L-sided pain, positional. Every once in a while will get stomach cramps (similar to prior IBS), when stressed. Frequent HA's (sinus, tension), often at left posterior/lateral neck. Saw ortho in the past--rx'd muscle relaxers, suggested PT. Improved with muscle relaxers. Last month she had some tingling in her fingertips for a day. Denies weakness. Occasionally gets all-over body aches for up to a week, not associated with any illness.  Feels fine now.   PHYSICAL EXAM:  BP 118/72   Pulse 88   Ht 5' 1.5" (1.562 m)   Wt 180 lb 12.8 oz (82 kg)   LMP 09/30/2016 Comment: aub 2021  BMI 33.61 kg/m   Wt Readings from Last 3 Encounters:  12/15/23 180 lb 12.8 oz (82 kg)  08/19/23 176 lb (79.8 kg)  04/21/23 180 lb (81.6 kg)    General Appearance:    Alert, cooperative, no distress, appears stated age. She did not change into gown for today's exam  Head:    Normocephalic, without obvious abnormality, atraumatic  Eyes:  PERRL, conjunctiva/corneas clear, EOM's intact, fundi    benign  Ears:    Normal TM's and external ear canals  Nose:   No drainage or sinus tenderness  Throat:   Lips, mucosa, and tongue normal; teeth and gums normal  Neck:   Supple, no lymphadenopathy;  thyroid :  no enlargement/ tenderness/nodules; no carotid bruit or JVD. Minimal residual cyst noted at L posterior neck.  Back:    Spine nontender, no curvature, ROM normal, no CVA     tenderness  Lungs:     Clear to auscultation bilaterally without wheezes, rales or     ronchi; respirations unlabored  Chest Wall:    No tenderness or deformity.   Heart:    Regular rate and rhythm, S1 and S2 normal, no murmur, rub or gallop  Breast Exam:    Deferred to GYN.   Abdomen:     Soft, nontender, nondistended, normoactive bowel sounds, no masses, no hepatosplenomegaly.   Genitalia:    Deferred to GYN     Extremities:   No clubbing, cyanosis or edema  Pulses:   2+ and symmetric all extremities  Skin:   Skin color,  texture, turgor normal, no rashes or lesions. Many tattoos (bilateral ankles, L forearm, back). Skin exam limited as she wasn't in gown  Lymph nodes:   Cervical, supraclavicular, and axillary nodes normal  Neurologic:   CNII-XII intact, normal strength, sensation and gait; reflexes 2+ and symmetric throughout          Psych:   Normal mood, affect, hygiene and grooming.     ASSESSMENT/PLAN:  Annual physical exam -     Lipid panel; Future -     Comprehensive metabolic panel; Future -     CBC with Differential/Platelet; Future -     VITAMIN D  25 Hydroxy (Vit-D Deficiency, Fractures); Future  Vitamin D  deficiency Assessment & Plan: Believes she is taking 2000 IU daily. Continue daily supplement  Orders: -     VITAMIN D  25 Hydroxy (Vit-D Deficiency, Fractures); Future  Vitamin B12 deficiency Assessment & Plan: She continues on oral supplement.  No longer on PPI.  Declined trial of stopping B12, since she has ongoing tongue sensitivity, which is worse if she forgets her B12 vitamin  Orders: -     CBC with Differential/Platelet; Future -     Vitamin B12; Future  Gastroesophageal reflux disease, unspecified whether esophagitis present Assessment & Plan: Controlled with famotidine  BID   Postoperative hypothyroidism Assessment & Plan: Under the care of Dr. Geanie Keen, with recent normal thyroid  function tests on Armour thyroid  90mg  qam, 45 mg in the afternoon   Seasonal and perennial allergic rhinitis Assessment & Plan: Doing well on immunotherapy q4 weeks, Allegra and Xyzal    Mild persistent asthma, uncomplicated Assessment & Plan: Doing well on Trelegy, Tezspire , Symbicort  and albuterol  prn   Pulmonary nodule, right Assessment & Plan: RUL--due for 1 year f/u scan  Orders: -     CT CHEST WO CONTRAST; Future  Mixed hyperlipidemia Assessment & Plan: Reviewed low fat, low cholesterol diet. Due for recheck  Orders: -     Lipid panel; Future  Medication monitoring  encounter -     Comprehensive metabolic panel; Future -     CBC with Differential/Platelet; Future -     VITAMIN D  25 Hydroxy (Vit-D Deficiency, Fractures); Future    Discussed monthly self breast exams and yearly mammograms; at least 30 minutes of aerobic activity at least 5 days/week, weight-bearing exercise at least 2x/week; proper  sunscreen use reviewed; healthy diet, including goals of calcium  and vitamin D  intake and alcohol recommendations (less than or equal to 1 drink/day) reviewed; regular seatbelt use; changing batteries in smoke detectors.  Immunization recommendations discussed--continue yearly flu shots.   Discussed COVID booster (illness was in September 2024) Shingrix  #2 is due to get soon. Consider another pneumonia vaccine soon. (Declined vaccines as she is due for allergy shot today or tomorrow). Colonoscopy recommendations reviewed, UTD.  F/u 1 year, sooner prn.

## 2023-12-15 ENCOUNTER — Ambulatory Visit: Payer: BC Managed Care – PPO | Admitting: Family Medicine

## 2023-12-15 ENCOUNTER — Ambulatory Visit (INDEPENDENT_AMBULATORY_CARE_PROVIDER_SITE_OTHER): Payer: Self-pay | Admitting: *Deleted

## 2023-12-15 VITALS — BP 118/72 | HR 88 | Ht 61.5 in | Wt 180.8 lb

## 2023-12-15 DIAGNOSIS — Z0000 Encounter for general adult medical examination without abnormal findings: Secondary | ICD-10-CM

## 2023-12-15 DIAGNOSIS — Z5181 Encounter for therapeutic drug level monitoring: Secondary | ICD-10-CM

## 2023-12-15 DIAGNOSIS — J309 Allergic rhinitis, unspecified: Secondary | ICD-10-CM | POA: Diagnosis not present

## 2023-12-15 DIAGNOSIS — J4530 Mild persistent asthma, uncomplicated: Secondary | ICD-10-CM

## 2023-12-15 DIAGNOSIS — K219 Gastro-esophageal reflux disease without esophagitis: Secondary | ICD-10-CM

## 2023-12-15 DIAGNOSIS — E890 Postprocedural hypothyroidism: Secondary | ICD-10-CM

## 2023-12-15 DIAGNOSIS — J3089 Other allergic rhinitis: Secondary | ICD-10-CM

## 2023-12-15 DIAGNOSIS — E538 Deficiency of other specified B group vitamins: Secondary | ICD-10-CM

## 2023-12-15 DIAGNOSIS — E559 Vitamin D deficiency, unspecified: Secondary | ICD-10-CM | POA: Diagnosis not present

## 2023-12-15 DIAGNOSIS — J302 Other seasonal allergic rhinitis: Secondary | ICD-10-CM

## 2023-12-15 DIAGNOSIS — E782 Mixed hyperlipidemia: Secondary | ICD-10-CM

## 2023-12-15 DIAGNOSIS — R911 Solitary pulmonary nodule: Secondary | ICD-10-CM

## 2023-12-16 ENCOUNTER — Other Ambulatory Visit: Payer: 59

## 2023-12-16 DIAGNOSIS — E782 Mixed hyperlipidemia: Secondary | ICD-10-CM

## 2023-12-16 DIAGNOSIS — E538 Deficiency of other specified B group vitamins: Secondary | ICD-10-CM

## 2023-12-16 DIAGNOSIS — Z5181 Encounter for therapeutic drug level monitoring: Secondary | ICD-10-CM

## 2023-12-16 DIAGNOSIS — Z0000 Encounter for general adult medical examination without abnormal findings: Secondary | ICD-10-CM

## 2023-12-16 DIAGNOSIS — E559 Vitamin D deficiency, unspecified: Secondary | ICD-10-CM

## 2023-12-16 NOTE — Assessment & Plan Note (Signed)
Doing well on immunotherapy q4 weeks, Allegra and Xyzal

## 2023-12-16 NOTE — Assessment & Plan Note (Signed)
RUL--due for 1 year f/u scan

## 2023-12-16 NOTE — Assessment & Plan Note (Signed)
Reviewed low fat, low cholesterol diet. Due for recheck

## 2023-12-16 NOTE — Assessment & Plan Note (Signed)
She continues on oral supplement.  No longer on PPI.  Declined trial of stopping B12, since she has ongoing tongue sensitivity, which is worse if she forgets her B12 vitamin

## 2023-12-16 NOTE — Assessment & Plan Note (Signed)
Controlled with famotidine BID

## 2023-12-16 NOTE — Assessment & Plan Note (Signed)
Believes she is taking 2000 IU daily. Continue daily supplement

## 2023-12-16 NOTE — Assessment & Plan Note (Signed)
Under the care of Dr. Kathreen Cosier, with recent normal thyroid function tests on Armour thyroid 90mg  qam, 45 mg in the afternoon

## 2023-12-16 NOTE — Assessment & Plan Note (Signed)
Doing well on Trelegy, Tezspire, Symbicort and albuterol prn

## 2023-12-17 LAB — COMPREHENSIVE METABOLIC PANEL
ALT: 32 [IU]/L (ref 0–32)
AST: 20 [IU]/L (ref 0–40)
Albumin: 4.1 g/dL (ref 3.8–4.9)
Alkaline Phosphatase: 106 [IU]/L (ref 44–121)
BUN/Creatinine Ratio: 24 — ABNORMAL HIGH (ref 9–23)
BUN: 22 mg/dL (ref 6–24)
Bilirubin Total: 0.3 mg/dL (ref 0.0–1.2)
CO2: 23 mmol/L (ref 20–29)
Calcium: 9.4 mg/dL (ref 8.7–10.2)
Chloride: 103 mmol/L (ref 96–106)
Creatinine, Ser: 0.91 mg/dL (ref 0.57–1.00)
Globulin, Total: 3 g/dL (ref 1.5–4.5)
Glucose: 133 mg/dL — ABNORMAL HIGH (ref 70–99)
Potassium: 4.9 mmol/L (ref 3.5–5.2)
Sodium: 142 mmol/L (ref 134–144)
Total Protein: 7.1 g/dL (ref 6.0–8.5)
eGFR: 75 mL/min/{1.73_m2} (ref >59)

## 2023-12-17 LAB — LIPID PANEL
Chol/HDL Ratio: 3.9 {ratio} (ref 0.0–4.4)
Cholesterol, Total: 216 mg/dL — ABNORMAL HIGH (ref 100–199)
HDL: 55 mg/dL (ref >39)
LDL Chol Calc (NIH): 138 mg/dL — ABNORMAL HIGH (ref 0–99)
Triglycerides: 128 mg/dL (ref 0–149)
VLDL Cholesterol Cal: 23 mg/dL (ref 5–40)

## 2023-12-17 LAB — CBC WITH DIFFERENTIAL/PLATELET
Basophils Absolute: 0 10*3/uL (ref 0.0–0.2)
Basos: 1 %
EOS (ABSOLUTE): 0.1 10*3/uL (ref 0.0–0.4)
Eos: 1 %
Hematocrit: 46.7 % — ABNORMAL HIGH (ref 34.0–46.6)
Hemoglobin: 15.1 g/dL (ref 11.1–15.9)
Immature Grans (Abs): 0 10*3/uL (ref 0.0–0.1)
Immature Granulocytes: 0 %
Lymphocytes Absolute: 2.9 10*3/uL (ref 0.7–3.1)
Lymphs: 35 %
MCH: 28.1 pg (ref 26.6–33.0)
MCHC: 32.3 g/dL (ref 31.5–35.7)
MCV: 87 fL (ref 79–97)
Monocytes Absolute: 0.5 10*3/uL (ref 0.1–0.9)
Monocytes: 7 %
Neutrophils Absolute: 4.8 10*3/uL (ref 1.4–7.0)
Neutrophils: 56 %
Platelets: 327 10*3/uL (ref 150–450)
RBC: 5.37 x10E6/uL — ABNORMAL HIGH (ref 3.77–5.28)
RDW: 12.7 % (ref 11.7–15.4)
WBC: 8.4 10*3/uL (ref 3.4–10.8)

## 2023-12-17 LAB — SPECIMEN STATUS REPORT

## 2023-12-17 LAB — VITAMIN D 25 HYDROXY (VIT D DEFICIENCY, FRACTURES): Vit D, 25-Hydroxy: 86.7 ng/mL (ref 30.0–100.0)

## 2023-12-17 LAB — HGB A1C W/O EAG: Hgb A1c MFr Bld: 7.3 % — ABNORMAL HIGH (ref 4.8–5.6)

## 2023-12-17 LAB — VITAMIN B12: Vitamin B-12: >2000 pg/mL — ABNORMAL HIGH (ref 232–1245)

## 2023-12-18 ENCOUNTER — Encounter: Payer: Self-pay | Admitting: Family Medicine

## 2023-12-21 NOTE — Progress Notes (Unsigned)
Start time: End time:  Virtual Visit via Video Note  I connected with Jacqueline Holmes on 12/21/23 by a video enabled telemedicine application and verified that I am speaking with the correct person using two identifiers.  Location: Patient: *** Provider: office   I discussed the limitations of evaluation and management by telemedicine and the availability of in person appointments. The patient expressed understanding and agreed to proceed.  History of Present Illness:  No chief complaint on file.  Visit today is to discuss recent lab results, new diagnosis of diabetes. Her fasting glucose was 133, and A1c was 7.3. Prior fasting glucse was 111 in 07/2021, with elevated TG as well, and 6 month f/u was recommended (under lipid results), but this was never scheduled.  Prior history of pre-diabetes, took  metformin in the past, none since 2021. Per chart review, prescribed by someone else from 2017, only rx'd once by me in 07/2020, d/c'd by someone else. Last A1c prior today recent one was 6.1 in 07/2020.  Frequent steroids?*** Diet Exercise: occasional walking in nice weather    Observations/Objective:  LMP 09/30/2016 Comment: aub 2021 Wt Readings from Last 3 Encounters:  12/15/23 180 lb 12.8 oz (82 kg)  08/19/23 176 lb (79.8 kg)  04/21/23 180 lb (81.6 kg)      Lab Results  Component Value Date   HGBA1C 7.3 (H) 12/16/2023   Lab Results  Component Value Date   CHOL 216 (H) 12/16/2023   HDL 55 12/16/2023   LDLCALC 138 (H) 12/16/2023   TRIG 128 12/16/2023   CHOLHDL 3.9 12/16/2023     Assessment and Plan:   Dm--med vs dietary trial? Restart metformin vs other (?injection/GLP?)  Statin  Ref to DM education?  F/u 3 months--foot exam, urine microalbumin at visit. Consider another pneumonia vaccine, as discussed at CPE.   Follow Up Instructions:    I discussed the assessment and treatment plan with the patient. The patient was provided an opportunity to ask  questions and all were answered. The patient agreed with the plan and demonstrated an understanding of the instructions.   The patient was advised to call back or seek an in-person evaluation if the symptoms worsen or if the condition fails to improve as anticipated.  I spent *** minutes dedicated to the care of this patient, including pre-visit review of records, face to face time, post-visit ordering of testing and documentation.    Lavonda Jumbo, MD

## 2023-12-22 ENCOUNTER — Encounter: Payer: Self-pay | Admitting: Family Medicine

## 2023-12-22 ENCOUNTER — Telehealth (INDEPENDENT_AMBULATORY_CARE_PROVIDER_SITE_OTHER): Payer: 59 | Admitting: Family Medicine

## 2023-12-22 VITALS — Ht 61.5 in | Wt 177.0 lb

## 2023-12-22 DIAGNOSIS — Z7984 Long term (current) use of oral hypoglycemic drugs: Secondary | ICD-10-CM

## 2023-12-22 DIAGNOSIS — E782 Mixed hyperlipidemia: Secondary | ICD-10-CM

## 2023-12-22 DIAGNOSIS — E119 Type 2 diabetes mellitus without complications: Secondary | ICD-10-CM | POA: Diagnosis not present

## 2023-12-22 MED ORDER — ROSUVASTATIN CALCIUM 10 MG PO TABS: 10.0000 mg | ORAL_TABLET | Freq: Every day | ORAL | 3 refills | Status: DC

## 2023-12-22 MED ORDER — METFORMIN HCL ER 500 MG PO TB24: 1000.0000 mg | ORAL_TABLET | Freq: Every day | ORAL | 1 refills | Status: DC

## 2023-12-22 NOTE — Progress Notes (Signed)
 53 y.o. G2P1 Married Caucasian female here for annual exam.    New dx of type II DM.  A1C 7.3 on 12/16/23.  Having some boils on the skin of her breasts.  Uses warm compresses.  Previously had them on her vulva.   Stopped HRT due to breast pain.  Having hot flashes day and night.  Night is worse for her, wakes her up.   Some life stress.  Took Lexapro in the past.  Denies depression.   Denies suicidal ideation.   Graduated.  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   PCP: Randol Dawes, MD   Patient's last menstrual period was 09/30/2016.           Sexually active: Yes.    The current method of family planning is post menopausal status.    Menopausal hormone therapy:  n/a Exercising: No.   Smoker:  no  OB History  Gravida Para Term Preterm AB Living  2 1    1   SAB IAB Ectopic Multiple Live Births          # Outcome Date GA Lbr Len/2nd Weight Sex Type Anes PTL Lv  2 Gravida           1 Para              HEALTH MAINTENANCE: Last 2 paps:  01-22-20 Neg:Neg HR HPV, 09-02-18 Neg:Neg HR HPV  History of abnormal Pap or positive HPV:  yes, 2005 Mammogram:   10/05/23 Breast density Cat A, BI-RADS CAT 2 benign Colonoscopy: 12/24/20 - due in 2029 due to polyps Bone Density:  n/a  Result  n/a   Immunization History  Administered Date(s) Administered   Influenza Inj Mdck Quad Pf 10/29/2017   Influenza,inj,Quad PF,6+ Mos 08/30/2018, 09/14/2019, 09/18/2020   Influenza-Unspecified 09/13/2016, 08/30/2021, 10/01/2023   PFIZER(Purple Top)SARS-COV-2 Vaccination 02/02/2020, 03/01/2020, 09/18/2020   Pfizer Covid-19 Vaccine Bivalent Booster 16yrs & up 10/24/2021   Pfizer(Comirnaty)Fall Seasonal Vaccine 12 years and older 10/01/2023   Pneumococcal Polysaccharide-23 05/16/2018   Tdap 05/16/2018   Zoster Recombinant(Shingrix ) 10/01/2023      reports that she has never smoked. She has never been exposed to tobacco smoke. She has never used smokeless tobacco. She reports current alcohol  use. She reports that she does not use drugs.  Past Medical History:  Diagnosis Date   Abnormal uterine bleeding (AUB)    march/april 2021   Allergy    Angio-edema    Asthma    Cancer (HCC)    skin cancer removed    Cough    non prod   Diabetes mellitus without complication (HCC)    Elevated BP without diagnosis of hypertension    high with DOCTOR  visits , then goes back to normal    Family history of breast cancer    MGM   Family history of pancreatic cancer    Family history of prostate cancer    Fatigue    Generalized headaches    migraines on occasion.   GERD (gastroesophageal reflux disease)    Graves disease 03/2010   Heart murmur    History of COVID-19 06/26/2020   Hyperlipidemia    Hypothyroidism    s/p thyroidectomy for Grave's disease (Dr. Zackary at [facility])   IBS (irritable bowel syndrome)    Incontinence    Infertility, female    Migraine    without aura   Plantar fasciitis, bilateral 2021   Pre-diabetes    Recurrent upper respiratory infection (URI)  last uri dec 2020   Sinus problem    runny nose   Sleep apnea    mild no cpap needed    Uterine polyp     Past Surgical History:  Procedure Laterality Date   ABDOMINOPLASTY  12/2015   Dr. Marcus   BREAST REDUCTION SURGERY  11/03/2018   BREAST SURGERY  10/2018   breast reduction   CESAREAN SECTION     x 1   DILATATION & CURETTAGE/HYSTEROSCOPY WITH TRUECLEAR N/A 01/16/2014   Procedure: DILATATION & CURETTAGE/HYSTEROSCOPY WITH TRUCLEAR;  Surgeon: Truman Corona, MD;  Location: [facility];  Service: Gynecology;  Laterality: N/A;   LASIK     LASIK Bilateral    mid-30's   MASS EXCISION Left 08/25/2022   Procedure: EXCISION OF SOFT TISSUE MASS BASE OF LEFT NECK;  Surgeon: Paola Dreama SAILOR, MD;  Location: [facility];  Service: General;  Laterality: Left;   prk laser eye surgery Left 08/2019   ROBOTIC ASSISTED SALPINGO OOPHERECTOMY Bilateral 03/28/2020   Procedure: XI  ROBOTIC ASSISTED SALPINGO OOPHORECTOMY;;  Surgeon: Viktoria Comer SAUNDERS, MD;  Location: [facility];  Service: Gynecology;  Laterality: Bilateral;   skin cancer remove  06/2020   TOTAL THYROIDECTOMY  07/30/2011   Dr. Eletha (Grave's disease)   DESPINA MARYBETH BIOPSY N/A 01/16/2014   Procedure: VULVAR BIOPSY;  Surgeon: Truman Corona, MD;  Location: [facility];  Service: Gynecology;  Laterality: N/A;    Current Outpatient Medications  Medication Sig Dispense Refill   albuterol  (VENTOLIN  HFA) 108 (90 Base) MCG/ACT inhaler Inhale 2 puffs into the lungs every 4 (four) hours as needed for wheezing or shortness of breath. 18 g 1   azelastine  (ASTELIN ) 0.1 % nasal spray Place 2 sprays into both nostrils 2 (two) times daily. Use in each nostril as directed 30 mL 5   budesonide -formoterol  (SYMBICORT ) 160-4.5 MCG/ACT inhaler Inhale 2 puffs into the lungs 2 (two) times daily.     Calcium  Carb-Cholecalciferol (CALCIUM  PLUS VITAMIN D3 PO) Take 1 capsule by mouth daily.     cyanocobalamin  (VITAMIN B12) 1000 MCG tablet Take 1,000 mcg by mouth daily.     cyclobenzaprine (FLEXERIL) 5 MG tablet      famotidine  (PEPCID ) 40 MG tablet Take 1 tablet (40 mg total) by mouth 2 (two) times daily. 180 tablet 1   Fexofenadine HCl (ALLEGRA PO) Take by mouth.     Fluticasone -Umeclidin-Vilant (TRELEGY ELLIPTA ) 100-62.5-25 MCG/ACT AEPB Inhale 1 puff into the lungs daily. 90 each 0   levocetirizine (XYZAL ) 5 MG tablet TAKE ONE TABLET BY MOUTH EVERY EVENING 90 tablet 1   metFORMIN  (GLUCOPHAGE -XR) 500 MG 24 hr tablet Take 2 tablets (1,000 mg total) by mouth daily with breakfast. 180 tablet 1   rosuvastatin  (CRESTOR ) 10 MG tablet Take 1 tablet (10 mg total) by mouth daily. 30 tablet 3   TEZSPIRE  210 MG/1. SOAJ Inject into the skin.     thyroid  (NP THYROID ) 90 MG tablet Take 1 tablet (90 mg total) by mouth in the morning and at bedtime. 60 tablet 1   Cholecalciferol (VITAMIN D3) 50 MCG (2000 UT) CAPS Take 1  capsule by mouth daily. (Patient not taking: Reported on 01/05/2024)     EPINEPHrine  (EPIPEN  2-PAK) 0.3 mg/0.3 mL IJ SOAJ injection Inject 0.3 mg into the muscle as needed for anaphylaxis (per injection protocol). (Patient not taking: Reported on 01/05/2024) 1 each 1   Current Facility-Administered Medications  Medication Dose Route Frequency Provider Last Rate Last Admin   tezepelumab -ekko (  TEZSPIRE ) 210 MG/1. syringe 210 mg  210 mg Subcutaneous Q28 days Iva Marty Saltness, MD   210 mg at 06/15/23 1456    ALLERGIES: B-12 [cyanocobalamin ]  Family History  Problem Relation Age of Onset   Prostate cancer Father        dx. in his 73s   Allergic rhinitis Mother    Asthma Mother    Alpha-1 antitrypsin deficiency Mother    COPD Mother    Diabetes Maternal Grandmother    Pancreatic cancer Maternal Grandmother        dx. in her early 98s   Heart disease Maternal Grandfather        CABG in 60's   Diabetes Maternal Grandfather    Colon polyps Maternal Grandfather    Diabetes Paternal Grandmother    Diabetes Paternal Grandfather    Heart disease Paternal Grandfather    Prostate cancer Paternal Uncle        dx. in his 27s   Angioedema Neg Hx    Eczema Neg Hx    Colon cancer Neg Hx    Esophageal cancer Neg Hx    Rectal cancer Neg Hx    Stomach cancer Neg Hx     Review of Systems  All other systems reviewed and are negative.   PHYSICAL EXAM:  BP 116/78 (BP Location: Left Arm, Patient Position: Sitting, Cuff Size: Normal)   Pulse 90   Ht 5' 1 (1.549 m) Comment: per patient--pinned on hat  Wt 178 lb (80.7 kg)   LMP 09/30/2016 Comment: aub 2021  SpO2 99%   BMI 33.63 kg/m     General appearance: alert, cooperative and appears stated age Head: normocephalic, without obvious abnormality, atraumatic Neck: no adenopathy, supple, symmetrical, trachea midline and thyroid  normal to inspection and palpation Lungs: clear to auscultation bilaterally Breasts: right - scattered scars of  skin, no masses or tenderness, No nipple retraction or dimpling, No nipple discharge or bleeding, No axillary adenopathy Left breast - boil with bleeding from left lower breast skin.  No mass.  No nipple retraction or dimpling, No nipple discharge or bleeding, No axillary adenopathy Heart: regular rate and rhythm Abdomen: soft, non-tender; no masses, no organomegaly Extremities: extremities normal, atraumatic, no cyanosis or edema Skin: skin color, texture, turgor normal. No rashes or lesions Lymph nodes: cervical, supraclavicular, and axillary nodes normal. Neurologic: grossly normal  Pelvic: External genitalia:  no lesions              No abnormal inguinal nodes palpated.              Urethra:  normal appearing urethra with no masses, tenderness or lesions              Bartholins and Skenes: normal                 Vagina: normal appearing vagina with normal color and discharge, no lesions              Cervix: no lesions              Pap taken: No. Bimanual Exam:  Uterus:  normal size, contour, position, consistency, mobility, non-tender              Adnexa: no mass, fullness, tenderness              Rectal exam: Yes.  .  Confirms.              Anus:  normal sphincter tone, no lesions  Chaperone was present for exam:  Zada PARAS, CMA  ASSESSMENT: Well woman visit with gynecologic exam Status post robotic assisted BSO.  Benign serous cystadenofibromas. Status post bilateral breast reduction.  Breast boil.  I suspect patient has hidradenitis suppurativa.  Menopausal symptoms.  Hx fibroids.  FH of prostate and pancreatic cancers.  Negative genetic testing.  Hx mixed incontinence.  PHQ2:  1  PLAN: Mammogram screening discussed. Self breast awareness reviewed. Pap and HRV collected:  No.   Due in 2026.  Guidelines for Calcium , Vitamin D , regular exercise program including cardiovascular and weight bearing exercise. We discussed tx options for menopausal symptoms:  HRT in lowered dosage,  Paxil  or Effexor, Neurontin , Veozah.  Risks and benefits reviewed.  Rx for Paxil  10 mg daily.  Rx for Cleocin  T lotion for hidradenitis.   She will see her established dermatologist for further care of suspected hidradenitis.   Follow up:  6 weeks for Paxil  recheck and in 1 year for well woman visit.

## 2023-12-22 NOTE — Patient Instructions (Signed)
Start metformin ER 500 mg once daily for a week, taken prior to a meal. After 1 week, increase to 2 pills before a meal.  Start Rosuvastatin once daily.  Continue your low cholesterol diet, as we recently discussed. If you develop any side effects from the statin medication, start taking Coenzyme Q10 daily. If you continue to have issues, contact us.  We are referring you to diabetes education.  Return in 3 months--be sure to be fasting for the visit (to recheck your cholesterol)

## 2024-01-05 ENCOUNTER — Ambulatory Visit (INDEPENDENT_AMBULATORY_CARE_PROVIDER_SITE_OTHER): Payer: 59 | Admitting: Obstetrics and Gynecology

## 2024-01-05 ENCOUNTER — Encounter: Payer: Self-pay | Admitting: Obstetrics and Gynecology

## 2024-01-05 VITALS — BP 116/78 | HR 90 | Ht 61.0 in | Wt 178.0 lb

## 2024-01-05 DIAGNOSIS — L0292 Furuncle, unspecified: Secondary | ICD-10-CM | POA: Diagnosis not present

## 2024-01-05 DIAGNOSIS — N951 Menopausal and female climacteric states: Secondary | ICD-10-CM

## 2024-01-05 DIAGNOSIS — Z01419 Encounter for gynecological examination (general) (routine) without abnormal findings: Secondary | ICD-10-CM | POA: Diagnosis not present

## 2024-01-05 DIAGNOSIS — Z1331 Encounter for screening for depression: Secondary | ICD-10-CM | POA: Diagnosis not present

## 2024-01-05 MED ORDER — PAROXETINE HCL 10 MG PO TABS: 10.0000 mg | ORAL_TABLET | ORAL | 1 refills | Status: DC

## 2024-01-05 MED ORDER — CLINDAMYCIN PHOSPHATE 1 % EX LOTN: TOPICAL_LOTION | Freq: Two times a day (BID) | CUTANEOUS | 0 refills | Status: DC

## 2024-01-05 NOTE — Patient Instructions (Addendum)
 Hidradenitis Suppurativa Hidradenitis suppurativa is a long-term (chronic) skin disease. It is similar to a severe form of acne, but it affects areas of the body where acne would be unusual, especially areas of the body where skin rubs against skin and becomes moist. These include: Underarms. Groin. Genital area. Buttocks. Upper thighs. Breasts. Hidradenitis suppurativa may start out as small lumps or pimples caused by blocked skin pores, sweat glands, or hair follicles. Pimples may develop into deep sores that break open (rupture) and drain pus. Over time, affected areas of skin may thicken and become scarred. This condition is rare and does not spread from person to person (non-contagious). What are the causes? The exact cause of this condition is not known. It may be related to: Female and female hormones. An overactive disease-fighting system (immune system). The immune system may over-react to blocked hair follicles or sweat glands and cause swelling and pus-filled sores. What increases the risk? You are more likely to develop this condition if you: Are female. Are 70-46 years old. Have a family history of hidradenitis suppurativa. Have a personal history of acne. Are overweight. Smoke. Take the medicine lithium. What are the signs or symptoms? The first symptoms are usually painful bumps in the skin, similar to pimples. The condition may get worse over time (progress), or it may only cause mild symptoms. If the disease progresses, symptoms may include: Skin bumps getting bigger and growing deeper into the skin. Bumps rupturing and draining pus. Itchy, infected skin. Skin getting thicker and scarred. Tunnels under the skin (fistulas) where pus drains from a bump. Pain during daily activities, such as pain during walking if your groin area is affected. Emotional problems, such as stress or depression. This condition may affect your appearance and your ability or willingness to wear  certain clothes or do certain activities. How is this diagnosed? This condition is diagnosed by a health care provider who specializes in skin conditions (dermatologist). You may be diagnosed based on: Your symptoms and medical history. A physical exam. Testing a pus sample for infection. Blood tests. How is this treated? Your treatment will depend on how severe your symptoms are. The same treatment will not work for everybody with this condition. You may need to try several treatments to find what works best for you. Treatment may include: Cleaning and bandaging (dressing) your wounds as needed. Lifestyle changes, such as new skin care routines. Taking medicines, such as: Antibiotics. Acne medicines. Medicines to reduce the activity of the immune system. A diabetes medicine (metformin ). Birth control pills, for women. Steroids to reduce swelling and pain. Working with a mental health care provider, if you experience emotional distress due to this condition. If you have severe symptoms that do not get better with medicine, you may need surgery. Surgery may involve: Using a laser to clear the skin and remove hair follicles. Opening and draining deep sores. Removing the areas of skin that are diseased and scarred. Follow these instructions at home: Medicines  Take over-the-counter and prescription medicines only as told by your health care provider. If you were prescribed antibiotics, take them as told by your health care provider. Do not stop using the antibiotic even if your condition improves. Skin care If you have open wounds, cover them with a clean dressing as told by your health care provider. Keep wounds clean by washing them gently with soap and water  when you bathe. Do not shave the areas where you get hidradenitis suppurativa. Wear loose-fitting clothes. Try to avoid  getting overheated or sweaty. If you get sweaty or wet, change into clean, dry clothes as soon as you can. To  help relieve pain and itchiness, cover sore areas with a warm, clean washcloth (warm compress) for 5-10 minutes as often as needed. Your healthcare provider may recommend an antiperspirant deodorant that may be gentle on your skin. A daily antiseptic wash to cleanse affected areas may be suggested by your healthcare provider. General instructions Learn as much as you can about your disease so that you have an active role in your treatment. Work closely with your health care provider to find treatments that work for you. If you are overweight, work with your health care provider to lose weight as recommended. Do not use any products that contain nicotine or tobacco. These products include cigarettes, chewing tobacco, and vaping devices, such as e-cigarettes. If you need help quitting, ask your health care provider. If you struggle with living with this condition, talk with your health care provider or work with a mental health care provider as recommended. Keep all follow-up visits. Where to find more information Hidradenitis Suppurativa Foundation, Inc.: www.hs-foundation.org [employer]ermatology: infoexam.si Contact a health care provider if: You have a flare-up of hidradenitis suppurativa. You have a fever or chills. You have trouble controlling your symptoms at home. You have trouble doing your daily activities because of your symptoms. You have trouble dealing with emotional problems related to your condition. Summary Hidradenitis suppurativa is a long-term (chronic) skin disease. It is similar to a severe form of acne, but it affects areas of the body where acne would be unusual. The first symptoms are usually painful bumps in the skin, similar to pimples. The condition may only cause mild symptoms, or it may get worse over time (progress). If you have open wounds, cover them with a clean dressing as told by your health care provider. Keep wounds clean by washing them gently with  soap and water  when you bathe. Besides skin care, treatment may include medicines, laser treatment, and surgery. This information is not intended to replace advice given to you by your health care provider. Make sure you discuss any questions you have with your health care provider. Document Revised: 01/07/2022 Document Reviewed: 01/07/2022 Elsevier Patient Education  2024 [employer] Inc.  Paroxetine  Tablets What is this medication? PAROXETINE  (pa ROX e teen) treats depression, anxiety, obsessive-compulsive disorder (OCD), post-traumatic stress disorder (PTSD), and premenstrual dysphoric disorder (PMDD). It increases the amount of serotonin in the brain, a hormone that helps regulate mood. It belongs to a group of medications called SSRIs. This medicine may be used for other purposes; ask your health care provider or pharmacist if you have questions. COMMON BRAND NAME(S): Paxil , Pexeva  What should I tell my care team before I take this medication? They need to know if you have any of these conditions: Bipolar disorder or a family history of bipolar disorder Bleeding disorder Glaucoma Heart disease Kidney disease Liver disease Low levels of sodium in the blood Seizures Suicidal thoughts, plans, or attempt by you or a family member Take MAOIs, such as Carbex, Eldepryl, Marplan, Nardil, and Parnate Take medications that treat or prevent blood clots Thyroid  disease An unusual or allergic reaction to paroxetine , other medications, foods, dyes, or preservatives Pregnant or trying to get pregnant Breastfeeding How should I use this medication? Take this medication by mouth with water . Take it as directed on the prescription label at the same time every day. You can take it with or  without food. If it upsets your stomach, take it with food. Do not take your medication more often than directed. Keep taking this medication unless your care team tells you to stop. Stopping it too quickly can cause  serious side effects. It can also make your condition worse. A special MedGuide will be given to you by the pharmacist with each prescription and refill. Be sure to read this information carefully each time. Talk to your care team about the use of this medication in children. Special care may be needed. Overdosage: If you think you have taken too much of this medicine contact a poison control center or emergency room at once. NOTE: This medicine is only for you. Do not share this medicine with others. What if I miss a dose? If you miss a dose, take it as soon as you can. If it is almost time for your next dose, take only that dose. Do not take double or extra doses. What may interact with this medication? Do not take this medication with any of the following: Linezolid MAOIs, such as Carbex, Eldepryl, Marplan, Nardil, and Parnate Methylene blue (injected into a vein) Pimozide Thioridazine This medication may also interact with the following: Alcohol Amphetamines Aspirin and aspirin-like medications Atomoxetine Certain medications for irregular heart beat, such as propafenone, flecainide, encainide, and quinidine Certain medications for mental health conditions Certain medications for migraine headache, such as almotriptan, eletriptan, frovatriptan, naratriptan, rizatriptan, sumatriptan , zolmitriptan Cimetidine Digoxin Diuretics Fentanyl  Fosamprenavir Furazolidone Isoniazid Lithium Medications that treat or prevent blood clots, such as warfarin, enoxaparin, and dalteparin Medications for sleep NSAIDs, medications for pain and inflammation, such as ibuprofen  or naproxen Phenobarbital Phenytoin Procarbazine Rasagiline Ritonavir  Supplements, such as St. John's wort, kava kava, valerian Tamoxifen Tramadol  Tryptophan This list may not describe all possible interactions. Give your health care provider a list of all the medicines, herbs, non-prescription drugs, or dietary  supplements you use. Also tell them if you smoke, drink alcohol, or use illegal drugs. Some items may interact with your medicine. What should I watch for while using this medication? Tell your care team if your symptoms do not get better or if they get worse. Visit your care team for regular checks on your progress. Because it may take several weeks to see the full effects of this medication, it is important to continue your treatment as prescribed by your care team. Watch for new or worsening thoughts of suicide or depression. This includes sudden changes in mood, behaviors, or thoughts. These changes can happen at any time but are more common in the beginning of treatment or after a change in dose. Call your care team right away if you experience these thoughts or worsening depression. This medication may cause mood and behavior changes, such as anxiety, nervousness, irritability, hostility, restlessness, excitability, hyperactivity, or trouble sleeping. These changes can happen at any time but are more common in the beginning of treatment or after a change in dose. Call your care team right away if you notice any of these symptoms. This medication may affect your coordination, reaction time, or judgment. Do not drive or operate machinery until you know how this medication affects you. Sit up or stand slowly to reduce the risk of dizzy or fainting spells. Drinking alcohol with this medication can increase the risk of these side effects. Your mouth may get dry. Chewing sugarless gum or sucking hard candy and drinking plenty of water  may help. Contact your care team if the problem does not go away  or is severe. What side effects may I notice from receiving this medication? Side effects that you should report to your care team as soon as possible: Allergic reactions--skin rash, itching, hives, swelling of the face, lips, tongue, or throat Bleeding--bloody or black, tar-like stools, red or dark brown urine,  vomiting blood or brown material that looks like coffee grounds, small, red or purple spots on skin, unusual bleeding or bruising Heart rhythm changes--fast or irregular heartbeat, dizziness, feeling faint or lightheaded, chest pain, trouble breathing Low sodium level--muscle weakness, fatigue, dizziness, headache, confusion Serotonin syndrome--irritability, confusion, fast or irregular heartbeat, muscle stiffness, twitching muscles, sweating, high fever, seizures, chills, vomiting, diarrhea Sudden eye pain or change in vision such as blurry vision, seeing halos around lights, vision loss Thoughts of suicide or self-harm, worsening mood, feelings of depression Side effects that usually do not require medical attention (report to your care team if they continue or are bothersome): Change in sex drive or performance Diarrhea Excessive sweating Nausea Tremors or shaking Upset stomach This list may not describe all possible side effects. Call your doctor for medical advice about side effects. You may report side effects to FDA at 1-800-FDA-1088. Where should I keep my medication? Keep out of the reach of children and pets. Store at room temperature between 15 and 30 degrees C (59 and 86 degrees F). Keep container tightly closed. Throw away any unused medication after the expiration date. NOTE: This sheet is a summary. It may not cover all possible information. If you have questions about this medicine, talk to your doctor, pharmacist, or health care provider.  2024 Elsevier/Gold Standard (2022-09-21 00:00:00)  EXERCISE AND DIET:  We recommended that you start or continue a regular exercise program for good health. Regular exercise means any activity that makes your heart beat faster and makes you sweat.  We recommend exercising at least 30 minutes per day at least 3 days a week, preferably 4 or 5.  We also recommend a diet low in fat and sugar.  Inactivity, poor dietary choices and obesity can cause  diabetes, heart attack, stroke, and kidney damage, among others.    ALCOHOL AND SMOKING:  Women should limit their alcohol intake to no more than 7 drinks/beers/glasses of wine (combined, not each!) per week. Moderation of alcohol intake to this level decreases your risk of breast cancer and liver damage. And of course, no recreational drugs are part of a healthy lifestyle.  And absolutely no smoking or even second hand smoke. Most people know smoking can cause heart and lung diseases, but did you know it also contributes to weakening of your bones? Aging of your skin?  Yellowing of your teeth and nails?  CALCIUM  AND VITAMIN D :  Adequate intake of calcium  and Vitamin D  are recommended.  The recommendations for exact amounts of these supplements seem to change often, but generally speaking 600 mg of calcium  (either carbonate or citrate) and 800 units of Vitamin D  per day seems prudent. Certain women may benefit from higher intake of Vitamin D .  If you are among these women, your doctor will have told you during your visit.    PAP SMEARS:  Pap smears, to check for cervical cancer or precancers,  have traditionally been done yearly, although recent scientific advances have shown that most women can have pap smears less often.  However, every woman still should have a physical exam from her gynecologist every year. It will include a breast check, inspection of the vulva and vagina to  check for abnormal growths or skin changes, a visual exam of the cervix, and then an exam to evaluate the size and shape of the uterus and ovaries.  And after 54 years of age, a rectal exam is indicated to check for rectal cancers. We will also provide age appropriate advice regarding health maintenance, like when you should have certain vaccines, screening for sexually transmitted diseases, bone density testing, colonoscopy, mammograms, etc.   MAMMOGRAMS:  All women over 22 years old should have a yearly mammogram. Many facilities  now offer a 3D mammogram, which may cost around $50 extra out of pocket. If possible,  we recommend you accept the option to have the 3D mammogram performed.  It both reduces the number of women who will be called back for extra views which then turn out to be normal, and it is better than the routine mammogram at detecting truly abnormal areas.    COLONOSCOPY:  Colonoscopy to screen for colon cancer is recommended for all women at age 51.  We know, you hate the idea of the prep.  We agree, BUT, having colon cancer and not knowing it is worse!!  Colon cancer so often starts as a polyp that can be seen and removed at colonscopy, which can quite literally save your life!  And if your first colonoscopy is normal and you have no family history of colon cancer, most women don't have to have it again for 10 years.  Once every ten years, you can do something that may end up saving your life, right?  We will be happy to help you get it scheduled when you are ready.  Be sure to check your insurance coverage so you understand how much it will cost.  It may be covered as a preventative service at no cost, but you should check your particular policy.

## 2024-01-12 ENCOUNTER — Other Ambulatory Visit: Payer: Self-pay | Admitting: Family Medicine

## 2024-01-13 ENCOUNTER — Ambulatory Visit (INDEPENDENT_AMBULATORY_CARE_PROVIDER_SITE_OTHER): Payer: Self-pay | Admitting: *Deleted

## 2024-01-13 DIAGNOSIS — J309 Allergic rhinitis, unspecified: Secondary | ICD-10-CM

## 2024-01-25 ENCOUNTER — Ambulatory Visit
Admission: RE | Admit: 2024-01-25 | Discharge: 2024-01-25 | Disposition: A | Discharge status: Home / Self Care | Payer: 59 | Source: Ambulatory Visit | Attending: Family Medicine | Admitting: Family Medicine

## 2024-01-25 DIAGNOSIS — R911 Solitary pulmonary nodule: Secondary | ICD-10-CM

## 2024-01-26 ENCOUNTER — Encounter: Payer: Self-pay | Admitting: Family Medicine

## 2024-01-27 ENCOUNTER — Encounter: Payer: 59 | Attending: Family Medicine | Admitting: Skilled Nursing Facility1

## 2024-01-27 ENCOUNTER — Encounter: Payer: Self-pay | Admitting: Skilled Nursing Facility1

## 2024-01-27 DIAGNOSIS — E119 Type 2 diabetes mellitus without complications: Secondary | ICD-10-CM | POA: Insufficient documentation

## 2024-01-27 NOTE — Progress Notes (Signed)
 A1C: 7.3  Other diagnoses: Allergies Asthma Cancer Hyperlipidemia Heart murmur  GERD OSA Thyroid   DM medications: Metformin   Pt states she knows how to eat and knows a lot about Diabetes. Pt states she does not like seafood.  Pt states she has been feeling tired for years feeling maybe it was school because feeling better since graduation.  Pt states she will have dinner out about 3 times a week and breakfast about 2 times a week.  Pt states her husband does all of the cooking.  Pt states she is not active because she needs a foot surgery: Dietitian asked if she would be interest in water aerobics pt states she is not.     Diabetes Self-Management Education  Visit Type: First/Initial  Appt. Start Time: 3:06 Appt. End Time: 3:38  01/27/2024  Jacqueline Holmes, identified by name and date of birth, is a 53 y.o. female with a diagnosis of Diabetes: Type 2.   ASSESSMENT  Last menstrual period 09/30/2016. There is no height or weight on file to calculate BMI.   Diabetes Self-Management Education - 01/27/24 1518       Visit Information   Visit Type First/Initial      Initial Visit   Diabetes Type Type 2    Are you currently following a meal plan? No    Are you taking your medications as prescribed? Yes      Health Coping   How would you rate your overall health? Good      Psychosocial Assessment   Patient Belief/Attitude about Diabetes Motivated to manage diabetes    What is the hardest part about your diabetes right now, causing you the most concern, or is the most worrisome to you about your diabetes?   Taking/obtaining medications;Making healty food and beverage choices    Self-management support None    Patient Concerns Nutrition/Meal planning    Special Needs None    Learning Readiness Contemplating    How often do you need to have someone help you when you read instructions, pamphlets, or other written materials from your doctor or pharmacy? 1 - Never       Pre-Education Assessment   Patient understands the diabetes disease and treatment process. Needs Instruction    Patient understands incorporating nutritional management into lifestyle. Needs Instruction    Patient undertands incorporating physical activity into lifestyle. Needs Instruction    Patient understands using medications safely. Needs Instruction    Patient understands monitoring blood glucose, interpreting and using results Needs Instruction    Patient understands prevention, detection, and treatment of acute complications. Needs Instruction    Patient understands prevention, detection, and treatment of chronic complications. Needs Instruction    Patient understands how to develop strategies to address psychosocial issues. Needs Instruction    Patient understands how to develop strategies to promote health/change behavior. Needs Instruction      Complications   How often do you check your blood sugar? 0 times/day (not testing)    Have you had a dilated eye exam in the past 12 months? Yes    Have you had a dental exam in the past 12 months? Yes    Are you checking your feet? Yes    How many days per week are you checking your feet? 7      Dietary Intake   Breakfast acai bowl + veggie muffin    Lunch leftovers (chicken + broccoli or zucchini or cauliflower)    Snack (afternoon) trailmix or  kind bar    Dinner chicken and salad    Snack (evening) carb smart bar    Beverage(s) preamde ice coffee, water, soda      Activity / Exercise   Activity / Exercise Type ADL's    How many days per week do you exercise? 0    How many minutes per day do you exercise? 0    Total minutes per week of exercise 0      Patient Education   Previous Diabetes Education No    Disease Pathophysiology Definition of diabetes, type 1 and 2, and the diagnosis of diabetes;Factors that contribute to the development of diabetes    Healthy Eating Plate Method;Food label reading, portion sizes and measuring  food.;Role of diet in the treatment of diabetes and the relationship between the three main macronutrients and blood glucose level    Being Active Role of exercise on diabetes management, blood pressure control and cardiac health.;Identified with patient nutritional and/or medication changes necessary with exercise.    Lifestyle and Health Coping Lifestyle issues that need to be addressed for better diabetes care      Individualized Goals (developed by patient)   Nutrition Follow meal plan discussed;General guidelines for healthy choices and portions discussed    Physical Activity Exercise 5-7 days per week;30 minutes per day    Medications take my medication as prescribed      Post-Education Assessment   Patient understands the diabetes disease and treatment process. Comprehends key points    Patient understands incorporating nutritional management into lifestyle. Comprehends key points    Patient undertands incorporating physical activity into lifestyle. Comprehends key points    Patient understands using medications safely. Comphrehends key points    Patient understands monitoring blood glucose, interpreting and using results Comprehends key points    Patient understands prevention, detection, and treatment of acute complications. Comprehends key points    Patient understands prevention, detection, and treatment of chronic complications. Comprehends key points    Patient understands how to develop strategies to address psychosocial issues. Comprehends key points    Patient understands how to develop strategies to promote health/change behavior. Comprehends key points      Outcomes   Expected Outcomes Demonstrated limited interest in learning.  Expect minimal changes    Future DMSE PRN    Program Status Completed             Individualized Plan for Diabetes Self-Management Training:   Learning Objective:  Patient will have a greater understanding of diabetes  self-management. Patient education plan is to attend individual and/or group sessions per assessed needs and concerns.    Expected Outcomes:  Demonstrated limited interest in learning.  Expect minimal changes  Education material provided: Meal plan card and My Plate  If problems or questions, patient to contact team via:  Phone and Email  Future DSME appointment: PRN

## 2024-02-10 ENCOUNTER — Ambulatory Visit (INDEPENDENT_AMBULATORY_CARE_PROVIDER_SITE_OTHER)

## 2024-02-10 DIAGNOSIS — J309 Allergic rhinitis, unspecified: Secondary | ICD-10-CM | POA: Diagnosis not present

## 2024-02-15 ENCOUNTER — Ambulatory Visit: Payer: BC Managed Care – PPO | Admitting: Allergy & Immunology

## 2024-02-15 NOTE — Progress Notes (Signed)
 GYNECOLOGY  VISIT   HPI: 53 y.o.   Married  Caucasian female   G2P1 with Patient's last menstrual period was 09/30/2016.   here for: med check f/u.   Patient is having menopausal symptoms.  She did not tolerate HRT due to breast pain.   She is taking Paxil in the am.  Hot flashes are improved but still occurring.   Does have some anxiety.  Doing less activity.  Not crafting as much as usual.  Work is ok.  Not isolating.   Took Lexapro in the past for depression.    Using Cleocin T lotion and now a benzoyl peroxide for skin boils.  She states that dermatology thinks she does not have hidradenitis.   GYNECOLOGIC HISTORY: Patient's last menstrual period was 09/30/2016. Contraception:  PMP Menopausal hormone therapy:  n/a Last 2 paps:  01/22/20 neg: HR HPV neg, 09/02/18 neg: HR HPV neg History of abnormal Pap or positive HPV:  yes, 2005 Mammogram:  10/05/23 Breast Density Cat A, BI-RADS CAT 2 benign        OB History     Gravida  2   Para  1   Term      Preterm      AB      Living  1      SAB      IAB      Ectopic      Multiple      Live Births                 Patient Active Problem List   Diagnosis Date Noted   Mixed hyperlipidemia 12/16/2023   Postoperative hypothyroidism 06/02/2022   Pulmonary nodule, right 06/02/2022   Vitamin B12 deficiency 06/02/2022   Vitamin D deficiency 06/02/2022   Graves disease 05/28/2022   Not well controlled moderate persistent asthma 11/07/2021   Snoring 11/07/2021   Hematuria 04/14/2021   Acute left-sided back pain 04/14/2021   Sebaceous cyst 04/14/2021   Elevated blood pressure reading in office with diagnosis of hypertension 10/22/2020   Belching 10/22/2020   Nausea 10/22/2020   Abdominal pain 10/22/2020   Gastroesophageal reflux disease 10/22/2020   Early satiety 10/22/2020   Seasonal and perennial allergic rhinitis 08/23/2020   Genetic testing 03/05/2020   Family history of prostate cancer    Family  history of pancreatic cancer    Cysts of both ovaries 02/09/2020   Allergic conjunctivitis of both eyes 10/06/2019   Elevated ALT measurement 01/28/2018   Moderate persistent asthma with acute exacerbation 01/18/2018   Allergic conjunctivitis 01/18/2018   Elevated blood-pressure reading without diagnosis of hypertension 01/18/2018   Non-seasonal allergic rhinitis due to pollen 05/03/2017   Mild persistent asthma, uncomplicated 05/03/2017   Thyroid nodule, uninodular 09/28/2011   Hypothyroidism 09/10/2011   Allergic rhinitis 09/10/2011   Asthma 04/25/2008   Contact dermatitis and other eczema due to plants (except food) 08/04/2007    Past Medical History:  Diagnosis Date   Abnormal uterine bleeding (AUB)    march/april 2021   Allergy    Angio-edema    Asthma    Cancer (HCC)    skin cancer removed    Cough    non prod   Diabetes mellitus without complication (HCC)    Elevated BP without diagnosis of hypertension    high with DOCTOR  visits , then goes back to normal    Family history of breast cancer    MGM   Family history of pancreatic cancer  Family history of prostate cancer    Fatigue    Generalized headaches    migraines on occasion.   GERD (gastroesophageal reflux disease)    Graves disease 03/2010   Heart murmur    History of COVID-19 06/26/2020   Hyperlipidemia    Hypothyroidism    s/p thyroidectomy for Grave's disease (Dr. Lelon Perla at [facility])   IBS (irritable bowel syndrome)    Incontinence    Infertility, female    Migraine    without aura   Plantar fasciitis, bilateral 2021   Pre-diabetes    Recurrent upper respiratory infection (URI)    last uri dec 2020   Sinus problem    runny nose   Sleep apnea    mild no cpap needed    Uterine polyp     Past Surgical History:  Procedure Laterality Date   ABDOMINOPLASTY  12/2015   Dr. Shon Hough   BREAST REDUCTION SURGERY  11/03/2018   BREAST SURGERY  10/2018   breast reduction    CESAREAN SECTION     x 1   DILATATION & CURETTAGE/HYSTEROSCOPY WITH TRUECLEAR N/A 01/16/2014   Procedure: DILATATION & CURETTAGE/HYSTEROSCOPY WITH TRUCLEAR;  Surgeon: Zelphia Cairo, MD;  Location: [facility];  Service: Gynecology;  Laterality: N/A;   LASIK     LASIK Bilateral    mid-30's   MASS EXCISION Left 08/25/2022   Procedure: EXCISION OF SOFT TISSUE MASS BASE OF LEFT NECK;  Surgeon: Diamantina Monks, MD;  Location: [facility];  Service: General;  Laterality: Left;   prk laser eye surgery Left 08/2019   ROBOTIC ASSISTED SALPINGO OOPHERECTOMY Bilateral 03/28/2020   Procedure: XI ROBOTIC ASSISTED SALPINGO OOPHORECTOMY;;  Surgeon: Carver Fila, MD;  Location: [facility];  Service: Gynecology;  Laterality: Bilateral;   skin cancer remove  06/2020   TOTAL THYROIDECTOMY  07/30/2011   Dr. Gerrit Friends (Grave's disease)   Fausto Skillern Ples Specter BIOPSY N/A 01/16/2014   Procedure: VULVAR BIOPSY;  Surgeon: Zelphia Cairo, MD;  Location: [facility];  Service: Gynecology;  Laterality: N/A;    Current Outpatient Medications  Medication Sig Dispense Refill   albuterol (VENTOLIN HFA) 108 (90 Base) MCG/ACT inhaler Inhale 2 puffs into the lungs every 4 (four) hours as needed for wheezing or shortness of breath. 18 g 1   azelastine (ASTELIN) 0.1 % nasal spray Place 2 sprays into both nostrils 2 (two) times daily. Use in each nostril as directed 30 mL 5   Calcium Carb-Cholecalciferol (CALCIUM PLUS VITAMIN D3 PO) Take 1 capsule by mouth daily.     Cholecalciferol (VITAMIN D3) 50 MCG (2000 UT) CAPS Take 1 capsule by mouth daily.     clindamycin (CLEOCIN-T) 1 % lotion Apply topically 2 (two) times daily. 60 mL 0   cyanocobalamin (VITAMIN B12) 1000 MCG tablet Take 1,000 mcg by mouth daily.     cyclobenzaprine (FLEXERIL) 5 MG tablet      EPINEPHrine (EPIPEN 2-PAK) 0.3 mg/0.3 mL IJ SOAJ injection Inject 0.3 mg into the muscle as needed for anaphylaxis (per injection protocol). 1 each 1    famotidine (PEPCID) 40 MG tablet Take 1 tablet (40 mg total) by mouth 2 (two) times daily. 180 tablet 1   Fexofenadine HCl (ALLEGRA PO) Take by mouth.     Fluticasone-Umeclidin-Vilant (TRELEGY ELLIPTA) 100-62.5-25 MCG/ACT AEPB Inhale 1 puff into the lungs daily. 90 each 1   levocetirizine (XYZAL) 5 MG tablet Take 1 tablet (5 mg total) by mouth every evening. 90 tablet 1  metFORMIN (GLUCOPHAGE-XR) 500 MG 24 hr tablet Take 2 tablets (1,000 mg total) by mouth daily with breakfast. 180 tablet 1   rosuvastatin (CRESTOR) 10 MG tablet Take 1 tablet (10 mg total) by mouth daily. 30 tablet 3   TEZSPIRE 210 MG/1. SOAJ Inject into the skin.     thyroid (NP THYROID) 90 MG tablet Take 1 tablet (90 mg total) by mouth in the morning and at bedtime. 60 tablet 1   PARoxetine (PAXIL) 20 MG tablet Take 0.5 tablets (10 mg total) by mouth every morning. 90 tablet 3   Current Facility-Administered Medications  Medication Dose Route Frequency Provider Last Rate Last Admin   tezepelumab-ekko (TEZSPIRE) 210 MG/1. syringe 210 mg  210 mg Subcutaneous Q28 days Alfonse Spruce, MD   210 mg at 06/15/23 1456     ALLERGIES: B-12 [cyanocobalamin]  Family History  Problem Relation Age of Onset   Prostate cancer Father        dx. in his 51s   Allergic rhinitis Mother    Asthma Mother    Alpha-1 antitrypsin deficiency Mother    COPD Mother    Diabetes Maternal Grandmother    Pancreatic cancer Maternal Grandmother        dx. in her early 63s   Heart disease Maternal Grandfather        CABG in 60's   Diabetes Maternal Grandfather    Colon polyps Maternal Grandfather    Diabetes Paternal Grandmother    Diabetes Paternal Grandfather    Heart disease Paternal Grandfather    Prostate cancer Paternal Uncle        dx. in his 68s   Angioedema Neg Hx    Eczema Neg Hx    Colon cancer Neg Hx    Esophageal cancer Neg Hx    Rectal cancer Neg Hx    Stomach cancer Neg Hx     Social History   Socioeconomic  History   Marital status: Married    Spouse name: Not on file   Number of children: 1   Years of education: Not on file   Highest education level: Doctorate  Occupational History   Occupation: Medical laboratory scientific officer: Lawyer  Tobacco Use   Smoking status: Never    Passive exposure: Never   Smokeless tobacco: Never  Vaping Use   Vaping status: Never Used  Substance and Sexual Activity   Alcohol use: Yes    Comment: once a month or less   Drug use: No   Sexual activity: Yes    Partners: Male    Birth control/protection: None, Surgical    Comment: postmenopausal (LMP3/2018), bil salpingo-ooph  Other Topics Concern   Not on file  Social History Narrative   Divorced and re-married.  Lives with her husband.     Son is grown, married. 1 Granddaughter Media planner (born 2023)   Working on her dissertation. 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    Current job Actor at [employer].  Hoping to start teaching.      Updated 12/2023   Social Drivers of Health   Financial Resource Strain: Low Risk  (12/15/2023)   Overall Financial Resource Strain (CARDIA)    Difficulty of Paying Living Expenses: Not very hard  Food Insecurity: No Food Insecurity (12/15/2023)   Hunger Vital Sign    Worried About Running Out of Food in the Last Year: Never true    Ran Out of  Food in the Last Year: Never true  Transportation Needs: No Transportation Needs (12/15/2023)   PRAPARE - Administrator, Civil Service (Medical): No    Lack of Transportation (Non-Medical): No  Physical Activity: Unknown (12/15/2023)   Exercise Vital Sign    Days of Exercise per Week: 0 days    Minutes of Exercise per Session: Not on file  Stress: No Stress Concern Present (12/15/2023)   [employer] of Occupational Health - Occupational Stress Questionnaire    Feeling of Stress : Only a little  Social Connections: Moderately Isolated  (12/15/2023)   Social Connection and Isolation Panel [NHANES]    Frequency of Communication with Friends and Family: Twice a week    Frequency of Social Gatherings with Friends and Family: Once a week    Attends Religious Services: Never    Database administrator or Organizations: No    Attends Engineer, structural: Not on file    Marital Status: Married  Intimate Partner Violence: Unknown (04/03/2023)   Received from [employer]   HITS    Physically Hurt: Not on file    Insult or Talk Down To: Not on file    Threaten Physical Harm: Not on file    Scream or Curse: Not on file    Review of Systems  All other systems reviewed and are negative.   PHYSICAL EXAMINATION:   BP 130/86 (BP Location: Right Arm, Patient Position: Sitting, Cuff Size: Small)   Pulse 88   Ht 5' 1.5" (1.562 m)   Wt 178 lb (80.7 kg)   LMP 09/30/2016 Comment: aub 2021  SpO2 98%   BMI 33.09 kg/m     General appearance: alert, cooperative and appears stated age  ASSESSMENT:  Menopausal symptoms partially improved with Paxil 10 mg daily.  Anxiety.  Encounter for medication monitoring.   PLAN:  Will increase Paxil to 20 mg daily.  We discussed potential side effects of increasing the dosage.  She will send a message through My Chart to let me know how she is doing with the dose change.  FU for annual exam and prn.   20 min  total time was spent for this patient encounter, including preparation, face-to-face counseling with the patient, coordination of care, and documentation of the encounter.

## 2024-02-24 ENCOUNTER — Encounter: Payer: Self-pay | Admitting: Allergy & Immunology

## 2024-02-24 ENCOUNTER — Other Ambulatory Visit: Payer: Self-pay

## 2024-02-24 ENCOUNTER — Ambulatory Visit: Admitting: Allergy & Immunology

## 2024-02-24 VITALS — BP 132/84 | HR 86 | Temp 98.3°F | Ht 61.5 in | Wt 176.6 lb

## 2024-02-24 DIAGNOSIS — J3089 Other allergic rhinitis: Secondary | ICD-10-CM

## 2024-02-24 DIAGNOSIS — K219 Gastro-esophageal reflux disease without esophagitis: Secondary | ICD-10-CM

## 2024-02-24 DIAGNOSIS — J302 Other seasonal allergic rhinitis: Secondary | ICD-10-CM

## 2024-02-24 DIAGNOSIS — J4550 Severe persistent asthma, uncomplicated: Secondary | ICD-10-CM | POA: Diagnosis not present

## 2024-02-24 MED ORDER — LEVOCETIRIZINE DIHYDROCHLORIDE 5 MG PO TABS: 5.0000 mg | ORAL_TABLET | Freq: Every evening | ORAL | 1 refills | Status: DC

## 2024-02-24 MED ORDER — FAMOTIDINE 40 MG PO TABS: 40.0000 mg | ORAL_TABLET | Freq: Two times a day (BID) | ORAL | 1 refills | Status: DC

## 2024-02-24 MED ORDER — AZELASTINE HCL 0.1 % NA SOLN: 2.0000 | Freq: Two times a day (BID) | NASAL | 5 refills | Status: DC

## 2024-02-24 MED ORDER — TRELEGY ELLIPTA 100-62.5-25 MCG/ACT IN AEPB: 1.0000 | INHALATION_SPRAY | Freq: Every day | RESPIRATORY_TRACT | 1 refills | Status: DC

## 2024-02-24 NOTE — Patient Instructions (Addendum)
 1. Moderate persistent asthma without complication - Lung testing looks excellent today.  - Try weaning the Trelegy to 3-5 times per week and see how you do.  - Daily controller medication(s): Trelegy 100/62.5/25 one puff once daily - Prior to physical activity: albuterol 2 puffs 10-15 minutes before physical activity. - Rescue medications: albuterol 4 puffs every 4-6 hours as needed - Asthma control goals:  * Full participation in all desired activities (may need albuterol before activity) * Albuterol use two time or less a week on average (not counting use with activity) * Cough interfering with sleep two time or less a month * Oral steroids no more than once a year * No hospitalizations  2. Seasonal and perennial allergic rhinitis - Form signed to stop allergy shots.  - Continue with levocetirizine 5 mg daily. - Continue with Allegra daily.   3. Gastroesophageal reflux disease - Continue with the use of famotidine 40 mg twice daily.  4. Return in about 6 months (around 08/26/2024). You can have the follow up appointment with a Nurse Practitioner (our Nurse Practitioners are excellent and always have Physician oversight!).    Please inform us of any Emergency Department visits, hospitalizations, or changes in symptoms. Call us before going to the ED for breathing or allergy symptoms since we might be able to fit you in for a sick visit. Feel free to contact us anytime with any questions, problems, or concerns.  It was a pleasure to see you again) today!  Websites that have reliable patient information: 1. American Academy of Asthma, Allergy, and Immunology: www.aaaai.org 2. Food Allergy Research and Education (FARE): foodallergy.org 3. Mothers of Asthmatics: http://www.asthmacommunitynetwork.org 4. American College of Allergy, Asthma, and Immunology: www.acaai.org      "Like" [place] on Facebook and Instagram for our latest updates!      A healthy democracy works best when [employer] participate! Make sure you are registered to vote! If you have moved or changed any of your contact information, you will need to get this updated before voting! Scan the QR codes below to learn more!

## 2024-02-24 NOTE — Progress Notes (Signed)
 FOLLOW UP  Date of Service/Encounter:  02/28/24   Assessment:   Moderate persistent asthma, uncomplicated - without adequate control (did not do spiro due to recent COVID19 infection)   AEC 200 (September 2023)   Non-seasonal allergic rhinitis (trees, weeds, grasses, molds, dust mites, cat, dog and cockroach) - on allergen immunotherapy with maintenance reached in June 2019   Adverse food reaction (shellfish/fish) - with negative testing   GERD - doing well on higher dose famotidine BID  Fascinator   Plan/Recommendations:   1. Moderate persistent asthma without complication - Lung testing looks excellent today.  - Try weaning the Trelegy to 3-5 times per week and see how you do.  - Daily controller medication(s): Trelegy 100/62.5/25 one puff once daily - Prior to physical activity: albuterol 2 puffs 10-15 minutes before physical activity. - Rescue medications: albuterol 4 puffs every 4-6 hours as needed - Asthma control goals:  * Full participation in all desired activities (may need albuterol before activity) * Albuterol use two time or less a week on average (not counting use with activity) * Cough interfering with sleep two time or less a month * Oral steroids no more than once a year * No hospitalizations  2. Seasonal and perennial allergic rhinitis - Form signed to stop allergy shots.  - Continue with levocetirizine 5 mg daily. - Continue with Allegra daily.   3. Gastroesophageal reflux disease - Continue with the use of famotidine 40 mg twice daily.  4. Return in about 6 months (around 08/26/2024). You can have the follow up appointment with a Nurse Practitioner (our Nurse Practitioners are excellent and always have Physician oversight!).    Subjective:   Jacqueline Holmes is a 53 y.o. female presenting today for follow up of  Chief Complaint  Patient presents with   Asthma   Allergic Rhinitis    Follow-up    Jacqueline Holmes has a history of the  following: Patient Active Problem List   Diagnosis Date Noted   Mixed hyperlipidemia 12/16/2023   Postoperative hypothyroidism 06/02/2022   Pulmonary nodule, right 06/02/2022   Vitamin B12 deficiency 06/02/2022   Vitamin D deficiency 06/02/2022   Graves disease 05/28/2022   Not well controlled moderate persistent asthma 11/07/2021   Snoring 11/07/2021   Hematuria 04/14/2021   Acute left-sided back pain 04/14/2021   Sebaceous cyst 04/14/2021   Elevated blood pressure reading in office with diagnosis of hypertension 10/22/2020   Belching 10/22/2020   Nausea 10/22/2020   Abdominal pain 10/22/2020   Gastroesophageal reflux disease 10/22/2020   Early satiety 10/22/2020   Seasonal and perennial allergic rhinitis 08/23/2020   Genetic testing 03/05/2020   Family history of prostate cancer    Family history of pancreatic cancer    Cysts of both ovaries 02/09/2020   Allergic conjunctivitis of both eyes 10/06/2019   Elevated ALT measurement 01/28/2018   Moderate persistent asthma with acute exacerbation 01/18/2018   Allergic conjunctivitis 01/18/2018   Elevated blood-pressure reading without diagnosis of hypertension 01/18/2018   Non-seasonal allergic rhinitis due to pollen 05/03/2017   Mild persistent asthma, uncomplicated 05/03/2017   Thyroid nodule, uninodular 09/28/2011   Hypothyroidism 09/10/2011   Allergic rhinitis 09/10/2011   Asthma 04/25/2008   Contact dermatitis and other eczema due to plants (except food) 08/04/2007    History obtained from: chart review and patient.  Discussed the use of AI scribe software for clinical note transcription with the patient and/or guardian, who gave verbal consent to proceed.  Jacqueline Holmes is  a 53 y.o. female presenting for a follow up visit.  She was last seen in September 2024.  At that time, she was continued on Trelegy 100 mcg 1 puff once daily as well as albuterol as needed.  She also remained on her Tezspire injections every 28 days.  She  continued on her Flonase as well as her reflux medications.  She also remained on allergy shots.  Since last visit, she has done well.  Asthma/Respiratory Symptom History: She has been managing her asthma with injections and Trelegy. She administers the injections at home and states that her asthma is 'doing okay'. She has been on the injections for over five years. She also uses Trelegy, which costs her about forty dollars, and she has not noticed any adverse effects when she occasionally forgets to use it.  Allergic Rhinitis Symptom History: In the past, she experienced significant ear blockage, which led her to see an ear, nose, and throat specialist in October. The specialist removed the blockage, and she has a follow-up appointment scheduled. She does not attempt to remove earwax herself.  Jacqueline Holmes is on allergen immunotherapy. She receives two injections. Immunotherapy script #1 contains weeds, grasses, dust mites, and cat. She currently receives 0.63mL of the RED vial (1/100). Immunotherapy script #2 contains molds and cockroach. She currently receives 0.36mL of the RED vial (1/100). She started shots August of 2018 and reached maintenance in June of 2019. She has done well with her allergies. She is not having any large local reactions with her current set of shots at this point. She does feel that they are helping with symptom control.   GERD Symptom History: She is also taking famotidine for reflux, which she states is working well.   Otherwise, there have been no changes to her past medical history, surgical history, family history, or social history.    Review of systems otherwise negative other than that mentioned in the HPI.    Objective:   Blood pressure 132/84, pulse 86, temperature 98.3 F (36.8 C), temperature source Temporal, height 5' 1.5" (1.562 m), weight 176 lb 9.6 oz (80.1 kg), last menstrual period 09/30/2016, SpO2 99%. Body mass index is 32.83 kg/m.    Physical  Exam Vitals reviewed.  Constitutional:      Appearance: She is well-developed.     Comments: Moving air well in all lung fields.  No increased work of breathing.  HENT:     Head: Normocephalic and atraumatic.     Right Ear: Tympanic membrane, ear canal and external ear normal.     Left Ear: Tympanic membrane, ear canal and external ear normal.     Nose: No nasal deformity, septal deviation, mucosal edema or rhinorrhea.     Right Turbinates: Enlarged, swollen and pale.     Left Turbinates: Enlarged, swollen and pale.     Right Sinus: No maxillary sinus tenderness or frontal sinus tenderness.     Left Sinus: No maxillary sinus tenderness or frontal sinus tenderness.     Comments: Turbinates are looking better today.    Mouth/Throat:     Mouth: Mucous membranes are not pale and not dry.     Pharynx: Uvula midline.     Comments: Cobblestoning in the posterior oropharynx.  Eyes:     General: Lids are normal. No allergic shiner.       Right eye: No discharge.        Left eye: No discharge.     Conjunctiva/sclera: Conjunctivae normal.  Right eye: Right conjunctiva is not injected. No chemosis.    Left eye: Left conjunctiva is not injected. No chemosis.    Pupils: Pupils are equal, round, and reactive to light.  Cardiovascular:     Rate and Rhythm: Normal rate and regular rhythm.     Heart sounds: Normal heart sounds.  Pulmonary:     Effort: Pulmonary effort is normal. No tachypnea, accessory muscle usage or respiratory distress.     Breath sounds: Normal breath sounds. No wheezing, rhonchi or rales.  Chest:     Chest wall: No tenderness.  Lymphadenopathy:     Cervical: No cervical adenopathy.  Skin:    Coloration: Skin is not pale.     Findings: No abrasion, erythema, petechiae or rash. Rash is not papular, urticarial or vesicular.  Neurological:     Mental Status: She is alert.  Psychiatric:        Behavior: Behavior is cooperative.      Diagnostic studies:     Spirometry: results normal (FEV1: 2.13/88%, FVC: 2.57/85%, FEV1/FVC: 83%).    Spirometry consistent with normal pattern.   Allergy Studies: none       Malachi Bonds, MD  [facility]

## 2024-02-28 ENCOUNTER — Encounter: Payer: Self-pay | Admitting: Allergy & Immunology

## 2024-02-29 ENCOUNTER — Ambulatory Visit: Payer: 59 | Admitting: Obstetrics and Gynecology

## 2024-02-29 ENCOUNTER — Encounter: Payer: Self-pay | Admitting: Obstetrics and Gynecology

## 2024-02-29 VITALS — BP 130/86 | HR 88 | Ht 61.5 in | Wt 178.0 lb

## 2024-02-29 DIAGNOSIS — F419 Anxiety disorder, unspecified: Secondary | ICD-10-CM

## 2024-02-29 DIAGNOSIS — Z5181 Encounter for therapeutic drug level monitoring: Secondary | ICD-10-CM | POA: Diagnosis not present

## 2024-02-29 DIAGNOSIS — N951 Menopausal and female climacteric states: Secondary | ICD-10-CM | POA: Diagnosis not present

## 2024-02-29 MED ORDER — PAROXETINE HCL 20 MG PO TABS: 10.0000 mg | ORAL_TABLET | ORAL | 3 refills | Status: DC

## 2024-03-07 ENCOUNTER — Ambulatory Visit (INDEPENDENT_AMBULATORY_CARE_PROVIDER_SITE_OTHER): Payer: Self-pay

## 2024-03-07 DIAGNOSIS — J309 Allergic rhinitis, unspecified: Secondary | ICD-10-CM | POA: Diagnosis not present

## 2024-03-14 ENCOUNTER — Other Ambulatory Visit: Payer: Self-pay | Admitting: Allergy & Immunology

## 2024-03-21 NOTE — Progress Notes (Unsigned)
 No chief complaint on file.  Patient presents for 3 month follow-up on diabetes.  Diabetes was diagnosed after her labs from her physical showed A1c of 7.3 and fasting glucose 133.  At that time she reported the following diet: More sweets over the holidays. Has a soda sometimes if out to eat, doesn't finish a full glass. Mostly drinking water . +breads (usually whole wheat). Husband likes rice and pasta, he does the cooking, so eats it too often. Tries to keep portions small.  Exercise: occasional walking in nice weather  She was restarted on Metformin  XR 1000 mg daily  (had taken in the past for pre-diabetes, not since 2021).  She is tolerating this without side effects. She saw dietitican in February  Changes made?  Checking sugars?  Hyperlipidemia:  She was started on rosuvastatin  10 mg in January, after diagnosis of diabetes. She is tolerating this without side effects.  Lab Results  Component Value Date   CHOL 216 (H) 12/16/2023   HDL 55 12/16/2023   LDLCALC 138 (H) 12/16/2023   TRIG 128 12/16/2023   CHOLHDL 3.9 12/16/2023     She was started on Paxil  by Dr. Colvin Dec in February to treat menopausal symptoms/hot flashes. Dose was increased to 20 mg on 4/1.  UPDATE    PMH, PSH, SH reviewed   ROS:    PHYSICAL EXAM:  LMP 09/30/2016 Comment: aub 2021  Wt Readings from Last 3 Encounters:  02/29/24 178 lb (80.7 kg)  02/24/24 176 lb 9.6 oz (80.1 kg)  01/05/24 178 lb (80.7 kg)      DIABETIC FOOT EXAM   ASSESSMENT/PLAN:  Did she ever get 2nd shingrix ? Prevnar-20  A1c LFT, glu, lipids, urine microalbumin  Diabetic foot exam.  ***REFILL ROSUVASTATIN  90d AFTER LABS BACK

## 2024-03-23 ENCOUNTER — Encounter: Payer: Self-pay | Admitting: Family Medicine

## 2024-03-23 ENCOUNTER — Other Ambulatory Visit: Payer: Self-pay | Admitting: *Deleted

## 2024-03-23 ENCOUNTER — Ambulatory Visit: Payer: 59 | Admitting: Family Medicine

## 2024-03-23 VITALS — BP 120/74 | HR 76 | Ht 61.5 in | Wt 176.4 lb

## 2024-03-23 DIAGNOSIS — E782 Mixed hyperlipidemia: Secondary | ICD-10-CM | POA: Diagnosis not present

## 2024-03-23 DIAGNOSIS — N951 Menopausal and female climacteric states: Secondary | ICD-10-CM | POA: Diagnosis not present

## 2024-03-23 DIAGNOSIS — E119 Type 2 diabetes mellitus without complications: Secondary | ICD-10-CM

## 2024-03-23 DIAGNOSIS — Z23 Encounter for immunization: Secondary | ICD-10-CM

## 2024-03-23 LAB — POCT GLYCOSYLATED HEMOGLOBIN (HGB A1C): Hemoglobin A1C: 6.4 % — AB (ref 4.0–5.6)

## 2024-03-23 LAB — LIPID PANEL

## 2024-03-23 MED ORDER — LANCETS MISC. MISC: 1.0000 | Freq: Three times a day (TID) | 0 refills | Status: AC

## 2024-03-23 MED ORDER — BLOOD GLUCOSE MONITORING SUPPL DEVI: 1.0000 | Freq: Three times a day (TID) | 0 refills | Status: AC

## 2024-03-23 MED ORDER — LANCET DEVICE MISC
1.0000 | Freq: Three times a day (TID) | 0 refills | Status: AC
Start: 2024-03-23 — End: 2024-04-22

## 2024-03-23 MED ORDER — BLOOD GLUCOSE TEST VI STRP: 1.0000 | ORAL_STRIP | Freq: Three times a day (TID) | 0 refills | Status: DC

## 2024-03-23 NOTE — Patient Instructions (Signed)
  Let us  know if you need/want a prescription for test strips or a new meter (if your strips for your current meter aren't covered by your insurance).

## 2024-03-24 ENCOUNTER — Encounter: Payer: Self-pay | Admitting: Family Medicine

## 2024-03-24 LAB — LIPID PANEL
Cholesterol, Total: 105 mg/dL (ref 100–199)
HDL: 40 mg/dL (ref >39)
LDL CALC COMMENT:: 2.6 ratio (ref 0.0–4.4)
LDL Chol Calc (NIH): 46 mg/dL (ref 0–99)
Triglycerides: 97 mg/dL (ref 0–149)
VLDL Cholesterol Cal: 19 mg/dL (ref 5–40)

## 2024-03-24 LAB — HEPATIC FUNCTION PANEL
ALT: 36 IU/L — ABNORMAL HIGH (ref 0–32)
AST: 24 IU/L (ref 0–40)
Albumin: 4.5 g/dL (ref 3.8–4.9)
Alkaline Phosphatase: 115 IU/L (ref 44–121)
Bilirubin Total: 0.5 mg/dL (ref 0.0–1.2)
Bilirubin, Direct: 0.22 mg/dL (ref 0.00–0.40)
Total Protein: 7.4 g/dL (ref 6.0–8.5)

## 2024-03-24 LAB — GLUCOSE, RANDOM: Glucose: 101 mg/dL — ABNORMAL HIGH (ref 70–99)

## 2024-03-24 LAB — MICROALBUMIN / CREATININE URINE RATIO
Creatinine, Urine: 16.4 mg/dL
Microalb/Creat Ratio: <18 mg/g{creat} (ref 0–29)
Microalbumin, Urine: <3 ug/mL

## 2024-03-24 MED ORDER — ROSUVASTATIN CALCIUM 10 MG PO TABS
10.0000 mg | ORAL_TABLET | Freq: Every day | ORAL | 3 refills | Status: AC
Start: 2024-03-24 — End: ?

## 2024-04-06 ENCOUNTER — Ambulatory Visit (INDEPENDENT_AMBULATORY_CARE_PROVIDER_SITE_OTHER): Payer: Self-pay

## 2024-04-06 DIAGNOSIS — J309 Allergic rhinitis, unspecified: Secondary | ICD-10-CM

## 2024-04-10 ENCOUNTER — Encounter: Payer: Self-pay | Admitting: Obstetrics and Gynecology

## 2024-04-10 ENCOUNTER — Other Ambulatory Visit: Payer: Self-pay | Admitting: Obstetrics and Gynecology

## 2024-04-10 MED ORDER — PAROXETINE HCL 20 MG PO TABS: 20.0000 mg | ORAL_TABLET | ORAL | 2 refills | Status: AC

## 2024-04-10 NOTE — Progress Notes (Signed)
 Rx for Paxil  20 mg daily.  #90, RF 2.

## 2024-04-18 DIAGNOSIS — J3089 Other allergic rhinitis: Secondary | ICD-10-CM | POA: Diagnosis not present

## 2024-04-18 NOTE — Progress Notes (Signed)
 VIALS MADE 04-18-24

## 2024-04-19 DIAGNOSIS — J302 Other seasonal allergic rhinitis: Secondary | ICD-10-CM | POA: Diagnosis not present

## 2024-05-16 ENCOUNTER — Encounter: Payer: Self-pay | Admitting: Family Medicine

## 2024-05-16 ENCOUNTER — Other Ambulatory Visit: Payer: Self-pay | Admitting: Family Medicine

## 2024-05-16 DIAGNOSIS — E119 Type 2 diabetes mellitus without complications: Secondary | ICD-10-CM

## 2024-06-20 ENCOUNTER — Other Ambulatory Visit: Payer: Self-pay | Admitting: Family Medicine

## 2024-06-20 DIAGNOSIS — E119 Type 2 diabetes mellitus without complications: Secondary | ICD-10-CM

## 2024-08-22 NOTE — Progress Notes (Unsigned)
 No chief complaint on file.  Diabetes was diagnosed after her labs from her physical in 12/2023 showed A1c of 7.3 and fasting glucose 133.  She endorsed having more sweets over the holidays, some soda if out to eat.  Eating carbs (rice, pasta) that husband cooked, but keeping portions small. She was restarted on Metformin  XR 1000 mg daily  (had taken in the past for pre-diabetes, not since 2021), and saw a dietician in 01/2024. A1c improved to 6.4% in April 2025. She is tolerating metformin  without side effects.  Diet--soda? Pasta, rice, any whole grains? Whole wheat bread. ***   Exercise: occasional walking in nice weather     Hyperlipidemia:  She was started on rosuvastatin  10 mg in January, after diagnosis of diabetes. She is tolerating this without side effects. Recheck of lipids in 02/2024 showed LDL 46 (was 138 prior to starting the statin). She doesn't eat cholesterol-rich foods. Red meat 1-2x/week.  Lab Results  Component Value Date   CHOL 105 03/23/2024   HDL 40 03/23/2024   LDLCALC 46 03/23/2024   TRIG 97 03/23/2024   CHOLHDL 2.6 03/23/2024     She was started on Paxil  by Dr. Nikki in February to treat menopausal symptoms/hot flashes. Dose was increased to 20 mg on 4/1. She thinks this has helped, as well as helping some with moods.        PMH, PSH, SH reviewed    PMH, PSH, SH reviewed   ROS:    PHYSICAL EXAM:  LMP 09/30/2016 Comment: aub 2021      ASSESSMENT/PLAN:  A1c Flu shot COVID from pharmacy   Has she had diabetic eye exam? If so, please get (Not sure why that doesn't show as care gap)   F/u as scheduled for CPE in 12/2024

## 2024-08-23 ENCOUNTER — Encounter: Payer: Self-pay | Admitting: Family Medicine

## 2024-08-23 ENCOUNTER — Ambulatory Visit: Admitting: Family Medicine

## 2024-08-23 VITALS — BP 130/80 | HR 80 | Ht 61.5 in | Wt 172.6 lb

## 2024-08-23 DIAGNOSIS — Z5181 Encounter for therapeutic drug level monitoring: Secondary | ICD-10-CM

## 2024-08-23 DIAGNOSIS — E119 Type 2 diabetes mellitus without complications: Secondary | ICD-10-CM

## 2024-08-23 DIAGNOSIS — Z0000 Encounter for general adult medical examination without abnormal findings: Secondary | ICD-10-CM

## 2024-08-23 DIAGNOSIS — Z23 Encounter for immunization: Secondary | ICD-10-CM | POA: Diagnosis not present

## 2024-08-23 DIAGNOSIS — E538 Deficiency of other specified B group vitamins: Secondary | ICD-10-CM | POA: Diagnosis not present

## 2024-08-23 DIAGNOSIS — E782 Mixed hyperlipidemia: Secondary | ICD-10-CM

## 2024-08-23 LAB — POCT GLYCOSYLATED HEMOGLOBIN (HGB A1C): Hemoglobin A1C: 6.3 % — AB (ref 4.0–5.6)

## 2024-08-23 NOTE — Patient Instructions (Signed)
 Continue the metformin , low carb diet with whole grains. Continue exercise--try and get some exercise every day.

## 2024-08-24 ENCOUNTER — Other Ambulatory Visit: Payer: Self-pay

## 2024-08-24 ENCOUNTER — Ambulatory Visit: Admitting: Allergy & Immunology

## 2024-08-24 ENCOUNTER — Encounter: Payer: Self-pay | Admitting: Allergy & Immunology

## 2024-08-24 VITALS — BP 130/74 | HR 94 | Temp 97.7°F | Resp 18 | Ht 60.63 in | Wt 171.7 lb

## 2024-08-24 DIAGNOSIS — J4550 Severe persistent asthma, uncomplicated: Secondary | ICD-10-CM | POA: Diagnosis not present

## 2024-08-24 DIAGNOSIS — J302 Other seasonal allergic rhinitis: Secondary | ICD-10-CM

## 2024-08-24 DIAGNOSIS — J3089 Other allergic rhinitis: Secondary | ICD-10-CM | POA: Diagnosis not present

## 2024-08-24 DIAGNOSIS — K219 Gastro-esophageal reflux disease without esophagitis: Secondary | ICD-10-CM

## 2024-08-24 MED ORDER — LEVOCETIRIZINE DIHYDROCHLORIDE 5 MG PO TABS: 5.0000 mg | ORAL_TABLET | Freq: Every evening | ORAL | 4 refills | Status: AC

## 2024-08-24 MED ORDER — AZELASTINE HCL 0.1 % NA SOLN: 2.0000 | Freq: Two times a day (BID) | NASAL | 4 refills | Status: AC

## 2024-08-24 NOTE — Progress Notes (Signed)
 FOLLOW UP  Date of Service/Encounter:  08/24/24   Assessment:   Moderate persistent asthma, uncomplicated -doing well on the Tezspire    AEC 200 (September 2023)   Non-seasonal allergic rhinitis (trees, weeds, grasses, molds, dust mites, cat, dog and cockroach) - on allergen immunotherapy with maintenance reached in June 2019   Adverse food reaction (shellfish/fish) - with negative testing   GERD - doing well on higher dose famotidine  BID   Fascinator   Plan/Recommendations:   1. Moderate persistent asthma without complication - Lung testing looks excellent today.  - There is no harm in remaining in this medication daily, so we will not make any changes.  - Daily controller medication(s): Trelegy 100/62.5/25 one puff once daily and Tezspire  daily - Prior to physical activity: albuterol  2 puffs 10-15 minutes before physical activity. - Rescue medications: albuterol  4 puffs every 4-6 hours as needed - Asthma control goals:  * Full participation in all desired activities (may need albuterol  before activity) * Albuterol  use two time or less a week on average (not counting use with activity) * Cough interfering with sleep two time or less a month * Oral steroids no more than once a year * No hospitalizations  2. Seasonal and perennial allergic rhinitis (trees, weeds, grasses, molds, dust mites, cat, dog and cockroach) - It seems that this is well-controlled with the medications and after the allergy shots.  - Continue with levocetirizine 5 mg daily. - Continue with Allegra daily.  - Continue with Astelin  one spray per nostril twice daily as needed.   3. Gastroesophageal reflux disease - Continue with the use of famotidine  40 mg twice daily.  4. Return in about 1 year (around 08/24/2025). You can have the follow up appointment with a Nurse Practitioner (our Nurse Practitioners are excellent and always have Physician oversight!).   Subjective:   Jacqueline Holmes is a 53 y.o.  female presenting today for follow up of  Chief Complaint  Patient presents with   Follow-up    Jacqueline Holmes has a history of the following: Patient Active Problem List   Diagnosis Date Noted   Mixed hyperlipidemia 12/16/2023   Postoperative hypothyroidism 06/02/2022   Pulmonary nodule, right 06/02/2022   Vitamin B12 deficiency 06/02/2022   Vitamin D  deficiency 06/02/2022   Graves disease 05/28/2022   Not well controlled moderate persistent asthma 11/07/2021   Snoring 11/07/2021   Hematuria 04/14/2021   Acute left-sided back pain 04/14/2021   Sebaceous cyst 04/14/2021   Elevated blood pressure reading in office with diagnosis of hypertension 10/22/2020   Belching 10/22/2020   Nausea 10/22/2020   Abdominal pain 10/22/2020   Gastroesophageal reflux disease 10/22/2020   Early satiety 10/22/2020   Seasonal and perennial allergic rhinitis 08/23/2020   Genetic testing 03/05/2020   Family history of prostate cancer    Family history of pancreatic cancer    Cysts of both ovaries 02/09/2020   Allergic conjunctivitis of both eyes 10/06/2019   Elevated ALT measurement 01/28/2018   Moderate persistent asthma with acute exacerbation 01/18/2018   Allergic conjunctivitis 01/18/2018   Elevated blood-pressure reading without diagnosis of hypertension 01/18/2018   Non-seasonal allergic rhinitis due to pollen 05/03/2017   Mild persistent asthma, uncomplicated 05/03/2017   Thyroid  nodule, uninodular 09/28/2011   Hypothyroidism 09/10/2011   Allergic rhinitis 09/10/2011   Asthma 04/25/2008   Contact dermatitis and other eczema due to plants (except food) 08/04/2007    History obtained from: chart review and patient.  Discussed the use of  AI scribe software for clinical note transcription with the patient and/or guardian, who gave verbal consent to proceed.  Jacqueline Holmes is a 53 y.o. female presenting for a follow up visit.  She was last seen in March 2025.  At that time, lung testing looked  excellent.  We recommended weaning the Trelegy to 3-5 times per week to see how she does.  She continue with albuterol  as needed.  For her allergic rhinitis, she decided to stop her allergy shots since she reached 5 years of treatment.  We continue with Allegra daily.  Keep reflux was controlled with famotidine  twice a day.  Since the last visit, she has done very well.  Asthma/Respiratory Symptom History: She decreased her Trelegy to two to three times a week but did not like the change and resumed daily use. She rarely uses her rescue inhaler, only needing it a couple of weeks ago due to a cold, which resolved in a few days without antibiotics or steroids. She has not been to the ED or Urgent Care for her symptoms at all.   Allergic Rhinitis Symptom History: She continues to take Xyzal  and Allegra daily, one in the morning and one in the evening, throughout the year. Xyzal  is affordable with a 90-day supply, but Allegra is not covered by insurance. She uses Astelin  most days, especially when symptoms flare up, and finds it effective after allergy shots. She reports improvement in her symptoms since starting allergy shots.  She takes a specific thyroid  medication as other types are ineffective for her. The cost has increased since the cheaper version was discontinued.  She takes Paxil  for hot flashes, not for anxiety, and continues to use Testfire, which she feels is effective, especially after taking her shot post-cold.  She does not currently need a refill for famotidine  (Pepcid ).   Otherwise, there have been no changes to her past medical history, surgical history, family history, or social history.    Review of systems otherwise negative other than that mentioned in the HPI.    Objective:   Blood pressure 130/74, pulse 94, temperature 97.7 F (36.5 C), temperature source Temporal, resp. rate 18, height 5' 0.63 (1.54 m), weight 171 lb 11.2 oz (77.9 kg), last menstrual period 09/30/2016,  SpO2 97%. Body mass index is 32.84 kg/m.    Physical Exam Vitals reviewed.  Constitutional:      Appearance: She is well-developed.     Comments: Pleasant. Fascinator in place.   HENT:     Head: Normocephalic and atraumatic.     Right Ear: Tympanic membrane, ear canal and external ear normal.     Left Ear: Tympanic membrane, ear canal and external ear normal.     Nose: No nasal deformity, septal deviation, mucosal edema or rhinorrhea.     Right Turbinates: Enlarged, swollen and pale.     Left Turbinates: Enlarged, swollen and pale.     Right Sinus: No maxillary sinus tenderness or frontal sinus tenderness.     Left Sinus: No maxillary sinus tenderness or frontal sinus tenderness.     Comments: Turbinates are looking better today.    Mouth/Throat:     Mouth: Mucous membranes are not pale and not dry.     Pharynx: Uvula midline.     Comments: Cobblestoning in the posterior oropharynx.  Eyes:     General: Lids are normal. No allergic shiner.       Right eye: No discharge.        Left eye: No discharge.  Conjunctiva/sclera: Conjunctivae normal.     Right eye: Right conjunctiva is not injected. No chemosis.    Left eye: Left conjunctiva is not injected. No chemosis.    Pupils: Pupils are equal, round, and reactive to light.  Cardiovascular:     Rate and Rhythm: Normal rate and regular rhythm.     Heart sounds: Normal heart sounds.  Pulmonary:     Effort: Pulmonary effort is normal. No tachypnea, accessory muscle usage or respiratory distress.     Breath sounds: Normal breath sounds. No wheezing, rhonchi or rales.     Comments: Moving air well in all lung fields.  No increased work of breathing. Chest:     Chest wall: No tenderness.  Lymphadenopathy:     Cervical: No cervical adenopathy.  Skin:    Coloration: Skin is not pale.     Findings: No abrasion, erythema, petechiae or rash. Rash is not papular, urticarial or vesicular.  Neurological:     Mental Status: She is  alert.  Psychiatric:        Behavior: Behavior is cooperative.      Diagnostic studies:    Spirometry: results normal (FEV1: 2.18/91%, FVC: 2.53/84%, FEV1/FVC: 86%).    Spirometry consistent with normal pattern.    Allergy Studies: none       Marty Shaggy, MD  [facility] 

## 2024-08-24 NOTE — Patient Instructions (Addendum)
 1. Moderate persistent asthma without complication - Lung testing looks excellent today.  - There is no harm in remaining in this medication daily, so we will not make any changes.  - Daily controller medication(s): Trelegy 100/62.5/25 one puff once daily and Tezspire  daily - Prior to physical activity: albuterol  2 puffs 10-15 minutes before physical activity. - Rescue medications: albuterol  4 puffs every 4-6 hours as needed - Asthma control goals:  * Full participation in all desired activities (may need albuterol  before activity) * Albuterol  use two time or less a week on average (not counting use with activity) * Cough interfering with sleep two time or less a month * Oral steroids no more than once a year * No hospitalizations  2. Seasonal and perennial allergic rhinitis (trees, weeds, grasses, molds, dust mites, cat, dog and cockroach) - It seems that this is well-controlled with the medications and after the allergy shots.  - Continue with levocetirizine 5 mg daily. - Continue with Allegra daily.  - Continue with Astelin  one spray per nostril twice daily as needed.   3. Gastroesophageal reflux disease - Continue with the use of famotidine  40 mg twice daily.  4. Return in about 1 year (around 08/24/2025). You can have the follow up appointment with a Nurse Practitioner (our Nurse Practitioners are excellent and always have Physician oversight!).    Please inform us  of any Emergency Department visits, hospitalizations, or changes in symptoms. Call us  before going to the ED for breathing or allergy symptoms since we might be able to fit you in for a sick visit. Feel free to contact us  anytime with any questions, problems, or concerns.  It was a pleasure to see you again today!  Websites that have reliable patient information: 1. American Academy of Asthma, Allergy, and Immunology: www.aaaai.org 2. Food Allergy Research and Education (FARE): foodallergy.org 3. Mothers of Asthmatics:  http://www.asthmacommunitynetwork.org 4. American College of Allergy, Asthma, and Immunology: www.acaai.org      "Like" us  on Facebook and Instagram for our latest updates!      A healthy democracy works best when [employer] participate! Make sure you are registered to vote! If you have moved or changed any of your contact information, you will need to get this updated before voting! Scan the QR codes below to learn more!

## 2024-08-29 ENCOUNTER — Other Ambulatory Visit: Payer: Self-pay | Admitting: Allergy & Immunology

## 2024-09-01 DIAGNOSIS — J301 Allergic rhinitis due to pollen: Secondary | ICD-10-CM | POA: Diagnosis not present

## 2024-09-01 DIAGNOSIS — J3089 Other allergic rhinitis: Secondary | ICD-10-CM | POA: Diagnosis not present

## 2024-09-01 DIAGNOSIS — J3081 Allergic rhinitis due to animal (cat) (dog) hair and dander: Secondary | ICD-10-CM | POA: Diagnosis not present

## 2024-09-01 DIAGNOSIS — J302 Other seasonal allergic rhinitis: Secondary | ICD-10-CM | POA: Diagnosis not present

## 2024-09-01 NOTE — Progress Notes (Signed)
 VIALS MADE ON 09/01/24

## 2024-09-23 ENCOUNTER — Other Ambulatory Visit: Payer: Self-pay | Admitting: Family Medicine

## 2024-09-23 DIAGNOSIS — E119 Type 2 diabetes mellitus without complications: Secondary | ICD-10-CM

## 2024-10-10 LAB — HM MAMMOGRAPHY

## 2024-10-11 ENCOUNTER — Ambulatory Visit: Payer: Self-pay | Admitting: Obstetrics and Gynecology

## 2024-10-11 ENCOUNTER — Encounter: Payer: Self-pay | Admitting: Obstetrics and Gynecology

## 2024-10-15 ENCOUNTER — Other Ambulatory Visit: Payer: Self-pay | Admitting: Allergy & Immunology

## 2024-10-20 ENCOUNTER — Encounter: Payer: Self-pay | Admitting: Obstetrics and Gynecology

## 2024-10-20 ENCOUNTER — Ambulatory Visit: Payer: Self-pay | Admitting: Obstetrics and Gynecology

## 2024-12-25 ENCOUNTER — Other Ambulatory Visit: Payer: Self-pay | Admitting: Family Medicine

## 2024-12-25 DIAGNOSIS — E119 Type 2 diabetes mellitus without complications: Secondary | ICD-10-CM

## 2025-01-01 ENCOUNTER — Other Ambulatory Visit: Payer: Self-pay

## 2025-01-02 ENCOUNTER — Other Ambulatory Visit

## 2025-01-03 ENCOUNTER — Other Ambulatory Visit

## 2025-01-03 DIAGNOSIS — Z0000 Encounter for general adult medical examination without abnormal findings: Secondary | ICD-10-CM

## 2025-01-03 DIAGNOSIS — E538 Deficiency of other specified B group vitamins: Secondary | ICD-10-CM

## 2025-01-03 DIAGNOSIS — E119 Type 2 diabetes mellitus without complications: Secondary | ICD-10-CM

## 2025-01-03 DIAGNOSIS — E782 Mixed hyperlipidemia: Secondary | ICD-10-CM

## 2025-01-03 NOTE — Patient Instructions (Incomplete)

## 2025-01-03 NOTE — Progress Notes (Unsigned)
 No chief complaint on file.  Jacqueline Holmes is a 54 y.o. female who presents for a complete physical.  She sees Dr. Nikki for her GYN care. She is scheduled for a visit next week. See below for labs done prior to visit.  Type 2 DM follow-up:  She was diagnosed after her labs from her physical in 12/2023 showed A1c of 7.3 and fasting glucose 133.  She endorsed having more sweets over the holidays, some soda if out to eat.  Eating carbs (rice, pasta) that husband cooked, but keeping portions small. She was restarted on Metformin  XR 1000 mg daily  (had taken in the past for pre-diabetes, not since 2021), and saw a dietician in 01/2024. A1c improved to 6.4% in April 2025, and last was 6.3% in 07/2024. She is tolerating metformin  without side effects.  ***sugars?   She continues to try and limit her carbs, eating better carbs--brown rice, whole grain pasta, continues to eat whole wheat bread. She doesn't drink much soda. She has been walking some, 2-3x/week x 30 minutes. She had diabetic eye exam in 03/2024.    Hyperlipidemia:  She was started on rosuvastatin  10 mg in January, after diagnosis of diabetes. She is tolerating this without side effects. Recheck of lipids in 02/2024 showed LDL 46 (was 138 prior to starting the statin). She eats red meat 1-2x/week.   Post-operative hypothyroidism, h/o Graves disease, s/p thyroidectomy.  She is now under the care of Dr. Dale at [facility] (previously treated by [facility]). Currently she is on [place] thyroid  90 mg in the morning, 45 mg in the afternoon.  She saw Dr. Dale last in 10/2024. TSH was low at <0.050. Free T4 and total T3 were normal. No dose changes were made.  *** She denies changes to hair/skin/nails/moods/weight/energy.  Menopausal symptoms:  She was started on Paxil  to treat hot flashes by Dr. Nikki in February 2025, and dose was increased to 20 mg in 02/2024.  She is doing well on Paxil .  Hot flashes improved, moods are also  better. She denies side effects.  She had tried HRT, stopped related to breast tenderness.  Mammogram was normal in 09/2024. She mentioned her concerns to Dr. Dale at December visit, and they discussed re-trying HRT.  He prescribed estradiol  0.5 mg and progesterone  100 mg daily.  ***  She has intermittent LLQ pain, ongoing for years.  It has been felt to be muscular, as it occurs with certain movements/positions (lifting her left leg, when leaning forward, such as putting on her pants).  This discomfort is sporadic, and less frequent than in the past. Denies nausea, vomiting, change in bowels.  Pain is not related to eating.  B12 deficiency: Diagnosed by her GI (level 211 on 03/02/22), when having complaints of sensitivity and burning sensation of her mouth/tongue.  She had severe itching after the first injection (03/05/22). She noted improvement in energy and focus after this first shot.  She was later switched to oral supplement (1000 mcg daily). We had discussed that since she was no longer on PPI, she may be able to stop the B12 after tongue symptoms resolve, if GI symptoms are adequately managed with famotidine .  If she requires PPI long-term, then oral B12 should be continued. She reported stopping the B12 supplement in July, and requested labs to be rechecked with her physical labs.  See below. She still reports having some tongue issues, related to eating spicy foods only. Denies numbness, tingling, memory concerns.   GERD/gatritis:  She had been on omeprazole  40mg  BID for over 1 year, which has worked well in controlling reflux. EGD 01/2021 showed erosive gastropathy with no bleeding and no stigmata of recent bleeding. Biopsy showed mild chronic gastritis, mild reactive gastropathy.  Negative for H.pylori.  She was then switched to famotidine  40mg  BID before meals after diagnosis of B12 deficiency (in 2023). She reports doing well on the famotidine , infrequent heartburn.   Last episode was  2 weeks ago, relieved by Tums.  *** She is now getting the famotidine  from her allergist.   Vitamin D  level was also checked in 02/2022, level was at the lower end of normal range, at 34.39.  She completed 50,000 IU weekly x 8 weeks, and was taking 1000 IU daily (hadn't been taking any vitamin D3 prior to that check).   Last level was 86.7 in 12/2023, when taking a D3 daily, unsure of the dose (thought it was 50 mcg--2000 IU).  Pulmonary nodule:  noted on CXR 09/2021. F/u CT in 12/2021 was stable (9mm in RUL), rec add'l f/u in 6-12 months. She had chest CT in 12/2022,and last in 01/2024. Due for recheck of CT.  She denies any changes in breathing (no changes to cough, shortness of breath, chest pain). CT 01/2024: IMPRESSION: *Stable 8 mm nodule right upper lobe. *No new findings. Lung-RADS 2, benign appearance or behavior. Continue annual screening with low-dose chest CT without contrast in 12 months.   Asthma and allergies: On immunotherapy, q4 weeks. She takes Allegra in the morning, Xyzal  in the evening. She uses Trelegy every morning, and Tezspire  once a month. She only rarely needs albuterol  (2-3x/year, if sick). Also has Symbicort  for prn use.    Immunization History  Administered Date(s) Administered   Influenza Inj Mdck Quad Pf 10/29/2017   Influenza, Seasonal, Injecte, Preservative Fre 08/23/2024   Influenza,inj,Quad PF,6+ Mos 08/30/2018, 09/14/2019, 09/18/2020   Influenza-Unspecified 09/13/2016, 08/30/2021, 10/01/2023   PFIZER(Purple Top)SARS-COV-2 Vaccination 02/02/2020, 03/01/2020, 09/18/2020   PNEUMOCOCCAL CONJUGATE-20 03/23/2024   Pfizer Covid-19 Vaccine Bivalent Booster 59yrs & up 10/24/2021   Pfizer(Comirnaty)Fall Seasonal Vaccine 12 years and older 10/01/2023   Pneumococcal Polysaccharide-23 05/16/2018   Tdap 05/16/2018   Zoster Recombinant(Shingrix ) 10/01/2023, 03/23/2024   Last Pap smear: 01/2020 with Dr. Nikki, normal, no high risk HPV Last mammogram: 09/2024 Last  colonoscopy: 11/2020 Dr. Eda, showing internal hemorrhoids, sigmoid diverticulosis, 2 polyps in the ascending colon (tubular adenomas), and multiple ulcers (proximal transverse colon, hepatic flexure and in the ascending colon)--biopsy showed mucosa with patchy degenerative glands with hyalinization of lamina propria, suggestive of ischemic colitis. Felt to be due to NSAIDS, which were stopped, and pt put on PPI. Repeat colonoscopy in 7 years. Dr. Eda stated low threshold to pursue CTA and repeat colonoscopy with recurrent abdominal pain Last DEXA: 06/2022 T-1.1 L fem neck, -1.3 L forearm/radius Dentist: 4x/year (periodontal issues) Ophtho: yearly, but past due *** Exercise:   Walks some on campus in nice weather. Exercise is otherwise limited.  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  Lipids: Lab Results  Component Value Date   CHOL 105 03/23/2024   HDL 40 03/23/2024   LDLCALC 46 03/23/2024   TRIG 97 03/23/2024   CHOLHDL 2.6 03/23/2024    PMH, PSH, SH and FH were reviewed and updated      ROS:  The patient denies anorexia, fever, weight changes, headaches,  vision changes, decreased hearing, ear pain, sore throat, breast concerns, chest pain, palpitations, dizziness, syncope, dyspnea on exertion, cough, swelling,  nausea, vomiting, diarrhea, constipation, melena, hematochezia, indigestion/heartburn, hematuria, incontinence, dysuria, vaginal bleeding, discharge, odor or itch, genital lesions,numbness, tingling, weakness, tremor, suspicious skin lesions, depression, anxiety, abnormal bleeding/bruising, or enlarged lymph nodes.  L lateral hip pain after laying on her back. Doesn't hurt to lay on the left side. Rare discomfort at L nipple Urinary frequency --much better after PT Hot flashes  Some bowel urgency, mainly after drinking coffee.  Denies abdominal pain, other than intermittent L-sided pain, positional. Every once in a while will get stomach cramps (similar to  prior IBS), when stressed. Frequent HA's (sinus, tension), often at left posterior/lateral neck. Saw ortho in the past--rx'd muscle relaxers, suggested PT. Improved with muscle relaxers.  Occasionally gets all-over body aches for up to a week, not associated with any illness.  Feels fine now.   PHYSICAL EXAM:  LMP 09/30/2016 Comment: aub 2021  Wt Readings from Last 3 Encounters:  08/24/24 171 lb 11.2 oz (77.9 kg)  08/23/24 172 lb 9.6 oz (78.3 kg)  03/23/24 176 lb 6.4 oz (80 kg)    General Appearance:    Alert, cooperative, no distress, appears stated age. She did not change into gown for today's exam  Head:    Normocephalic, without obvious abnormality, atraumatic  Eyes:    PERRL, conjunctiva/corneas clear, EOM's intact, fundi    benign  Ears:    Normal TM's and external ear canals  Nose:   No drainage or sinus tenderness  Throat:   Lips, mucosa, and tongue normal; teeth and gums normal  Neck:   Supple, no lymphadenopathy;  thyroid :  no enlargement/ tenderness/nodules; no carotid bruit or JVD. Minimal residual cyst noted at L posterior neck.  Back:    Spine nontender, no curvature, ROM normal, no CVA     tenderness  Lungs:     Clear to auscultation bilaterally without wheezes, rales or     ronchi; respirations unlabored  Chest Wall:    No tenderness or deformity.   Heart:    Regular rate and rhythm, S1 and S2 normal, no murmur, rub or gallop  Breast Exam:    Deferred to GYN.   Abdomen:     Soft, nontender, nondistended, normoactive bowel sounds, no masses, no hepatosplenomegaly.   Genitalia:    Deferred to GYN     Extremities:   No clubbing, cyanosis or edema  Pulses:   2+ and symmetric all extremities  Skin:   Skin color, texture, turgor normal, no rashes or lesions. Many tattoos (bilateral ankles, L forearm, back). Skin exam limited as she wasn't in gown  Lymph nodes:   Cervical, supraclavicular, and axillary nodes normal  Neurologic:   CNII-XII intact, normal strength,  sensation and gait; reflexes 2+ and symmetric throughout          Psych:   Normal mood, affect, hygiene and grooming.     ***UPDATE_-residual cyst L post neck?  Lab Results  Component Value Date   HGBA1C 6.3 (A) 08/23/2024   Fasting glucose ***    Chemistry      Component Value Date/Time   NA 142 12/16/2023 0824   K 4.9 12/16/2023 0824   CL 103 12/16/2023 0824   CO2 23 12/16/2023 0824   BUN 22 12/16/2023 0824   CREATININE 0.91 12/16/2023 0824      Component Value Date/Time   CALCIUM  9.4 12/16/2023 0824   ALKPHOS 115 03/23/2024 1006   AST 24 03/23/2024 1006   ALT 36 (H) 03/23/2024 1006   BILITOT 0.5 03/23/2024 1006  Lab Results  Component Value Date   WBC 8.4 12/16/2023   HGB 15.1 12/16/2023   HCT 46.7 (H) 12/16/2023   MCV 87 12/16/2023   PLT 327 12/16/2023   Lab Results  Component Value Date   CHOL 105 03/23/2024   HDL 40 03/23/2024   LDLCALC 46 03/23/2024   TRIG 97 03/23/2024   CHOLHDL 2.6 03/23/2024   Lab Results  Component Value Date   VITAMINB12 >2000 (H) 12/16/2023     ASSESSMENT/PLAN:  Needs low-dose chest CT without contrast in 12 months to f/u pulm nodule (last done 01/2024, due now)  Any thyroid  dose change by endo? He started her back on HRT (prev rx'd by GYN)  Discussed monthly self breast exams and yearly mammograms; at least 30 minutes of aerobic activity at least 5 days/week, weight-bearing exercise at least 2x/week; proper sunscreen use reviewed; healthy diet, including goals of calcium  and vitamin D  intake and alcohol recommendations (less than or equal to 1 drink/day) reviewed; regular seatbelt use; changing batteries in smoke detectors.  Immunization recommendations discussed--continue yearly flu shots.   COVID booster *** Colonoscopy recommendations reviewed, UTD.  F/u 1 year, sooner prn.

## 2025-01-04 ENCOUNTER — Ambulatory Visit: Payer: 59 | Admitting: Family Medicine

## 2025-01-04 ENCOUNTER — Encounter: Payer: Self-pay | Admitting: Family Medicine

## 2025-01-04 VITALS — BP 130/82 | HR 72 | Ht 61.5 in | Wt 176.0 lb

## 2025-01-04 DIAGNOSIS — Z0000 Encounter for general adult medical examination without abnormal findings: Secondary | ICD-10-CM

## 2025-01-04 DIAGNOSIS — J4530 Mild persistent asthma, uncomplicated: Secondary | ICD-10-CM | POA: Diagnosis not present

## 2025-01-04 DIAGNOSIS — K219 Gastro-esophageal reflux disease without esophagitis: Secondary | ICD-10-CM | POA: Diagnosis not present

## 2025-01-04 DIAGNOSIS — E559 Vitamin D deficiency, unspecified: Secondary | ICD-10-CM

## 2025-01-04 DIAGNOSIS — J3089 Other allergic rhinitis: Secondary | ICD-10-CM

## 2025-01-04 DIAGNOSIS — R911 Solitary pulmonary nodule: Secondary | ICD-10-CM | POA: Diagnosis not present

## 2025-01-04 DIAGNOSIS — E538 Deficiency of other specified B group vitamins: Secondary | ICD-10-CM | POA: Diagnosis not present

## 2025-01-04 DIAGNOSIS — Z7984 Long term (current) use of oral hypoglycemic drugs: Secondary | ICD-10-CM | POA: Diagnosis not present

## 2025-01-04 DIAGNOSIS — J302 Other seasonal allergic rhinitis: Secondary | ICD-10-CM

## 2025-01-04 DIAGNOSIS — Z23 Encounter for immunization: Secondary | ICD-10-CM | POA: Diagnosis not present

## 2025-01-04 DIAGNOSIS — E119 Type 2 diabetes mellitus without complications: Secondary | ICD-10-CM | POA: Diagnosis not present

## 2025-01-04 DIAGNOSIS — E890 Postprocedural hypothyroidism: Secondary | ICD-10-CM | POA: Diagnosis not present

## 2025-01-04 MED ORDER — CLINDAMYCIN PHOSPHATE 1 % EX LOTN: TOPICAL_LOTION | Freq: Two times a day (BID) | CUTANEOUS | 1 refills | Status: AC

## 2025-01-05 ENCOUNTER — Ambulatory Visit: Payer: Self-pay | Admitting: Family Medicine

## 2025-01-05 LAB — COMPREHENSIVE METABOLIC PANEL WITH GFR
AST: 22 [IU]/L (ref 0–40)
Albumin: 4.2 g/dL (ref 3.8–4.9)
Alkaline Phosphatase: 93 [IU]/L (ref 49–135)
BUN/Creatinine Ratio: 26 — ABNORMAL HIGH (ref 9–23)
BUN: 21 mg/dL (ref 6–24)
Bilirubin Total: 0.4 mg/dL (ref 0.0–1.2)
CO2: 18 mmol/L — ABNORMAL LOW (ref 20–29)
Calcium: 9.4 mg/dL (ref 8.7–10.2)
Chloride: 103 mmol/L (ref 96–106)
Creatinine, Ser: 0.81 mg/dL (ref 0.57–1.00)
Globulin, Total: 3.1 g/dL (ref 1.5–4.5)
Glucose: 126 mg/dL — ABNORMAL HIGH (ref 70–99)
Potassium: 4.5 mmol/L (ref 3.5–5.2)
Sodium: 139 mmol/L (ref 134–144)
Total Protein: 7.3 g/dL (ref 6.0–8.5)
eGFR: 86 mL/min/{1.73_m2}

## 2025-01-05 LAB — CBC WITH DIFFERENTIAL/PLATELET
Basophils Absolute: 0.1 10*3/uL (ref 0.0–0.2)
Basos: 1 %
EOS (ABSOLUTE): 0.1 10*3/uL (ref 0.0–0.4)
Eos: 1 %
Hematocrit: 48.6 % — ABNORMAL HIGH (ref 34.0–46.6)
Hemoglobin: 15.7 g/dL (ref 11.1–15.9)
Immature Grans (Abs): 0 10*3/uL (ref 0.0–0.1)
Immature Granulocytes: 0 %
Lymphocytes Absolute: 2.9 10*3/uL (ref 0.7–3.1)
Lymphs: 31 %
MCH: 29 pg (ref 26.6–33.0)
MCHC: 32.3 g/dL (ref 31.5–35.7)
MCV: 90 fL (ref 79–97)
Monocytes Absolute: 0.6 10*3/uL (ref 0.1–0.9)
Monocytes: 7 %
Neutrophils Absolute: 5.8 10*3/uL (ref 1.4–7.0)
Neutrophils: 60 %
Platelets: 347 10*3/uL (ref 150–450)
RBC: 5.41 x10E6/uL — ABNORMAL HIGH (ref 3.77–5.28)
RDW: 12.8 % (ref 11.7–15.4)
WBC: 9.5 10*3/uL (ref 3.4–10.8)

## 2025-01-05 LAB — MICROALBUMIN / CREATININE URINE RATIO
Creatinine, Urine: 145.6 mg/dL
Microalb/Creat Ratio: 10 mg/g{creat} (ref 0–29)
Microalbumin, Urine: 15 ug/mL

## 2025-01-05 LAB — VITAMIN B12: Vitamin B-12: 634 pg/mL (ref 232–1245)

## 2025-01-05 LAB — LIPID PANEL
Chol/HDL Ratio: 2.3 ratio (ref 0.0–4.4)
Cholesterol, Total: 117 mg/dL (ref 100–199)
HDL: 52 mg/dL
LDL Chol Calc (NIH): 44 mg/dL (ref 0–99)
Triglycerides: 114 mg/dL (ref 0–149)
VLDL Cholesterol Cal: 21 mg/dL (ref 5–40)

## 2025-01-05 LAB — HEMOGLOBIN A1C
Est. average glucose Bld gHb Est-mCnc: 140 mg/dL
Hgb A1c MFr Bld: 6.5 % — ABNORMAL HIGH (ref 4.8–5.6)

## 2025-01-09 ENCOUNTER — Other Ambulatory Visit

## 2025-01-11 ENCOUNTER — Ambulatory Visit: Payer: 59 | Admitting: Obstetrics and Gynecology

## 2025-07-05 ENCOUNTER — Ambulatory Visit: Admitting: Family Medicine

## 2025-08-28 ENCOUNTER — Ambulatory Visit: Admitting: Allergy & Immunology

## 2026-01-17 ENCOUNTER — Encounter: Admitting: Family Medicine
